# Patient Record
Sex: Female | Born: 1985 | Race: Black or African American | Hispanic: No | Marital: Single | Smoking: Never smoker
Health system: Southern US, Community
[De-identification: ages and names within clinical notes are randomized; demographics above are authoritative.]

## PROBLEM LIST (undated history)

## (undated) ENCOUNTER — Inpatient Hospital Stay (HOSPITAL_COMMUNITY): Payer: Self-pay

## (undated) DIAGNOSIS — R45851 Suicidal ideations: Secondary | ICD-10-CM

## (undated) DIAGNOSIS — T4145XA Adverse effect of unspecified anesthetic, initial encounter: Secondary | ICD-10-CM

## (undated) DIAGNOSIS — Z86718 Personal history of other venous thrombosis and embolism: Secondary | ICD-10-CM

## (undated) DIAGNOSIS — Z9289 Personal history of other medical treatment: Secondary | ICD-10-CM

## (undated) DIAGNOSIS — O139 Gestational [pregnancy-induced] hypertension without significant proteinuria, unspecified trimester: Secondary | ICD-10-CM

## (undated) DIAGNOSIS — N921 Excessive and frequent menstruation with irregular cycle: Secondary | ICD-10-CM

## (undated) DIAGNOSIS — R569 Unspecified convulsions: Secondary | ICD-10-CM

## (undated) DIAGNOSIS — G43909 Migraine, unspecified, not intractable, without status migrainosus: Secondary | ICD-10-CM

## (undated) DIAGNOSIS — N390 Urinary tract infection, site not specified: Secondary | ICD-10-CM

## (undated) DIAGNOSIS — R51 Headache: Secondary | ICD-10-CM

## (undated) DIAGNOSIS — F419 Anxiety disorder, unspecified: Secondary | ICD-10-CM

## (undated) DIAGNOSIS — O1490 Unspecified pre-eclampsia, unspecified trimester: Secondary | ICD-10-CM

## (undated) DIAGNOSIS — D649 Anemia, unspecified: Secondary | ICD-10-CM

## (undated) DIAGNOSIS — B009 Herpesviral infection, unspecified: Secondary | ICD-10-CM

## (undated) DIAGNOSIS — F329 Major depressive disorder, single episode, unspecified: Secondary | ICD-10-CM

## (undated) DIAGNOSIS — N12 Tubulo-interstitial nephritis, not specified as acute or chronic: Secondary | ICD-10-CM

## (undated) DIAGNOSIS — O159 Eclampsia, unspecified as to time period: Secondary | ICD-10-CM

## (undated) DIAGNOSIS — A6000 Herpesviral infection of urogenital system, unspecified: Secondary | ICD-10-CM

## (undated) DIAGNOSIS — F3181 Bipolar II disorder: Secondary | ICD-10-CM

## (undated) DIAGNOSIS — F32A Depression, unspecified: Secondary | ICD-10-CM

## (undated) DIAGNOSIS — D219 Benign neoplasm of connective and other soft tissue, unspecified: Secondary | ICD-10-CM

## (undated) DIAGNOSIS — Z86711 Personal history of pulmonary embolism: Secondary | ICD-10-CM

## (undated) HISTORY — DX: Personal history of pulmonary embolism: Z86.711

## (undated) HISTORY — DX: Unspecified pre-eclampsia, unspecified trimester: O14.90

## (undated) HISTORY — DX: Excessive and frequent menstruation with irregular cycle: N92.1

## (undated) HISTORY — DX: Bipolar II disorder: F31.81

## (undated) HISTORY — DX: Suicidal ideations: R45.851

---
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
## 2010-01-05 HISTORY — PX: INDUCED ABORTION: SHX677

## 2010-02-27 ENCOUNTER — Inpatient Hospital Stay (HOSPITAL_COMMUNITY): Admission: EM | Admit: 2010-02-27 | Discharge: 2010-03-02 | Payer: Self-pay | Admitting: Emergency Medicine

## 2010-02-27 ENCOUNTER — Ambulatory Visit: Payer: Self-pay | Admitting: Family Medicine

## 2010-03-03 ENCOUNTER — Ambulatory Visit: Payer: Self-pay | Admitting: Family Medicine

## 2010-03-03 DIAGNOSIS — I2699 Other pulmonary embolism without acute cor pulmonale: Secondary | ICD-10-CM | POA: Insufficient documentation

## 2010-03-04 ENCOUNTER — Ambulatory Visit: Payer: Self-pay | Admitting: Family Medicine

## 2010-03-04 DIAGNOSIS — O021 Missed abortion: Secondary | ICD-10-CM

## 2010-03-04 DIAGNOSIS — J45909 Unspecified asthma, uncomplicated: Secondary | ICD-10-CM | POA: Insufficient documentation

## 2010-03-04 LAB — CONVERTED CEMR LAB: INR: 1.4

## 2010-03-05 ENCOUNTER — Emergency Department (HOSPITAL_COMMUNITY): Admission: EM | Admit: 2010-03-05 | Discharge: 2010-03-05 | Payer: Self-pay | Admitting: Emergency Medicine

## 2010-03-05 ENCOUNTER — Telehealth: Payer: Self-pay | Admitting: *Deleted

## 2010-03-08 ENCOUNTER — Ambulatory Visit: Payer: Self-pay | Admitting: Family Medicine

## 2010-03-08 ENCOUNTER — Encounter: Payer: Self-pay | Admitting: Family Medicine

## 2010-03-08 LAB — CONVERTED CEMR LAB
INR: 2.1
hCG, Beta Chain, Quant, S: 139.1 milliintl units/mL

## 2010-03-12 ENCOUNTER — Ambulatory Visit: Payer: Self-pay | Admitting: Family Medicine

## 2010-03-15 ENCOUNTER — Ambulatory Visit: Payer: Self-pay | Admitting: Family Medicine

## 2010-03-15 ENCOUNTER — Encounter: Payer: Self-pay | Admitting: Family Medicine

## 2010-03-15 LAB — CONVERTED CEMR LAB: INR: 2.3

## 2010-03-18 ENCOUNTER — Telehealth: Payer: Self-pay | Admitting: Family Medicine

## 2010-04-01 ENCOUNTER — Telehealth: Payer: Self-pay | Admitting: *Deleted

## 2010-04-05 ENCOUNTER — Ambulatory Visit: Payer: Self-pay | Admitting: Family Medicine

## 2010-04-13 ENCOUNTER — Encounter: Payer: Self-pay | Admitting: *Deleted

## 2010-04-14 ENCOUNTER — Telehealth: Payer: Self-pay | Admitting: Family Medicine

## 2010-05-04 ENCOUNTER — Encounter: Payer: Self-pay | Admitting: Family Medicine

## 2010-05-04 ENCOUNTER — Ambulatory Visit: Payer: Self-pay | Admitting: Family Medicine

## 2010-05-04 LAB — CONVERTED CEMR LAB
INR: 1.2
hCG, Beta Chain, Quant, S: <2 milliintl units/mL

## 2010-05-17 DIAGNOSIS — W57XXXA Bitten or stung by nonvenomous insect and other nonvenomous arthropods, initial encounter: Secondary | ICD-10-CM | POA: Insufficient documentation

## 2010-05-17 DIAGNOSIS — S60469A Insect bite (nonvenomous) of unspecified finger, initial encounter: Secondary | ICD-10-CM

## 2010-08-01 ENCOUNTER — Emergency Department (HOSPITAL_COMMUNITY): Admission: EM | Admit: 2010-08-01 | Discharge: 2010-08-01 | Payer: Self-pay | Admitting: Family Medicine

## 2010-08-04 ENCOUNTER — Telehealth: Payer: Self-pay | Admitting: Family Medicine

## 2010-08-04 ENCOUNTER — Ambulatory Visit: Payer: Self-pay | Admitting: Family Medicine

## 2010-08-04 DIAGNOSIS — L03818 Cellulitis of other sites: Secondary | ICD-10-CM

## 2010-08-04 DIAGNOSIS — L02818 Cutaneous abscess of other sites: Secondary | ICD-10-CM

## 2010-08-04 LAB — CONVERTED CEMR LAB: INR: 1.1

## 2010-08-18 ENCOUNTER — Telehealth: Payer: Self-pay | Admitting: *Deleted

## 2010-09-02 ENCOUNTER — Encounter: Payer: Self-pay | Admitting: Family Medicine

## 2010-12-05 DIAGNOSIS — N12 Tubulo-interstitial nephritis, not specified as acute or chronic: Secondary | ICD-10-CM

## 2010-12-05 HISTORY — DX: Tubulo-interstitial nephritis, not specified as acute or chronic: N12

## 2011-01-04 NOTE — Letter (Signed)
Summary: Probation Letter  [facility]  [address]   [place], [place] [address]   Phone: [phone]  Fax: [phone]    04/13/2010  Paula Benson [address] [place], [place]  [address]  Dear Ms. Hanback,  With the goal of better serving all our patients the [facility] is following each patient's missed appointments.  You have missed at least 3 appointments with our practice.If you cannot keep your appointment, we expect you to call at least 24 hours before your appointment time.  Missing appointments prevents other patients from seeing [place] and makes it difficult to provide you with the best possible medical care.      1.   If you miss one more appointment, we will only give you limited medical services. This means we will not call in medication refills, complete a form, or make a referral for you except when you are here for a scheduled office visit.    2.   If you miss 2 or more appointments in the next year, we will dismiss you from our practice.    Our office staff can be reached at [phone] Monday through Friday from 8:30 a.m.-5:00 p.m. and will be glad to schedule your appointment as necessary.    Thank you.   [facility]  Appended Document: Probation Letter Letter returned unclaimed.

## 2011-01-04 NOTE — Assessment & Plan Note (Signed)
Summary: f/i coumadin,df   Vital Signs:  Patient profile:   25 year old female Weight:      140.5 pounds Pulse rate:   80 / minute BP sitting:   104 / 64  (left arm)  Vitals Entered By: Arlyss Repress CMA, (May 04, 2010 2:37 PM) CC: f/up spider bite. refill meds. Is Patient Diabetic? No Pain Assessment Patient in pain? no        Primary Care Provider:  Clementeen Graham MD   CC:  f/up spider bite. refill meds..  History of Present Illness: 25 year old AAF:  1. Spider Bite: > 1 weeks ago, right index finger, went to [facility] - they I&D'd abscess, Rx Doxy, now much better. Denies fever/chills, HA, dizziness, N/V/D, LE edema, swelling/pain in finger, numbness/tingling fingers/hand.  2. Hx PE/Coumadin: Hx of 3 PEs associated with pregnancy, not compliant with coumadin or INR checks, she states that she has been taking her coumadin daily (12.5 mg). She denies CP, SOB, LE edema/pain. She has not been on Lovenox for several weeks.  3. Missed Abortion: s/p elective abortion in February 2011. B-hCG on 03/12/10 = 41.6. Plan - to monitor until < 5. If plateaus, will need D&C. Patient denies vaginal bleeding. Not sexually active now.  Habits & Providers  Alcohol-Tobacco-Diet     Tobacco Status: never  Current Medications (verified): 1)  Lovenox 100 Mg/ml Soln (Enoxaparin Sodium) .Marland Kitchen.. 100 Mg Subcutaneously Qday 2)  Coumadin 5 Mg Tabs (Warfarin Sodium) .Marland Kitchen.. 10 Mg By Mouth Qday 3)  Ventolin Hfa 108 (90 Base) Mcg/act Aers (Albuterol Sulfate) .... 2 Puffs Q 4 Hrs As Needed Dyspnea 4)  Ibuprofen 800 Mg Tabs (Ibuprofen) .... One Tab By Mouth Three Times A Day As Needed Pain  Allergies (verified): No Known Drug Allergies  Past History:  Past medical, surgical, family and social histories (including risk factors) reviewed, and no changes noted (except as noted below).  Past Medical History: Reviewed history from 03/12/2010 and no changes required. - G3P2-0-1-2.  - Asthma.  - Preeclampsia with her  first pregnancy.  - Pulmonary emboli x 3, latest 02/2010.   Past Surgical History: Reviewed history from 03/04/2010 and no changes required. - Cesarean section in January 2003 and February 2009.  - Elective abortion February 2011  Family History: Reviewed history from 03/04/2010 and no changes required. M = DM2 F = in good health Sister = Anemia  No family history of clotting d/o per patient   Social History: Reviewed history from 03/04/2010 and no changes required. Student. Plans on staying her in [place] (originally living in [place]). 2 daughters.   Review of Systems      See HPI  Physical Exam  General:  Well-developed, well-nourished, in no acute distress; alert, appropriate and cooperative throughout examination. Vitals reviewed. Lungs:  Normal respiratory effort, chest expands symmetrically. Lungs are clear to auscultation, no crackles or wheezes. Heart:  Normal rate and regular rhythm. S1 and S2 normal without gallop, murmur, click, rub or other extra sounds. Pulses:  R and L dorsalis pedis and posterior tibial pulses are full and equal bilaterally. Extremities:  No edema. Skin:  Nodule (< 1 cm) palpated on right index finger. No ttp. No edema or erythema. No fluctuance.   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 Improving.  Orders: [facility]- Est  Level 4 ([address])  Problem # 2:  COUMADIN THERAPY (ICD-V58.61) Assessment: Unchanged Reviewed INR and adjusted regimen (see  below). Discussed importance of management. Recommended discussing contraception with PCP. She is not sexually active now. Orders: INR/PT-[facility] (52841) [facility]- Est  Level 4 ([address]40)  Problem # 3:  ABORTION, MISSED (ICD-632) Assessment: Unchanged  Check BHCG.  Orders: [facility]- Est  Level 4 ([address])  Complete Medication List: 1)  Lovenox 100 Mg/ml Soln (Enoxaparin sodium) .Marland Kitchen.. 100 mg subcutaneously qday 2)  Coumadin 5 Mg  Tabs (Warfarin sodium) .Marland Kitchen.. 10 mg by mouth qday 3)  Ventolin Hfa 108 (90 Base) Mcg/act Aers (Albuterol sulfate) .... 2 puffs q 4 hrs as needed dyspnea 4)  Ibuprofen 800 Mg Tabs (Ibuprofen) .... One tab by mouth three times a day as needed pain  Patient Instructions: 1)  It was nice to meet you today! Prescriptions: VENTOLIN HFA 108 (90 BASE) MCG/ACT AERS (ALBUTEROL SULFATE) 2 puffs q 4 hrs as needed dyspnea  #1 x 3   Entered and Authorized by:   Helane Rima DO   Signed by:   Helane Rima DO on 05/04/2010   Method used:   [facility]  [address] [phone]* (retail)       [address]       [place], [place]  [address]       Ph: [phone] or [phone]       Fax: [phone]   RxID:   [id number] COUMADIN 5 MG TABS (WARFARIN SODIUM) 10 mg by mouth qday  #90 x 0   Entered and Authorized by:   Helane Rima DO   Signed by:   Helane Rima DO on 05/04/2010   Method used:   [facility]        [facility]  [address] [phone]* (retail)       [address]       [place], [place]  [address]       Ph: [phone] or [phone]       Fax: [phone]   RxID:   [id number]    ANTICOAGULATION RECORD PREVIOUS REGIMEN & LAB RESULTS Anticoagulation Diagnosis:  Pulmonary Embolism on  03/03/2010 Previous INR Goal Range:  2-3 on  03/03/2010 Previous INR:  1.1 on  04/05/2010 Previous Coumadin Dose(mg):  5mg  tablet on  03/03/2010 Previous Regimen:  12.5mg  M,W,F; 10mg  OTHER DAYS on  04/05/2010 Previous Coagulation Comments:  Pt states she was only taking 10mg  M,W,F; 5mg  other days on  04/05/2010  NEW REGIMEN & LAB RESULTS Current INR: 1.2 Regimen: 12.5 mg daily  Provider: Earlene Plater Repeat testing in: 1 week  05-11-10 Other Comments: ...............test performed by......Marland KitchenBonnie A. [place], MLS (ASCP)cm   Dose has been reviewed with patient or caretaker during this visit. Reviewed by: Dr.  Earlene Plater  Anticoagulation Visit Questionnaire Coumadin dose missed/changed:  Yes Coumadin Dose Comments:  missed 1 dose 2 weeks ago Abnormal Bleeding Symptoms:  No  Any diet changes including alcohol intake, vegetables or greens since the last visit:  No Any illnesses or hospitalizations since the last visit:  Yes      Recent Illness/Hospitalizations:  had spider bite, went to [facility] & was given Doxycycline starting 04-24-10 Any signs of clotting since the last visit (including chest discomfort, dizziness, shortness of breath, arm tingling, slurred speech, swelling or redness in leg):  No  MEDICATIONS LOVENOX 100 MG/ML SOLN (ENOXAPARIN SODIUM) 100 mg Subcutaneously qday COUMADIN 5 MG TABS (WARFARIN SODIUM) 10 mg by mouth qday VENTOLIN  HFA 108 (90 BASE) MCG/ACT AERS (ALBUTEROL SULFATE) 2 puffs q 4 hrs as needed dyspnea IBUPROFEN 800 MG TABS (IBUPROFEN) one tab by mouth three times a day as needed pain    Prevention & Chronic Care Immunizations   Influenza vaccine: Not documented   Influenza vaccine deferral: Not indicated  (05/04/2010)    Tetanus booster: Not documented    Pneumococcal vaccine: Not documented  Other Screening   Pap smear: Not documented   Smoking status: never  (05/04/2010)  Appended Document: f/i coumadin,df     Allergies: No Known Drug Allergies   Impression & Recommendations:  Problem # 1:  FINGER INSECT BITE NONVENOMOUS W/O MENTION INF (ICD-915.4)  Complete Medication List: 1)  Lovenox 100 Mg/ml Soln (Enoxaparin sodium) .Marland Kitchen.. 100 mg subcutaneously qday 2)  Coumadin 5 Mg Tabs (Warfarin sodium) .Marland Kitchen.. 10 mg by mouth qday 3)  Ventolin Hfa 108 (90 Base) Mcg/act Aers (Albuterol sulfate) .... 2 puffs q 4 hrs as needed dyspnea 4)  Ibuprofen 800 Mg Tabs (Ibuprofen) .... One tab by mouth three times a day as needed pain

## 2011-01-04 NOTE — Assessment & Plan Note (Signed)
Summary: f/u boil on ear,df   Vital Signs:  Patient profile:   25 year old female Height:      63 inches Weight:      142.2 pounds BMI:     25.28 Temp:     98.7 degrees F Pulse rate:   83 / minute BP sitting:   117 / 83  (right arm)  Vitals Entered By: Tessie Fass CMA (August 04, 2010 11:34 AM) CC: f/u boil Is Patient Diabetic? No Pain Assessment Patient in pain? no        Primary Care Provider:  Clementeen Graham MD   CC:  f/u boil.  History of Present Illness: 25 year old AAF:  1. Abscess on [place]: a few days ago, left, went to [facility] - they I&D'd abscess, Rx Bactrim, now much better. Denies fever/chills, HA, dizziness, N/V/D, neck pain, lymphadenopathy.  2. Hx PE/Coumadin: Hx of 3 PEs associated with pregnancy, not compliant with coumadin or INR checks, she states that she has been taking her coumadin daily (12.5 mg). She denies CP, SOB, LE edema/pain.    Habits & Providers  Alcohol-Tobacco-Diet     Tobacco Status: never  Current Medications (verified): 1)  Coumadin 5 Mg Tabs (Warfarin Sodium) .Marland Kitchen.. 10 Mg By Mouth Qday 2)  Ventolin Hfa 108 (90 Base) Mcg/act Aers (Albuterol Sulfate) .... 2 Puffs Q 4 Hrs As Needed Dyspnea 3)  Ibuprofen 800 Mg Tabs (Ibuprofen) .... One Tab By Mouth Three Times A Day As Needed Pain 4)  Diflucan 100 Mg Tabs (Fluconazole) .... One By Mouth X 1  Allergies (verified): No Known Drug Allergies  Past History:  Past Medical History: - G3P2-0-1-2.  - Asthma.  - Preeclampsia with her first pregnancy.  - Pulmonary emboli x 3, latest 02/2010.     - NONCOMPLIANT WITH INR CHECKS, MUST LIMIT REFILLS PMH-FH-SH reviewed-no changes except otherwise noted  Review of Systems      See HPI  Physical Exam  General:  Well-developed, well-nourished, in no acute distress; alert, appropriate and cooperative throughout examination. Vitals reviewed. Skin:  Left Ear: healing abscess on left ear lobule - no nodule or fluctuation palpated, nontender, no  drainage, no streaks, no lymphadenopathy.  Psych:  Oriented X3, memory intact for recent and remote, normally interactive, good eye contact, not anxious appearing, and not depressed appearing.     Impression & Recommendations:  Problem # 1:  CELLULITIS AND ABSCESS OF OTHER SPECIFIED SITE [phone]) Assessment Improved  Left ear lobule. Healing well. Finish Abx.  Orders: [facility]- Est  Level 4 (04540)  Problem # 2:  COUMADIN THERAPY (ICD-V58.61) Assessment: Deteriorated Discussed vital importance of regular INR checks at length. INR = 1.1. Patient admitted that she has not been taking coumadin x 1 week, but showed me her bottle (with about 20 more pills inside). Samples given for Lovenox (100 mg) daily x 6 days, instructions to start coumadin 10 mg daily until she comes back on Tuesday (6 days, patient previously on 10 mg po daily and still not at goal but now on Abx). I did NOT give a refill of coumadin and explained that she must prove that she can f/u. Will recheck INR next Tuesday (clinic closed Monday). She will follow up with PCP in 3 weeks.  Orders: [facility]R/PT-[facility] (98119) [facility]- Est  Level 4 ([address]782)  Complete Medication List: 1)  Coumadin 5 Mg Tabs (Warfarin sodium) .Marland Kitchen.. 10 mg by mouth qday 2)  Ventolin Hfa 108 (90 Base) Mcg/act Aers (Albuterol sulfate) .... 2  puffs q 4 hrs as needed dyspnea 3)  Ibuprofen 800 Mg Tabs (Ibuprofen) .... One tab by mouth three times a day as needed pain 4)  Diflucan 100 Mg Tabs (Fluconazole) .... One by mouth x 1 5)  Lovenox 40 Mg/0.70ml Soln (Enoxaparin sodium) .... 1.5 mg/kg x 5 days  Patient Instructions: 1)  It was nice to see you today! 2)  You MUST follow up for your COUMADIN CHECKS!  3)  Your ear is healing well. Continue the antibiotic until it is gone. You may take the diflucan afterwards if you have vaginal itching. 4)  Follow up in 2 WEEKS with your PCP and even sooner if we need to recheck your coumadin level. Prescriptions: DIFLUCAN 100 MG TABS  (FLUCONAZOLE) one by mouth x 1  #1 x 0   Entered and Authorized by:   Helane Rima DO   Signed by:   Helane Rima DO on 08/04/2010   Method used:   [facility]        [facility]  [address] [phone]* (retail)       [address]       [place], [place]  [address]       Ph: [phone] or [phone]       Fax: [phone]   RxID:   [id number]    ANTICOAGULATION RECORD PREVIOUS REGIMEN & LAB RESULTS Anticoagulation Diagnosis:  Pulmonary Embolism on  03/03/2010 Previous INR Goal Range:  2-3 on  03/03/2010 Previous INR:  1.2 on  05/04/2010 Previous Coumadin Dose(mg):  5mg  tablet on  03/03/2010 Previous Regimen:  12.5 mg daily on  05/04/2010 Previous Coagulation Comments:  Pt states she was only taking 10mg  M,W,F; 5mg  other days on  04/05/2010  NEW REGIMEN & LAB RESULTS Current INR: 1.1 Regimen: 10 mg daily + Lovenox   Provider: Earlene Plater Repeat testing in: Tues  08-10-10 Other Comments: ...............test performed by......Marland KitchenBonnie A. [place], MLS (ASCP)cm   Dose has been reviewed with patient or caretaker during this visit. Reviewed by: Dr. Earlene Plater  Anticoagulation Visit Questionnaire Coumadin dose missed/changed:  Yes Coumadin Dose Comments:  pt admits to not taking warfarin for the past week Abnormal Bleeding Symptoms:  No  Any diet changes including alcohol intake, vegetables or greens since the last visit:  No Any illnesses or hospitalizations since the last visit:  No Any signs of clotting since the last visit (including chest discomfort, dizziness, shortness of breath, arm tingling, slurred speech, swelling or redness in leg):  No  MEDICATIONS COUMADIN 5 MG TABS (WARFARIN SODIUM) 10 mg by mouth qday VENTOLIN HFA 108 (90 BASE) MCG/ACT AERS (ALBUTEROL SULFATE) 2 puffs q 4 hrs as needed dyspnea IBUPROFEN 800 MG TABS (IBUPROFEN) one tab by mouth three times a day as needed pain DIFLUCAN 100 MG TABS (FLUCONAZOLE) one by mouth x  1 LOVENOX 40 MG/0.4ML SOLN (ENOXAPARIN SODIUM) 1.5 mg/kg x 5 days

## 2011-01-04 NOTE — Progress Notes (Signed)
Summary: phn msg  Phone Note Call from Patient Call back at Home Phone [phone]   Caller: Patient Summary of Call: needs note faxed to [facility] - [phone] stating the length of break Initial call taken by: De Nurse,  August 04, 2010 2:06 PM

## 2011-01-04 NOTE — Progress Notes (Signed)
Summary: SEE MESSAGE/TS  Phone Note Call from Patient Call back at Home Phone [phone]   Caller: Patient Summary of Call: pt lost the pill for her yeast inf - can another one be called in for DIFLUCAN 100 MG TABS  [facility] [address] [facility] Initial call taken by: De Nurse,  August 18, 2010 8:51 AM  Follow-up for Phone Call        Please call patient to lether know that I resent the Diflucan. HOWEVER, SHE MUST FOLLOW UP WITH HER PCP AND SHOW UP FOR COUMADIN CHECKS. IF SHE CONTINUES TO DNKA, SHE WILL BE DISCHARGED FROM THE PRACTICE. Follow-up by: Helane Rima DO,  August 18, 2010 9:00 AM  Additional Follow-up for Phone Call Additional follow up Details #1::        CALLED PT AND LMVM TO CALL BACK. Additional Follow-up by: Arlyss Repress CMA,,  August 18, 2010 3:45 PM    Additional Follow-up for Phone Call Additional follow up Details #2::    pt has appt 9-26 with dr.corey and also lab appt today. Follow-up by: Arlyss Repress CMA,,  August 19, 2010 11:40 AM  Prescriptions: DIFLUCAN 100 MG TABS (FLUCONAZOLE) one by mouth x 1  #1 x 0   Entered and Authorized by:   Helane Rima DO   Signed by:   Helane Rima DO on 08/18/2010   Method used:   [facility]        [facility]  [address] [phone]* (retail)       [address]       [place], [place]  [address]       Ph: [phone] or [phone]       Fax: [phone]   RxID:   [id number]

## 2011-01-04 NOTE — Progress Notes (Signed)
Summary: refill  Phone Note Refill Request Call back at Home Phone [phone] Message from:  Patient  Refills Requested: Medication #1:  COUMADIN 5 MG TABS 10 mg by mouth qday [facility] [address]   Next Appointment Scheduled: 05/04/10 Initial call taken by: De Nurse,  Apr 14, 2010 9:07 AM  Follow-up for Phone Call        Paula Benson needs to come to clinic for INR checks.  No medications until she calls and keeps a lab check. Follow-up by: Clementeen Graham MD,  Apr 15, 2010 3:23 PM

## 2011-01-04 NOTE — Progress Notes (Signed)
Summary: refill  Phone Note Refill Request Call back at Home Phone [phone] Message from:  Patient  Refills Requested: Medication #1:  COUMADIN 5 MG TABS 10 mg by mouth qday [facility] [address]  Initial call taken by: De Nurse,  April 01, 2010 3:41 PM  Follow-up for Phone Call       Follow-up by: Golden Circle RN,  April 01, 2010 3:44 PM    Prescriptions: COUMADIN 5 MG TABS (WARFARIN SODIUM) 10 mg by mouth qday  #30 x 0   Entered by:   Golden Circle RN   Authorized by:   Clementeen Graham MD   Signed by:   Golden Circle RN on 04/01/2010   Method used:   [facility]ronically to        [facility] [phone]* (retail)       [address]       [place], [place]  [address]       Ph: [phone] or [phone]       Fax: [phone]   RxID:   [id number]

## 2011-01-04 NOTE — Assessment & Plan Note (Signed)
Summary: F/U PE/ CHECK PT/DSL   Vital Signs:  Patient profile:   25 year old female Weight:      150 pounds Temp:     98.4 degrees F Pulse rate:   75 / minute BP sitting:   123 / 77  Vitals Entered By: Jone Baseman CMA (March 04, 2010 2:03 PM) CC: HFU/PTINR Is Patient Diabetic? No Pain Assessment Patient in pain? yes     Location: chest Intensity: 7   Primary Care Provider:  Clementeen Graham MD   CC:  HFU/PTINR.  History of Present Illness:  56 F recent admission to [facility] for PE here for follow up  1) Pulmonary Embolus: Presented to [facility] w/ chest pain, dyspnea, found to have high probability of PE on V/Q scan; prior history of PE x 2. Started on Lovenox 1.5 mg/kg daily, discharged on Coumadin 5 mg. Seen in clinic for INR check on 03/03/10, found to be 1.2, increased to 10 mg daily. INR is 1.4 today (03/04/10). Reports some chest pain which started yesterday, and is slightly better today; reports some dyspnea as well which has improved a lot since yesterday. Patient reports that she has had a work up for hypercoaguability disorders with her last PE but was not told the results.   2) Elevated beta hCG: Patient with history of elective abortion in February 2011, found to have a positive urine pregnancy test and quant hCG of 443 on admission.  (trended down to 330 on day of discharge).  U/S showed no evidence for intrauterine pregnancy with thickened, heterogeneous-appearing endometrium with a residual blood clot or retained products of conception. Patient reports that she has had some bleeding ("like a period") since discharge from the hospital. Denies presyncope, reports dyspnea as above.        Habits & Providers  Alcohol-Tobacco-Diet     Tobacco Status: never  Current Medications (verified): 1)  Lovenox 100 Mg/ml Soln (Enoxaparin Sodium) .Marland Kitchen.. 100 Mg Subcutaneously Qday 2)  Coumadin 5 Mg Tabs (Warfarin Sodium) .Marland Kitchen.. 10 Mg By Mouth Qday 3)  Ventolin Hfa 108 (90 Base) Mcg/act Aers  (Albuterol Sulfate) .... 2 Puffs Q 4 Hrs As Needed Dyspnea 4)  Ibuprofen 800 Mg Tabs (Ibuprofen) .... One Tab By Mouth Three Times A Day As Needed Pain  Allergies (verified): No Known Drug Allergies  Past History:  Past Medical History: - G3P2-0-1-2.  - Asthma.  - Preeclampsia with her first pregnancy.  - Pulmonary emboli x 3.     .   Past Surgical History: - Cesarean section in January 2003 and February 2009.  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 2 daughters. Smoking Status:  never  Physical Exam  General:  overweight, NAD but anxious appearing  Chest Wall:  non tender to palpation, though pain increased directly after exam but patient does not appear to be in pain when distracted  Lungs:  CTAB w/ normal WOB, able to complete sentences easily  Heart:  RRR, no murmurs  Abdomen:  S/NT/ND +BS  Neurologic:  alert & oriented X3.   Skin:  no bruising  Psych:  somewhat anxious appearing    Impression & Recommendations:  Problem # 1:  PULMONARY EMBOLISM (ICD-415.19) Assessment Unchanged Will continue Coumadin at 10 mg. Recheck INR on Monday. Follow w/ PCP on  Tuesday. Percocet, ibuprofen for pain, though I suspect patient's pain may be more related to anxiety (patient reports history of OCD and anxiety). Red flags that would prompt return to care reviewed with patient.  Her updated medication list for this problem includes:    Lovenox 100 Mg/ml Soln (Enoxaparin sodium) .Marland KitchenMarland KitchenMarland KitchenMarland Kitchen 100 mg subcutaneously qday    Coumadin 5 Mg Tabs (Warfarin sodium) .Marland KitchenMarland KitchenMarland KitchenMarland Kitchen 10 mg by mouth qday  Problem # 2:  ABORTION, MISSED (ICD-632) Assessment: Unchanged  Will follow beta hCG (recheck on Monday). D+C if not continued downward trend.   Future Orders: B-HCG Quant-[facility] ([id number]) ...  03/05/2011  Complete Medication List: 1)  Lovenox 100 Mg/ml Soln (Enoxaparin sodium) .Marland Kitchen.. 100 mg subcutaneously qday 2)  Coumadin 5 Mg Tabs (Warfarin sodium) .Marland Kitchen.. 10 mg by mouth qday 3)  Ventolin Hfa 108 (90 Base) Mcg/act Aers (Albuterol sulfate) .... 2 puffs q 4 hrs as needed dyspnea 4)  Ibuprofen 800 Mg Tabs (Ibuprofen) .... One tab by mouth three times a day as needed pain  Patient Instructions: 1)  Continue your Coumadin at 10 mg daily  2)  Continue your Lovenox shot as before.  3)  Take Percocet for chest pain as directed.  4)  Come back on Monday 4th for recheck of your INR. We will also check your hCG then.  5)  Follow up with Dr. Denyse Amass on Tuesday    ANTICOAGULATION RECORD PREVIOUS REGIMEN & LAB RESULTS Anticoagulation Diagnosis:  Pulmonary Embolism on  03/03/2010 Previous INR Goal Range:  2-3 on  03/03/2010 Previous INR:  1.2 on  03/03/2010 Previous Coumadin Dose(mg):  5mg  tablet on  03/03/2010 Previous Regimen:  10mg  Coumadin; 100mg  Lovenox on  03/03/2010  NEW REGIMEN & LAB RESULTS Current INR: 1.4 Regimen: continue 10mg  coumadin daily; Lovenox 100mg  daily  Provider: Dr. Denyse Amass Repeat testing in: 03/08/10 Other Comments: ...........test performed by...........Marland KitchenTerese Door, CMA   Dose has been reviewed with patient or caretaker during this visit. Reviewed by: Dr. Wallene Huh

## 2011-01-04 NOTE — Assessment & Plan Note (Signed)
Summary: cp-hx PE/[place]/Paula Benson   Vital Signs:  Patient profile:   25 year old female Weight:      146 pounds Temp:     99.4 degrees F Pulse rate:   83 / minute BP sitting:   108 / 68  Vitals Entered By: Tessie Fass CMA (March 12, 2010 2:33 PM) CC: chest pain since this am Is Patient Diabetic? No Pain Assessment Patient in pain? yes     Location: chest Intensity: 7   Primary Care Provider:  Clementeen Graham MD   CC:  chest pain since this am.  History of Present Illness: CC: chest pain  please see previous records for interim history.  In short, 25 yo with recent admission with PE (third one in life) and s/p elective abortion in February 2011 who had elevated B hCG on admission to hospital.  D/C B hCG 330, 4 days ago dropped to 193.  Per D/C summary, plan was to follow until <5.  If plateaus, will need D&C.  Chest pain restarted this morning, continues.  hurts midsternal/right chest, shoots up to right neck.  Feels like pressure/tightness "like someone sitting on chest".  When INR was 2.1, chest pain stopped.  Lovenox finished Monday (5days ago).  Chest discomfort started up again today.  INR today was 1.7.  Has been taking Coumadin 10mg  daily.  Pleuritic in nature.  No fevers, chills, coughing, abd pain, nausea.  Has been bleeding since abortion 01/21/2010.  Using a few pads a day, still bleeds with using bathroom.  No skin bruising.  No blood in stool or urine.  Normally has some bleeding from gums.  US showed no IUP, thick/heterogeneous endometrium, possibility of retained POC or blood clot.    No change in leafy greens.  Appetite has decreased with coumadin.  No h/o DVT in legs.  ? Left swelling behind ear.  No viral URI sxs.  Habits & Providers  Alcohol-Tobacco-Diet     Tobacco Status: never  Current Medications (verified): 1)  Lovenox 100 Mg/ml Soln (Enoxaparin Sodium) .Marland Kitchen.. 100 Mg Subcutaneously Qday 2)  Coumadin 5 Mg Tabs (Warfarin Sodium) .Marland Kitchen.. 10 Mg By Mouth Qday 3)   Ventolin Hfa 108 (90 Base) Mcg/act Aers (Albuterol Sulfate) .... 2 Puffs Q 4 Hrs As Needed Dyspnea 4)  Ibuprofen 800 Mg Tabs (Ibuprofen) .... One Tab By Mouth Three Times A Day As Needed Pain  Allergies (verified): No Known Drug Allergies  Past History:  Past Medical History: - G3P2-0-1-2.  - Asthma.  - Preeclampsia with her first pregnancy.  - Pulmonary emboli x 3, latest 02/2010.   Physical Exam  General:  overweight, NAD but anxious appearing  Ears:  small LN behind left ear, tender, mobile. Neck:  No deformities, masses, or tenderness noted. Chest Wall:  No deformities, masses, or tenderness noted. Lungs:  Normal respiratory effort, chest expands symmetrically. Lungs are clear to auscultation, no crackles or wheezes. Heart:  Normal rate and regular rhythm. S1 and S2 normal without gallop, murmur, click, rub or other extra sounds. Extremities:  No clubbing, cyanosis, edema, or deformity noted with normal full range of motion of all joints.  no palp cords or calf pain   Impression & Recommendations:  Problem # 1:  PULMONARY EMBOLISM (ICD-415.19)  concern for developing/evolving PE given INR 1.7 today.  Have recommended restarting lovenox and increase coumadin to 12.5mg  daily until Monday recheck in lab.  Red flags to seek urgent medical care reviewed.  No imaging today as likely would not  change therapy.  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  O2 sat 99% RA, no SOB, cough.  Her updated medication list for this problem includes:    Lovenox 100 Mg/ml Soln (Enoxaparin sodium) .Marland KitchenMarland KitchenMarland KitchenMarland Kitchen 100 mg subcutaneously qday    Coumadin 5 Mg Tabs (Warfarin sodium) .Marland KitchenMarland KitchenMarland KitchenMarland Kitchen 10 mg by mouth qday  Reviewed the following: INR: 2.1 (03/08/2010)    Next Protime: 03/12/10 (dated on 03/08/2010)  Orders: [facility]- Est  Level 4 (99214)Future Orders: INR/PT-[facility] ([id number]) ... 03/16/2011  Problem # 2:  ABORTION, MISSED  (ICD-632) recheck B hCG monday until <5 per GYN.  Pt states will make appt with GYN as well to f/u.  Orders: [facility]- Est  Level 4 (99214)Future Orders: B-HCG Quant-[facility] ([id number]) ... 03/22/2011  Problem # 3:  COUMADIN THERAPY (ICD-V58.61) increase coumadin to 12.5mg  daily, restart lovenox.  pt states has enough to last weekend at home.  RTC Monday for labdraw again. Orders: [facility]- Est  Level 4 (99214)Future Orders: INR/PT-[facility] ([id number]) ... 03/16/2011  Complete Medication List: 1)  Lovenox 100 Mg/ml Soln (Enoxaparin sodium) .Marland Kitchen.. 100 mg subcutaneously qday 2)  Coumadin 5 Mg Tabs (Warfarin sodium) .Marland Kitchen.. 10 mg by mouth qday 3)  Ventolin Hfa 108 (90 Base) Mcg/act Aers (Albuterol sulfate) .... 2 puffs q 4 hrs as needed dyspnea 4)  Ibuprofen 800 Mg Tabs (Ibuprofen) .... One tab by mouth three times a day as needed pain  Patient Instructions: 1)  Please return next week for close follow up. 2)  restart lovenox 100mg  daily 3)  Increase coumadin to 12.5mg  daily 4)  return Monday for INR and B hCG check and to see how you're doing. 5)  reasons to seek urgent medical care - fevers, worsening shortness of breath, worsening chest pain or changing character, palpitations, fingers turning blue.   ANTICOAGULATION RECORD PREVIOUS REGIMEN & LAB RESULTS Anticoagulation Diagnosis:  Pulmonary Embolism on  03/03/2010 Previous INR Goal Range:  2-3 on  03/03/2010 Previous INR:  2.1 on  03/08/2010 Previous Coumadin Dose(mg):  5mg  tablet on  03/03/2010 Previous Regimen:  Lovenox 100mg  03/08/10 then stop; Coumadin 10mg  daily on  03/08/2010  NEW REGIMEN & LAB RESULTS Regimen: Lovenox 100mg  03/08/10 then stop; Coumadin 10mg  daily  (no change)    Appended Document: cp-hx PE/[place]/Paula Benson Forgot to include O2Sat checked in office: 99% on RA

## 2011-01-04 NOTE — Miscellaneous (Signed)
  Clinical Lists Changes  Problems: Changed problem from ASTHMA (ICD-493.90) to ASTHMA, INTERMITTENT (ICD-493.90) 

## 2011-01-04 NOTE — Progress Notes (Signed)
Summary: Triage call  Phone Note Call from Patient   Caller: Patient Summary of Call: Patient called stating that she was having pains on the left side of her chest above her heart.  Rated it as a 9 out of 10.  She sounded slightly SOB.  Given her history of multiple PEs told her hang up and call 911.  Patient did have someone there with her and she said she would comply.   Initial call taken by: Dennison Nancy RN,  March 05, 2010 3:42 PM

## 2011-01-04 NOTE — Progress Notes (Signed)
Summary: triage  Phone Note Call from Patient Call back at Home Phone [phone]   Caller: Patient Summary of Call: Pt experiencing diahrrea and checking on what she can take for it.  Initial call taken by: Clydell Hakim,  March 18, 2010 4:19 PM  Follow-up for Phone Call        wanted to know if she can take immodium. she has had 2 loose stools today. told her ok & to drink plenty of water. denies pain or fever. call back if it continues or she develps pain or fever, she agreed with plan Follow-up by: Golden Circle RN,  March 18, 2010 4:21 PM

## 2011-02-28 LAB — URINALYSIS, ROUTINE W REFLEX MICROSCOPIC
Bilirubin Urine: NEGATIVE
Glucose, UA: NEGATIVE mg/dL
Nitrite: NEGATIVE
Specific Gravity, Urine: 1.022 (ref 1.005–1.030)
Specific Gravity, Urine: 1.024 (ref 1.005–1.030)
Urobilinogen, UA: 0.2 mg/dL (ref 0.0–1.0)
Urobilinogen, UA: 0.2 mg/dL (ref 0.0–1.0)

## 2011-02-28 LAB — BASIC METABOLIC PANEL
BUN: 9 mg/dL (ref 6–23)
CO2: 25 mEq/L (ref 19–32)
CO2: 26 mEq/L (ref 19–32)
Chloride: 105 mEq/L (ref 96–112)
Chloride: 107 mEq/L (ref 96–112)
Creatinine, Ser: 0.8 mg/dL (ref 0.4–1.2)
GFR calc Af Amer: >60 mL/min (ref >60)
Glucose, Bld: 77 mg/dL (ref 70–99)
Potassium: 3.6 mEq/L (ref 3.5–5.1)
Potassium: 3.8 mEq/L (ref 3.5–5.1)
Sodium: 138 mEq/L (ref 135–145)

## 2011-02-28 LAB — CBC
HCT: 30.9 % — ABNORMAL LOW (ref 36.0–46.0)
HCT: 31.3 % — ABNORMAL LOW (ref 36.0–46.0)
Hemoglobin: 10.2 g/dL — ABNORMAL LOW (ref 12.0–15.0)
MCHC: 33 g/dL (ref 30.0–36.0)
MCHC: 33.2 g/dL (ref 30.0–36.0)
MCV: 83.1 fL (ref 78.0–100.0)
MCV: 83.6 fL (ref 78.0–100.0)
RBC: 3.74 MIL/uL — ABNORMAL LOW (ref 3.87–5.11)
RBC: 3.79 MIL/uL — ABNORMAL LOW (ref 3.87–5.11)
RDW: 18.7 % — ABNORMAL HIGH (ref 11.5–15.5)
RDW: 19 % — ABNORMAL HIGH (ref 11.5–15.5)
WBC: 4.9 10*3/uL (ref 4.0–10.5)
WBC: 5.3 10*3/uL (ref 4.0–10.5)
WBC: 5.7 10*3/uL (ref 4.0–10.5)

## 2011-02-28 LAB — DIFFERENTIAL
Eosinophils Relative: 5 % (ref 0–5)
Lymphocytes Relative: 40 % (ref 12–46)
Lymphs Abs: 2.1 10*3/uL (ref 0.7–4.0)
Monocytes Absolute: 0.5 10*3/uL (ref 0.1–1.0)
Monocytes Relative: 10 % (ref 3–12)
Neutro Abs: 2.4 10*3/uL (ref 1.7–7.7)

## 2011-02-28 LAB — URINE MICROSCOPIC-ADD ON

## 2011-02-28 LAB — PROTIME-INR
INR: 1.05 (ref 0.00–1.49)
Prothrombin Time: 13.4 seconds (ref 11.6–15.2)
Prothrombin Time: 14.6 seconds (ref 11.6–15.2)

## 2011-02-28 LAB — D-DIMER, QUANTITATIVE: D-Dimer, Quant: <0.22 ug/mL-FEU (ref 0.00–0.48)

## 2011-02-28 LAB — WET PREP, GENITAL: Trich, Wet Prep: NONE SEEN

## 2011-02-28 LAB — GC/CHLAMYDIA PROBE AMP, GENITAL: Chlamydia, DNA Probe: NEGATIVE

## 2011-02-28 LAB — URINE CULTURE
Colony Count: 75000
Special Requests: NEGATIVE

## 2011-02-28 LAB — APTT: aPTT: 27 seconds (ref 24–37)

## 2011-02-28 LAB — HCG, QUANTITATIVE, PREGNANCY
hCG, Beta Chain, Quant, S: 348 m[IU]/mL — ABNORMAL HIGH (ref <5)
hCG, Beta Chain, Quant, S: 396 m[IU]/mL — ABNORMAL HIGH (ref <5)

## 2011-03-15 ENCOUNTER — Emergency Department (INDEPENDENT_AMBULATORY_CARE_PROVIDER_SITE_OTHER): Payer: 59

## 2011-03-15 ENCOUNTER — Emergency Department (HOSPITAL_BASED_OUTPATIENT_CLINIC_OR_DEPARTMENT_OTHER)
Admission: EM | Admit: 2011-03-15 | Discharge: 2011-03-15 | Disposition: A | Discharge status: Home / Self Care | Payer: 59 | Attending: Emergency Medicine | Admitting: Emergency Medicine

## 2011-03-15 DIAGNOSIS — R0602 Shortness of breath: Secondary | ICD-10-CM

## 2011-03-15 DIAGNOSIS — D649 Anemia, unspecified: Secondary | ICD-10-CM | POA: Insufficient documentation

## 2011-03-15 DIAGNOSIS — R071 Chest pain on breathing: Secondary | ICD-10-CM | POA: Insufficient documentation

## 2011-03-15 DIAGNOSIS — F411 Generalized anxiety disorder: Secondary | ICD-10-CM

## 2011-03-15 DIAGNOSIS — J45909 Unspecified asthma, uncomplicated: Secondary | ICD-10-CM | POA: Insufficient documentation

## 2011-03-15 LAB — DIFFERENTIAL
Basophils Absolute: 0.1 10*3/uL (ref 0.0–0.1)
Eosinophils Relative: 3 % (ref 0–5)
Lymphocytes Relative: 29 % (ref 12–46)
Neutro Abs: 3.8 10*3/uL (ref 1.7–7.7)
Neutrophils Relative %: 58 % (ref 43–77)

## 2011-03-15 LAB — BASIC METABOLIC PANEL
CO2: 21 mEq/L (ref 19–32)
Calcium: 9.7 mg/dL (ref 8.4–10.5)
Chloride: 105 mEq/L (ref 96–112)
GFR calc Af Amer: >60 mL/min (ref >60)
Potassium: 4.3 mEq/L (ref 3.5–5.1)
Sodium: 139 mEq/L (ref 135–145)

## 2011-03-15 LAB — CBC
HCT: 26.6 % — ABNORMAL LOW (ref 36.0–46.0)
RDW: 18.9 % — ABNORMAL HIGH (ref 11.5–15.5)
WBC: 6.6 10*3/uL (ref 4.0–10.5)

## 2011-03-15 LAB — URINALYSIS, ROUTINE W REFLEX MICROSCOPIC
Bilirubin Urine: NEGATIVE
Hgb urine dipstick: NEGATIVE
Specific Gravity, Urine: 1.01 (ref 1.005–1.030)
pH: 7.5 (ref 5.0–8.0)

## 2011-03-15 LAB — PROTIME-INR
INR: 0.99 (ref 0.00–1.49)
Prothrombin Time: 13.3 seconds (ref 11.6–15.2)

## 2011-03-15 MED ORDER — IOHEXOL 350 MG/ML SOLN
80.0000 mL | Freq: Once | INTRAVENOUS | Status: AC | PRN
  Administered 2011-03-15: 80 mL via INTRAVENOUS

## 2011-03-17 ENCOUNTER — Encounter: Payer: Self-pay | Admitting: Family Medicine

## 2011-03-17 ENCOUNTER — Ambulatory Visit (INDEPENDENT_AMBULATORY_CARE_PROVIDER_SITE_OTHER): Payer: 59 | Admitting: Family Medicine

## 2011-03-17 DIAGNOSIS — D509 Iron deficiency anemia, unspecified: Secondary | ICD-10-CM | POA: Insufficient documentation

## 2011-03-17 DIAGNOSIS — Z7901 Long term (current) use of anticoagulants: Secondary | ICD-10-CM

## 2011-03-17 DIAGNOSIS — D649 Anemia, unspecified: Secondary | ICD-10-CM

## 2011-03-17 DIAGNOSIS — Z86711 Personal history of pulmonary embolism: Secondary | ICD-10-CM | POA: Insufficient documentation

## 2011-03-17 MED ORDER — FERROUS SULFATE 325 (65 FE) MG PO TABS: 325.0000 mg | ORAL_TABLET | Freq: Every day | ORAL | Status: DC

## 2011-03-17 MED ORDER — RIVAROXABAN 20 MG PO TABS: 1.0000 | ORAL_TABLET | Freq: Every day | ORAL | Status: DC

## 2011-03-17 NOTE — Patient Instructions (Signed)
Thank you for coming in today. 1) We started a new medicine xaralto take it 1 pill every day at the same time.  Look out for signs of bleeding such as a huge headache that gets worse and is the worst you have ever had. Also look for blood in your stool or black tarry stools.  You have to take the medicine every day.  2) We will work on your iron studies today. Take 1 iron pill every day with food.  3) I will call you with the results.  4) Let me know if the xaralto is too expensive.  5) Come back in 1 month or less. We will spend a lot of time on your anxiety.  6) Bring that FMLA form by.

## 2011-03-17 NOTE — Progress Notes (Signed)
Ms Paula Benson presents to the clinic today for f/u of her visit to the ED.  She was seen for chest pain and worked up for a PE which was negative. Her INR was 1.   1) PE history: Not taking warfarin as it has been some time. Does not like getting the INR checks. Does take the warfarin when she has it. Is agreeable to [name] if she can afford it.   2) Anemia: She was diagnosed with microcytic anemia at the ED. She denies any bloody stools and a hemoccult was negative. She feels OK most of the time. No weakness or syncope. Does not headaches occasionally. Normal regular cycles lasting 5 days. Not heavy bleeding.  Eats ice.   PMH reviewed.   Ros: See HPI. Intentional weight loss. No fevers or chills. Occasional frontal headaches, Notes panic attacks. Some nausea. Otherwise normal.   Exam:  Vs noted.  Gen: Well NAD HEENT: EOMI,  MMM Lungs: CTABL Nl WOB Heart: RRR no MRG Abd: NABS, NT, ND Exts: Non edematous BL  LE. Neg hoffmans sign.

## 2011-03-17 NOTE — Assessment & Plan Note (Signed)
Microcytic type. Hemoccult is negative and vaginal bleeding per history is not excessive.  Plan: Iron studies and retic count. Could be iron deficient vs thalassemia.  Will f/u results. Additionally will rx iron pill. Initially 1x daily for some nausea. Will titrate up as needed.

## 2011-03-17 NOTE — Assessment & Plan Note (Signed)
Paula Benson has a history of 3 PEs. In the past she has been poorly compliant with warfarin therapy as she does not like getting her INR checked. This has been an ongoing issue.  She needs lifelong anticoagulation.  I discussed the risks vs benefits of Rivaroxaban therapy with Paula Benson. She may do better with this medication. As a medicine you take works better than a medicine you do not take.  Currently this medicine is not FDA approved for this use. A recent NEJM article of the EINSTEIN trial showed non inferiority of Rivaroxabin with warfarin in PE (10.1056/[id number]).  I discussed this with Dr. Raymondo Band (PharmD)  I feel this drug may be useful in this patient.   PLAN: After discussion of the risk vs benefits of this medicine (including off FDA use) Paula Benson agrees that it is OK to start.  We will start at 20mg  daily and follow up in 1 month. Red flags for bleeding were reviewed.  If she cannot afford it we will go back to warfarin dosing.

## 2011-03-18 LAB — IRON AND TIBC
Iron: 23 ug/dL — ABNORMAL LOW (ref 42–145)
UIBC: >450 ug/dL

## 2011-03-18 LAB — FERRITIN: Ferritin: 6 ng/mL — ABNORMAL LOW (ref 10–291)

## 2011-03-21 ENCOUNTER — Encounter: Payer: Self-pay | Admitting: Family Medicine

## 2011-03-21 DIAGNOSIS — F410 Panic disorder [episodic paroxysmal anxiety] without agoraphobia: Secondary | ICD-10-CM | POA: Insufficient documentation

## 2011-03-21 DIAGNOSIS — D509 Iron deficiency anemia, unspecified: Secondary | ICD-10-CM

## 2011-03-21 NOTE — Progress Notes (Signed)
Called patient:  1) Iron Deficiency: Noted with total iron low and ferritin at 6. Not able to calculate TIBC or % sat.   2) Panic Attacks: Continues to have anxiety issues. Has appt with Clementeen Graham in early May. Not sure if she can make it to then. No SI/HI. Interested in earlier visit.

## 2011-03-21 NOTE — Assessment & Plan Note (Signed)
Hemoccult neg.  Think due to menstrual blood loss and inadequate dietary repletion. Plan: FeSo4 325mg  1-3 times a day as tolerated.

## 2011-03-21 NOTE — Assessment & Plan Note (Signed)
Not fully characterized yet. This issue was tabled at the last visit by mutual agreement.  Plan: If not able to wait to see Dr. Denyse Amass in May will see work in provider. Would recommend at that time completing workup and if consistent with anxiety/panic disorder prescribing SSRI (Zoloft) + hydroxezine PRN if physician feels is appropriate.

## 2011-04-11 ENCOUNTER — Ambulatory Visit: Payer: 59 | Admitting: Family Medicine

## 2011-05-08 ENCOUNTER — Emergency Department (HOSPITAL_BASED_OUTPATIENT_CLINIC_OR_DEPARTMENT_OTHER)
Admission: EM | Admit: 2011-05-08 | Discharge: 2011-05-08 | Disposition: A | Discharge status: Home / Self Care | Payer: 59 | Attending: Emergency Medicine | Admitting: Emergency Medicine

## 2011-05-08 DIAGNOSIS — J45909 Unspecified asthma, uncomplicated: Secondary | ICD-10-CM | POA: Insufficient documentation

## 2011-05-08 DIAGNOSIS — B3731 Acute candidiasis of vulva and vagina: Secondary | ICD-10-CM | POA: Insufficient documentation

## 2011-05-08 DIAGNOSIS — N390 Urinary tract infection, site not specified: Secondary | ICD-10-CM | POA: Insufficient documentation

## 2011-05-08 DIAGNOSIS — B373 Candidiasis of vulva and vagina: Secondary | ICD-10-CM | POA: Insufficient documentation

## 2011-05-08 LAB — URINALYSIS, ROUTINE W REFLEX MICROSCOPIC
Bilirubin Urine: NEGATIVE
Glucose, UA: NEGATIVE mg/dL
Hgb urine dipstick: NEGATIVE
Protein, ur: NEGATIVE mg/dL
Specific Gravity, Urine: 1.025 (ref 1.005–1.030)
Urobilinogen, UA: 0.2 mg/dL (ref 0.0–1.0)

## 2011-05-08 LAB — URINE MICROSCOPIC-ADD ON

## 2011-05-08 LAB — WET PREP, GENITAL: Trich, Wet Prep: NONE SEEN

## 2011-05-08 LAB — PREGNANCY, URINE: Preg Test, Ur: NEGATIVE

## 2011-05-09 LAB — GC/CHLAMYDIA PROBE AMP, GENITAL
Chlamydia, DNA Probe: NEGATIVE
GC Probe Amp, Genital: NEGATIVE

## 2011-05-24 ENCOUNTER — Encounter: Payer: Self-pay | Admitting: Family Medicine

## 2011-05-24 ENCOUNTER — Ambulatory Visit (INDEPENDENT_AMBULATORY_CARE_PROVIDER_SITE_OTHER): Payer: 59 | Admitting: Family Medicine

## 2011-05-24 VITALS — BP 120/70 | Temp 98.3°F | Ht 64.0 in | Wt 138.0 lb

## 2011-05-24 DIAGNOSIS — F410 Panic disorder [episodic paroxysmal anxiety] without agoraphobia: Secondary | ICD-10-CM

## 2011-05-24 DIAGNOSIS — Z7901 Long term (current) use of anticoagulants: Secondary | ICD-10-CM

## 2011-05-24 DIAGNOSIS — B0229 Other postherpetic nervous system involvement: Secondary | ICD-10-CM

## 2011-05-24 MED ORDER — WARFARIN SODIUM 5 MG PO TABS: ORAL_TABLET | ORAL | Status: DC

## 2011-05-24 MED ORDER — LIDOCAINE 5 % EX OINT: TOPICAL_OINTMENT | CUTANEOUS | Status: DC | PRN

## 2011-05-24 NOTE — Patient Instructions (Addendum)
Thank you for coming in today. Please apply the lidocaine ointment as needed.  Come back in 1 month.  See your psychiatrist about your panic disorder soon.  If you feel like hurting yourself or others please let me know or call 911 or go to the emergency room.  Take warfarin 10 daily and follow up with Kendal Hymen in 1 week.

## 2011-05-26 ENCOUNTER — Encounter: Payer: Self-pay | Admitting: Family Medicine

## 2011-05-26 DIAGNOSIS — Z86711 Personal history of pulmonary embolism: Secondary | ICD-10-CM

## 2011-05-26 DIAGNOSIS — Z7901 Long term (current) use of anticoagulants: Secondary | ICD-10-CM

## 2011-05-26 NOTE — Assessment & Plan Note (Signed)
Feel this issue is more complicated than inially suspected. She likely has multiple psychiatric issues including panic, perhaps GAD, and Bipolar II or depression. I also suspect that she may have a personality disorder based on her inability to adhere to an anticoagulation regimen despite a life threatening medical condition.  She has a Therapist, sports already in [place].  I suggested that she go back to her psychiatrist for further management as I suspect that she is much more complicated simple anxiety disorder.  Red flags ans suicide precautions reviewed. Will follow.

## 2011-05-26 NOTE — Assessment & Plan Note (Addendum)
I am very concerned about this problem with Paula Benson. We have had difficulty adhering to a good anticoagulation regimen. Paula Benson never filled the rivaroxaban nor did she call and let me know this.  Our plan is to resume warfarin as previously. Will start at 10mg  warfarin as that was a dose that had her close to goal in the past. Will check INR in 1 week.  If Paula Benson continues to not show up for INR checks I cannot continue to prescribe this medication. Will follow.

## 2011-05-26 NOTE — Assessment & Plan Note (Signed)
Think this is the result of her primary herpes outbreak. Will try lidocaine ointment PRN. If this continues to be an issue will consider gabapentin and eventually pelvic PT.  Will follow.

## 2011-05-26 NOTE — Progress Notes (Signed)
Ms Seres presents to clinic today for several issues: 1) Herpes: Was diagnosed with a primary herpes outbreak 3 weeks ago. She no longer has any lesions, however she continues to have vaginal pain and tingling. This is exacerbated by wearing thigh underwear, sitting, walking. She feels that she is unable to work at work, and would like disability paperwork to allow her to work from home.  2) Anticoagulation: Never did fill the rivaroxaban as it was too expensive. States she is taking warfarin 10mg . She understands the risk of not following anticoagulation. She has trouble affording docotr's visits, and thus does not go to INR checks often. She has been therapeutic in the past on 10 and 12.5 mg warfarin on alternating days. Feels well. No chest pain or palpitations or SOB.   3) Panic Disorder: Having occasional panic attacks with fear of doing normal activities due to fear of panic attacks. In history Ms Walczyk notes that she has spent 1 week in an inpatient psychiatric hospital for suicidal ideation. While there she was diagnosed with Bipolar II. However she does not believe this diagnosis. Currently she denies any SI/HI, hallucinations and does not express any delusions.   PMH reviewed.  ROS as above otherwise neg  Exam:  Vs noted.  Gen: Well NAD HEENT: EOMI, PERRL, MMM Lungs: CTABL Nl WOB Heart: RRR no MRG Abd: NABS, NT, ND Exts: Non edematous BL  LE Psych: Normal affect. No SI/ HI no hallucinations or delusions expressed.

## 2011-05-27 ENCOUNTER — Telehealth: Payer: Self-pay | Admitting: Family Medicine

## 2011-05-27 NOTE — Telephone Encounter (Signed)
Needs to talk to nurse about extending her FMLA - needs to talk asap

## 2011-05-27 NOTE — Telephone Encounter (Signed)
Called pt back. Unable to leave message 'mailbox is full'. Waiting for call back. Lorenda Hatchet, [name]

## 2011-05-27 NOTE — Telephone Encounter (Signed)
Called pt to find out details. Pt states 'my phone is dying. Call me back in 5 minutes'. Will try. Lorenda Hatchet, [name]

## 2011-05-31 ENCOUNTER — Ambulatory Visit (INDEPENDENT_AMBULATORY_CARE_PROVIDER_SITE_OTHER): Payer: 59 | Admitting: *Deleted

## 2011-05-31 DIAGNOSIS — Z86711 Personal history of pulmonary embolism: Secondary | ICD-10-CM

## 2011-05-31 DIAGNOSIS — Z7901 Long term (current) use of anticoagulants: Secondary | ICD-10-CM

## 2011-06-01 ENCOUNTER — Telehealth: Payer: Self-pay | Admitting: Family Medicine

## 2011-06-01 NOTE — Telephone Encounter (Signed)
Called patient back. She requests that her FMLA paperwork be changed to better reflect what is going on medically.  Will do so at lunch, and fax back.

## 2011-06-01 NOTE — Telephone Encounter (Signed)
Message copied by Rodolph Bong on Wed Jun 01, 2011  9:35 AM ------      Message from: [place], BONNIE      Created: Tue May 31, 2011 11:45 AM       Tayna was very concerned about her FMLA papers and missing work.  She would very much like for you to call her ASAP @ [phone] before filling out the papers. Wants to know if you can put outbreaks are intermittant--- as she cannot afford to be out of work without the [employer] documentation - otherwise will lose job.  Also wondering if it is possible for short term disability -boss asked about this also.      Did not have warfarin Thurs - Fri- Sat due to no money to get med.      States taking now.  Will have her continue 10 mg and recheck 1 week.      Kendal Hymen [phone])

## 2011-06-03 ENCOUNTER — Telehealth: Payer: Self-pay | Admitting: Family Medicine

## 2011-06-03 NOTE — Telephone Encounter (Signed)
Paula Benson has called back about her FMLA papers.  She says they are due ASAP and she is concerned they will be late.  She also though the MD was going to call her back to discuss the paperwork but has not heard back.

## 2011-06-03 NOTE — Telephone Encounter (Signed)
Forward to PCP for review

## 2011-06-04 NOTE — Telephone Encounter (Signed)
Have looked for the paperwork on Thursday and Friday. They are not present. Perhaps her work got the fax number wrong?

## 2011-06-06 ENCOUNTER — Ambulatory Visit: Payer: 59

## 2011-06-06 ENCOUNTER — Telehealth: Payer: Self-pay | Admitting: *Deleted

## 2011-06-06 NOTE — Telephone Encounter (Signed)
Called pt. Pt explained that Dr.Corey said, that he would call her on his lunch break in re: FMLA papers. See message from 06-01-11. He never called her back. I looked through faxes and did not find any FMLA papers that were faxed. Pt said, that she is afraid of loosing her job and does not know what to do. I told the pt, that Dr.Corey is out until 06-13-11. Advised pt to schedule OV asap with one of our doctors to go over her needed FMLA paper and also advised to ALWAYS keep a copy for herself. Pt agreed and will do that. Lorenda Hatchet, [name]

## 2011-06-06 NOTE — Telephone Encounter (Signed)
Called patient and asked her to have the paperwork faxed over.Busick, Rodena Medin

## 2011-06-06 NOTE — Telephone Encounter (Signed)
Forward to Corey for review.  

## 2011-06-06 NOTE — Telephone Encounter (Signed)
Pt returning call, says they are refaxing the paperwork but the deadline was supposed to be 6/28, pt says they told her they have to receive the paperwork today.

## 2011-06-07 NOTE — Telephone Encounter (Signed)
Pt called back, is very concerned about losing her job, scheduled her Thursday with Dr. 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

## 2011-06-09 ENCOUNTER — Ambulatory Visit (INDEPENDENT_AMBULATORY_CARE_PROVIDER_SITE_OTHER): Payer: 59 | Admitting: Family Medicine

## 2011-06-09 ENCOUNTER — Encounter: Payer: Self-pay | Admitting: Family Medicine

## 2011-06-09 DIAGNOSIS — N760 Acute vaginitis: Secondary | ICD-10-CM

## 2011-06-09 DIAGNOSIS — A6000 Herpesviral infection of urogenital system, unspecified: Secondary | ICD-10-CM

## 2011-06-09 DIAGNOSIS — N912 Amenorrhea, unspecified: Secondary | ICD-10-CM

## 2011-06-09 DIAGNOSIS — Z7901 Long term (current) use of anticoagulants: Secondary | ICD-10-CM

## 2011-06-09 DIAGNOSIS — B0229 Other postherpetic nervous system involvement: Secondary | ICD-10-CM

## 2011-06-09 LAB — POCT WET PREP (WET MOUNT): Trichomonas Wet Prep HPF POC: NEGATIVE

## 2011-06-09 NOTE — Progress Notes (Signed)
  Subjective:    Patient ID: Paula Benson, female    DOB: [birth year], 25 y.o.   MRN: [id number]  HPI 1. Herpes. Dx in May this year. Outbreaks x 3 since diagnosis. Causing such severe pain that spurred visits to ED and had to miss work several days. Pain so severe that only comfortable position is lying down with legs spread to prevent contact with clothing and skin. Thinks outbreaks have been associated with beginning of menses. Valtrex prescribed by MD at [facility] center. Completed 10 days initial treatment and now on daily suppressive therapy. No current open lesions or fevers. Does have residual tingling and moderate pain.   Desires pregnancy testing due to unprotected sexual intercourse.  Patient requesting FMLA paperwork to be completed due to missed days at work (unable to sit at her desk due to severity of pain).    Review of Systems Denies fever, oral lesions, odynophagia, dysphagia, visual changes, irregular vaginal bleeding.     Objective:   Physical Exam  Vitals reviewed. Constitutional: She appears well-developed and well-nourished. No distress.  HENT:  Head: Normocephalic and atraumatic.  Nose: Nose normal.  Mouth/Throat: Oropharynx is clear and moist. No oropharyngeal exudate.  Eyes: EOM are normal. Pupils are equal, round, and reactive to light.  Cardiovascular: Normal rate, regular rhythm and normal heart sounds.   No murmur heard. Genitourinary: Uterus normal. No vaginal discharge found.       No ulcers. Small areas induration on bilateral outer labia majora. Moderately TTP. No bleeding or oozing.   Lymphadenopathy:    She has no cervical adenopathy.  Psychiatric: She has a normal mood and affect.          Assessment & Plan:

## 2011-06-09 NOTE — Assessment & Plan Note (Signed)
Pain controlled currently °

## 2011-06-09 NOTE — Assessment & Plan Note (Signed)
To follow up next week for INR.

## 2011-06-09 NOTE — Patient Instructions (Signed)
I will call if your lab tests are not normal. Faxed your FMLA form. Call for appointment as needed.

## 2011-06-09 NOTE — Assessment & Plan Note (Signed)
No active outbreaks. Pain currently controlled. On suppressive therapy daily after 10 day treatment. Warned about unprotected sex and possibility of transmission to partner even with suppressive therapy. Pt was recently tested for other STDs at [facility]. Pregnancy test negative today.   Completed intermittent FMLA paperwork regarding work absence given severity of pain and multiple documented medical visits.

## 2011-06-17 ENCOUNTER — Telehealth: Payer: Self-pay | Admitting: Family Medicine

## 2011-06-17 NOTE — Telephone Encounter (Signed)
Would like to speak with Dr Denyse Amass - has questions about results

## 2011-06-17 NOTE — Telephone Encounter (Signed)
Called back and left a message to call back for questions.

## 2011-06-21 ENCOUNTER — Encounter: Payer: Self-pay | Admitting: Family Medicine

## 2011-06-21 ENCOUNTER — Ambulatory Visit: Payer: 59 | Admitting: *Deleted

## 2011-06-21 ENCOUNTER — Ambulatory Visit (INDEPENDENT_AMBULATORY_CARE_PROVIDER_SITE_OTHER): Payer: 59 | Admitting: Family Medicine

## 2011-06-21 DIAGNOSIS — Z7901 Long term (current) use of anticoagulants: Secondary | ICD-10-CM

## 2011-06-21 DIAGNOSIS — A6000 Herpesviral infection of urogenital system, unspecified: Secondary | ICD-10-CM

## 2011-06-21 DIAGNOSIS — B9689 Other specified bacterial agents as the cause of diseases classified elsewhere: Secondary | ICD-10-CM

## 2011-06-21 DIAGNOSIS — N760 Acute vaginitis: Secondary | ICD-10-CM

## 2011-06-21 DIAGNOSIS — A499 Bacterial infection, unspecified: Secondary | ICD-10-CM

## 2011-06-21 MED ORDER — CLINDAMYCIN PHOSPHATE 2 % VA CREA: 1.0000 | TOPICAL_CREAM | Freq: Every day | VAGINAL | Status: AC

## 2011-06-21 MED ORDER — LIDOCAINE HCL 2 % EX GEL: Freq: Three times a day (TID) | CUTANEOUS | Status: DC | PRN

## 2011-06-21 MED ORDER — VALACYCLOVIR HCL 500 MG PO TABS: 500.0000 mg | ORAL_TABLET | Freq: Every day | ORAL | Status: DC

## 2011-06-21 NOTE — Progress Notes (Signed)
Ms Hirth presents to clinic today to follow up her herpes and her personal history of PEs.   1) Herpes: genital herpes. Having recurrent outbreaks ever since primary outbreak around 2 months ago. Was on suppressive therapy for 2 weeks however as soon as she stopped she had another outbreak. Currently she is in the painful and hypersensitive part of the herpes outbreak. She is having trouble working as it hurts to sit and wear underwear. She tried the lidocaine ointment but it made it feel worse she thinks due to the Vaseline aspect of it.   2) Anticoagulation: Feeling OK. No chest pain, dyspnea or palpitations. Was taking the warfarin on schedule up until 3 days ago when she missed the last three doses. She thinks that the warfarin may give her a headache.   3) Vaginal Discharge: Had a wet prep last week. Her phone number has changed so she never knew the follow up to the visit. She would like to be treated for her BV. Notes thin discharge and odor.  PMH reviewed.  ROS as above otherwise neg  Exam:  Vs noted.  Gen: Well NAD HEENT:  MMM Lungs: CTABL Nl WOB Heart: RRR no MRG Abd: NABS, NT, ND Exts: Non edematous BL  LE GYN: 3 small painful unroofed vesicles on external labia. No crusting or surrounding erythremia.

## 2011-06-21 NOTE — Patient Instructions (Signed)
Thank you for coming in today. Take the valtrex daily to prevent outbreaks.  Use the lidocaine jell to for pain.  Clinadmycin cream nightly x 7 days for BV.  Will check INR today and again in 1 week.  See me again in 1 month.

## 2011-06-21 NOTE — Progress Notes (Unsigned)
  Subjective:    Patient ID: Paula Benson, female    DOB: Jul 01, 1986, 25 y.o.   MRN: [id number]  HPI    Review of Systems     Objective:   Physical Exam        Assessment & Plan:

## 2011-06-22 ENCOUNTER — Encounter: Payer: Self-pay | Admitting: Family Medicine

## 2011-06-22 NOTE — Assessment & Plan Note (Signed)
Was not taking warfarin last 3 days. Therefore will not check INR today. Will encourage continued compliance with medication regimen and will recheck INR in 1 week.

## 2011-06-22 NOTE — Assessment & Plan Note (Signed)
Continues to have frequent recurrent outbreaks. Seems that her herpes worsens when off valtrex. Additionally does not like the ointment part of the lidocaine ointment.  Plan: As intermintent valtrex therapy is not very effective will use daily supressive therapy. Will also use lidocaine jell and follow along in 2 months or so.

## 2011-06-27 ENCOUNTER — Telehealth: Payer: Self-pay | Admitting: Family Medicine

## 2011-06-27 NOTE — Telephone Encounter (Signed)
Wants to be sure that Dr. Denyse Amass received faxed information - request for addidtional information and missing Rx for Bacterial Vaginosis was supposed to be sent to [facility] [address]

## 2011-06-27 NOTE — Telephone Encounter (Signed)
Forward to Dr Corey for review 

## 2011-07-08 ENCOUNTER — Telehealth: Payer: Self-pay | Admitting: Family Medicine

## 2011-07-08 NOTE — Telephone Encounter (Signed)
Paula Benson is very concerned that if the forms are not filled out correctly because she could lose her job.  She is scheduled to come in on Monday morning to talk about this.  She is concerned that with Dr. Denyse Amass not being back in the office until next Thursday, who ever is filling in for him may not know what needs to be done.  She would appreciate a call back today.

## 2011-07-08 NOTE — Telephone Encounter (Signed)
Do you know anything about this paperwork? Paula Benson, Paula Benson

## 2011-07-08 NOTE — Telephone Encounter (Signed)
[employer] is faxing FMLA paperwork and they need Dr. Denyse Amass to be specific with the days she needs to be out of work.

## 2011-07-10 NOTE — Telephone Encounter (Signed)
I have not seen it either. I am not sure her insurance agency has the right fax number. Can we call the patient and confirm this?

## 2011-07-11 ENCOUNTER — Ambulatory Visit: Payer: 59

## 2011-07-12 NOTE — Telephone Encounter (Signed)
Paperwork is in Dr [employer] - fax was dated 8/3 She needs this today or she will lose her job.  She is very upset.

## 2011-07-12 NOTE — Telephone Encounter (Signed)
Form faxed

## 2011-07-12 NOTE — Telephone Encounter (Signed)
Left message for patient to return call. Dr Denyse Amass has not received any paperwork. Please ask her to make sure that her insurance company has the correct fax number.Busick, Rodena Medin

## 2011-07-27 ENCOUNTER — Encounter: Payer: Self-pay | Admitting: Family Medicine

## 2011-07-27 ENCOUNTER — Ambulatory Visit (INDEPENDENT_AMBULATORY_CARE_PROVIDER_SITE_OTHER): Payer: 59 | Admitting: Family Medicine

## 2011-07-27 DIAGNOSIS — A6000 Herpesviral infection of urogenital system, unspecified: Secondary | ICD-10-CM

## 2011-07-28 NOTE — Assessment & Plan Note (Signed)
I called pt pcp who states that she was out for these days but that he has not had formed faxed to him, form not in his box at time of office visit.  Gave pt rx stating that she can return to work since her herpes outbreak is resolved.  Pt to obtain form and send it to Dr. Denyse Amass or myself for completion.

## 2011-07-28 NOTE — Progress Notes (Signed)
  Subjective:    Patient ID: Paula Benson, female    DOB: [birth year], 25 y.o.   MRN: [id number]  HPI Release to go back to work: Pt states that she needs a particular sign that was faxed over by her office that shows she was given md permission to be out July 17th Until august 22nd.  She states that this form was faxed to [place].  Also requests a form that states she can go back to work with no restrictions.   She states that she was out of work during this period due to severe herpes outbreak and that Dr. Denyse Amass was the one that cared for her during this time and gave her permission to miss work.    Review of Systems     Objective:   Physical Exam  Constitutional: She appears well-developed and well-nourished.  Cardiovascular: Normal rate.   Pulmonary/Chest: Effort normal. No respiratory distress.          Assessment & Plan:

## 2011-07-29 ENCOUNTER — Telehealth: Payer: Self-pay | Admitting: *Deleted

## 2011-07-29 NOTE — Telephone Encounter (Signed)
Form completed.  Reviewed the form with pt. Over the phone.  CMA will fax to requested company and will put form up from in envelope for pt pickup.

## 2011-07-29 NOTE — Telephone Encounter (Signed)
Done. Faxed. Up front. Lorenda Hatchet, [name]

## 2011-07-29 NOTE — Telephone Encounter (Signed)
Called pt. Pt needs her form filled out. ASAP. Pt reports, that she has had so many problems about this and with this. She does not want to loose her job. Dr.Corey is on vacation next week, will ask Dr.Caviness to take care of it. See previous OV with Dr.Caviness. Dr.C. PLEASE FILL OUT FORM. THANKS.   Form is in your box. Paula Benson, [name]

## 2011-08-10 ENCOUNTER — Ambulatory Visit (INDEPENDENT_AMBULATORY_CARE_PROVIDER_SITE_OTHER): Payer: 59 | Admitting: Family Medicine

## 2011-08-10 ENCOUNTER — Encounter: Payer: Self-pay | Admitting: Family Medicine

## 2011-08-10 VITALS — BP 102/63 | HR 91 | Temp 99.1°F | Ht 63.0 in | Wt 142.0 lb

## 2011-08-10 DIAGNOSIS — N12 Tubulo-interstitial nephritis, not specified as acute or chronic: Secondary | ICD-10-CM

## 2011-08-10 DIAGNOSIS — R300 Dysuria: Secondary | ICD-10-CM

## 2011-08-10 DIAGNOSIS — N3000 Acute cystitis without hematuria: Secondary | ICD-10-CM

## 2011-08-10 LAB — POCT URINALYSIS DIPSTICK
Nitrite, UA: POSITIVE
Protein, UA: 100
Urobilinogen, UA: 0.2
pH, UA: 5.5

## 2011-08-10 MED ORDER — CEFTRIAXONE SODIUM 500 MG IJ SOLR
1000.0000 mg | Freq: Once | INTRAMUSCULAR | Status: AC
  Administered 2011-08-10: 1000 mg via INTRAMUSCULAR

## 2011-08-10 MED ORDER — CEPHALEXIN 500 MG PO CAPS: 500.0000 mg | ORAL_CAPSULE | Freq: Three times a day (TID) | ORAL | Status: DC

## 2011-08-10 NOTE — Patient Instructions (Signed)
Place urinary tract infection patient instructions here.

## 2011-08-10 NOTE — Progress Notes (Signed)
  Subjective:    Paula Benson is a 25 y.o. female who complains of dysuria, hesitancy, inability to void, urgency and flank pain bilaterally, and fever. She has had symptoms for 3 days. Patient also complains of back pain and fever. Patient denies vaginal discharge. Patient does have a history of recurrent UTI. Patient does not have a history of pyelonephritis.   The following portions of the patient's history were reviewed and updated as appropriate: allergies, current medications, past family history, past medical history, past social history, past surgical history and problem list.  Review of Systems Constitutional: positive for chills, fatigue and fevers, negative for nothing Genitourinary:positive for dysuria, frequency and hesitancy, negative for sexual problems and vaginal discharge, hematuria and urinary incontinence    Objective:    General appearance: alert, cooperative and mild distress Back: CVAT Abdomen: soft, non-tender; bowel sounds normal; no masses,  no organomegaly  Laboratory:  Urine dipstick: 3+ for hemoglobin, 2+ for leukocyte esterase and 3+ for nitrites.   Micro exam: not done.    Assessment:    Acute cystitis and Pyelonephritis     Plan:    Medications: Keflex, 2 grams of rocephin IM. Maintain adequate hydration. Follow up if symptoms not improving, and as needed.

## 2011-08-13 LAB — URINE CULTURE

## 2011-08-15 ENCOUNTER — Telehealth: Payer: Self-pay | Admitting: Family Medicine

## 2011-08-15 NOTE — Telephone Encounter (Signed)
Discussed Urine Culture Sensitivities with patient. No yeast infection. Doing well.

## 2011-08-17 ENCOUNTER — Encounter: Payer: Self-pay | Admitting: Family Medicine

## 2011-08-17 ENCOUNTER — Telehealth: Payer: Self-pay | Admitting: Family Medicine

## 2011-08-17 NOTE — Telephone Encounter (Signed)
Pt called to say that the note she brought to work, needs to say no restrictions or that she needs to go to bathroom frequently due to uti.   pls fax to - [phone] attn: Irene Pap Needs to have this by 1 pm today -

## 2011-08-23 ENCOUNTER — Ambulatory Visit: Payer: 59 | Admitting: Family Medicine

## 2011-08-25 ENCOUNTER — Encounter: Payer: Self-pay | Admitting: Family Medicine

## 2011-08-25 ENCOUNTER — Ambulatory Visit (INDEPENDENT_AMBULATORY_CARE_PROVIDER_SITE_OTHER): Payer: 59 | Admitting: Family Medicine

## 2011-08-25 VITALS — BP 102/86 | HR 76 | Temp 97.7°F | Ht 64.0 in | Wt 146.0 lb

## 2011-08-25 DIAGNOSIS — R300 Dysuria: Secondary | ICD-10-CM

## 2011-08-25 DIAGNOSIS — R10A0 Flank pain, unspecified side: Secondary | ICD-10-CM

## 2011-08-25 DIAGNOSIS — N390 Urinary tract infection, site not specified: Secondary | ICD-10-CM

## 2011-08-25 DIAGNOSIS — R109 Unspecified abdominal pain: Secondary | ICD-10-CM

## 2011-08-25 DIAGNOSIS — Z309 Encounter for contraceptive management, unspecified: Secondary | ICD-10-CM

## 2011-08-25 DIAGNOSIS — Z23 Encounter for immunization: Secondary | ICD-10-CM

## 2011-08-25 LAB — POCT URINALYSIS DIPSTICK
Bilirubin, UA: NEGATIVE
Ketones, UA: NEGATIVE
Spec Grav, UA: 1.025
pH, UA: 5.5

## 2011-08-25 LAB — POCT UA - MICROSCOPIC ONLY

## 2011-08-25 MED ORDER — NITROFURANTOIN MONOHYD MACRO 100 MG PO CAPS
100.0000 mg | ORAL_CAPSULE | Freq: Two times a day (BID) | ORAL | Status: AC
Start: 2011-08-25 — End: 2011-08-31

## 2011-08-25 MED ORDER — PHENAZOPYRIDINE HCL 200 MG PO TABS: 200.0000 mg | ORAL_TABLET | Freq: Three times a day (TID) | ORAL | Status: DC | PRN

## 2011-08-25 NOTE — Progress Notes (Signed)
Addended by: [place], Osiel Stick on: 08/25/2011 02:36 PM   Modules accepted: Orders

## 2011-08-25 NOTE — Progress Notes (Signed)
  Subjective:    Paula Benson is a 25 y.o. female who complains of burning with urination. She has had symptoms for 1 week. Patient also complains of back pain and headache. Patient denies fever and vaginal discharge. Patient does have a history of recurrent UTI. Patient does have a history of pyelonephritis.   The following portions of the patient's history were reviewed and updated as appropriate: allergies, current medications, past family history, past medical history, past social history, past surgical history and problem list.  Review of Systems Pertinent items are noted in HPI.    Objective:    General appearance: alert and cooperative Back: symmetric, no curvature. ROM normal. No CVA tenderness. Abdomen: soft, non-tender; bowel sounds normal; no masses,  no organomegaly  Laboratory:  Urine dipstick: 2+ for hemoglobin and 2+ for leukocyte esterase.   Micro exam: 3+ WBCs per HPF and 3++ bacteria.  Cocci   Assessment:    UTI     Plan:    Medications: nitrofurantoin and Phenazopyrdium. Maintain adequate hydration. Follow up if symptoms not improving, and as needed.  Urine culture -last two cultures reviewed - sensitive to nitrofurantoin

## 2011-08-25 NOTE — Patient Instructions (Signed)
Please follow up with your PCP to discuss your other health issues.  I will call you when I get the results of your Urine Culture back.

## 2011-08-29 LAB — URINE CULTURE: Colony Count: >=100000

## 2011-09-06 ENCOUNTER — Ambulatory Visit: Payer: 59 | Admitting: Family Medicine

## 2011-11-04 ENCOUNTER — Inpatient Hospital Stay (HOSPITAL_COMMUNITY)
Admission: AD | Admit: 2011-11-04 | Discharge: 2011-11-04 | Disposition: A | Discharge status: Home / Self Care | Payer: Medicaid Other | Source: Ambulatory Visit | Attending: Obstetrics & Gynecology | Admitting: Obstetrics & Gynecology

## 2011-11-04 ENCOUNTER — Inpatient Hospital Stay (HOSPITAL_COMMUNITY): Payer: Medicaid Other

## 2011-11-04 ENCOUNTER — Encounter (HOSPITAL_COMMUNITY): Payer: Self-pay | Admitting: *Deleted

## 2011-11-04 DIAGNOSIS — D649 Anemia, unspecified: Secondary | ICD-10-CM | POA: Insufficient documentation

## 2011-11-04 DIAGNOSIS — N390 Urinary tract infection, site not specified: Secondary | ICD-10-CM

## 2011-11-04 DIAGNOSIS — Z86711 Personal history of pulmonary embolism: Secondary | ICD-10-CM

## 2011-11-04 DIAGNOSIS — O4690 Antepartum hemorrhage, unspecified, unspecified trimester: Secondary | ICD-10-CM

## 2011-11-04 DIAGNOSIS — O209 Hemorrhage in early pregnancy, unspecified: Secondary | ICD-10-CM | POA: Insufficient documentation

## 2011-11-04 DIAGNOSIS — O99019 Anemia complicating pregnancy, unspecified trimester: Secondary | ICD-10-CM | POA: Insufficient documentation

## 2011-11-04 HISTORY — DX: Personal history of other venous thrombosis and embolism: Z86.718

## 2011-11-04 HISTORY — DX: Herpesviral infection, unspecified: B00.9

## 2011-11-04 HISTORY — DX: Urinary tract infection, site not specified: N39.0

## 2011-11-04 HISTORY — DX: Tubulo-interstitial nephritis, not specified as acute or chronic: N12

## 2011-11-04 HISTORY — DX: Anxiety disorder, unspecified: F41.9

## 2011-11-04 HISTORY — DX: Headache: R51

## 2011-11-04 HISTORY — DX: Adverse effect of unspecified anesthetic, initial encounter: T41.45XA

## 2011-11-04 HISTORY — DX: Anemia, unspecified: D64.9

## 2011-11-04 HISTORY — DX: Gestational (pregnancy-induced) hypertension without significant proteinuria, unspecified trimester: O13.9

## 2011-11-04 LAB — WET PREP, GENITAL: Trich, Wet Prep: NONE SEEN

## 2011-11-04 LAB — CBC
HCT: 26.7 % — ABNORMAL LOW (ref 36.0–46.0)
Hemoglobin: 8 g/dL — ABNORMAL LOW (ref 12.0–15.0)
MCH: 20.7 pg — ABNORMAL LOW (ref 26.0–34.0)
MCHC: 30 g/dL (ref 30.0–36.0)
MCV: 69.2 fL — ABNORMAL LOW (ref 78.0–100.0)
RBC: 3.86 MIL/uL — ABNORMAL LOW (ref 3.87–5.11)

## 2011-11-04 LAB — URINALYSIS, ROUTINE W REFLEX MICROSCOPIC
Bilirubin Urine: NEGATIVE
Ketones, ur: NEGATIVE mg/dL
Specific Gravity, Urine: >1.03 — ABNORMAL HIGH (ref 1.005–1.030)
Urobilinogen, UA: 1 mg/dL (ref 0.0–1.0)

## 2011-11-04 LAB — PROTIME-INR: INR: 1.2 (ref 0.00–1.49)

## 2011-11-04 LAB — URINE MICROSCOPIC-ADD ON

## 2011-11-04 LAB — ABO/RH: ABO/RH(D): O POS

## 2011-11-04 LAB — GLUCOSE, CAPILLARY: Glucose-Capillary: 139 mg/dL — ABNORMAL HIGH (ref 70–99)

## 2011-11-04 MED ORDER — PHENAZOPYRIDINE HCL 200 MG PO TABS: 200.0000 mg | ORAL_TABLET | Freq: Three times a day (TID) | ORAL | Status: DC | PRN

## 2011-11-04 MED ORDER — ONDANSETRON 4 MG PO TBDP
4.0000 mg | ORAL_TABLET | Freq: Once | ORAL | Status: AC
  Administered 2011-11-04: 4 mg via ORAL
  Filled 2011-11-04: qty 1

## 2011-11-04 MED ORDER — PRENATAL RX 60-1 MG PO TABS: 1.0000 | ORAL_TABLET | Freq: Every day | ORAL | Status: DC

## 2011-11-04 MED ORDER — PROMETHAZINE HCL 25 MG PO TABS: 12.5000 mg | ORAL_TABLET | Freq: Four times a day (QID) | ORAL | Status: DC | PRN

## 2011-11-04 MED ORDER — ACETAMINOPHEN 500 MG PO TABS
1000.0000 mg | ORAL_TABLET | Freq: Once | ORAL | Status: AC
  Administered 2011-11-04: 1000 mg via ORAL
  Filled 2011-11-04: qty 2

## 2011-11-04 MED ORDER — ENOXAPARIN SODIUM 150 MG/ML ~~LOC~~ SOLN: 1.0000 mg/kg | Freq: Two times a day (BID) | SUBCUTANEOUS | Status: DC

## 2011-11-04 NOTE — ED Notes (Signed)
Has not filled rx for  Coumadin, received new rx last wk when at [facility].  Has NOT been taking for approx 10day. Still has 2-3 days of macrodantin.

## 2011-11-04 NOTE — ED Notes (Signed)
Pt diagnosised last Thurs (22) with UTI, not taking meds as ordered.  Took pyridium and macrobid yesterday

## 2011-11-04 NOTE — Progress Notes (Signed)
Had been dx last wk at [facility] with UTI, +UPT x2 last Fri.  Has been running fever off and on past week. Nausea started last night.  When got up this morning and went to the bathroom, passed a small clot,no further bleeding noted.

## 2011-11-04 NOTE — ED Provider Notes (Signed)
Paula Benson [id number] Chief Complaint  Patient presents with  . Vaginal Bleeding    SUBJECTIVE  HPI: Presents with first episode VB since pos HPT. Passed small clot after intercourse early this am and no bleeding since. Mild suprapubic cramping and low back pain. On abx for UTI dx at [facility] 2 wks ago. Presented there with SOB, CP and had chest CT (neg for PE) and UPT (neg). Taking coumadin 5 mg bid but had INR 2 wks ago and was subtherapeutic after missing some doses. Has been having a malodorous vaginal discharge but denies itching or irritation.  Past Medical History  Diagnosis Date  . Asthma   . 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 Preeclampsia     with first pregnancy  . Bipolar II disorder   . Suicidal ideations   . Complication of anesthesia     aspirated during first c/s  . Headache   . Pregnancy induced hypertension     seized 2wks after  . History of blood clots     x3  . Anemia   . Urinary tract infection   . Pyelonephritis   . Herpes   . Anxiety    Past Surgical History  Procedure Date  . Cesarean section 12/2001 and 01/2008  . Induced abortion 01/2010   History   Social History  . Marital Status: Single    Spouse Name: N/A    Number of Children: N/A  . Years of Education: N/A   Occupational History  . Not on file.   Social History Main Topics  . Smoking status: Never Smoker   . Smokeless tobacco: Never Used  . Alcohol Use: Yes  . Drug Use: No  . Sexually Active: Yes    Birth Control/ Protection: Condom   Other Topics Concern  . Not on file   Social History Narrative   Student. Plans on staying here in [place] (originally living in [place]). 2 daughters.   No current facility-administered medications on file prior to encounter.   Current Outpatient Prescriptions on File Prior to Encounter  Medication Sig Dispense Refill  . albuterol (VENTOLIN HFA) 108  (90 BASE) MCG/ACT inhaler Inhale 2 puffs into the lungs every 4 (four) hours as needed. For dyspnea.      . phenazopyridine (PYRIDIUM) 200 MG tablet Take 1 tablet (200 mg total) by mouth 3 (three) times daily as needed for pain.  21 tablet  0  . valACYclovir (VALTREX) 500 MG tablet Take 1 tablet (500 mg total) by mouth daily.  30 tablet  1  . warfarin (COUMADIN) 5 MG tablet 2 pills daily by mouth  60 tablet  0   No Known Allergies  ROS: Pertinent items in HPI  OBJECTIVE  BP 135/77  Pulse 115  Temp(Src) 99.7 F (37.6 C) (Oral)  Resp 20  LMP 09/26/2011   Physical Exam  Constitutional: She is well-developed, well-nourished, and in no distress. No distress.  HENT:  Head: Normocephalic.  Cardiovascular: Normal rate.   Pulmonary/Chest: Effort normal.  Abdominal: Soft. There is no tenderness.  Genitourinary: Vagina normal and cervix normal.       Scant brown discharge in vault. Cx clean, no blood from os. VE: LC/H/ ULNS     MAU course: Nauseated -> Zofran given.  [place]: [redacted]w[redacted]d IUGS with YS, no fetal pole, adnexae nl  Results for orders placed during the hospital encounter of 11/04/11 (from the  past 24 hour(s))  URINALYSIS, ROUTINE W REFLEX MICROSCOPIC     Status: Abnormal   Collection Time   11/04/11  9:05 AM      Component Value Range   Color, Urine AMBER (*) YELLOW    APPearance CLEAR  CLEAR    Specific Gravity, Urine >1.030 (*) 1.005 - 1.030    pH 5.5  5.0 - 8.0    Glucose, UA 100 (*) NEGATIVE (mg/dL)   Hgb urine dipstick TRACE (*) NEGATIVE    Bilirubin Urine NEGATIVE  NEGATIVE    Ketones, ur NEGATIVE  NEGATIVE (mg/dL)   Protein, ur 30 (*) NEGATIVE (mg/dL)   Urobilinogen, UA 1.0  0.0 - 1.0 (mg/dL)   Nitrite POSITIVE (*) NEGATIVE    Leukocytes, UA TRACE (*) NEGATIVE   URINE MICROSCOPIC-ADD ON     Status: Abnormal   Collection Time   11/04/11  9:05 AM      Component Value Range   Squamous Epithelial / LPF FEW (*) RARE    WBC, UA 7-10  <3 (WBC/hpf)   RBC / HPF 7-10  <3  (RBC/hpf)   Bacteria, UA RARE  RARE    Urine-Other MUCOUS PRESENT    POCT PREGNANCY, URINE     Status: Normal   Collection Time   11/04/11  9:16 AM      Component Value Range   Preg Test, Ur POSITIVE    CBC     Status: Abnormal   Collection Time   11/04/11  9:30 AM      Component Value Range   WBC 12.2 (*) 4.0 - 10.5 (K/uL)   RBC 3.86 (*) 3.87 - 5.11 (MIL/uL)   Hemoglobin 8.0 (*) 12.0 - 15.0 (g/dL)   HCT 16.1 (*) 09.6 - 46.0 (%)   MCV 69.2 (*) 78.0 - 100.0 (fL)   MCH 20.7 (*) 26.0 - 34.0 (pg)   MCHC 30.0  30.0 - 36.0 (g/dL)   RDW 04.5 (*) 40.9 - 15.5 (%)   Platelets 533 (*) 150 - 400 (K/uL)  ABO/RH     Status: Normal   Collection Time   11/04/11  9:30 AM      Component Value Range   ABO/RH(D) O POS    HCG, QUANTITATIVE, PREGNANCY     Status: Abnormal   Collection Time   11/04/11  9:30 AM      Component Value Range   hCG, Beta Chain, Quant, S 9718 (*) <5 (mIU/mL)  PROTIME-INR     Status: Abnormal   Collection Time   11/04/11  9:30 AM      Component Value Range   Prothrombin Time 15.5 (*) 11.6 - 15.2 (seconds)   INR 1.20  0.00 - 1.49   WET PREP, GENITAL     Status: Abnormal   Collection Time   11/04/11  9:31 AM      Component Value Range   Yeast, Wet Prep NONE SEEN  NONE SEEN    Trich, Wet Prep NONE SEEN  NONE SEEN    Clue Cells, Wet Prep FEW (*) NONE SEEN    WBC, Wet Prep HPF POC NONE SEEN  NONE SEEN    CBG = 139   ASSESSMENT  [id number] with IUP, undetermined viability [redacted]W[redacted]d Bleeding in pregnancy Hx PE x3, ? Etiology, subtherapeutic on coumadin Severe Anemia Nausea/vomiting of pregnancy Hyperglycemia Hx. eclampsia   PLAN D/W Dr. Shawnie Pons. Since subtherapeutic on coumadin, start Lovenox today at 1mg /kg bid  Rx Lovenox 60 mg IM bid PNV, iron, phenergan Return  in 1 wk for viability scan, then F/U at [facility]. Will try to get records Rec no added sweets. Further eval in [facility] if viability established

## 2011-11-04 NOTE — Progress Notes (Signed)
Had not been taking macrobid because was nauseous.  After intercourse started bleeding.

## 2011-11-04 NOTE — ED Notes (Signed)
Back from [place], waiting on report.  Pt states feels like fever is up... Temp checked, pt asking for something for nausea

## 2011-11-04 NOTE — ED Notes (Signed)
Crackers and fluids given. Waiting on CNM review

## 2011-11-05 LAB — GC/CHLAMYDIA PROBE AMP, GENITAL
Chlamydia, DNA Probe: NEGATIVE
GC Probe Amp, Genital: NEGATIVE

## 2011-11-07 ENCOUNTER — Telehealth: Payer: Self-pay | Admitting: Family Medicine

## 2011-11-07 NOTE — Telephone Encounter (Signed)
Called and advised to be seen in clinic ASAP for starting on Lovenox and referral to [facility].  She will do so.

## 2011-12-12 ENCOUNTER — Encounter: Payer: Self-pay | Admitting: Family Medicine

## 2011-12-12 ENCOUNTER — Other Ambulatory Visit: Payer: Self-pay

## 2011-12-12 ENCOUNTER — Ambulatory Visit (INDEPENDENT_AMBULATORY_CARE_PROVIDER_SITE_OTHER): Payer: Self-pay | Admitting: Family Medicine

## 2011-12-12 VITALS — BP 123/78 | HR 92 | Temp 98.5°F | Ht 64.0 in | Wt 153.0 lb

## 2011-12-12 DIAGNOSIS — A6000 Herpesviral infection of urogenital system, unspecified: Secondary | ICD-10-CM

## 2011-12-12 DIAGNOSIS — O0990 Supervision of high risk pregnancy, unspecified, unspecified trimester: Secondary | ICD-10-CM | POA: Insufficient documentation

## 2011-12-12 DIAGNOSIS — A499 Bacterial infection, unspecified: Secondary | ICD-10-CM

## 2011-12-12 DIAGNOSIS — N760 Acute vaginitis: Secondary | ICD-10-CM

## 2011-12-12 DIAGNOSIS — Z86711 Personal history of pulmonary embolism: Secondary | ICD-10-CM

## 2011-12-12 DIAGNOSIS — A609 Anogenital herpesviral infection, unspecified: Secondary | ICD-10-CM

## 2011-12-12 DIAGNOSIS — B9689 Other specified bacterial agents as the cause of diseases classified elsewhere: Secondary | ICD-10-CM

## 2011-12-12 DIAGNOSIS — N730 Acute parametritis and pelvic cellulitis: Secondary | ICD-10-CM | POA: Insufficient documentation

## 2011-12-12 DIAGNOSIS — I2699 Other pulmonary embolism without acute cor pulmonale: Secondary | ICD-10-CM

## 2011-12-12 DIAGNOSIS — Z331 Pregnant state, incidental: Secondary | ICD-10-CM

## 2011-12-12 DIAGNOSIS — B009 Herpesviral infection, unspecified: Secondary | ICD-10-CM

## 2011-12-12 LAB — POCT WET PREP (WET MOUNT)

## 2011-12-12 MED ORDER — METRONIDAZOLE 250 MG PO TABS: 250.0000 mg | ORAL_TABLET | Freq: Three times a day (TID) | ORAL | Status: AC

## 2011-12-12 MED ORDER — AZITHROMYCIN 1 G PO PACK
1.0000 g | PACK | Freq: Once | ORAL | Status: AC
  Administered 2011-12-12: 1 g via ORAL

## 2011-12-12 MED ORDER — ENOXAPARIN SODIUM 30 MG/0.3ML ~~LOC~~ SOLN: 1.5000 mg/kg | SUBCUTANEOUS | Status: DC

## 2011-12-12 MED ORDER — CEFTRIAXONE SODIUM 250 MG IJ SOLR
250.0000 mg | Freq: Once | INTRAMUSCULAR | Status: AC
  Administered 2011-12-12: 250 mg via INTRAMUSCULAR

## 2011-12-12 MED ORDER — VALACYCLOVIR HCL 500 MG PO TABS: 500.0000 mg | ORAL_TABLET | Freq: Every day | ORAL | Status: DC

## 2011-12-12 NOTE — Assessment & Plan Note (Signed)
Placing pt on lovenox for anticoagulation while pregnant. Stressed importance of compliance with this given increased VTE risk with pregnancy.

## 2011-12-12 NOTE — Assessment & Plan Note (Signed)
Will treat with rocephin and azithro today. Doxy contraindicated given pregnancy. Overall case reviewed with Dr. Macon Large. No clinical indications for admission. Red flags for emergent evaluation were discussed including fever, intractable nausea, abd pain, and vaginal bleeding. Wet prep, GC/Chl today.

## 2011-12-12 NOTE — Patient Instructions (Addendum)
It was good to meet you today.  Be sure to set up an appointment with Dr. Denyse Amass next week If you develop any fever, abdominal  pain, vaginal bleeding, uncontrollable nausea or vomiting, please [facility], Call with any questions,  God Bless,  Paula Albee MD  Pelvic Inflammatory Disease Pelvic Inflammatory Disease (PID) is an infection in some or all of your female organs. This includes the womb (uterus), ovaries, fallopian tubes and tissues in the pelvis. PID is a common cause of sudden onset (acute) lower abdominal (pelvic) pain. PID can be treated, but it is a serious infection. It may take weeks before you are completely well. In some cases, hospitalization is needed for surgery or to administer medications to kill germs (antibiotics) through your veins (intravenously). CAUSES   It may be caused by germs that are spread during sexual contact.   PID can also occur following:   The birth of a baby.   A miscarriage.   An abortion.   Major surgery of the pelvis.   Use of an IUD.   Sexual assault.  SYMPTOMS   Abdominal or pelvic pain.   Fever.   Chills.   Abnormal vaginal discharge.  DIAGNOSIS  Your caregiver will choose some of these methods to make a diagnosis:  A physical exam and history.   Blood tests.   Cultures of the vagina and cervix.   X-rays or ultrasound.   A procedure to look inside the pelvis (laparoscopy).  TREATMENT   Use of antibiotics by mouth or intravenously.   Treatment of sexual partners when the infection is an sexually transmitted disease (STD).   Hospitalization and surgery may be needed.  RISKS AND COMPLICATIONS   PID can cause women to become unable to have children (sterile) if left untreated or if partially treated. That is why it is important to finish all medications given to you.   Sterility or future tubal (ectopic) pregnancies can occur in fully treated individuals. This is why it is so important to follow your  prescribed treatment.   It can cause longstanding (chronic) pelvic pain after frequent infections.   Painful intercourse.   Pelvic abscesses.   In rare cases, surgery or a hysterectomy may be needed.   If this is a sexually transmitted infection (STI), you are also at risk for any other STD including AIDSor human papillomavirus (HPV).  HOME CARE INSTRUCTIONS   Finish all medication as prescribed. Incomplete treatment will put you at risk for sterility and tubal pregnancy.   Only take over-the-counter or prescription medicines for pain, discomfort, or fever as directed by your caregiver.   Do not have sex until treatment is completed or as directed by your caregiver. If PID is confirmed, your recent sexual contacts will need treatment.   Keep your follow-up appointments.  SEEK MEDICAL CARE IF:   You have increased or abnormal vaginal discharge.   You need prescription medication for your pain.   Your partner has an STD.   You are vomiting.   You cannot take your medications.  SEEK IMMEDIATE MEDICAL CARE IF:   You have a fever.   You develop increased abdominal or pelvic pain.   You develop chills.   You have pain when you urinate.   You are not better after 72 hours following treatment.  Document Released: 11/21/2005 Document Revised: 08/03/2011 Document Reviewed: 08/04/2007 [facility] Patient Information 2012 [place], [place].

## 2011-12-12 NOTE — Assessment & Plan Note (Signed)
Acute flare of this today. 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 Macon Large). Will refill valtrex. Stressed compliance with this medication.

## 2011-12-12 NOTE — Progress Notes (Signed)
MD ordered INR as part of ov.  Pt off warfarin due to pregnancy.

## 2011-12-12 NOTE — Assessment & Plan Note (Addendum)
OB labs drawn today. Plan for high risk referral. Stressed importance of appropriate follow up. Case reviewed with Dr. Macon Large.

## 2011-12-12 NOTE — Progress Notes (Signed)
  Subjective:    Patient ID: Paula Benson, female   DOB: July 15, 1986, 26 y.o.   MRN: [id number]  HPI 74 YOF w/ PMHx/o recurrent PEs, medical non compliance, as well as poor medical follow up  who was found to be upreg positive 1 month ago here for a work in visit to be started on lovenox as well as multiple medical problems.   Pt was found to be upreg positive on 11/30 in the MAU. Pt presented at that time with vaginal bleeding.  Pt was also noted to have been treated for a UTI at this visit. An OB U/S was also done at the time showing a viable IUP. Pt's PCP was made aware of this MAU visit and pt was instructed via phone to be seen for prenatal labs, lovenox, and high risk OB set up ASAP. This was approx 1 month ago.  Pt has not been seen since this time.  Vaginal bleeding has since d/c'd.  Pt states that the last time that she took coumadin was approx 1 month ago. From a medical history standpoint, pt states that she has been noncompliant with anticoagulation in the past because she does not like as well as forgets to take pills. Pt denies any CP, SOB, or increased WOB. No sxs consistent with prior PEs.   Vaginal Discharge: Pt reports vaginal discharge since completion of abx course for UTI. Discharge has been intermittently foul smelling as well as copious in nature. Pt also has a history of genital HSV. Pt states that she has had a genital outbreak around this same time frame. No dysuria, nausea, vomiting, diarrhea. Pt is on chronic acyclovir. Pt states that she has not been on this medication for the last month. No recent sexual activity per pt. No hx/o STDs apart from HSV per pt.   Review of Systems See HPI , otherwise ROS negative.     Objective:   Physical Exam Gen: up in chair, NAD HEENT: NCAT, EOMI, TMs clear bilaterally CV: RRR, no murmurs auscultated PULM: CTAB, no wheezes, rales, rhoncii ABD: S/NT/+ bowel sounds  GU: active herpetic outbreak on labia majora, copious amounts of  vaginal discharge, + TTP on external genitalia, + CMT, no adnexal tenderness EXT: 2+ peripheral pulses   Assessment & Plan:

## 2011-12-13 LAB — GC/CHLAMYDIA PROBE AMP, GENITAL
Chlamydia, DNA Probe: NEGATIVE
GC Probe Amp, Genital: NEGATIVE

## 2011-12-13 LAB — OBSTETRIC PANEL
Basophils Absolute: 0 10*3/uL (ref 0.0–0.1)
Basophils Relative: 0 % (ref 0–1)
Hemoglobin: 9 g/dL — ABNORMAL LOW (ref 12.0–15.0)
Hepatitis B Surface Ag: NEGATIVE
Lymphocytes Relative: 15 % (ref 12–46)
MCHC: 28.8 g/dL — ABNORMAL LOW (ref 30.0–36.0)
Neutro Abs: 7.3 10*3/uL (ref 1.7–7.7)
Neutrophils Relative %: 79 % — ABNORMAL HIGH (ref 43–77)
RDW: 20.4 % — ABNORMAL HIGH (ref 11.5–15.5)
Rubella: 38.3 IU/mL — ABNORMAL HIGH
WBC: 9.2 10*3/uL (ref 4.0–10.5)

## 2011-12-13 LAB — SICKLE CELL SCREEN: Sickle Cell Screen: NEGATIVE

## 2011-12-15 LAB — CULTURE, OB URINE: Colony Count: >=100000

## 2011-12-19 ENCOUNTER — Telehealth: Payer: Self-pay | Admitting: Family Medicine

## 2011-12-19 NOTE — Telephone Encounter (Signed)
Pt will need an EDD to take with her.  And she also wants to know results of her labs from last week.

## 2011-12-19 NOTE — Telephone Encounter (Signed)
LMOM for pt to rt call. Pt being followed at [facility],needs to get info faxed from them, we do have EDD, etc.

## 2011-12-19 NOTE — Telephone Encounter (Signed)
Needs proof of pregnancy - is trying to get medicaid and needs this asap.

## 2011-12-19 NOTE — Telephone Encounter (Signed)
Will forward to Dr Alvester Morin for test results, pt to pick up preg test result tomr. Wants test results ASAP as she was tx'd for PID.

## 2012-01-12 ENCOUNTER — Ambulatory Visit (INDEPENDENT_AMBULATORY_CARE_PROVIDER_SITE_OTHER): Payer: Self-pay | Admitting: Family Medicine

## 2012-01-12 DIAGNOSIS — Z331 Pregnant state, incidental: Secondary | ICD-10-CM

## 2012-01-12 DIAGNOSIS — Z86711 Personal history of pulmonary embolism: Secondary | ICD-10-CM

## 2012-01-12 DIAGNOSIS — A6000 Herpesviral infection of urogenital system, unspecified: Secondary | ICD-10-CM

## 2012-01-12 DIAGNOSIS — O210 Mild hyperemesis gravidarum: Secondary | ICD-10-CM | POA: Insufficient documentation

## 2012-01-12 MED ORDER — ONDANSETRON HCL 4 MG PO TABS: 4.0000 mg | ORAL_TABLET | Freq: Three times a day (TID) | ORAL | Status: AC | PRN

## 2012-01-12 MED ORDER — ACYCLOVIR 400 MG PO TABS: ORAL_TABLET | ORAL | Status: DC

## 2012-01-12 NOTE — Patient Instructions (Signed)
Thank you for coming in today. Take the acyclovir three times a day for 5 days then 2x a day after that.  Use tylenol and zofran for migraines.  PLEASE get your medicaid.  We will work to get you the Lovenox ASAP.  Expect a call in a few days.  See me in 1-2 weeks.  Call [facility] and try to get an appointment for the high risk clinic. You already been referred.  Start taking prenatal vitamins.

## 2012-01-13 ENCOUNTER — Telehealth: Payer: Self-pay | Admitting: Clinical

## 2012-01-13 NOTE — Telephone Encounter (Signed)
Clinical Child psychotherapist (CSW) received referral to assist with Medicaid. CSW spoke with pt and informed her that she needs to go to DSS to apply for Medicaid. Pt stated she is aware of this and applied for Medicaid yesterday. CSW encouraged pt to follow up with DSS weekly to get a better idea as to how soon she will be able to receive benefits. CSW also informed pt to contact the clinic once she receives Medicaid. Pt appreciative of assistance.  Theresia Bough, MSW, Theresia Majors [phone]

## 2012-01-13 NOTE — Assessment & Plan Note (Signed)
F/u with [facility] high risk clinic ASAP. I asked her to contact the clinic for her appointment.

## 2012-01-13 NOTE — Assessment & Plan Note (Signed)
Will switch from valtrex to acyclovir bid dosing after 5 days of tid dosing.  Will follow up in 1-2 weeks.

## 2012-01-13 NOTE — Assessment & Plan Note (Signed)
It is absolutely imperative that Paula Benson start Lovenox ASAP.  I have encouraged her to get her medicaid ASAP and fill her lovenox. We will contact out social worker for further assistance.  Will f/u with me in 1-2 weeks. I warned that she is at risk for PE and Death. She expresses understanding.

## 2012-01-13 NOTE — Progress Notes (Signed)
Paula Benson is a 26 y.o. female who presents to [facility] today for follow up of pregnancy.  Was seen by Dr. Alvester Morin one month ago. At that time she was prescribed lovenox and scheduled with [facility] high risk clinic for her pregnancy. However in the interim she has not attempted to contact [facility] or fill her prescription for lovenox. Additionally she is not sure how to apply for pregnancy medicaid. She knows that she needs to take a pregnancy verification letter to Social services office.  She denies any leg swelling, chest pains, shortness of breath. She feels well. Additionally she denies any vaginal bleeding, contractions or pain. She is feeling the baby move.    PMH reviewed. Significant for multiple PEs and a history of non-adherence of anticoagulation.  ROS as above otherwise neg. Has had recurrent herpes outbreaks for several months.  Medications reviewed. Current Outpatient Prescriptions  Medication Sig Dispense Refill  . ibuprofen (ADVIL,MOTRIN) 200 MG tablet Take 400 mg by mouth every 6 (six) hours as needed. Pain        . ondansetron (ZOFRAN) 4 MG tablet Take 1 tablet (4 mg total) by mouth every 8 (eight) hours as needed for nausea.  20 tablet  0  . acyclovir (ZOVIRAX) 400 MG tablet 1 pill 3x a day for 5 days then 1 pill twice daily. Disp QS for 1 month. Then 60 pills daily with refills.  1 tablet  4  . albuterol (VENTOLIN HFA) 108 (90 BASE) MCG/ACT inhaler Inhale 2 puffs into the lungs every 4 (four) hours as needed. For dyspnea.      . enoxaparin (LOVENOX) 30 MG/0.3ML SOLN Inject 1 mL (100 mg total) into the skin daily.  50 mL  6  . Prenatal Vit-Fe Fumarate-FA (PRENATAL MULTIVITAMIN) 60-1 MG tablet Take 1 tablet by mouth daily.  30 tablet  4    Exam:  BP 134/80  Pulse 96  Ht 5\' 4"  (1.626 m)  Wt 156 lb (70.761 kg)  BMI 26.78 kg/m2  LMP 09/26/2011 Gen: Well NAD HEENT: EOMI,  MMM Lungs: CTABL Nl WOB Heart: RRR no MRG Abd: NABS, NT, ND Exts: Non edematous BL  LE, warm and well  perfused.

## 2012-01-20 ENCOUNTER — Ambulatory Visit (INDEPENDENT_AMBULATORY_CARE_PROVIDER_SITE_OTHER): Payer: Self-pay | Admitting: Family Medicine

## 2012-01-20 VITALS — BP 122/80 | HR 96 | Wt 154.0 lb

## 2012-01-20 DIAGNOSIS — N760 Acute vaginitis: Secondary | ICD-10-CM

## 2012-01-20 DIAGNOSIS — Z86711 Personal history of pulmonary embolism: Secondary | ICD-10-CM

## 2012-01-20 DIAGNOSIS — A499 Bacterial infection, unspecified: Secondary | ICD-10-CM

## 2012-01-20 DIAGNOSIS — Z331 Pregnant state, incidental: Secondary | ICD-10-CM

## 2012-01-20 MED ORDER — METRONIDAZOLE 500 MG PO TABS: 500.0000 mg | ORAL_TABLET | Freq: Two times a day (BID) | ORAL | Status: AC

## 2012-01-20 NOTE — Patient Instructions (Signed)
Thank you for coming in today. Take the Flagyl twice a day for 1 week.  Call [facility]. I have them call me for coordination of anticoagulation.  See me in 2 weeks for a prenatal visit or other.  I will try to contact the medicaid office for you to get it sooner.

## 2012-01-23 NOTE — Assessment & Plan Note (Signed)
Discharge very likely to be BP as this was diagnosed yet not fully treated.  Will try course of Flagyl and followup in one to 2 weeks

## 2012-01-23 NOTE — Assessment & Plan Note (Signed)
Am waiting for her to be able to afford Lovenox.  Will contact manufacturer to see if we can get a supply before she gets on Medicaid. (Patient will call manufacturer I provided her the information to do this) Followup in one to 2 weeks, followup center is becoming symptomatic with PE symptoms. Paula Benson is well versed in what those are

## 2012-01-23 NOTE — Progress Notes (Signed)
Paula Benson is a 26 y.o. female who presents to [facility] today for   1) history of PE: Has been to Medicaid to get Medicaid card but has not yet received it. No signs or symptoms of PE such as chest pain shortness of breath palpitations or leg swelling. Currently feels well but cannot afford Lovenox at this time.   2) pregnancy: At around 15-16 weeks. Has not yet been to [facility].  Is considering termination of pregnancy and wishes [facility] on [facility]. No bleeding, is feeling the baby move   3) vaginal discharge: Was seen by Dr. Alvester Morin in approximately one month ago for vaginal discharge and was empirically diagnosed with pelvic inflammatory disease and treated with doxycycline and ceftriaxone. Wet prep at that time additionally showed bacteria vaginosis. She has not yet had a course of Flagyl yet still has occasional discharge.    PMH reviewed. Significant for multiple pulmonary embolisms around pregnancy  ROS as above otherwise neg Medications reviewed. Current Outpatient Prescriptions  Medication Sig Dispense Refill  . acyclovir (ZOVIRAX) 400 MG tablet 1 pill 3x a day for 5 days then 1 pill twice daily. Disp QS for 1 month. Then 60 pills daily with refills.  1 tablet  4  . albuterol (VENTOLIN HFA) 108 (90 BASE) MCG/ACT inhaler Inhale 2 puffs into the lungs every 4 (four) hours as needed. For dyspnea.      Marland Kitchen ibuprofen (ADVIL,MOTRIN) 200 MG tablet Take 400 mg by mouth every 6 (six) hours as needed. Pain        . Prenatal Vit-Fe Fumarate-FA (PRENATAL MULTIVITAMIN) 60-1 MG tablet Take 1 tablet by mouth daily.  30 tablet  4  . enoxaparin (LOVENOX) 30 MG/0.3ML SOLN Inject 1 mL (100 mg total) into the skin daily.  50 mL  6  . metroNIDAZOLE (FLAGYL) 500 MG tablet Take 1 tablet (500 mg total) by mouth 2 (two) times daily.  14 tablet  0    Exam:  BP 122/80  Pulse 96  Wt 154 lb (69.854 kg)  LMP 09/26/2011 Gen: Well NAD HEENT: EOMI,  MMM Lungs: CTABL Nl WOB Heart: RRR  no MRG Abd: NABS, NT, ND Exts: Non edematous BL  LE, warm and well perfused.

## 2012-01-23 NOTE — Assessment & Plan Note (Signed)
Patient is not sure if she wishes to continue with this pregnancy. I provided her information on [facility] in this area and will be happy to coordinate care with OB/GYN there.  If she wishes to continue with this pregnancy will followup soon for initial dedicated prenatal visit

## 2012-01-24 ENCOUNTER — Telehealth: Payer: Self-pay | Admitting: Family Medicine

## 2012-01-24 NOTE — Telephone Encounter (Signed)
Patient is calling about a form for eligibility for Lovenox.  There are some parts that Dr. Denyse Amass needs to complete and she wants to talk to him about that.  They need the Dx and that the Lovenox is what is need to treat her Dx.  Please give her a call.

## 2012-01-24 NOTE — Telephone Encounter (Signed)
Patient returned call and is supposed to fax form for Dr Denyse Amass to fill out.Paula Benson, Rodena Medin

## 2012-01-24 NOTE — Telephone Encounter (Signed)
Left message for patient to return call. Where is the form she needs to be completed.Kamri Gotsch, Rodena Medin

## 2012-02-02 ENCOUNTER — Ambulatory Visit (INDEPENDENT_AMBULATORY_CARE_PROVIDER_SITE_OTHER): Payer: Self-pay | Admitting: Family Medicine

## 2012-02-02 ENCOUNTER — Encounter: Payer: Self-pay | Admitting: Family Medicine

## 2012-02-02 VITALS — BP 121/75 | HR 88 | Ht 64.0 in | Wt 159.0 lb

## 2012-02-02 DIAGNOSIS — R42 Dizziness and giddiness: Secondary | ICD-10-CM | POA: Insufficient documentation

## 2012-02-02 DIAGNOSIS — Z331 Pregnant state, incidental: Secondary | ICD-10-CM

## 2012-02-02 DIAGNOSIS — Z86711 Personal history of pulmonary embolism: Secondary | ICD-10-CM

## 2012-02-02 NOTE — Assessment & Plan Note (Signed)
Has not had full intake history for prenatal care as every visit has been an acute visit related to either her anticoagulation needs, herpes or now dizziness.  She has had intake labs, and had been referred high risk obstetrics which she did not go to. Additionally she was late in establishing pregnancy Medicaid which he still does not have. She is currently far enough along for ultrasound. Plan to refer for ultrasound and repeat refer to high risk obstetrics.  If she is unable to get in in a timely manner we'll see her back as soon as possible for dedicated intake history for prenatal care. However I do not want to delay her prenatal care and a longer.

## 2012-02-02 NOTE — Assessment & Plan Note (Signed)
History of 3 PE is usually revolving around pregnancy.  We have been trying to get her Lovenox which he has been unable to afford. She is nearing getting her OB Medicaid. However as this is taking too long we are contacting the manufacturer and have applied for free Lovenox.  She does not have any pulmonary embolism symptoms currently.  Will be diligently watchful

## 2012-02-02 NOTE — Assessment & Plan Note (Signed)
Unsure of the etiology. Exam is essentially normal today. We'll continue to follow a self-limiting process no workup needed however if worsening or continuing will proceed with further evaluation.  Discuss this with the patient who expresses understand.

## 2012-02-02 NOTE — Patient Instructions (Addendum)
Thank you for coming in today. Please get lovenox.  We are trying to get it from the manufacturer.  Call as soon as you get medicaid.  We will schedule the ultrasound and [facility].  If you feel bad come back.  See me ASAP for an initial dedicated OB visit. You can also look up PE info on [email or web address]   Pulmonary Embolus A pulmonary (lung) embolus (PE) is a blood clot that has traveled from another place in the body to the lung. Most clots come from deep veins in the legs or pelvis. PE is a dangerous and potentially life-threatening condition that can be treated if identified. CAUSES Blood clots form in a vein for different reasons. Usually several things cause blood clots. They include:  The flow of blood slows down.     The inside of the vein is damaged in some way.     The person has a condition that makes the blood clot more easily. These conditions may include:     Older age (especially over 35 years old).     Having a history of blood clots.     Having major or lengthy surgery. Hip surgery is particularly high-risk.     Breaking a hip or leg.     Sitting or lying still for a long time.     Cancer or cancer treatment.     Having a long, thin tube (catheter) placed inside a vein during a medical procedure.     Being overweight (obese).     Pregnancy and childbirth.     Medicines with estrogen.     Smoking.    Other circulation or heart problems.  SYMPTOMS   The symptoms of a PE usually start suddenly and include:  Shortness of breath.     Coughing.    Coughing up blood or blood-tinged mucus (phlegm).     Chest pain. Pain is often worse with deep breaths.     Rapid heartbeat.  DIAGNOSIS   If a PE is suspected, your caregiver will take a medical history and carry out a physical exam. Your caregiver will check for the risk factors listed above. Tests that also may be required include:  Blood tests, including  studies of the clotting properties of your blood.     Imaging tests. Ultrasound, CT, MRI, and other tests can all be used to see if you have clots in your legs or lungs. If you have a clot in your legs and have breathing or chest problems, your caregiver may conclude that you have a clot in your lungs. Further lung tests may not be needed.     An EKG can look for heart strain from blood clots in the lungs.  PREVENTION    Exercise the legs regularly. Take a brisk 30 minute walk every day.     Maintain a weight that is appropriate for your height.     Avoid sitting or lying in bed for long periods of time without moving your legs.     Women, particularly those over the age of 79, should consider the risks and benefits of taking estrogen medicines, including birth control pills.     Do not smoke, especially if you take estrogen medicines.     Long-distance travel can increase your risk. You should exercise your legs by walking or pumping the muscles every hour.     In hospital prevention:  Your caregiver will assess your need for preventive PE care (prophylaxis) when you are admitted to the hospital. If you are having surgery, your surgeon will assess you the day of or day after surgery.     Prevention may include medical and nonmedical measures.  TREATMENT    The most common treatment for a PE is blood thinning (anticoagulant) medicine, which reduces the blood's tendency to clot. Anticoagulants can stop new blood clots from forming and old ones from growing. They cannot dissolve existing clots. Your body does this by itself over time. Anticoagulants can be given by mouth, by intravenous (IV) access, or by injection. Your caregiver will determine the best program for you.     Less commonly, clot-dissolving drugs (thrombolytics) are used to dissolve a PE. They carry a high risk of bleeding, so they are used mainly in severe cases.     Very rarely, a blood clot in the leg needs to be  removed surgically.     If you are unable to take anticoagulants, your caregiver may arrange for you to have a filter placed in a main vein in your belly (abdomen). This filter prevents clots from traveling to your lungs.  HOME CARE INSTRUCTIONS    Take all medicines prescribed by your caregiver. Follow the directions carefully.     You will most likely continue taking anticoagulants after you leave the hospital. Your caregiver will advise you on the length of treatment (usually 3 to 6 months, sometimes for life).     Taking too much or too little of an anticoagulant is dangerous. While taking this type of medicine, you will need to have regular blood tests to be sure the dose is correct. The dose can change for many reasons. It is critically important that you take this medicine exactly as prescribed and that you have blood tests exactly as directed.     Many foods can interfere with anticoagulants. These include foods high in vitamin K, such as spinach, kale, broccoli, cabbage, collard and turnip greens, Brussels sprouts, peas, cauliflower, seaweed, parsley, beef and pork liver, green tea, and soybean oil. Your caregiver should discuss limits on these foods with you or you should arrange a visit with a dietician to answer your questions.     Many medicines can interfere with anticoagulants. You must tell your caregiver about any and all medicines you take. This includesall vitamins and supplements. Be especially cautious with aspirin and anti-inflammatory medicines. Ask your caregiver before taking these.     Anticoagulants can have side effects, mostly excessive bruising or bleeding. You will need to hold pressure over cuts for longer than usual. Avoid alcoholic drinks or consume only very small amounts while taking this medicine.     If you are taking an anticoagulant:     Wear a medical alert bracelet.     Notify your dentist or other caregivers before procedures.     Avoid contact  sports.     Ask your caregiver how soon you can go back to normal activities. Not being active can lead to new clots. Ask for a list of what you should and should not do.     Exercise your lower leg muscles. This is important while traveling.     You may need to wear compression stockings. These are tight elastic stockings that apply pressure to the lower legs. This can help keep the blood in the legs from clotting.     If you are a smoker, you should  quit.     Learn as much as you can about pulmonary embolisms.  SEEK MEDICAL CARE IF:    You notice a rapid heartbeat.     You feel weaker or more tired than usual.     You feel faint.     You notice increased bruising.     Your symptoms are not getting better in the time expected.     You are having side effects of medicine.     You have an oral temperature above 102 F (38.9 C).     You discover other family members with blood clots. This may require further testing for inherited diseases or conditions.  SEEK IMMEDIATE MEDICAL CARE IF:    You have chest pain.     You have trouble breathing.     You have new or increased swelling or pain in one leg.     You cough up blood.     You notice blood in vomit, in a bowel movement, or in urine.     You have an oral temperature above 102 F (38.9 C), not controlled by medicine.  You may have another PE. A blood clot in the lungs is a medical emergency. Call your local emergency services (911 in [place] to get to the nearest hospital or clinic. Do not drive yourself. MAKE SURE YOU:    Understand these instructions.     Will watch your condition.     Will get help right away if you are not doing well or get worse.  Document Released: 11/18/2000 Document Revised: 08/03/2011 Document Reviewed: 05/25/2009 [facility] Patient Information 2012 [place], [place].

## 2012-02-02 NOTE — Progress Notes (Signed)
Patient ID: Paula Benson, female   DOB: [birth year], 26 y.o.   MRN: [id number] Paula Benson is a 26 y.o. female who presents to [facility] today for   Dizziness and prenatal care.  Dizziness for the last 4 days. [name] describes it as lightheadedness that is brief and intermittent. She denies vertigo palpitations chest pain trouble breathing or syncope. This is not consistent with prior pulmonary embolisms. Additionally she notes she's had some mild diarrhea since yesterday. She feels well otherwise.  From a OB standpoint: She was unable to find a abortion practitioner who would do a abortion at 16 weeks in the area, and is going to keep this child. She would like to initiate prenatal care.    She feels baby movement but denies any bleeding contractions or discharge.   PMH reviewed. Significant for pulmonary embolisms and preeclampsia with eclampsia ROS as above otherwise neg Medications reviewed. Current Outpatient Prescriptions  Medication Sig Dispense Refill  . acyclovir (ZOVIRAX) 400 MG tablet 1 pill 3x a day for 5 days then 1 pill twice daily. Disp QS for 1 month. Then 60 pills daily with refills.  1 tablet  4  . albuterol (VENTOLIN HFA) 108 (90 BASE) MCG/ACT inhaler Inhale 2 puffs into the lungs every 4 (four) hours as needed. For dyspnea.      . Prenatal Vit-Fe Fumarate-FA (PRENATAL MULTIVITAMIN) 60-1 MG tablet Take 1 tablet by mouth daily.  30 tablet  4  . enoxaparin (LOVENOX) 30 MG/0.3ML SOLN Inject 1 mL (100 mg total) into the skin daily.  50 mL  6  . ibuprofen (ADVIL,MOTRIN) 200 MG tablet Take 400 mg by mouth every 6 (six) hours as needed. Pain          Exam:  BP 121/75  Pulse 88  Ht 5\' 4"  (1.626 m)  Wt 159 lb (72.122 kg)  BMI 27.29 kg/m2  LMP 09/26/2011 Gen: Well NAD HEENT: EOMI,  MMM Lungs: CTABL Nl WOB Heart: RRR no MRG Abd: NABS, NT, ND gravid at 22 cm.  Fetal heart rate 140 Exts: Non edematous BL  LE, warm and well perfused.  Neuro: Alert and oriented  coordination balance and gait are normal.

## 2012-02-03 ENCOUNTER — Ambulatory Visit (HOSPITAL_COMMUNITY)
Admission: RE | Admit: 2012-02-03 | Discharge: 2012-02-03 | Disposition: A | Discharge status: Home / Self Care | Payer: Self-pay | Source: Ambulatory Visit | Attending: Family Medicine | Admitting: Family Medicine

## 2012-02-03 DIAGNOSIS — Z9289 Personal history of other medical treatment: Secondary | ICD-10-CM

## 2012-02-03 DIAGNOSIS — Z1389 Encounter for screening for other disorder: Secondary | ICD-10-CM | POA: Insufficient documentation

## 2012-02-03 DIAGNOSIS — O0930 Supervision of pregnancy with insufficient antenatal care, unspecified trimester: Secondary | ICD-10-CM | POA: Insufficient documentation

## 2012-02-03 DIAGNOSIS — O09299 Supervision of pregnancy with other poor reproductive or obstetric history, unspecified trimester: Secondary | ICD-10-CM | POA: Insufficient documentation

## 2012-02-03 DIAGNOSIS — Z363 Encounter for antenatal screening for malformations: Secondary | ICD-10-CM | POA: Insufficient documentation

## 2012-02-03 DIAGNOSIS — O358XX0 Maternal care for other (suspected) fetal abnormality and damage, not applicable or unspecified: Secondary | ICD-10-CM | POA: Insufficient documentation

## 2012-02-03 DIAGNOSIS — Z331 Pregnant state, incidental: Secondary | ICD-10-CM

## 2012-02-03 DIAGNOSIS — O34219 Maternal care for unspecified type scar from previous cesarean delivery: Secondary | ICD-10-CM | POA: Insufficient documentation

## 2012-02-03 HISTORY — DX: Personal history of other medical treatment: Z92.89

## 2012-02-09 ENCOUNTER — Encounter: Payer: Self-pay | Admitting: Obstetrics & Gynecology

## 2012-02-15 ENCOUNTER — Telehealth: Payer: Self-pay | Admitting: Family Medicine

## 2012-02-15 NOTE — Telephone Encounter (Signed)
Patient was prescribed Flagyl that has caused a yeast infection and doesn't know if she should try something over the counter or if something can be called in for her.

## 2012-02-15 NOTE — Telephone Encounter (Signed)
Patient was prescibed Flaygl on 02/15 but she has not had the money to pay for it until just recently . Now feels she has a yeast infection.   Has vaginal itching and white discharge. States she has had Flagyl many times before and always gets yeast. Advised she can try OTC or I can send message to MD .  States she may go ahead and try Monistat , but would like for message to be sent to MD for RX .

## 2012-02-16 ENCOUNTER — Inpatient Hospital Stay (HOSPITAL_COMMUNITY)
Admission: AD | Admit: 2012-02-16 | Discharge: 2012-02-18 | DRG: 781 | Disposition: A | Discharge status: Home / Self Care | Payer: Medicaid Other | Source: Ambulatory Visit | Attending: Obstetrics & Gynecology | Admitting: Obstetrics & Gynecology

## 2012-02-16 ENCOUNTER — Encounter (HOSPITAL_COMMUNITY): Payer: Self-pay | Admitting: *Deleted

## 2012-02-16 ENCOUNTER — Inpatient Hospital Stay (HOSPITAL_COMMUNITY): Payer: Medicaid Other

## 2012-02-16 DIAGNOSIS — O99019 Anemia complicating pregnancy, unspecified trimester: Principal | ICD-10-CM | POA: Diagnosis present

## 2012-02-16 DIAGNOSIS — D649 Anemia, unspecified: Secondary | ICD-10-CM | POA: Diagnosis present

## 2012-02-16 DIAGNOSIS — Z86711 Personal history of pulmonary embolism: Secondary | ICD-10-CM

## 2012-02-16 DIAGNOSIS — O0990 Supervision of high risk pregnancy, unspecified, unspecified trimester: Secondary | ICD-10-CM

## 2012-02-16 DIAGNOSIS — D509 Iron deficiency anemia, unspecified: Secondary | ICD-10-CM

## 2012-02-16 DIAGNOSIS — I2699 Other pulmonary embolism without acute cor pulmonale: Secondary | ICD-10-CM

## 2012-02-16 HISTORY — DX: Migraine, unspecified, not intractable, without status migrainosus: G43.909

## 2012-02-16 HISTORY — DX: Eclampsia, unspecified as to time period: O15.9

## 2012-02-16 HISTORY — DX: Unspecified convulsions: R56.9

## 2012-02-16 LAB — CBC
HCT: 26.5 % — ABNORMAL LOW (ref 36.0–46.0)
Hemoglobin: 8.1 g/dL — ABNORMAL LOW (ref 12.0–15.0)
RBC: 3.78 MIL/uL — ABNORMAL LOW (ref 3.87–5.11)
RDW: 19 % — ABNORMAL HIGH (ref 11.5–15.5)
WBC: 8.4 10*3/uL (ref 4.0–10.5)

## 2012-02-16 LAB — ANTITHROMBIN III: AntiThromb III Func: 96 % (ref 75–120)

## 2012-02-16 LAB — PREPARE RBC (CROSSMATCH)

## 2012-02-16 MED ORDER — SODIUM CHLORIDE 0.9 % IJ SOLN
INTRAMUSCULAR | Status: AC
  Filled 2012-02-16: qty 6

## 2012-02-16 MED ORDER — ENOXAPARIN SODIUM 80 MG/0.8ML ~~LOC~~ SOLN
1.0000 mg/kg | Freq: Two times a day (BID) | SUBCUTANEOUS | Status: DC
  Administered 2012-02-16 – 2012-02-18 (×4): 70 mg via SUBCUTANEOUS
  Filled 2012-02-16 (×6): qty 0.8

## 2012-02-16 MED ORDER — ZOLPIDEM TARTRATE 10 MG PO TABS
10.0000 mg | ORAL_TABLET | Freq: Every evening | ORAL | Status: DC | PRN
  Administered 2012-02-16 – 2012-02-17 (×2): 10 mg via ORAL
  Filled 2012-02-16 (×2): qty 1

## 2012-02-16 MED ORDER — ENOXAPARIN SODIUM 80 MG/0.8ML ~~LOC~~ SOLN: 1.5000 mg/kg | SUBCUTANEOUS | Status: DC

## 2012-02-16 MED ORDER — DIPHENHYDRAMINE HCL 50 MG/ML IJ SOLN
25.0000 mg | Freq: Once | INTRAMUSCULAR | Status: AC
  Administered 2012-02-16: 25 mg via INTRAVENOUS
  Filled 2012-02-16: qty 1

## 2012-02-16 MED ORDER — MICONAZOLE NITRATE 2 % VA CREA: 1.0000 | TOPICAL_CREAM | Freq: Every day | VAGINAL | Status: AC

## 2012-02-16 MED ORDER — ENOXAPARIN SODIUM 80 MG/0.8ML ~~LOC~~ SOLN: 1.0000 mg/kg | Freq: Two times a day (BID) | SUBCUTANEOUS | Status: DC

## 2012-02-16 MED ORDER — ALBUTEROL SULFATE HFA 108 (90 BASE) MCG/ACT IN AERS
2.0000 | INHALATION_SPRAY | RESPIRATORY_TRACT | Status: DC | PRN
  Administered 2012-02-18: 2 via RESPIRATORY_TRACT
  Filled 2012-02-16: qty 6.7

## 2012-02-16 MED ORDER — ACETAMINOPHEN 325 MG PO TABS
650.0000 mg | ORAL_TABLET | ORAL | Status: DC | PRN
  Filled 2012-02-16: qty 2

## 2012-02-16 MED ORDER — CLOTRIMAZOLE 1 % VA CREA
1.0000 | TOPICAL_CREAM | Freq: Every day | VAGINAL | Status: DC
  Administered 2012-02-16 – 2012-02-17 (×2): 1 via VAGINAL
  Filled 2012-02-16: qty 45

## 2012-02-16 MED ORDER — CALCIUM CARBONATE ANTACID 500 MG PO CHEW
2.0000 | CHEWABLE_TABLET | ORAL | Status: DC | PRN
  Filled 2012-02-16: qty 2

## 2012-02-16 MED ORDER — PRENATAL MULTIVITAMIN CH
1.0000 | ORAL_TABLET | Freq: Every day | ORAL | Status: DC
  Administered 2012-02-16 – 2012-02-18 (×3): 1 via ORAL
  Filled 2012-02-16 (×5): qty 1

## 2012-02-16 MED ORDER — IOHEXOL 300 MG/ML  SOLN
100.0000 mL | Freq: Once | INTRAMUSCULAR | Status: AC | PRN
  Administered 2012-02-16: 100 mL via INTRAVENOUS

## 2012-02-16 MED ORDER — ACETAMINOPHEN 325 MG PO TABS
650.0000 mg | ORAL_TABLET | Freq: Once | ORAL | Status: AC
  Administered 2012-02-16: 650 mg via ORAL

## 2012-02-16 MED ORDER — LACTATED RINGERS IV SOLN
INTRAVENOUS | Status: DC
  Administered 2012-02-16 – 2012-02-17 (×3): via INTRAVENOUS

## 2012-02-16 MED ORDER — ACYCLOVIR 400 MG PO TABS
400.0000 mg | ORAL_TABLET | Freq: Two times a day (BID) | ORAL | Status: DC
  Filled 2012-02-16 (×3): qty 1

## 2012-02-16 MED ORDER — ACYCLOVIR 400 MG PO TABS
400.0000 mg | ORAL_TABLET | Freq: Three times a day (TID) | ORAL | Status: DC
  Administered 2012-02-16 – 2012-02-18 (×4): 400 mg via ORAL
  Filled 2012-02-16 (×9): qty 1

## 2012-02-16 MED ORDER — DOCUSATE SODIUM 100 MG PO CAPS
100.0000 mg | ORAL_CAPSULE | Freq: Every day | ORAL | Status: DC
  Administered 2012-02-16 – 2012-02-18 (×3): 100 mg via ORAL
  Filled 2012-02-16 (×5): qty 1

## 2012-02-16 NOTE — MAU Provider Note (Signed)
History     CSN: [id number]  Arrival date and time: 02/16/12 1100   None     Chief Complaint  Patient presents with  . Dizziness  . Headache   HPI This is a 26 year old G4 P2 012 at 20 weeks and 3 days who is seen in the MAU for complaints of intermittent chest discomfort, shortness of breath, palpitations that started 2-3 weeks ago it has worsened over the past several days. She does have a history of pulmonary embolism, which has had 3 times. She was on chronic Coumadin therapy for this until she became pregnant. Due to lack of insurance she has not been able to afford the Lovenox was prescribed to her. She's been without treatment for the past 4-5 months. She denies dizziness, lightheadedness, lower extremity edema and erythema, but does admit to having a presyncopal feelings occasionally. She was also recently seen at the [facility] office and had her hemoglobin tested, which showed her hemoglobin as 7.8.  OB History    Grav Para Term Preterm Abortions TAB SAB Ect Mult Living   4 2 2  0 1 1 0 0 0 2      Past Medical History  Diagnosis Date  . Asthma   . 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 Preeclampsia     with first pregnancy  . Bipolar II disorder   . Suicidal ideations   . Headache   . Pregnancy induced hypertension     seized 2wks after  . History of blood clots     x3  . Anemia   . Urinary tract infection   . Pyelonephritis   . Herpes   . Anxiety   . Seizures   . Eclamptic seizure 2003    Past Surgical History  Procedure Date  . Cesarean section 12/2001 and 01/2008  . Induced abortion 01/2010    Family History  Problem Relation Age of Onset  . Diabetes Mother     Type 2  . Anemia Sister   . Anesthesia problems Neg Hx     History  Substance Use Topics  . Smoking status: Never Smoker   . Smokeless tobacco: Never Used  . Alcohol Use: No    Allergies: No Known  Allergies  Prescriptions prior to admission  Medication Sig Dispense Refill  . acyclovir (ZOVIRAX) 400 MG tablet 1 pill 3x a day for 5 days then 1 pill twice daily. Disp QS for 1 month. Then 60 pills daily with refills.  1 tablet  4  . metroNIDAZOLE (FLAGYL) 500 MG tablet Take 500 mg by mouth 2 (two) times daily.      . Prenatal Vit-Fe Fumarate-FA (PRENATAL MULTIVITAMIN) 60-1 MG tablet Take 1 tablet by mouth daily.  30 tablet  4  . albuterol (VENTOLIN HFA) 108 (90 BASE) MCG/ACT inhaler Inhale 2 puffs into the lungs every 4 (four) hours as needed. For dyspnea.      . enoxaparin (LOVENOX) 30 MG/0.3ML SOLN Inject 1 mL (100 mg total) into the skin daily.  50 mL  6  . miconazole ([employer] MICONAZOLE 7) 2 % vaginal cream Place 7 Applicatorfuls vaginally at bedtime.  45 g  0    Review of Systems  Constitutional: Positive for malaise/fatigue. Negative for fever and chills.  Eyes: Negative for blurred vision, double vision and photophobia.  Cardiovascular: Positive for chest pain and palpitations. Negative for leg swelling.  Gastrointestinal: Negative for  nausea and vomiting.  Neurological: Negative for weakness and headaches.  Endo/Heme/Allergies: Does not bruise/bleed easily.   Physical Exam   Blood pressure 122/80, pulse 99, temperature 99.2 F (37.3 C), temperature source Oral, resp. rate 18, height 5\' 3"  (1.6 m), weight 71.668 kg (158 lb), last menstrual period 09/26/2011, SpO2 100.00%.  Physical Exam  Constitutional: She is oriented to person, place, and time. She appears well-developed and well-nourished.  HENT:  Head: Normocephalic and atraumatic.  Eyes: Conjunctivae are normal. Pupils are equal, round, and reactive to light.  Neck: Normal range of motion.  Cardiovascular: Normal rate, regular rhythm and normal heart sounds.   Respiratory: Effort normal and breath sounds normal. No respiratory distress. She has no wheezes. She has no rales. She exhibits no tenderness.  GI: Soft. Bowel  sounds are normal. She exhibits no distension and no mass. There is no tenderness. There is no rebound and no guarding.  Musculoskeletal: Normal range of motion. She exhibits no edema and no tenderness.  Neurological: She is alert and oriented to person, place, and time.  Skin: Skin is warm and dry. No rash noted. No erythema. No pallor.  Psychiatric: She has a normal mood and affect. Her behavior is normal. Judgment and thought content normal.   Lab Results  Component Value Date   WBC 8.4 02/16/2012   HGB 8.1* 02/16/2012   HCT 26.5* 02/16/2012   MCV 70.1* 02/16/2012   PLT 383 02/16/2012   Findings: No filling defects in the pulmonary arteries to suggest  pulmonary emboli. Heart is normal size. Aorta is normal caliber.  Small pericardial effusion present. No pleural effusions. No  mediastinal, hilar, or axillary adenopathy. Small bilateral  axillary lymph nodes, none pathologically enlarged. Visualized  thyroid and chest wall soft tissues unremarkable.  Lungs are clear. No focal airspace opacities or suspicious  nodules. No effusions. Imaging into the upper abdomen shows no  acute findings. No acute bony abnormality.  IMPRESSION:  No evidence of pulmonary embolus.  Small pericardial effusion.   MAU Course  Procedures  MDM No evidence of PE.  Symptomatic anemia.  Assessment and Plan  1.  IUP at 20 weeks 3 days 2.  History of PE 3.  Symptomatic anemia  Will admit to third floor.  Discussed risks vs benefits of blood transfusion vs IV and oral iron.  Will give blood transfusion as patient is symptomatic.  Will type and crossmatch and transfuse 2 units - consent signed.  Will also give lovenox and consult pharmacy and Social Work as patient unable to afford treatment.    Kishawn Pickar JEHIEL 02/16/2012, 12:40 PM

## 2012-02-16 NOTE — Telephone Encounter (Signed)
Prescribed miaconazole cream (7 day type). Insurance may not pay for it. If not getting better please return to our clinic or [facility] OB clinic.  Route note to [employer]

## 2012-02-16 NOTE — H&P (Signed)
  See MAU provider note 

## 2012-02-16 NOTE — Telephone Encounter (Signed)
Patient called back and gave her message , she never picked up monistat OTC.  She will get this RX that was sent in electronically. .  Also patient states she did not go to appointment that was scheduled at [facility] clinic last week .  She has not called to reschedule. States last Thursday , a week ago, she went to sign up for [facility] and her hemoglobin  was checked with a reading of 7.5. She was not given any instruction at that time regarding follow up. States she feels like she may pass out, continues to have  headaches and now having some problem with breathing. Consulted with Dr. Denyse Amass and advised patient to go to [facility] MAU now immediately.  I ask if she has someone to take her and she states" yes". I called [facility] and rescheduled her High Risk appointment for 03/18 at 8:00 AM. Patient notified but again stressed importance of going to ED now. She voices understanding.

## 2012-02-16 NOTE — Plan of Care (Signed)
Problem: Consults Goal: Birthing Suites Patient Information Press F2 to bring up selections list  Pt < 37 weeks EGA7/29/2013

## 2012-02-16 NOTE — MAU Note (Signed)
Daily headache for past month and a half, meds not working.  2 wks ago, heart has been racing, feels at times like going to pass out. Applied for [facility] last wk, HBG was 7.5 on fingerstick.

## 2012-02-16 NOTE — Telephone Encounter (Signed)
Called again and left message on voicemail that an RX has been sent to pharmacy.  Call back if any questions.

## 2012-02-16 NOTE — Telephone Encounter (Signed)
Message left to return call.

## 2012-02-16 NOTE — H&P (Signed)
Admission History and Physical    HPI This is a 26 year old G4 P2 012 at 20 weeks and 3 days who is seen in the MAU for complaints of intermittent chest discomfort, shortness of breath, palpitations that started 2-3 weeks ago it has worsened over the past several days. She does have a history of pulmonary embolism, which has had 3 times. She was on chronic Coumadin therapy for this until she became pregnant. Due to lack of insurance she has not been able to afford the Lovenox was prescribed to her. She's been without treatment for the past 4-5 months. She denies dizziness, lightheadedness, lower extremity edema and erythema, but does admit to having a presyncopal feelings occasionally. She was also recently seen at the [facility] office and had her hemoglobin tested, which showed her hemoglobin as 7.8.    OB History    Grav  Para  Term  Preterm  Abortions  TAB  SAB  Ect  Mult  Living     4   2   2    0   1         1      0        0      0       2   Cesarean section x 2, TAB (D&E) x 1    Past Medical History   .  Asthma     .  Personal history of PE (pulmonary embolism)  x 3, latest 02/2010       noncompliant with INR checks, must limit refills   .  Preeclampsia         with first pregnancy   .  Bipolar II disorder     .  Suicidal ideations     .  Headache     .  Pregnancy induced hypertension         seized 2wks after   .  History of blood clots         x3   .  Anemia     .  Urinary tract infection     .  Pyelonephritis     .  Herpes     .  Anxiety     .  Seizures     .  Eclamptic seizure  2003      Past Surgical History   .  Cesarean section  12/2001 and 01/2008   .  Induced abortion  01/2010    Family History   .  Diabetes  Mother         Type 2   .  Anemia  Sister     .  Anesthesia problems  Neg Hx        Social History   Substance Use Topics   .  Smoking status:  Never Smoker    .  Smokeless tobacco:  Never Used   .  Alcohol Use:  No    Allergies: No Known Allergies      Prescriptions prior to admission   .  acyclovir (ZOVIRAX) 400 MG tablet  1 pill 3x a day for 5 days then 1 pill twice daily. Disp QS for 1 month. Then 60 pills daily with refills.   1 tablet   4   .  metroNIDAZOLE (FLAGYL) 500 MG tablet  Take 500 mg by mouth 2 (two) times daily.         .  Prenatal Vit-Fe Fumarate-FA (PRENATAL MULTIVITAMIN) 60-1 MG tablet  Take 1 tablet by mouth daily.   30 tablet   4   .  albuterol (VENTOLIN HFA) 108 (90 BASE) MCG/ACT inhaler  Inhale 2 puffs into the lungs every 4 (four) hours as needed. For dyspnea.         .  enoxaparin (LOVENOX) 30 MG/0.3ML SOLN  Inject 1 mL (100 mg total) into the skin daily.   50 mL   6   .  miconazole ([employer] MICONAZOLE 7) 2 % vaginal cream  Place 7 Applicatorfuls vaginally at bedtime.   45 g   0    Review of Systems  Constitutional: Positive for malaise/fatigue. Negative for fever and chills.  Eyes: Negative for blurred vision, double vision and photophobia.  Cardiovascular: Positive for chest pain and palpitations. Negative for leg swelling.  Gastrointestinal: Negative for nausea and vomiting.  Neurological: Negative for weakness and headaches.  Endo/Heme/Allergies: Does not bruise/bleed easily.     Physical Exam   Blood pressure 122/80, pulse 99, temperature 99.2 F (37.3 C), temperature source Oral, resp. rate 18, height 5\' 3"  (1.6 m), weight 71.668 kg (158 lb), last menstrual period 09/26/2011, SpO2 100.00%. Constitutional: She is oriented to person, place, and time. She appears well-developed and well-nourished.  Head: Normocephalic and atraumatic.  Eyes: Conjunctivae are normal. Pupils are equal, round, and reactive to light.  Neck: Normal range of motion.  Cardiovascular: Normal rate, regular rhythm and normal heart sounds.   Respiratory: Effort normal and breath sounds normal. No respiratory distress. She has no wheezes. She has no rales. She exhibits no tenderness.  GI: Soft. Bowel sounds are normal. She exhibits no  distension and no mass. There is no tenderness. There is no rebound and no guarding.  Musculoskeletal: Normal range of motion. She exhibits no edema and no tenderness.  Neurological: She is alert and oriented to person, place, and time.  Skin: Skin is warm and dry. No rash noted. No erythema. No pallor.  Psychiatric: She has a normal mood and affect. Her behavior is normal. Judgment and thought content normal.     Lab Results     WBC  8.4  02/16/2012     HGB  8.1*  02/16/2012     HCT  26.5*  02/16/2012     MCV  70.1*  02/16/2012     PLT  383  02/16/2012    CT CHEST 02/16/2012 Findings: No filling defects in the pulmonary arteries to suggest pulmonary emboli. Heart is normal size. Aorta is normal caliber.  Small pericardial effusion present. No pleural effusions. No mediastinal, hilar, or axillary adenopathy. Small bilateral axillary lymph nodes, none pathologically enlarged. Visualized   thyroid and chest wall soft tissues unremarkable.  Lungs are clear. No focal airspace opacities or suspicious nodules. No effusions. Imaging into the upper abdomen shows no acute findings. No acute bony abnormality.  IMPRESSION:  No evidence of pulmonary embolus.  Small pericardial effusion.  Assessment and Plan   1.  IUP at 20 weeks 3 days 2.  History of PE 3.  Symptomatic anemia 4.  No evidence of PE  Will admit to third floor.  Discussed risks vs benefits of blood transfusion vs IV and oral iron.  Will give blood transfusion as patient is symptomatic.  Will type and crossmatch and transfuse 2 units - consent signed.  Will also give lovenox and consult pharmacy and Social Work as patient unable to afford treatment.    STINSON, JACOB JEHIEL D.O 02/16/2012, 12:40 PM     Attestation  of Attending Supervision of Advanced Practitioner: Evaluation and management procedures were performed by the OB Fellow under my supervision/collaboration. Chart reviewed, and agree with management and plan.  Jaynie Collins,  M.D. 02/16/2012 3:10 PM

## 2012-02-17 ENCOUNTER — Other Ambulatory Visit: Payer: Self-pay

## 2012-02-17 LAB — LUPUS ANTICOAGULANT PANEL: PTT Lupus Anticoagulant: 35.5 secs (ref 28.0–43.0)

## 2012-02-17 LAB — HEMOGLOBIN AND HEMATOCRIT, BLOOD
HCT: 32.1 % — ABNORMAL LOW (ref 36.0–46.0)
Hemoglobin: 10.4 g/dL — ABNORMAL LOW (ref 12.0–15.0)

## 2012-02-17 LAB — PROTHROMBIN GENE MUTATION

## 2012-02-17 MED ORDER — DIPHENHYDRAMINE HCL 50 MG/ML IJ SOLN
25.0000 mg | Freq: Once | INTRAMUSCULAR | Status: AC
  Administered 2012-02-17: 25 mg via INTRAVENOUS
  Filled 2012-02-17: qty 1

## 2012-02-17 MED ORDER — ENOXAPARIN SODIUM 80 MG/0.8ML ~~LOC~~ SOLN: 1.0000 mg/kg | Freq: Two times a day (BID) | SUBCUTANEOUS | Status: DC

## 2012-02-17 MED ORDER — ACETAMINOPHEN 325 MG PO TABS
650.0000 mg | ORAL_TABLET | Freq: Once | ORAL | Status: AC
  Administered 2012-02-17: 650 mg via ORAL
  Filled 2012-02-17: qty 2

## 2012-02-17 MED ORDER — ENOXAPARIN SODIUM 150 MG/ML ~~LOC~~ SOLN: 1.0000 mg/kg | Freq: Two times a day (BID) | SUBCUTANEOUS | Status: DC

## 2012-02-17 MED ORDER — BUTALBITAL-APAP-CAFFEINE 50-325-40 MG PO TABS
2.0000 | ORAL_TABLET | ORAL | Status: DC | PRN
  Administered 2012-02-17: 2 via ORAL
  Filled 2012-02-17 (×2): qty 1

## 2012-02-17 NOTE — Progress Notes (Signed)
   CARE MANAGEMENT NOTE 02/17/2012  Patient:  Paula Benson, Paula Benson   Account Number:  [id number]  Date Initiated:  02/17/2012  Documentation initiated by:  Paula Benson  Subjective/Objective Assessment:   [redacted]wks pregnant  hx of multiple pulmonary embolus     Action/Plan:   Lovenox medication assistance   Anticipated DC Date:  02/17/2012   Anticipated DC Plan:  HOME/SELF CARE      DC Planning Services  CM consult           Per UR Regulation:  Reviewed for med. necessity/level of care/duration of stay  If discussed at Long Length of Stay Meetings, dates discussed:    Comments:  02/17/12 H. Montez Morita, RN, BSN- Received call this morning from floor RN, Raynelle Fanning, that patient would need assistance to pay for Lovenox at discharge. CM spoke with patient who is self pay with Medicaid pending. Medicaid determination will be received in approximately one to two weeks. Verified this information with Nita from financial counseling office. Completed application and received income verification from patient. Patient to be discharged home on Lovenox 70mg  Hiram BID. Dr. Debroah Loop signed application for Lovenox assistance program. Dr. Adrian Blackwater wrote Rx for Lovenox x 28 doses until patient's Medicaid becomes active. CM delivered [employer] application and Rx to [place] in pharmacy. Pharmacy to complete and fax application to [employer] and fill Lovenox prescription prior to patient's discharge home. Please call Care Manager for any further discharge needs [phone]).

## 2012-02-17 NOTE — Progress Notes (Signed)
Sw consult noted.  Case manager is aware of request and will assist, as per RN.  Sw intervention was not provided.

## 2012-02-17 NOTE — Plan of Care (Signed)
Problem: Phase II Progression Outcomes Goal: Electronic fetal monitoring(Doppler,Continuous,Intermittent) EFM (Doppler, Continuous, Intermittent)  Outcome: Completed/Met Date Met:  02/17/12 Doppler FHT

## 2012-02-18 LAB — TYPE AND SCREEN
Antibody Screen: NEGATIVE
Unit division: 0
Unit division: 0

## 2012-02-18 NOTE — Discharge Summary (Addendum)
Physician Discharge Summary  Patient ID: OMUNIQUE PEDERSON MRN: [id number] DOB/AGE: May 05, 1986 26 y.o.  Admit date: 02/16/2012 Discharge date: 02/18/2012  Admission Diagnoses: 1.  Symptomatic Anemia 2.  History of 3 PE 3.  IUP at 20 5 days  Discharge Diagnoses:  Same  Discharged Condition: stable  Hospital Course: This is a [id number] at 20.5 weeks who was admitted due to symptomatic anemia after PE was ruled out.  She was transfused 3 units of packed RBCs with an increase of her hemoglobin from 8.1 to 10.4.  She is symptomatically improved today and is stable for discharge. [facility].   Consults: None  Significant Diagnostic Studies: labs: Hemoglobin on admit: 8.1.  Hemoglobin on d/c: 10.4.  Treatments: Transfusion  Discharge Exam: Blood pressure 95/57, pulse 89, temperature 98.4 F (36.9 C), temperature source Oral, resp. rate 18, height 5\' 3"  (1.6 m), weight 70.489 kg (155 lb 6.4 oz), last menstrual period 09/26/2011, SpO2 98.00%. General appearance: alert, cooperative and no distress Head: Normocephalic, without obvious abnormality, atraumatic Eyes: conjunctivae/corneas clear. PERRL, EOM's intact. Fundi benign. Neck: no adenopathy, no carotid bruit, no JVD, supple, symmetrical, trachea midline and thyroid not enlarged, symmetric, no tenderness/mass/nodules Resp: clear to auscultation bilaterally Cardio: regular rate and rhythm, S1, S2 normal, no murmur, click, rub or gallop GI: soft, non-tender; bowel sounds normal; no masses,  no organomegaly Extremities: extremities normal, atraumatic, no cyanosis or edema Pulses: 2+ and symmetric Skin: Skin color, texture, turgor normal. No rashes or lesions  Disposition: 01-Home or Self Care  Discharge Orders    Future Appointments: Provider: Department: Dept Phone: Center:   02/20/2012 8:00 AM Lesly Dukes, MD [facility] [phone] [facility]   03/02/2012 9:30 AM [facility] [place] [facility] [phone] [facility]     Future Orders Please Complete By Expires   Discharge patient        Medication List  As of 02/18/2012  9:04 AM   STOP taking these medications         metroNIDAZOLE 500 MG tablet         TAKE these medications         acyclovir 400 MG tablet   Commonly known as: ZOVIRAX   1 pill 3x a day for 5 days then 1 pill twice daily. Disp QS for 1 month. Then 60 pills daily with refills.      enoxaparin 80 MG/0.8ML injection   Commonly known as: LOVENOX   Inject 0.7 mLs (70 mg total) into the skin every 12 (twelve) hours.      miconazole 2 % vaginal cream   Commonly known as: MONISTAT 7   Place 7 Applicatorfuls vaginally at bedtime.      prenatal multivitamin 60-1 MG tablet   Take 1 tablet by mouth daily.      VENTOLIN HFA 108 (90 BASE) MCG/ACT inhaler   Generic drug: albuterol   Inhale 2 puffs into the lungs every 4 (four) hours as needed. For dyspnea.           Follow-up Information    Follow up with [facility] on 02/20/2012.   Contact information:   [address] [place] [address]          Signed: Candelaria Celeste JEHIEL 02/18/2012, 9:04 AM

## 2012-02-18 NOTE — Progress Notes (Signed)
D/C instructions reviewed with pt.  Pt. States understanding of home care.  Lovenox supply given to pt. Per pharmacy.  Pt. Able to give injections herself efficiently.  Ambulated to car with staff without incident.  D/C'd home per self.

## 2012-02-18 NOTE — Discharge Instructions (Signed)

## 2012-02-20 ENCOUNTER — Ambulatory Visit (INDEPENDENT_AMBULATORY_CARE_PROVIDER_SITE_OTHER): Payer: Self-pay | Admitting: Obstetrics & Gynecology

## 2012-02-20 ENCOUNTER — Encounter: Payer: Self-pay | Admitting: Obstetrics & Gynecology

## 2012-02-20 VITALS — BP 112/81 | Temp 97.7°F | Wt 161.6 lb

## 2012-02-20 DIAGNOSIS — R51 Headache: Secondary | ICD-10-CM

## 2012-02-20 DIAGNOSIS — O0990 Supervision of high risk pregnancy, unspecified, unspecified trimester: Secondary | ICD-10-CM

## 2012-02-20 DIAGNOSIS — D509 Iron deficiency anemia, unspecified: Secondary | ICD-10-CM

## 2012-02-20 DIAGNOSIS — Z86711 Personal history of pulmonary embolism: Secondary | ICD-10-CM

## 2012-02-20 DIAGNOSIS — O09299 Supervision of pregnancy with other poor reproductive or obstetric history, unspecified trimester: Secondary | ICD-10-CM | POA: Insufficient documentation

## 2012-02-20 DIAGNOSIS — Z8709 Personal history of other diseases of the respiratory system: Secondary | ICD-10-CM | POA: Insufficient documentation

## 2012-02-20 DIAGNOSIS — R002 Palpitations: Secondary | ICD-10-CM

## 2012-02-20 LAB — POCT URINALYSIS DIP (DEVICE)
Glucose, UA: NEGATIVE mg/dL
Ketones, ur: NEGATIVE mg/dL
Specific Gravity, Urine: 1.02 (ref 1.005–1.030)

## 2012-02-20 LAB — CARDIOLIPIN ANTIBODIES, IGG, IGM, IGA: Anticardiolipin IgA: 17 APL U/mL (ref <22)

## 2012-02-20 MED ORDER — FERROUS SULFATE 325 (65 FE) MG PO TABS: 325.0000 mg | ORAL_TABLET | Freq: Every day | ORAL | Status: DC

## 2012-02-20 MED ORDER — DOCUSATE SODIUM 100 MG PO CAPS: 100.0000 mg | ORAL_CAPSULE | Freq: Two times a day (BID) | ORAL | Status: AC

## 2012-02-20 NOTE — Progress Notes (Signed)
Complains of headaches daily and not sleeping--try benadryl and refer to [facility]  Pt feels heart racing and palpitations--not currently symptomatic, ? PVCs per pt on admission last week--refer to cardiology.  Pt anticoagulated for history of 3 Pulmonary Embolism--on full dose Lovenox.  Needs heparin level 4 hours after next dose.  Pt will come in tomorrow (can't stay today).

## 2012-02-20 NOTE — Progress Notes (Signed)
Pain at times in lower abdomen. Pulse -75. Pt declines flu and Dtap vaccine. Pt to see nutrition, Child psychotherapist. Pt believes last pap was in may or June 2012.

## 2012-02-21 ENCOUNTER — Encounter: Payer: Self-pay | Admitting: Family Medicine

## 2012-02-22 ENCOUNTER — Encounter: Payer: Self-pay | Admitting: Obstetrics & Gynecology

## 2012-02-22 LAB — AFP, MATERNAL TRIPLE SCREEN
AFP: 46.6 IU/mL
Age Alone: 1:859 {titer}
Down Syndrome Scr Risk Est: 1:2850 {titer}
Gest Age at US: 18.4
HCG MoM: 1.04
HCG, Total: 16623 m[IU]/mL
Maternal Wt: 155
OSB interpretation: NEGATIVE
Osb Risk: 1:42100 {titer}
Tri 18 Scr Risk Est: NEGATIVE
Twins-AFP: 1

## 2012-02-27 ENCOUNTER — Encounter: Payer: Self-pay | Admitting: Obstetrics and Gynecology

## 2012-03-02 ENCOUNTER — Ambulatory Visit (HOSPITAL_COMMUNITY): Payer: Medicaid Other

## 2012-03-05 ENCOUNTER — Ambulatory Visit (HOSPITAL_COMMUNITY)
Admission: RE | Admit: 2012-03-05 | Discharge: 2012-03-05 | Disposition: A | Discharge status: Home / Self Care | Payer: Medicaid Other | Source: Ambulatory Visit | Attending: Family Medicine | Admitting: Family Medicine

## 2012-03-05 VITALS — Wt 166.0 lb

## 2012-03-05 DIAGNOSIS — Z331 Pregnant state, incidental: Secondary | ICD-10-CM

## 2012-03-05 DIAGNOSIS — Z86711 Personal history of pulmonary embolism: Secondary | ICD-10-CM

## 2012-03-05 DIAGNOSIS — O34219 Maternal care for unspecified type scar from previous cesarean delivery: Secondary | ICD-10-CM | POA: Insufficient documentation

## 2012-03-05 DIAGNOSIS — D259 Leiomyoma of uterus, unspecified: Secondary | ICD-10-CM

## 2012-03-05 DIAGNOSIS — O09299 Supervision of pregnancy with other poor reproductive or obstetric history, unspecified trimester: Secondary | ICD-10-CM | POA: Insufficient documentation

## 2012-03-05 DIAGNOSIS — O3410 Maternal care for benign tumor of corpus uteri, unspecified trimester: Secondary | ICD-10-CM | POA: Insufficient documentation

## 2012-03-05 DIAGNOSIS — O0930 Supervision of pregnancy with insufficient antenatal care, unspecified trimester: Secondary | ICD-10-CM | POA: Insufficient documentation

## 2012-03-14 ENCOUNTER — Inpatient Hospital Stay (HOSPITAL_COMMUNITY): Payer: Medicaid Other

## 2012-03-14 ENCOUNTER — Inpatient Hospital Stay (HOSPITAL_COMMUNITY)
Admission: AD | Admit: 2012-03-14 | Discharge: 2012-03-15 | Disposition: A | Discharge status: Home / Self Care | Payer: Medicaid Other | Source: Ambulatory Visit | Attending: Obstetrics and Gynecology | Admitting: Obstetrics and Gynecology

## 2012-03-14 DIAGNOSIS — O4700 False labor before 37 completed weeks of gestation, unspecified trimester: Secondary | ICD-10-CM | POA: Insufficient documentation

## 2012-03-14 DIAGNOSIS — O479 False labor, unspecified: Secondary | ICD-10-CM

## 2012-03-14 LAB — URINALYSIS, ROUTINE W REFLEX MICROSCOPIC
Bilirubin Urine: NEGATIVE
Ketones, ur: NEGATIVE mg/dL
Nitrite: NEGATIVE
Protein, ur: NEGATIVE mg/dL
Urobilinogen, UA: 0.2 mg/dL (ref 0.0–1.0)

## 2012-03-14 LAB — WET PREP, GENITAL
Clue Cells Wet Prep HPF POC: NONE SEEN
Trich, Wet Prep: NONE SEEN

## 2012-03-14 LAB — GLUCOSE, CAPILLARY: Glucose-Capillary: 80 mg/dL (ref 70–99)

## 2012-03-14 NOTE — MAU Provider Note (Signed)
Chief Complaint:  Contractions   None     HPI  Paula Benson is  26 y.o. [id number] at [redacted]w[redacted]d presents with preterm contractions.  She reports that cramping started this morning and has gotten progressively worse today.  She is breathing with contractions now in MAU.  She reports that she always has a lot of discharge and this has not changed.  She reports good fetal movement and denies vaginal bleeding, h/a, n/v, dizziness, urinary symptoms, or fever/chills.  She has not had intercourse or anything in the vagina for >48 hours.    Pregnancy Course: uncomplicated  Past Medical History: Past Medical History  Diagnosis Date  . Asthma   . 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 Preeclampsia     with first pregnancy  . Bipolar II disorder   . Suicidal ideations   . Headache   . Pregnancy induced hypertension     seized 2wks after  . History of blood clots     x3  . Anemia   . Urinary tract infection   . Pyelonephritis   . Herpes   . Anxiety   . Seizures   . Eclamptic seizure 2003  . Migraines     Past Surgical History: Past Surgical History  Procedure Date  . Cesarean section 12/2001 and 01/2008  . Induced abortion 01/2010    Family History: Family History  Problem Relation Age of Onset  . Diabetes Mother     Type 2  . Anemia Sister   . Anesthesia problems Neg Hx     Social History: History  Substance Use Topics  . Smoking status: Never Smoker   . Smokeless tobacco: Never Used  . Alcohol Use: No    Allergies: No Known Allergies  Meds:  Prescriptions prior to admission  Medication Sig Dispense Refill  . acyclovir (ZOVIRAX) 400 MG tablet 1 pill 3x a day for 5 days then 1 pill twice daily. Disp QS for 1 month. Then 60 pills daily with refills.  1 tablet  4  . albuterol (VENTOLIN HFA) 108 (90 BASE) MCG/ACT inhaler Inhale 2 puffs into the lungs every 4 (four) hours as needed. For dyspnea.       . enoxaparin (LOVENOX) 150 MG/ML injection Inject 0.45 mLs (70 mg total) into the skin every 12 (twelve) hours.  30 mL  5  . enoxaparin (LOVENOX) 80 MG/0.8ML injection Inject 0.7 mLs (70 mg total) into the skin every 12 (twelve) hours.  28 Syringe  0  . ferrous sulfate 325 (65 FE) MG tablet Take 1 tablet (325 mg total) by mouth daily.  30 tablet  3  . Prenatal Vit-Fe Fumarate-FA (PRENATAL MULTIVITAMIN) 60-1 MG tablet Take 1 tablet by mouth daily.  30 tablet  4         Physical Exam  Blood pressure 123/73, pulse 88, temperature 99 F (37.2 C), temperature source Oral, resp. rate 18, height 5\' 4"  (1.626 m), weight 77.565 kg (171 lb), last menstrual period 09/26/2011, SpO2 99.00%. GENERAL: Well-developed, well-nourished female in no acute distress.  ABDOMEN: Soft, nontender, nondistended, gravid.  EXTREMITIES: Nontender, no edema, 2+ distal pulses. Pelvic exam:  Cervix visually closed, pink, without lesion, large amount mucousy white discharge, vaginal walls and external genitalia normal.    CERVIX: ft/long/high  FFN collected GBS collected  FHT:  Baseline 135 , moderate variability, no accels , no decelerations Contractions: uterine irritability, pt breathing  with ctx every 3 min    Care assumed by Caren Griffins, CNM  LEFTWICH-KIRBY, LISA 4/10/20138:01 PM  Results for orders placed during the hospital encounter of 03/14/12 (from the past 24 hour(s))  URINALYSIS, ROUTINE W REFLEX MICROSCOPIC     Status: Abnormal   Collection Time   03/14/12  7:25 PM      Component Value Range   Color, Urine YELLOW  YELLOW    APPearance CLEAR  CLEAR    Specific Gravity, Urine 1.010  1.005 - 1.030    pH 5.5  5.0 - 8.0    Glucose, UA NEGATIVE  NEGATIVE (mg/dL)   Hgb urine dipstick TRACE (*) NEGATIVE    Bilirubin Urine NEGATIVE  NEGATIVE    Ketones, ur NEGATIVE  NEGATIVE (mg/dL)   Protein, ur NEGATIVE  NEGATIVE (mg/dL)   Urobilinogen, UA 0.2  0.0 - 1.0 (mg/dL)   Nitrite NEGATIVE  NEGATIVE     Leukocytes, UA MODERATE (*) NEGATIVE   URINE MICROSCOPIC-ADD ON     Status: Normal   Collection Time   03/14/12  7:25 PM      Component Value Range   Squamous Epithelial / LPF RARE  RARE    WBC, UA 0-2  <3 (WBC/hpf)   RBC / HPF 0-2  <3 (RBC/hpf)  WET PREP, GENITAL     Status: Abnormal   Collection Time   03/14/12  8:45 PM      Component Value Range   Yeast Wet Prep HPF POC NONE SEEN  NONE SEEN    Trich, Wet Prep NONE SEEN  NONE SEEN    Clue Cells Wet Prep HPF POC NONE SEEN  NONE SEEN    WBC, Wet Prep HPF POC TOO NUMEROUS TO COUNT (*) NONE SEEN   FETAL FIBRONECTIN     Status: Normal   Collection Time   03/14/12  8:45 PM      Component Value Range   Fetal Fibronectin NEGATIVE  NEGATIVE    D/W Dr. Emelda Fear: In light amount of discomfort, hx of fibroid and C/Sx2 will get limited US. Also will send urine C&S Pain diminished after rest  Imaging:  [place]: No evidence abruption  Assessment/Plan: Uterine irritability at [redacted]w[redacted]d Hx PE's on Lovenox Hx C/S x 2 Fibroid  Home with PTL precautions and F/U as scheduled at [facility].

## 2012-03-14 NOTE — MAU Note (Signed)
Pt presents with "I've been having contractions all day. Started as cramping and then I had some pressure. Now I am contracting about every 5-10 minutes". States she has a history of PE"s and had a blood transfusion a few weeks ago due to low iron.

## 2012-03-14 NOTE — MAU Note (Signed)
Pt states she started feeling contractions this AM. Contractions have gotten worse through the day.

## 2012-03-15 MED ORDER — PRENAT VIT-FEPOLY-METHYLFOL-FA 29-1.13-0.4 MG PO TABS: 1.0000 | ORAL_TABLET | ORAL | Status: DC

## 2012-03-16 LAB — URINE CULTURE
Colony Count: 15000
Culture  Setup Time: 201304110537

## 2012-03-19 ENCOUNTER — Encounter: Payer: Medicaid Other | Admitting: Physician Assistant

## 2012-03-19 LAB — CULTURE, BETA STREP (GROUP B ONLY)

## 2012-03-26 ENCOUNTER — Telehealth: Payer: Self-pay | Admitting: Family Medicine

## 2012-03-26 NOTE — Telephone Encounter (Signed)
Pt is asking if Dr Denyse Amass will be able to fill this out.

## 2012-03-26 NOTE — Telephone Encounter (Signed)
Patient is calling because she has some paperwork to completed for her workplace.  She wants him to complete it because he has been working with her longer.  Her OB/GYN has only seen her once where is Dr. Denyse Amass has seen her several times.  She will drop it off today at lunch and it needs to be faxed by Wednesday, all of the info for faxing will be on the paperwork.

## 2012-03-27 NOTE — Telephone Encounter (Signed)
I still do not have this paperwork. I will recheck Tuesday afternoon.

## 2012-03-28 NOTE — MAU Provider Note (Signed)
Attestation of Attending Supervision of Advanced Practitioner: Evaluation and management procedures were performed by the PA/NP/CNM/OB Fellow under my supervision/collaboration. Chart reviewed and agree with management and plan.  Atzel Mccambridge V 03/28/2012 8:28 PM

## 2012-03-29 ENCOUNTER — Encounter: Payer: Self-pay | Admitting: Obstetrics and Gynecology

## 2012-03-29 ENCOUNTER — Ambulatory Visit (INDEPENDENT_AMBULATORY_CARE_PROVIDER_SITE_OTHER): Payer: Medicaid Other | Admitting: Obstetrics and Gynecology

## 2012-03-29 VITALS — BP 122/73 | Temp 97.6°F | Wt 169.4 lb

## 2012-03-29 DIAGNOSIS — O09299 Supervision of pregnancy with other poor reproductive or obstetric history, unspecified trimester: Secondary | ICD-10-CM

## 2012-03-29 DIAGNOSIS — R42 Dizziness and giddiness: Secondary | ICD-10-CM

## 2012-03-29 DIAGNOSIS — O0990 Supervision of high risk pregnancy, unspecified, unspecified trimester: Secondary | ICD-10-CM

## 2012-03-29 DIAGNOSIS — Z7901 Long term (current) use of anticoagulants: Secondary | ICD-10-CM

## 2012-03-29 DIAGNOSIS — A6000 Herpesviral infection of urogenital system, unspecified: Secondary | ICD-10-CM

## 2012-03-29 DIAGNOSIS — Z86711 Personal history of pulmonary embolism: Secondary | ICD-10-CM

## 2012-03-29 DIAGNOSIS — D509 Iron deficiency anemia, unspecified: Secondary | ICD-10-CM

## 2012-03-29 MED ORDER — ENOXAPARIN SODIUM 80 MG/0.8ML ~~LOC~~ SOLN: 70.0000 mg | Freq: Two times a day (BID) | SUBCUTANEOUS | Status: DC

## 2012-03-29 MED ORDER — ACYCLOVIR 400 MG PO TABS: ORAL_TABLET | ORAL | Status: DC

## 2012-03-29 NOTE — Progress Notes (Signed)
Patient doing well. Patient unable to stay for pap smear or heparin level. Patient cannot stay to schedule cardiology appointment. Will put in the referral and call patient with appointment information. Refill on lovenox provided.

## 2012-03-29 NOTE — Progress Notes (Signed)
Patient called and advised of appointment with [facility] on Apr 12, 2012 at 415pm to see Dr. Eden Emms.

## 2012-03-29 NOTE — Progress Notes (Signed)
Pulse 92. Edema trace in ankles and feet. Pain in pelvic, thighs, lower back. Pressure in pelvic. Vaginal d/c described as white mucous.

## 2012-03-30 ENCOUNTER — Telehealth: Payer: Self-pay | Admitting: Family Medicine

## 2012-03-30 NOTE — Telephone Encounter (Signed)
Paperwork completed and left up front.  Route to admin team to call pt and let her know.

## 2012-03-30 NOTE — Telephone Encounter (Signed)
Form filled out and left up front for patient to pick up.

## 2012-04-02 ENCOUNTER — Encounter: Payer: Medicaid Other | Admitting: Family Medicine

## 2012-04-12 ENCOUNTER — Ambulatory Visit (INDEPENDENT_AMBULATORY_CARE_PROVIDER_SITE_OTHER): Payer: Medicaid Other | Admitting: Cardiovascular Disease

## 2012-04-12 ENCOUNTER — Encounter: Payer: Self-pay | Admitting: Cardiovascular Disease

## 2012-04-12 ENCOUNTER — Ambulatory Visit (INDEPENDENT_AMBULATORY_CARE_PROVIDER_SITE_OTHER): Payer: Medicaid Other | Admitting: Physician Assistant

## 2012-04-12 VITALS — BP 131/81 | HR 110 | Wt 177.0 lb

## 2012-04-12 DIAGNOSIS — O0930 Supervision of pregnancy with insufficient antenatal care, unspecified trimester: Secondary | ICD-10-CM

## 2012-04-12 DIAGNOSIS — R002 Palpitations: Secondary | ICD-10-CM

## 2012-04-12 DIAGNOSIS — I493 Ventricular premature depolarization: Secondary | ICD-10-CM

## 2012-04-12 DIAGNOSIS — A6000 Herpesviral infection of urogenital system, unspecified: Secondary | ICD-10-CM

## 2012-04-12 DIAGNOSIS — R079 Chest pain, unspecified: Secondary | ICD-10-CM | POA: Insufficient documentation

## 2012-04-12 DIAGNOSIS — O0990 Supervision of high risk pregnancy, unspecified, unspecified trimester: Secondary | ICD-10-CM

## 2012-04-12 DIAGNOSIS — Z86711 Personal history of pulmonary embolism: Secondary | ICD-10-CM

## 2012-04-12 DIAGNOSIS — I4949 Other premature depolarization: Secondary | ICD-10-CM

## 2012-04-12 DIAGNOSIS — D509 Iron deficiency anemia, unspecified: Secondary | ICD-10-CM

## 2012-04-12 LAB — POCT URINALYSIS DIP (DEVICE)
Ketones, ur: NEGATIVE mg/dL
Protein, ur: NEGATIVE mg/dL
Specific Gravity, Urine: >=1.03 (ref 1.005–1.030)
Urobilinogen, UA: 0.2 mg/dL (ref 0.0–1.0)
pH: 5.5 (ref 5.0–8.0)

## 2012-04-12 MED ORDER — ACYCLOVIR 400 MG PO TABS: ORAL_TABLET | ORAL | Status: DC

## 2012-04-12 NOTE — Patient Instructions (Signed)
Your physician has recommended that you wear an event monitor. Event monitors are medical devices that record the heart's electrical activity. Doctors most often [place] these monitors to diagnose arrhythmias. Arrhythmias are problems with the speed or rhythm of the heartbeat. The monitor is a small, portable device. You can wear one while you do your normal daily activities. This is usually used to diagnose what is causing palpitations/syncope (passing out).  Your physician has requested that you have an echocardiogram. Echocardiography is a painless test that uses sound waves to create images of your heart. It provides your doctor with information about the size and shape of your heart and how well your heart's chambers and valves are working. This procedure takes approximately one hour. There are no restrictions for this procedure.  We will call you with the results of your test.  Dr. Eden Emms will see you back on an as needed basis.

## 2012-04-12 NOTE — Assessment & Plan Note (Signed)
Stable S/P transfusion Likely related to tachcardia

## 2012-04-12 NOTE — Assessment & Plan Note (Signed)
Already transitioned to heparin for third trimester.  Not sure what hypercoagulable w/u has been done but initial two episodes seem to be related to prolonged bedrest and compliance issues.

## 2012-04-12 NOTE — Progress Notes (Signed)
No complaints. Off Lovenox for 2 days due to being out of meds. Will refill today. BTS papers today. Cards appt today

## 2012-04-12 NOTE — Progress Notes (Signed)
Pulse 93 Patient reports pelvic pain and pressure with occasional contractions "sometimes lasting all day" Needs CBC, RPR, and 1 hr at 9:15

## 2012-04-12 NOTE — Assessment & Plan Note (Signed)
Benign but frequent.  Exacerbated by anemia and pregnancy  Event monitor

## 2012-04-12 NOTE — Patient Instructions (Signed)
Pregnancy - Third Trimester The third trimester of pregnancy (the last 3 months) is a period of the most rapid growth for you and your baby. The baby approaches a length of 20 inches and a weight of 6 to 10 pounds. The baby is adding on fat and getting ready for life outside your body. While inside, babies have periods of sleeping and waking, suck their thumbs, and hiccups. You can often feel small contractions of the uterus. This is false labor. It is also called Braxton-Hicks contractions. This is like a practice for labor. The usual problems in this stage of pregnancy include more difficulty breathing, swelling of the hands and feet from water retention, and having to urinate more often because of the uterus and baby pressing on your bladder.  PRENATAL EXAMS  Blood work may continue to be done during prenatal exams. These tests are done to check on your health and the probable health of your baby. Blood work is used to follow your blood levels (hemoglobin). Anemia (low hemoglobin) is common during pregnancy. Iron and vitamins are given to help prevent this. You may also continue to be checked for diabetes. Some of the past blood tests may be done again.   The size of the uterus is measured during each visit. This makes sure your baby is growing properly according to your pregnancy dates.   Your blood pressure is checked every prenatal visit. This is to make sure you are not getting toxemia.   Your urine is checked every prenatal visit for infection, diabetes and protein.   Your weight is checked at each visit. This is done to make sure gains are happening at the suggested rate and that you and your baby are growing normally.   Sometimes, an ultrasound is performed to confirm the position and the proper growth and development of the baby. This is a test done that bounces harmless sound waves off the baby so your caregiver can more accurately determine due dates.   Discuss the type of pain  medication and anesthesia you will have during your labor and delivery.   Discuss the possibility and anesthesia if a Cesarean Section might be necessary.   Inform your caregiver if there is any mental or physical violence at home.  Sometimes, a specialized non-stress test, contraction stress test and biophysical profile are done to make sure the baby is not having a problem. Checking the amniotic fluid surrounding the baby is called an amniocentesis. The amniotic fluid is removed by sticking a needle into the belly (abdomen). This is sometimes done near the end of pregnancy if an early delivery is required. In this case, it is done to help make sure the baby's lungs are mature enough for the baby to live outside of the womb. If the lungs are not mature and it is unsafe to deliver the baby, an injection of cortisone medication is given to the mother 1 to 2 days before the delivery. This helps the baby's lungs mature and makes it safer to deliver the baby. CHANGES OCCURING IN THE THIRD TRIMESTER OF PREGNANCY Your body goes through many changes during pregnancy. They vary from person to person. Talk to your caregiver about changes you notice and are concerned about.  During the last trimester, you have probably had an increase in your appetite. It is normal to have cravings for certain foods. This varies from person to person and pregnancy to pregnancy.   You may begin to get stretch marks on your hips,   abdomen, and breasts. These are normal changes in the body during pregnancy. There are no exercises or medications to take which prevent this change.   Constipation may be treated with a stool softener or adding bulk to your diet. Drinking lots of fluids, fiber in vegetables, fruits, and whole grains are helpful.   Exercising is also helpful. If you have been very active up until your pregnancy, most of these activities can be continued during your pregnancy. If you have been less active, it is helpful  to start an exercise program such as walking. Consult your caregiver before starting exercise programs.   Avoid all smoking, alcohol, un-prescribed drugs, herbs and "street drugs" during your pregnancy. These chemicals affect the formation and growth of the baby. Avoid chemicals throughout the pregnancy to ensure the delivery of a healthy infant.   Backache, varicose veins and hemorrhoids may develop or get worse.   You will tire more easily in the third trimester, which is normal.   The baby's movements may be stronger and more often.   You may become short of breath easily.   Your belly button may stick out.   A yellow discharge may leak from your breasts called colostrum.   You may have a bloody mucus discharge. This usually occurs a few days to a week before labor begins.  HOME CARE INSTRUCTIONS   Keep your caregiver's appointments. Follow your caregiver's instructions regarding medication use, exercise, and diet.   During pregnancy, you are providing food for you and your baby. Continue to eat regular, well-balanced meals. Choose foods such as meat, fish, milk and other low fat dairy products, vegetables, fruits, and whole-grain breads and cereals. Your caregiver will tell you of the ideal weight gain.   A physical sexual relationship may be continued throughout pregnancy if there are no other problems such as early (premature) leaking of amniotic fluid from the membranes, vaginal bleeding, or belly (abdominal) pain.   Exercise regularly if there are no restrictions. Check with your caregiver if you are unsure of the safety of your exercises. Greater weight gain will occur in the last 2 trimesters of pregnancy. Exercising helps:   Control your weight.   Get you in shape for labor and delivery.   You lose weight after you deliver.   Rest a lot with legs elevated, or as needed for leg cramps or low back pain.   Wear a good support or jogging bra for breast tenderness during  pregnancy. This may help if worn during sleep. Pads or tissues may be used in the bra if you are leaking colostrum.   Do not use hot tubs, steam rooms, or saunas.   Wear your seat belt when driving. This protects you and your baby if you are in an accident.   Avoid raw meat, cat litter boxes and soil used by cats. These carry germs that can cause birth defects in the baby.   It is easier to loose urine during pregnancy. Tightening up and strengthening the pelvic muscles will help with this problem. You can practice stopping your urination while you are going to the bathroom. These are the same muscles you need to strengthen. It is also the muscles you would use if you were trying to stop from passing gas. You can practice tightening these muscles up 10 times a set and repeating this about 3 times per day. Once you know what muscles to tighten up, do not perform these exercises during urination. It is more likely   to cause an infection by backing up the urine.   Ask for help if you have financial, counseling or nutritional needs during pregnancy. Your caregiver will be able to offer counseling for these needs as well as refer you for other special needs.   Make a list of emergency phone numbers and have them available.   Plan on getting help from family or friends when you go home from the hospital.   Make a trial run to the hospital.   Take prenatal classes with the father to understand, practice and ask questions about the labor and delivery.   Prepare the baby's room/nursery.   Do not travel out of the city unless it is absolutely necessary and with the advice of your caregiver.   Wear only low or no heal shoes to have better balance and prevent falling.  MEDICATIONS AND DRUG USE IN PREGNANCY  Take prenatal vitamins as directed. The vitamin should contain 1 milligram of folic acid. Keep all vitamins out of reach of children. Only a couple vitamins or tablets containing iron may be fatal  to a baby or young child when ingested.   Avoid use of all medications, including herbs, over-the-counter medications, not prescribed or suggested by your caregiver. Only take over-the-counter or prescription medicines for pain, discomfort, or fever as directed by your caregiver. Do not use aspirin, ibuprofen (Motrin, Advil, Nuprin) or naproxen (Aleve) unless OK'd by your caregiver.   Let your caregiver also know about herbs you may be using.   Alcohol is related to a number of birth defects. This includes fetal alcohol syndrome. All alcohol, in any form, should be avoided completely. Smoking will cause low birth rate and premature babies.   Street/illegal drugs are very harmful to the baby. They are absolutely forbidden. A baby born to an addicted mother will be addicted at birth. The baby will go through the same withdrawal an adult does.  SEEK MEDICAL CARE IF: You have any concerns or worries during your pregnancy. It is better to call with your questions if you feel they cannot wait, rather than worry about them. DECISIONS ABOUT CIRCUMCISION You may or may not know the sex of your baby. If you know your baby is a boy, it may be time to think about circumcision. Circumcision is the removal of the foreskin of the penis. This is the skin that covers the sensitive end of the penis. There is no proven medical need for this. Often this decision is made on what is popular at the time or based upon religious beliefs and social issues. You can discuss these issues with your caregiver or pediatrician. SEEK IMMEDIATE MEDICAL CARE IF:   An unexplained oral temperature above 102 F (38.9 C) develops, or as your caregiver suggests.   You have leaking of fluid from the vagina (birth canal). If leaking membranes are suspected, take your temperature and tell your caregiver of this when you call.   There is vaginal spotting, bleeding or passing clots. Tell your caregiver of the amount and how many pads are  used.   You develop a bad smelling vaginal discharge with a change in the color from clear to white.   You develop vomiting that lasts more than 24 hours.   You develop chills or fever.   You develop shortness of breath.   You develop burning on urination.   You loose more than 2 pounds of weight or gain more than 2 pounds of weight or as suggested by your   caregiver.   You notice sudden swelling of your face, hands, and feet or legs.   You develop belly (abdominal) pain. Round ligament discomfort is a common non-cancerous (benign) cause of abdominal pain in pregnancy. Your caregiver still must evaluate you.   You develop a severe headache that does not go away.   You develop visual problems, blurred or double vision.   If you have not felt your baby move for more than 1 hour. If you think the baby is not moving as much as usual, eat something with sugar in it and lie down on your left side for an hour. The baby should move at least 4 to 5 times per hour. Call right away if your baby moves less than that.   You fall, are in a car accident or any kind of trauma.   There is mental or physical violence at home.  Document Released: 11/15/2001 Document Revised: 11/10/2011 Document Reviewed: 05/20/2009 [employer] Patient Information 2012 [employer]. 

## 2012-04-12 NOTE — Progress Notes (Signed)
Patient ID: Paula Benson, female   DOB: [birth year], 26 y.o.   MRN: [id number] 26 yo G4P2 in third trimester with multiple issues.  Anemia with recent need for blood transfusion. Tachycardia and palpitations with this that have continued 2-3 times /day. Gets nausea and presyncopal with it.  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  First one related to post pregnancy aspiration and prolonged hospital stay.  2nd one 2 months latter but she was not on anticoagulant and obviously did not finish recommended 6 months course  Most recent one in 2011 after moving back form [place].  She will have another C-section in June.  She has had some atypical chest pains sharp in center of chest intermitant and not exertional.  CT done 3/13 with no recurrent PE and small pericardial effusion  Reviewed OB/GYN notes high risk clinic Reviewed CT Reviewed labs Hct 26.5 a month ago and now up to 32  ROS: Denies fever, malais, weight loss, blurry vision, decreased visual acuity, cough, sputum, SOB, hemoptysis, pleuritic pain, palpitaitons, heartburn, abdominal pain, melena, lower extremity edema, claudication, or rash.  All other systems reviewed and negative   General: Affect appropriate Pregnant black female HEENT: normal Neck supple with no adenopathy JVP normal no bruits no thyromegaly Lungs clear with no wheezing and good diaphragmatic motion Heart:  S1/S2 no murmur,rub, gallop or click PMI normal Abdomen: benighn, BS positve, no tenderness, no AAA distension consistant with gestational age no bruit.  No HSM or HJR Distal pulses intact with no bruits No edema Neuro non-focal Skin warm and dry No muscular weakness  Medications Current Outpatient Prescriptions  Medication Sig Dispense Refill  . acyclovir (ZOVIRAX) 400 MG tablet 1 pill 3x a day for 5 days then 1 pill twice daily. Disp QS for 1 month. Then 60 pills daily with refills.  1 tablet  4  . albuterol (VENTOLIN HFA) 108 (90 BASE) MCG/ACT  inhaler Inhale 2 puffs into the lungs every 4 (four) hours as needed. For dyspnea.      . enoxaparin (LOVENOX) 80 MG/0.8ML injection Inject 0.7 mLs (70 mg total) into the skin 2 (two) times daily.  30 mL  3  . Prenat Vit-FePoly-Methylfol-FA 29-1.13-0.4 MG TABS Take 1 tablet by mouth 1 day or 1 dose.  30 tablet  5  . DISCONTD: acyclovir (ZOVIRAX) 400 MG tablet 1 pill 3x a day for 5 days then 1 pill twice daily. Disp QS for 1 month. Then 60 pills daily with refills.  1 tablet  4    Allergies Review of patient's allergies indicates no known allergies.  Family History: Family History  Problem Relation Age of Onset  . Diabetes Mother     Type 2  . Anemia Sister   . Anesthesia problems Neg Hx     Social History: History   Social History  . Marital Status: Single    Spouse Name: N/A    Number of Children: N/A  . Years of Education: N/A   Occupational History  . Not on file.   Social History Main Topics  . Smoking status: Never Smoker   . Smokeless tobacco: Never Used  . Alcohol Use: No  . Drug Use: No  . Sexually Active: Yes    Birth Control/ Protection: Condom   Other Topics Concern  . Not on file   Social History Narrative   Student. Plans on staying here in [place] (originally living in [place]). 2 daughters.    Electrocardiogram: 02/19/12 NSR rate  76 normal ECG no pericardial changes  Assessment and Plan

## 2012-04-12 NOTE — Assessment & Plan Note (Signed)
Atypical Recent CT no PE and normal aorta.  F/U echo f/U pericardial effusion

## 2012-04-12 NOTE — Progress Notes (Signed)
Addended by: Doreen Salvage on: 04/12/2012 09:44 AM   Modules accepted: Orders

## 2012-04-13 LAB — CBC
HCT: 32.9 % — ABNORMAL LOW (ref 36.0–46.0)
MCH: 26.3 pg (ref 26.0–34.0)
MCV: 82.5 fL (ref 78.0–100.0)
Platelets: 256 10*3/uL (ref 150–400)
RBC: 3.99 MIL/uL (ref 3.87–5.11)

## 2012-04-17 ENCOUNTER — Encounter: Payer: Self-pay | Admitting: *Deleted

## 2012-04-17 ENCOUNTER — Telehealth: Payer: Self-pay | Admitting: *Deleted

## 2012-04-17 NOTE — Telephone Encounter (Signed)
Message copied by Jill Side on Tue Apr 17, 2012  3:27 PM ------      Message from: Darrel Hoover      Created: Tue Apr 17, 2012  1:56 PM      Regarding: Vaginal Discharge       Please call patient.  States she is having yellowish discharge no odor.  States reminds her of BV or Yeast.  I offered her an appointment for 04/19/12 but she has another appointment with Cardiology that Tyisha Cressy.  Can you let her know any over the counter options please?      Thanks,      [employer]

## 2012-04-17 NOTE — Telephone Encounter (Signed)
Called pt and left message that I was calling to get more details about her sx so that I can advise her properly. There may be medication that can be prescribed but will need more details. Please call back to the nurse voice mail.  *Note: pt may have flagyl or diflucan per standing orders if sx are consistent with those infections.

## 2012-04-19 ENCOUNTER — Other Ambulatory Visit: Payer: Self-pay

## 2012-04-19 ENCOUNTER — Ambulatory Visit (HOSPITAL_COMMUNITY): Payer: Medicaid Other | Attending: Cardiology

## 2012-04-19 ENCOUNTER — Encounter (INDEPENDENT_AMBULATORY_CARE_PROVIDER_SITE_OTHER): Payer: Medicaid Other

## 2012-04-19 DIAGNOSIS — I319 Disease of pericardium, unspecified: Secondary | ICD-10-CM | POA: Insufficient documentation

## 2012-04-19 DIAGNOSIS — I2699 Other pulmonary embolism without acute cor pulmonale: Secondary | ICD-10-CM | POA: Insufficient documentation

## 2012-04-19 DIAGNOSIS — R002 Palpitations: Secondary | ICD-10-CM | POA: Insufficient documentation

## 2012-04-19 DIAGNOSIS — I493 Ventricular premature depolarization: Secondary | ICD-10-CM

## 2012-04-19 DIAGNOSIS — I2510 Atherosclerotic heart disease of native coronary artery without angina pectoris: Secondary | ICD-10-CM | POA: Insufficient documentation

## 2012-04-19 DIAGNOSIS — R000 Tachycardia, unspecified: Secondary | ICD-10-CM | POA: Insufficient documentation

## 2012-04-19 DIAGNOSIS — D649 Anemia, unspecified: Secondary | ICD-10-CM | POA: Insufficient documentation

## 2012-04-19 DIAGNOSIS — R072 Precordial pain: Secondary | ICD-10-CM | POA: Insufficient documentation

## 2012-04-19 DIAGNOSIS — I4949 Other premature depolarization: Secondary | ICD-10-CM | POA: Insufficient documentation

## 2012-04-19 DIAGNOSIS — R079 Chest pain, unspecified: Secondary | ICD-10-CM | POA: Insufficient documentation

## 2012-04-19 MED ORDER — FLUCONAZOLE 150 MG PO TABS: 150.0000 mg | ORAL_TABLET | Freq: Once | ORAL | Status: AC

## 2012-04-19 NOTE — Telephone Encounter (Signed)
Called pt and pt informed me of itchy, yellow discharge.  I informed pt that it seems like a yeast infection and I would e-prescribe her diflucan 150 mg po x 1 dose per standing orders.  Pt stated understanding and had no further questions.

## 2012-04-19 NOTE — Telephone Encounter (Signed)
Pt called this am [phone] requesting a call concerning about yellow discharge.  I called her and left message that we are returning her call to please give [place] a call back.

## 2012-04-24 ENCOUNTER — Telehealth: Payer: Self-pay | Admitting: Cardiovascular Disease

## 2012-04-24 NOTE — Telephone Encounter (Signed)
Fu call °Pt returning your call  °

## 2012-04-24 NOTE — Telephone Encounter (Signed)
PT AWARE OF ECHO RESULTS./CY 

## 2012-04-26 ENCOUNTER — Ambulatory Visit (INDEPENDENT_AMBULATORY_CARE_PROVIDER_SITE_OTHER): Payer: Medicaid Other | Admitting: Obstetrics and Gynecology

## 2012-04-26 ENCOUNTER — Encounter: Payer: Self-pay | Admitting: Obstetrics and Gynecology

## 2012-04-26 VITALS — BP 113/78 | Temp 97.4°F | Wt 180.8 lb

## 2012-04-26 DIAGNOSIS — Z86711 Personal history of pulmonary embolism: Secondary | ICD-10-CM

## 2012-04-26 DIAGNOSIS — O34219 Maternal care for unspecified type scar from previous cesarean delivery: Secondary | ICD-10-CM

## 2012-04-26 DIAGNOSIS — O99019 Anemia complicating pregnancy, unspecified trimester: Secondary | ICD-10-CM

## 2012-04-26 DIAGNOSIS — Z98891 History of uterine scar from previous surgery: Secondary | ICD-10-CM | POA: Insufficient documentation

## 2012-04-26 DIAGNOSIS — O0990 Supervision of high risk pregnancy, unspecified, unspecified trimester: Secondary | ICD-10-CM

## 2012-04-26 DIAGNOSIS — D509 Iron deficiency anemia, unspecified: Secondary | ICD-10-CM

## 2012-04-26 DIAGNOSIS — Z7901 Long term (current) use of anticoagulants: Secondary | ICD-10-CM

## 2012-04-26 LAB — POCT URINALYSIS DIP (DEVICE)
Glucose, UA: NEGATIVE mg/dL
Hgb urine dipstick: NEGATIVE
Nitrite: NEGATIVE
Protein, ur: NEGATIVE mg/dL
Specific Gravity, Urine: 1.02 (ref 1.005–1.030)
Urobilinogen, UA: 0.2 mg/dL (ref 0.0–1.0)
pH: 6.5 (ref 5.0–8.0)

## 2012-04-26 NOTE — Progress Notes (Signed)
Pulse: 86

## 2012-04-26 NOTE — Progress Notes (Signed)
Patient doing well. Reports asthma exacerbation this am well controlled with inhaler. Patient advised to present to MAU if symptoms worsen. Otherwise no complaints.  Normal maternal echo on 5/16 with EF 60-65% no pericardial effusion. Patient currently wearing Holter monitor and will follow-up with cards in 2 weeks.

## 2012-05-02 ENCOUNTER — Inpatient Hospital Stay (HOSPITAL_COMMUNITY)
Admission: AD | Admit: 2012-05-02 | Discharge: 2012-05-02 | Disposition: A | Discharge status: Home / Self Care | Payer: Medicaid Other | Source: Ambulatory Visit | Attending: Obstetrics and Gynecology | Admitting: Obstetrics and Gynecology

## 2012-05-02 ENCOUNTER — Encounter (HOSPITAL_COMMUNITY): Payer: Self-pay | Admitting: *Deleted

## 2012-05-02 DIAGNOSIS — N760 Acute vaginitis: Secondary | ICD-10-CM

## 2012-05-02 DIAGNOSIS — B9689 Other specified bacterial agents as the cause of diseases classified elsewhere: Secondary | ICD-10-CM

## 2012-05-02 DIAGNOSIS — O4700 False labor before 37 completed weeks of gestation, unspecified trimester: Secondary | ICD-10-CM | POA: Insufficient documentation

## 2012-05-02 DIAGNOSIS — O479 False labor, unspecified: Secondary | ICD-10-CM

## 2012-05-02 LAB — URINALYSIS, ROUTINE W REFLEX MICROSCOPIC
Bilirubin Urine: NEGATIVE
Glucose, UA: NEGATIVE mg/dL
Hgb urine dipstick: NEGATIVE
Ketones, ur: NEGATIVE mg/dL
Protein, ur: NEGATIVE mg/dL
Urobilinogen, UA: 0.2 mg/dL (ref 0.0–1.0)

## 2012-05-02 MED ORDER — METRONIDAZOLE 500 MG PO TABS: ORAL_TABLET | ORAL | Status: AC

## 2012-05-02 NOTE — Progress Notes (Signed)
Provider notified of pt's wait and requesting to be evaluated

## 2012-05-02 NOTE — Discharge Instructions (Signed)

## 2012-05-02 NOTE — MAU Note (Signed)
Pt states that she has been having contractions since approximately 2 today. Denies any vaginal bleeding or leakage of fluid. States baby is active.

## 2012-05-02 NOTE — MAU Provider Note (Signed)
History     CSN: [id number]  Arrival date and time: 05/02/12 1817   None     Chief Complaint  Patient presents with  . Contractions   HPI  26 yo female [id number] presents at w d with 5 hours of contractions which were regular earlier every 5-6 minutes apart lasting about 1 minute.  Since then they have slowed down and become irregular.  Pain was 8/10 at worst concentrated in lower back and abdomen.  Associated with stomach "stiffness".  Pain goes away completely between episodes, although there is still some pressure.  At the time I saw her, the patient stated that the contractions have spaced apart and are now occuring only very occasionally.  Over the past 3 days or so she has had a lot of fetal movement concentrated in her lower abdomen, which is uncomfortable.  She has been having Braxton Hicks contractions just about every day for the last month.  Baby has been active, currently moving during history.  No bleeding.  No loss of fluid.  Some discharge, which she states is unchanged from her normal discharge during pregnancy.  No itching or odd odor from vagina.  OB History    Grav Para Term Preterm Abortions TAB SAB Ect Mult Living   4 2 2  0 1 1 0 0 0 2      Past Medical History  Diagnosis Date  . Asthma   . 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 Preeclampsia     with first pregnancy  . Bipolar II disorder   . Suicidal ideations   . Headache   . Pregnancy induced hypertension     seized 2wks after  . History of blood clots     x3  . Anemia   . Urinary tract infection   . Pyelonephritis   . Herpes   . Anxiety   . Seizures   . Eclamptic seizure 2003  . Migraines     Past Surgical History  Procedure Date  . Cesarean section 12/2001 and 01/2008  . Induced abortion 01/2010    Family History  Problem Relation Age of Onset  . Diabetes Mother     Type 2  . Anemia Sister   . Anesthesia  problems Neg Hx     History  Substance Use Topics  . Smoking status: Never Smoker   . Smokeless tobacco: Never Used  . Alcohol Use: No    Allergies: No Known Allergies  Prescriptions prior to admission  Medication Sig Dispense Refill  . acyclovir (ZOVIRAX) 400 MG tablet Take 400 mg by mouth 2 (two) times daily. 1 pill 3x a day for 5 days then 1 pill twice daily. Disp QS for 1 month. Then 60 pills daily with refills.      Marland Kitchen albuterol (VENTOLIN HFA) 108 (90 BASE) MCG/ACT inhaler Inhale 2 puffs into the lungs every 4 (four) hours as needed. For dyspnea.      . enoxaparin (LOVENOX) 80 MG/0.8ML injection Inject 70 mg into the skin 2 (two) times daily.      Burnis Medin Vit-FePoly-Methylfol-FA 29-1.13-0.4 MG TABS Take 1 tablet by mouth 1 day or 1 dose.      Marland Kitchen DISCONTD: enoxaparin (LOVENOX) 80 MG/0.8ML injection Inject 0.7 mLs (70 mg total) into the skin 2 (two) times daily.  30 mL  3  . DISCONTD: Prenat Vit-FePoly-Methylfol-FA 29-1.13-0.4 MG TABS Take 1 tablet by mouth  1 day or 1 dose.  30 tablet  5  . DISCONTD: acyclovir (ZOVIRAX) 400 MG tablet 1 pill 3x a day for 5 days then 1 pill twice daily. Disp QS for 1 month. Then 60 pills daily with refills.  1 tablet  4    Review of Systems  Constitutional: Negative for fever, chills and diaphoresis.  HENT: Positive for congestion (she has a cold.  Her daughter has a cold.). Negative for sore throat.   Respiratory: Negative for cough, shortness of breath and wheezing.   Cardiovascular: Positive for palpitations (On holter monitor.  Has had echo which was normal by report.). Negative for chest pain.  Gastrointestinal: Negative for nausea and vomiting. Abdominal pain: see HPI.  Skin: Negative for itching and rash.  Neurological: Negative for dizziness, tingling, speech change, focal weakness and headaches.  Psychiatric/Behavioral: The patient is nervous/anxious.    Physical Exam   Blood pressure 103/59, pulse 99, temperature 98.2 F (36.8 C),  temperature source Oral, resp. rate 16, height 5\' 3"  (1.6 m), weight 81.647 kg (180 lb), last menstrual period 09/26/2011.  Physical Exam  Constitutional: She is oriented to person, place, and time. She appears well-developed and well-nourished. No distress.  HENT:  Head: Normocephalic and atraumatic.  Eyes: Conjunctivae are normal. Right eye exhibits no discharge. Left eye exhibits no discharge. No scleral icterus.  Neck: Neck supple. No tracheal deviation present.  Cardiovascular: Normal rate, regular rhythm and normal heart sounds.   Respiratory: Effort normal and breath sounds normal. No stridor.  GI: Soft. She exhibits no distension. There is no tenderness. There is no rebound and no guarding.       Gravid uterus  Genitourinary: No labial fusion. There is no rash, tenderness, lesion or injury on the right labia. There is no rash, tenderness, lesion or injury on the left labia. Cervix exhibits no motion tenderness and no friability. No erythema, tenderness or bleeding around the vagina. No foreign body around the vagina. No signs of injury around the vagina. Vaginal discharge (copious white discharge) found.       Cervix is soft, posterior, long, fingertip externally, closed internally.  Neurological: She is alert and oriented to person, place, and time.  Skin: Skin is warm and dry. She is not diaphoretic.  Psychiatric: She has a normal mood and affect. Her behavior is normal. Judgment and thought content normal.    MAU Course  Procedures  Assessment and Plan  26 yo female [phone] presents at w d with 5 hours of contractions which were regular earlier every 5-6 minutes apart lasting about 1 minute, now stopped.  She feels good fetal activity, has not had any loss of fluid or bleeding.    #Contractions - No cervical change - Fetal fibronectin considered but not sent as her cervix is showing no change and her contractions have stopped. - While this is not preterm labor, patient was  instructed to return should she have more than five contractions for an hour.  She was asked to abstain from intercourse for 1 week.  #Discharge - GC/chlamydia sent and pending at time of discharge - Wet prep: many clue cells, consistent with bacterial vaginosis.  Will rx 7 day course of flagyl.  Clancy Gourd 05/02/2012, 8:59 PM

## 2012-05-02 NOTE — Progress Notes (Signed)
Pt is wearing a heart monitor for heart palpitations, states physician ordered her to wear it for a month

## 2012-05-03 ENCOUNTER — Telehealth: Payer: Self-pay | Admitting: *Deleted

## 2012-05-03 LAB — GC/CHLAMYDIA PROBE AMP, GENITAL: Chlamydia, DNA Probe: NEGATIVE

## 2012-05-03 NOTE — MAU Provider Note (Signed)
I was present for the exam and agree with above.  Dorathy Kinsman, CNM 05/03/2012 12:00 AM

## 2012-05-03 NOTE — Telephone Encounter (Signed)
Patient states that she was seen in MAU last night for contractions and false labor. She was feeling better when she was discharged last night and had planned to return to work this morning. She is not feeling well and still has contractions, that are not regular. She wanted to know if one of the doctors would write her a note to be excused from work today. I told her I would discuss this with a provider and let her know.

## 2012-05-03 NOTE — Telephone Encounter (Signed)
I spoke with Dr. Adrian Blackwater and he agreed to give her a note for today. Pt notified. She will pick up her letter later today.

## 2012-05-04 ENCOUNTER — Telehealth: Payer: Self-pay | Admitting: *Deleted

## 2012-05-04 NOTE — Telephone Encounter (Signed)
Spoke with patient via phone.  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  States there are days when she doesn't feel well and is not able to go to work.  States the company will not accept a doctor's note she must have her papers revised.  I explained to patient we do not write patients out of work because they don't feel well - this would be like having a bad cold or the flu and missing work.  The only time we write patients out of work is if there is a medical necessity requiring the absence.  I gave an example of being on bedrest for preterm labor.  Patient states understanding.  States she was seen in the Emergency Room here at [facility] on Wednesday 05/02/12 and was told not to return to work until today.  Requests we revise paperwork to reflect this absence.  I agreed to do that and fax to the company today.  In my investigation I see a doctor's note in the chart that was given to the patient stating she could return to work on 05/03/12.  Paperwork revised accordingly and faxed to company.  A copy was also mailed to the patient's home address.

## 2012-05-07 ENCOUNTER — Telehealth: Payer: Self-pay | Admitting: *Deleted

## 2012-05-07 ENCOUNTER — Encounter (HOSPITAL_COMMUNITY): Payer: Self-pay | Admitting: *Deleted

## 2012-05-07 ENCOUNTER — Inpatient Hospital Stay (HOSPITAL_COMMUNITY)
Admission: AD | Admit: 2012-05-07 | Discharge: 2012-05-07 | Disposition: A | Discharge status: Home / Self Care | Payer: Medicaid Other | Source: Ambulatory Visit | Attending: Obstetrics & Gynecology | Admitting: Obstetrics & Gynecology

## 2012-05-07 ENCOUNTER — Inpatient Hospital Stay (HOSPITAL_COMMUNITY): Payer: Medicaid Other

## 2012-05-07 DIAGNOSIS — R1031 Right lower quadrant pain: Secondary | ICD-10-CM

## 2012-05-07 DIAGNOSIS — O3410 Maternal care for benign tumor of corpus uteri, unspecified trimester: Secondary | ICD-10-CM | POA: Insufficient documentation

## 2012-05-07 DIAGNOSIS — O99891 Other specified diseases and conditions complicating pregnancy: Secondary | ICD-10-CM | POA: Insufficient documentation

## 2012-05-07 DIAGNOSIS — D259 Leiomyoma of uterus, unspecified: Secondary | ICD-10-CM | POA: Insufficient documentation

## 2012-05-07 DIAGNOSIS — Z331 Pregnant state, incidental: Secondary | ICD-10-CM

## 2012-05-07 HISTORY — DX: Benign neoplasm of connective and other soft tissue, unspecified: D21.9

## 2012-05-07 HISTORY — DX: Major depressive disorder, single episode, unspecified: F32.9

## 2012-05-07 HISTORY — DX: Herpesviral infection of urogenital system, unspecified: A60.00

## 2012-05-07 HISTORY — DX: Depression, unspecified: F32.A

## 2012-05-07 LAB — URINALYSIS, ROUTINE W REFLEX MICROSCOPIC
Glucose, UA: NEGATIVE mg/dL
Leukocytes, UA: NEGATIVE
Nitrite: NEGATIVE
Specific Gravity, Urine: 1.025 (ref 1.005–1.030)
pH: 6 (ref 5.0–8.0)

## 2012-05-07 LAB — CBC
HCT: 29.2 % — ABNORMAL LOW (ref 36.0–46.0)
Hemoglobin: 9.7 g/dL — ABNORMAL LOW (ref 12.0–15.0)
MCHC: 33.2 g/dL (ref 30.0–36.0)
WBC: 9.7 10*3/uL (ref 4.0–10.5)

## 2012-05-07 LAB — FETAL FIBRONECTIN: Fetal Fibronectin: NEGATIVE

## 2012-05-07 MED ORDER — HYDROMORPHONE HCL PF 1 MG/ML IJ SOLN
1.0000 mg | INTRAMUSCULAR | Status: AC
  Administered 2012-05-07: 1 mg via INTRAMUSCULAR
  Filled 2012-05-07: qty 1

## 2012-05-07 MED ORDER — HYDROMORPHONE HCL PF 1 MG/ML IJ SOLN: 1.0000 mg | INTRAMUSCULAR | Status: DC

## 2012-05-07 MED ORDER — HYDROCODONE-ACETAMINOPHEN 5-325 MG PO TABS: 2.0000 | ORAL_TABLET | ORAL | Status: DC

## 2012-05-07 MED ORDER — HYDROCODONE-ACETAMINOPHEN 5-325 MG PO TABS
2.0000 | ORAL_TABLET | ORAL | Status: AC
  Administered 2012-05-07: 2 via ORAL
  Filled 2012-05-07: qty 2

## 2012-05-07 MED ORDER — PROMETHAZINE HCL 25 MG PO TABS
25.0000 mg | ORAL_TABLET | Freq: Four times a day (QID) | ORAL | Status: DC | PRN
  Administered 2012-05-07: 25 mg via ORAL
  Filled 2012-05-07: qty 1

## 2012-05-07 MED ORDER — HYDROCODONE-ACETAMINOPHEN 5-325 MG PO TABS: 1.0000 | ORAL_TABLET | ORAL | Status: AC | PRN

## 2012-05-07 NOTE — Telephone Encounter (Signed)
Pt left message that she was seen @ [facility]U on 5/29 and was given Rx for BV. Since then she has been having upset stomach, abdominal pain and diarrhea. Since last night she has also had a new pain in her back and abdomen which comes every 5 min. This pain causes her to hold her breath. She would like advice as to whether she should return to [facility]U or be seen @ the clinic. I returned her call and instructed her to go to MAU because she is going to require a more complete evaluation than can be done in the clinic. Her sx may be caused by the medication she was given but I am also concerned that she may be having UC's/pre-term labor.  Pt states that the pain she has is sharp- almost like with a bladder infection. I explained that they will evaluate all of her sx @ MAU. Pt agreed and voiced understanding.

## 2012-05-07 NOTE — MAU Provider Note (Signed)
Chief Complaint:  No chief complaint on file.   First Provider Initiated Contact with Patient 05/07/12 1504      HPI  Paula Benson is a 26 y.o. [id number] pt of [facility] at [redacted]w[redacted]d presenting with RLQ pain and abdominal cramping since last night.  She reports she can feel the cramping every 5 minutes but the intermittent RLQ pain is the most severe pain and is why she came in.  She is grimacing and breathing with pain every 4-5 minutes and holding her right side.  She reports good fetal movement, denies LOF, vaginal bleeding, vaginal itching/burning, urinary symptoms, h/a, dizziness, n/v, or fever/chills.  She has not had intercourse or anything in the vagina for >48 hours.   Pregnancy Course: uncomplicated  Past Medical History: Past Medical History  Diagnosis Date  . Asthma   . 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 Preeclampsia     with first pregnancy  . Bipolar II disorder   . Suicidal ideations   . Headache   . Pregnancy induced hypertension     seized 2wks after  . History of blood clots     x3  . Anemia   . Urinary tract infection   . Pyelonephritis   . Herpes   . Anxiety   . Eclamptic seizure 2003  . Migraines   . Seizures     after first delivery  . Depression     doing ok  . Fibroid   . Genital herpes     Past Surgical History: Past Surgical History  Procedure Date  . Cesarean section 12/2001 and 01/2008  . Induced abortion 01/2010    Family History: Family History  Problem Relation Age of Onset  . Diabetes Mother     Type 2  . Anemia Sister   . Hypertension Sister   . Anesthesia problems Neg Hx     Social History: History  Substance Use Topics  . Smoking status: Never Smoker   . Smokeless tobacco: Never Used  . Alcohol Use: No    Allergies: No Known Allergies  Meds:  Prescriptions prior to admission  Medication Sig Dispense Refill  . acyclovir (ZOVIRAX) 400 MG  tablet Take 400 mg by mouth 2 (two) times daily. 1 pill 3x a day for 5 days then 1 pill twice daily. Disp QS for 1 month. Then 60 pills daily with refills.      Marland Kitchen albuterol (VENTOLIN HFA) 108 (90 BASE) MCG/ACT inhaler Inhale 2 puffs into the lungs every 4 (four) hours as needed. For dyspnea.      . enoxaparin (LOVENOX) 80 MG/0.8ML injection Inject 70 mg into the skin 2 (two) times daily.      . metroNIDAZOLE (FLAGYL) 500 MG tablet Take one tablet by mouth twice daily for seven days  14 tablet  0  . Prenat Vit-FePoly-Methylfol-FA 29-1.13-0.4 MG TABS Take 1 tablet by mouth 1 day or 1 dose.          Physical Exam  Blood pressure 112/78, pulse 115, temperature 98.6 F (37 C), temperature source Oral, resp. rate 20, last menstrual period 09/26/2011, SpO2 96.00%. GENERAL: Well-developed, well-nourished female in no acute distress.  HEENT: normocephalic, good dentition HEART: normal rate RESP: normal effort ABDOMEN: Soft, nontender, nondistended, gravid.  EXTREMITIES: Nontender, no edema NEURO: alert and oriented  Cervix FT/long/high FFN collected prior to exam    FHT:  Baseline 135 , moderate  variability, accelerations present, no decelerations Contractions: irritability every 3 minutes   Labs: Results for orders placed during the hospital encounter of 05/07/12 (from the past 24 hour(s))  URINALYSIS, ROUTINE W REFLEX MICROSCOPIC     Status: Abnormal   Collection Time   05/07/12  2:30 PM      Component Value Range   Color, Urine YELLOW  YELLOW    APPearance CLEAR  CLEAR    Specific Gravity, Urine 1.025  1.005 - 1.030    pH 6.0  5.0 - 8.0    Glucose, UA NEGATIVE  NEGATIVE (mg/dL)   Hgb urine dipstick NEGATIVE  NEGATIVE    Bilirubin Urine NEGATIVE  NEGATIVE    Ketones, ur 15 (*) NEGATIVE (mg/dL)   Protein, ur NEGATIVE  NEGATIVE (mg/dL)   Urobilinogen, UA 0.2  0.0 - 1.0 (mg/dL)   Nitrite NEGATIVE  NEGATIVE    Leukocytes, UA NEGATIVE  NEGATIVE   FETAL FIBRONECTIN     Status: Normal    Collection Time   05/07/12  3:25 PM      Component Value Range   Fetal Fibronectin NEGATIVE  NEGATIVE   CBC     Status: Abnormal   Collection Time   05/07/12  3:37 PM      Component Value Range   WBC 9.7  4.0 - 10.5 (K/uL)   RBC 3.54 (*) 3.87 - 5.11 (MIL/uL)   Hemoglobin 9.7 (*) 12.0 - 15.0 (g/dL)   HCT 40.9 (*) 81.1 - 46.0 (%)   MCV 82.5  78.0 - 100.0 (fL)   MCH 27.4  26.0 - 34.0 (pg)   MCHC 33.2  30.0 - 36.0 (g/dL)   RDW 91.4 (*) 78.2 - 15.5 (%)   Platelets 294  150 - 400 (K/uL)     Imaging:    Assessment: Uterine fibroid on right side of uterus Reactive NST   Plan: Called Dr Macon Large to report assessment and findings D/C home with preterm labor precautions Lortab 5/325, 1-2 tabs Q 4 hours PRN F/U in [facility] Return to MAU as needed for increase in pain, n/v, fever/chills, decreased fetal movement, regular contractions   Paula Benson, Paula Benson 6/3/20133:28 PM

## 2012-05-07 NOTE — Discharge Instructions (Signed)
Fibroids You have been diagnosed as having a fibroid. Fibroids are smooth muscle lumps (tumors) which can occur any place in a woman's body. They are usually in the womb (uterus). The most common problem (symptom) of fibroids is bleeding. Over time this may cause low red blood cells (anemia). Other symptoms include feelings of pressure and pain in the pelvis. The diagnosis (learning what is wrong) of fibroids is made by physical exam. Sometimes tests such as an ultrasound are used. This is helpful when fibroids are felt around the ovaries and to look for tumors. TREATMENT   Most fibroids do not need surgical or medical treatment. Sometimes a tissue sample (biopsy) of the lining of the uterus is done to rule out cancer. If there is no cancer and only a small amount of bleeding, the problem can be watched.   Hormonal treatment can improve the problem.   When surgery is needed, it can consist of removing the fibroid. Vaginal birth may not be possible after the removal of fibroids. This depends on where they are and the extent of surgery. When pregnancy occurs with fibroids it is usually normal.   Your caregiver can help decide which treatments are best for you.  HOME CARE INSTRUCTIONS   Do not use aspirin as this may increase bleeding problems.   If your periods (menses) are heavy, record the number of pads or tampons used per month. Bring this information to your caregiver. This can help them determine the best treatment for you.  SEEK IMMEDIATE MEDICAL CARE IF:  You have pelvic pain or cramps not controlled with medications, or experience a sudden increase in pain.   You have an increase of pelvic bleeding between and during menses.   You feel lightheaded or have fainting spells.   You develop worsening belly (abdominal) pain.  Document Released: 11/18/2000 Document Revised: 11/10/2011 Document Reviewed: 07/10/2008 ExitCare Patient Information 2012 [employer].    

## 2012-05-07 NOTE — MAU Note (Addendum)
Has felt more like cramps in the past.  Pains started last night, more uincomfortable, closer and stronger.  Feels different, more intense. Sharp pains, like the pain of a urinary tract infection.

## 2012-05-09 NOTE — MAU Provider Note (Signed)
Attestation of Attending Supervision of Advanced Practitioner: Evaluation and management procedures were performed by the OB Fellow/PA/CNM/NP under my supervision and collaboration. Chart reviewed, and agree with management and plan.  Frankie Scipio, M.D. 05/09/2012 8:02 AM   

## 2012-05-10 ENCOUNTER — Telehealth: Payer: Self-pay | Admitting: *Deleted

## 2012-05-10 ENCOUNTER — Ambulatory Visit (INDEPENDENT_AMBULATORY_CARE_PROVIDER_SITE_OTHER): Payer: Medicaid Other | Admitting: Obstetrics & Gynecology

## 2012-05-10 VITALS — BP 115/81 | Temp 97.8°F | Wt 182.0 lb

## 2012-05-10 DIAGNOSIS — N949 Unspecified condition associated with female genital organs and menstrual cycle: Secondary | ICD-10-CM

## 2012-05-10 DIAGNOSIS — O9989 Other specified diseases and conditions complicating pregnancy, childbirth and the puerperium: Secondary | ICD-10-CM

## 2012-05-10 DIAGNOSIS — Z98891 History of uterine scar from previous surgery: Secondary | ICD-10-CM

## 2012-05-10 DIAGNOSIS — O34219 Maternal care for unspecified type scar from previous cesarean delivery: Secondary | ICD-10-CM

## 2012-05-10 DIAGNOSIS — O26899 Other specified pregnancy related conditions, unspecified trimester: Secondary | ICD-10-CM | POA: Insufficient documentation

## 2012-05-10 DIAGNOSIS — O0990 Supervision of high risk pregnancy, unspecified, unspecified trimester: Secondary | ICD-10-CM

## 2012-05-10 MED ORDER — CYCLOBENZAPRINE HCL 10 MG PO TABS: 10.0000 mg | ORAL_TABLET | Freq: Three times a day (TID) | ORAL | Status: AC | PRN

## 2012-05-10 NOTE — Telephone Encounter (Signed)
Patient was seen in the office today.  Dr. Macon Large was provider and gave instructions to send out of work paperwork to employer.  Patient was seen in MAU on 05/07/12 and is to remain out of work through 05/14/12.  May return to work on 05/15/12.  Paperwork completed.  Copy faxed to [employer].  Copy of fax confirmation and paperwork mailed to patient's home address.

## 2012-05-10 NOTE — Patient Instructions (Signed)
Return to clinic for any obstetric concerns or go to MAU for evaluation  

## 2012-05-10 NOTE — Progress Notes (Signed)
Patient has a lot of pain and requesting several days off work due to pain.  Pain attributed to increase in size of 3 cm fibroid on her right side, possible RLP, also possible musculoskeletal component.  She says Percocet is not working, suggested Flexeril and patient agrees.  Flexeril e-prescribed.  Wants paperwork excusing her from work for days that she was seen in the MAU and clinic, also for rest of this week because of debilitating pain.  Paula Benson, our clinic manager was made aware of this request. No other complaints or concerns.  Fetal movement and labor precautions reviewed.

## 2012-05-10 NOTE — Progress Notes (Signed)
Pulse:96 Has pain in her right lower quadrant, went to MAU Monday.

## 2012-05-15 ENCOUNTER — Telehealth: Payer: Self-pay

## 2012-05-15 NOTE — Telephone Encounter (Signed)
Pt called and informed me that she was seen in MAU on 05/10/12 in which she was prescribed flexeril and norco for pain due increase in size of fibroid.  Per Dr. Macon Large pt can have out of work note just for today returning back to work tomorrow and to stop taking flexeril and/or norco due to pts c/o tiredness and "feeling loopy".  Pt can take tylenol for the pain per Dr. Macon Large.  I advised pt not to take more than 4,000mg  of Tylenol in a 24 hour period.  Then at her prenatal appt on 05/24/12 she can discuss if the tylenol is working.  Pt stated understanding and had no further questions.

## 2012-05-16 ENCOUNTER — Telehealth: Payer: Self-pay | Admitting: *Deleted

## 2012-05-16 NOTE — Telephone Encounter (Signed)
Pt aware of event monitor results. She is 8 1/2 months pregnant and doing well according to pt. Mylo Red RN

## 2012-05-24 ENCOUNTER — Encounter: Payer: Self-pay | Admitting: Family Medicine

## 2012-05-24 ENCOUNTER — Ambulatory Visit (INDEPENDENT_AMBULATORY_CARE_PROVIDER_SITE_OTHER): Payer: Medicaid Other | Admitting: Family Medicine

## 2012-05-24 VITALS — BP 133/83 | Temp 97.8°F | Wt 186.5 lb

## 2012-05-24 DIAGNOSIS — Z98891 History of uterine scar from previous surgery: Secondary | ICD-10-CM

## 2012-05-24 DIAGNOSIS — O9989 Other specified diseases and conditions complicating pregnancy, childbirth and the puerperium: Secondary | ICD-10-CM

## 2012-05-24 DIAGNOSIS — O26899 Other specified pregnancy related conditions, unspecified trimester: Secondary | ICD-10-CM

## 2012-05-24 DIAGNOSIS — N898 Other specified noninflammatory disorders of vagina: Secondary | ICD-10-CM

## 2012-05-24 DIAGNOSIS — O34219 Maternal care for unspecified type scar from previous cesarean delivery: Secondary | ICD-10-CM

## 2012-05-24 NOTE — Patient Instructions (Signed)
Breastfeeding BENEFITS OF BREASTFEEDING For the baby  The first milk (colostrum) helps the baby's digestive system function better.   There are antibodies from the mother in the milk that help the baby fight off infections.   The baby has a lower incidence of asthma, allergies, and SIDS (sudden infant death syndrome).   The nutrients in breast milk are better than formulas for the baby and helps the baby's brain grow better.   Babies who breastfeed have less gas, colic, and constipation.  For the mother  Breastfeeding helps develop a very special bond between mother and baby.   It is more convenient, always available at the correct temperature and cheaper than formula feeding.   It burns calories in the mother and helps with losing weight that was gained during pregnancy.   It makes the uterus contract back down to normal size faster and slows bleeding following delivery.   Breastfeeding mothers have a lower risk of developing breast cancer.  NURSE FREQUENTLY  A healthy, full-term baby may breastfeed as often as every hour or space his or her feedings to every 3 hours.   How often to nurse will vary from baby to baby. Watch your baby for signs of hunger, not the clock.   Nurse as often as the baby requests, or when you feel the need to reduce the fullness of your breasts.   Awaken the baby if it has been 3 to 4 hours since the last feeding.   Frequent feeding will help the mother make more milk and will prevent problems like sore nipples and engorgement of the breasts.  BABY'S POSITION AT THE BREAST  Whether lying down or sitting, be sure that the baby's tummy is facing your tummy.   Support the breast with 4 fingers underneath the breast and the thumb above. Make sure your fingers are well away from the nipple and baby's mouth.   Stroke the baby's lips and cheek closest to the breast gently with your finger or nipple.   When the baby's mouth is open wide enough, place all  of your nipple and as much of the dark area around the nipple as possible into your baby's mouth.   Pull the baby in close so the tip of the nose and the baby's cheeks touch the breast during the feeding.  FEEDINGS  The length of each feeding varies from baby to baby and from feeding to feeding.   The baby must suck about 2 to 3 minutes for your milk to get to him or her. This is called a "let down." For this reason, allow the baby to feed on each breast as long as he or she wants. Your baby will end the feeding when he or she has received the right balance of nutrients.   To break the suction, put your finger into the corner of the baby's mouth and slide it between his or her gums before removing your breast from his or her mouth. This will help prevent sore nipples.  REDUCING BREAST ENGORGEMENT  In the first week after your baby is born, you may experience signs of breast engorgement. When breasts are engorged, they feel heavy, warm, full, and may be tender to the touch. You can reduce engorgement if you:   Nurse frequently, every 2 to 3 hours. Mothers who breastfeed early and often have fewer problems with engorgement.   Place light ice packs on your breasts between feedings. This reduces swelling. Wrap the ice packs in a   lightweight towel to protect your skin.   Apply moist hot packs to your breast for 5 to 10 minutes before each feeding. This increases circulation and helps the milk flow.   Gently massage your breast before and during the feeding.   Make sure that the baby empties at least one breast at every feeding before switching sides.   Use a breast pump to empty the breasts if your baby is sleepy or not nursing well. You may also want to pump if you are returning to work or or you feel you are getting engorged.   Avoid bottle feeds, pacifiers or supplemental feedings of water or juice in place of breastfeeding.   Be sure the baby is latched on and positioned properly while  breastfeeding.   Prevent fatigue, stress, and anemia.   Wear a supportive bra, avoiding underwire styles.   Eat a balanced diet with enough fluids.  If you follow these suggestions, your engorgement should improve in 24 to 48 hours. If you are still experiencing difficulty, call your lactation consultant or caregiver. IS MY BABY GETTING ENOUGH MILK? Sometimes, mothers worry about whether their babies are getting enough milk. You can be assured that your baby is getting enough milk if:  The baby is actively sucking and you hear swallowing.   The baby nurses at least 8 to 12 times in a 24 hour time period. Nurse your baby until he or she unlatches or falls asleep at the first breast (at least 10 to 20 minutes), then offer the second side.   The baby is wetting 5 to 6 disposable diapers (6 to 8 cloth diapers) in a 24 hour period by 5 to 6 days of age.   The baby is having at least 2 to 3 stools every 24 hours for the first few months. Breast milk is all the food your baby needs. It is not necessary for your baby to have water or formula. In fact, to help your breasts make more milk, it is best not to give your baby supplemental feedings during the early weeks.   The stool should be soft and yellow.   The baby should gain 4 to 7 ounces per week after he is 4 days old.  TAKE CARE OF YOURSELF Take care of your breasts by:  Bathing or showering daily.   Avoiding the use of soaps on your nipples.   Start feedings on your left breast at one feeding and on your right breast at the next feeding.   You will notice an increase in your milk supply 2 to 5 days after delivery. You may feel some discomfort from engorgement, which makes your breasts very firm and often tender. Engorgement "peaks" out within 24 to 48 hours. In the meantime, apply warm moist towels to your breasts for 5 to 10 minutes before feeding. Gentle massage and expression of some milk before feeding will soften your breasts, making  it easier for your baby to latch on. Wear a well fitting nursing bra and air dry your nipples for 10 to 15 minutes after each feeding.   Only use cotton bra pads.   Only use pure lanolin on your nipples after nursing. You do not need to wash it off before nursing.  Take care of yourself by:   Eating well-balanced meals and nutritious snacks.   Drinking milk, fruit juice, and water to satisfy your thirst (about 8 glasses a day).   Getting plenty of rest.   Increasing calcium in   your diet (1200 mg a day).   Avoiding foods that you notice affect the baby in a bad way.  SEEK MEDICAL CARE IF:   You have any questions or difficulty with breastfeeding.   You need help.   You have a hard, red, sore area on your breast, accompanied by a fever of 100.5 F (38.1 C) or more.   Your baby is too sleepy to eat well or is having trouble sleeping.   Your baby is wetting less than 6 diapers per day, by 5 days of age.   Your baby's skin or white part of his or her eyes is more yellow than it was in the hospital.   You feel depressed.  Document Released: 11/21/2005 Document Revised: 11/10/2011 Document Reviewed: 07/06/2009 ExitCare Patient Information 2012 [employer]. 

## 2012-05-24 NOTE — Progress Notes (Signed)
Having vaginal d/c will check wet prep. Taking lovenox. Pain with fibroid.

## 2012-05-24 NOTE — Progress Notes (Signed)
Pulse: 113 Edema trace in feet and sometimes legs. Pain and pressure in pelvic. Vaginal d/c as thin white with itch, no odor.

## 2012-05-25 ENCOUNTER — Other Ambulatory Visit: Payer: Self-pay | Admitting: Family Medicine

## 2012-05-25 DIAGNOSIS — B373 Candidiasis of vulva and vagina: Secondary | ICD-10-CM

## 2012-05-25 LAB — WET PREP, GENITAL: Trich, Wet Prep: NONE SEEN

## 2012-05-25 MED ORDER — FLUCONAZOLE 150 MG PO TABS: 150.0000 mg | ORAL_TABLET | Freq: Once | ORAL | Status: AC

## 2012-05-29 NOTE — Progress Notes (Signed)
Attempted to call patient, no answer, left message to return call.

## 2012-05-30 NOTE — Progress Notes (Signed)
Patient notified of wet prep result and Rx at her pharmacy- note from Dr. Shawnie Pons. Patient satisfied.

## 2012-05-31 ENCOUNTER — Ambulatory Visit (INDEPENDENT_AMBULATORY_CARE_PROVIDER_SITE_OTHER): Payer: Medicaid Other | Admitting: Family

## 2012-05-31 VITALS — BP 116/80 | Temp 96.4°F | Wt 192.4 lb

## 2012-05-31 DIAGNOSIS — O0990 Supervision of high risk pregnancy, unspecified, unspecified trimester: Secondary | ICD-10-CM

## 2012-05-31 DIAGNOSIS — O34219 Maternal care for unspecified type scar from previous cesarean delivery: Secondary | ICD-10-CM

## 2012-05-31 LAB — POCT URINALYSIS DIP (DEVICE)
Ketones, ur: NEGATIVE mg/dL
Protein, ur: NEGATIVE mg/dL
Specific Gravity, Urine: >=1.03 (ref 1.005–1.030)
Urobilinogen, UA: 0.2 mg/dL (ref 0.0–1.0)
pH: 7 (ref 5.0–8.0)

## 2012-05-31 NOTE — Progress Notes (Signed)
Reports left groin pain; intermittent contractions 3-4 day; no bleeding or leaking of fluid;  +fetal movement; heparin level today; schedule growth [place].

## 2012-05-31 NOTE — Progress Notes (Signed)
U/S scheduled June 06, 2012 at 8am.

## 2012-05-31 NOTE — Progress Notes (Signed)
Pulse- 82  Edema- legs, feet;  Pain- left side pain; Pressure- lower abd Pt c/o with left sided pain has contractions.  Also pt stated that she almost feel out of the shower but caught herself "one leg in and one leg out"

## 2012-06-01 LAB — HEPARIN LEVEL (UNFRACTIONATED): Heparin Unfractionated: 0.3 IU/mL (ref 0.3–0.7)

## 2012-06-06 ENCOUNTER — Ambulatory Visit (HOSPITAL_COMMUNITY)
Admission: RE | Admit: 2012-06-06 | Discharge: 2012-06-06 | Disposition: A | Discharge status: Home / Self Care | Payer: Medicaid Other | Source: Ambulatory Visit | Attending: Family | Admitting: Family

## 2012-06-06 DIAGNOSIS — O34219 Maternal care for unspecified type scar from previous cesarean delivery: Secondary | ICD-10-CM | POA: Insufficient documentation

## 2012-06-06 DIAGNOSIS — O09299 Supervision of pregnancy with other poor reproductive or obstetric history, unspecified trimester: Secondary | ICD-10-CM | POA: Insufficient documentation

## 2012-06-06 DIAGNOSIS — O0990 Supervision of high risk pregnancy, unspecified, unspecified trimester: Secondary | ICD-10-CM

## 2012-06-06 DIAGNOSIS — O0930 Supervision of pregnancy with insufficient antenatal care, unspecified trimester: Secondary | ICD-10-CM | POA: Insufficient documentation

## 2012-06-06 DIAGNOSIS — O3410 Maternal care for benign tumor of corpus uteri, unspecified trimester: Secondary | ICD-10-CM | POA: Insufficient documentation

## 2012-06-11 ENCOUNTER — Ambulatory Visit (INDEPENDENT_AMBULATORY_CARE_PROVIDER_SITE_OTHER): Payer: Medicaid Other | Admitting: Obstetrics & Gynecology

## 2012-06-11 VITALS — BP 119/84 | Temp 97.0°F | Wt 190.4 lb

## 2012-06-11 DIAGNOSIS — O34219 Maternal care for unspecified type scar from previous cesarean delivery: Secondary | ICD-10-CM

## 2012-06-11 DIAGNOSIS — Z86711 Personal history of pulmonary embolism: Secondary | ICD-10-CM

## 2012-06-11 NOTE — Patient Instructions (Signed)
Sterilization, Women Sterilization is a surgical procedure. This surgery permanently prevents pregnancy in women. This can be done by tying (with or without cutting) the fallopian tubes or burning the tubes closed (tubal ligation). Tubal ligation blocks the tubes and prevents the egg from being fertilized by the sperm. Sterilization can be done by removing the ovaries that produce the egg (castration) as well. Sterilization is considered safe with very rare complications. It does not affect menstrual periods, sexual desire, or performance.  Since sterilization is considered permanent, you should not do it until you are sure you do not want to have more children. You and your partner should fully agree to have the procedure. Your decision to have the procedure should not be made when you are in a stressful situation. This can include a loss of a pregnancy, illness or death of a spouse, or divorce. There are other means of preventing unwanted pregnancies that can be used until you are completely sure you want to be sterilized. Sterilization does not protect against sexually transmitted disease. Women who had a sterilization procedure and want it reversed must know that it requires an expensive and major operation. The reversal may not be successful and has a high rate of tubal (ectopic) pregnancy that can be dangerous and require surgery. There are several ways to perform a tubal sterlization:  Laparoscopy. The abdomen is filled with a gas to see the pelvic organs. Then, a tube with a light attached is inserted into the abdomen through 2 small incisions. The fallopian tubes are blocked with a ring, clip or electrocautery to burn closed the tubes. Then, the gas is released and the small incisions are closed.   Hysteroscopy. A tube with a light is inserted in the vagina, through the cervix and then into the uterus. A spring-like instrument is inserted into the opening of the fallopian tubes. The spring causes  scaring and blocks the tubes. Other forms of contraception should be used for three months at which time an X-ray is done to be sure the tubes are blocked.   Minilaparotomy. This is done right after giving birth. A small incision is made under the belly button and the tubes are exposed. The tubes can then be burned, tied and/or cut.   Tubal ligation can be done during a Cesarean section.   Castration is a surgical procedure that removes both ovaries.  Tubal sterilization should be discussed with your caregiver to answer any concerns you or your partner might have. This meeting will help to decide for sure if the operation is safe for you and which procedure is the best one for you. You can change your mind and cancel the surgery at any time. HOME CARE INSTRUCTIONS   Follow your caregivers instructions regarding diet, rest, work, social and sexual activities and follow up appointments.   Shoulder pain is common following a laparoscopy. The pain may be relieved by lying down flat.   Only take over-the-counter or prescription medicines for pain, discomfort or fever as directed by your caregiver.   You may use lozenges for throat discomfort.   Keep the incisions covered to prevent infection.  SEEK IMMEDIATE MEDICAL CARE IF:   You develop a temperature of 102 F (38.9 C), or as your caregiver suggests.   You become dizzy or faint.   You start to feel sick to your stomach (nausea) or throw up (vomit).   You develop abdominal pain not relieved with over-the-counter medications.   You have redness and puffiness (  swelling) of the cut (incision).   You see pus draining from the incision.   You miss a menstrual period.  Document Released: 05/09/2008 Document Revised: 11/10/2011 Document Reviewed: 05/09/2008 [facility] Patient Information 2012 [place], [place].Breastfeeding BENEFITS OF BREASTFEEDING For the baby  The first milk (colostrum) helps the baby's digestive system function better.     There are antibodies from the mother in the milk that help the baby fight off infections.   The baby has a lower incidence of asthma, allergies, and SIDS (sudden infant death syndrome).   The nutrients in breast milk are better than formulas for the baby and helps the baby's brain grow better.   Babies who breastfeed have less gas, colic, and constipation.  For the mother  Breastfeeding helps develop a very special bond between mother and baby.   It is more convenient, always available at the correct temperature and cheaper than formula feeding.   It burns calories in the mother and helps with losing weight that was gained during pregnancy.   It makes the uterus contract back down to normal size faster and slows bleeding following delivery.   Breastfeeding mothers have a lower risk of developing breast cancer.  NURSE FREQUENTLY  A healthy, full-term baby may breastfeed as often as every hour or space his or her feedings to every 3 hours.   How often to nurse will vary from baby to baby. Watch your baby for signs of hunger, not the clock.   Nurse as often as the baby requests, or when you feel the need to reduce the fullness of your breasts.   Awaken the baby if it has been 3 to 4 hours since the last feeding.   Frequent feeding will help the mother make more milk and will prevent problems like sore nipples and engorgement of the breasts.  BABY'S POSITION AT THE BREAST  Whether lying down or sitting, be sure that the baby's tummy is facing your tummy.   Support the breast with 4 fingers underneath the breast and the thumb above. Make sure your fingers are well away from the nipple and baby's mouth.   Stroke the baby's lips and cheek closest to the breast gently with your finger or nipple.   When the baby's mouth is open wide enough, place all of your nipple and as much of the dark area around the nipple as possible into your baby's mouth.   Pull the baby in close so the tip  of the nose and the baby's cheeks touch the breast during the feeding.  FEEDINGS  The length of each feeding varies from baby to baby and from feeding to feeding.   The baby must suck about 2 to 3 minutes for your milk to get to him or her. This is called a "let down." For this reason, allow the baby to feed on each breast as long as he or she wants. Your baby will end the feeding when he or she has received the right balance of nutrients.   To break the suction, put your finger into the corner of the baby's mouth and slide it between his or her gums before removing your breast from his or her mouth. This will help prevent sore nipples.  REDUCING BREAST ENGORGEMENT  In the first week after your baby is born, you may experience signs of breast engorgement. When breasts are engorged, they feel heavy, warm, full, and may be tender to the touch. You can reduce engorgement if you:  Nurse frequently, every 2 to 3 hours. Mothers who breastfeed early and often have fewer problems with engorgement.   Place light ice packs on your breasts between feedings. This reduces swelling. Wrap the ice packs in a lightweight towel to protect your skin.   Apply moist hot packs to your breast for 5 to 10 minutes before each feeding. This increases circulation and helps the milk flow.   Gently massage your breast before and during the feeding.   Make sure that the baby empties at least one breast at every feeding before switching sides.   Use a breast pump to empty the breasts if your baby is sleepy or not nursing well. You may also want to pump if you are returning to work or or you feel you are getting engorged.   Avoid bottle feeds, pacifiers or supplemental feedings of water or juice in place of breastfeeding.   Be sure the baby is latched on and positioned properly while breastfeeding.   Prevent fatigue, stress, and anemia.   Wear a supportive bra, avoiding underwire styles.   Eat a balanced diet with  enough fluids.  If you follow these suggestions, your engorgement should improve in 24 to 48 hours. If you are still experiencing difficulty, call your lactation consultant or caregiver. IS MY BABY GETTING ENOUGH MILK? Sometimes, mothers worry about whether their babies are getting enough milk. You can be assured that your baby is getting enough milk if:  The baby is actively sucking and you hear swallowing.   The baby nurses at least 8 to 12 times in a 24 hour time period. Nurse your baby until he or she unlatches or falls asleep at the first breast (at least 10 to 20 minutes), then offer the second side.   The baby is wetting 5 to 6 disposable diapers (6 to 8 cloth diapers) in a 24 hour period by 5 to 6 days of age.   The baby is having at least 2 to 3 stools every 24 hours for the first few months. Breast milk is all the food your baby needs. It is not necessary for your baby to have water or formula. In fact, to help your breasts make more milk, it is best not to give your baby supplemental feedings during the early weeks.   The stool should be soft and yellow.   The baby should gain 4 to 7 ounces per week after he is 4 days old.  TAKE CARE OF YOURSELF Take care of your breasts by:  Bathing or showering daily.   Avoiding the use of soaps on your nipples.   Start feedings on your left breast at one feeding and on your right breast at the next feeding.   You will notice an increase in your milk supply 2 to 5 days after delivery. You may feel some discomfort from engorgement, which makes your breasts very firm and often tender. Engorgement "peaks" out within 24 to 48 hours. In the meantime, apply warm moist towels to your breasts for 5 to 10 minutes before feeding. Gentle massage and expression of some milk before feeding will soften your breasts, making it easier for your baby to latch on. Wear a well fitting nursing bra and air dry your nipples for 10 to 15 minutes after each feeding.    Only use cotton bra pads.   Only use pure lanolin on your nipples after nursing. You do not need to wash it off before nursing.  Take care of   yourself by:   Eating well-balanced meals and nutritious snacks.   Drinking milk, fruit juice, and water to satisfy your thirst (about 8 glasses a day).   Getting plenty of rest.   Increasing calcium in your diet (1200 mg a day).   Avoiding foods that you notice affect the baby in a bad way.  SEEK MEDICAL CARE IF:   You have any questions or difficulty with breastfeeding.   You need help.   You have a hard, red, sore area on your breast, accompanied by a fever of 100.5 F (38.1 C) or more.   Your baby is too sleepy to eat well or is having trouble sleeping.   Your baby is wetting less than 6 diapers per day, by 38 days of age.   Your baby's skin or white part of his or her eyes is more yellow than it was in the hospital.   You feel depressed.  Document Released: 11/21/2005 Document Revised: 11/10/2011 Document Reviewed: 07/06/2009 [facility] Patient Information 2012 [place], [place].

## 2012-06-11 NOTE — Progress Notes (Signed)
Last growth 47%.  For rpt c/s and BTL.  Last heparin level was .3 but was drawn 11+ hours after the last injection.  GBS today.  Cervix closed. Instructions on Lovenox given for last 2 weeks.  Heparin level ordered for this week.

## 2012-06-11 NOTE — Progress Notes (Signed)
Pulse: 101 Has had swelling in her feet, not much now but over the past few days has had a lot of swelling.

## 2012-06-14 LAB — CULTURE, BETA STREP (GROUP B ONLY)

## 2012-06-18 ENCOUNTER — Encounter (HOSPITAL_COMMUNITY): Payer: Self-pay

## 2012-06-18 ENCOUNTER — Encounter (HOSPITAL_COMMUNITY): Payer: Self-pay | Admitting: Pharmacist

## 2012-06-19 ENCOUNTER — Encounter (HOSPITAL_COMMUNITY): Payer: Self-pay

## 2012-06-19 ENCOUNTER — Encounter (HOSPITAL_COMMUNITY)
Admission: RE | Admit: 2012-06-19 | Discharge: 2012-06-19 | Disposition: A | Discharge status: Home / Self Care | Payer: Medicaid Other | Source: Ambulatory Visit | Attending: Obstetrics & Gynecology | Admitting: Obstetrics & Gynecology

## 2012-06-19 VITALS — BP 118/80 | Ht 64.0 in | Wt 200.0 lb

## 2012-06-19 DIAGNOSIS — Z86711 Personal history of pulmonary embolism: Secondary | ICD-10-CM

## 2012-06-19 DIAGNOSIS — Z7901 Long term (current) use of anticoagulants: Secondary | ICD-10-CM

## 2012-06-19 HISTORY — DX: Personal history of other medical treatment: Z92.89

## 2012-06-19 LAB — TYPE AND SCREEN: Antibody Screen: NEGATIVE

## 2012-06-19 LAB — SURGICAL PCR SCREEN
MRSA, PCR: NEGATIVE
Staphylococcus aureus: NEGATIVE

## 2012-06-19 LAB — CBC
HCT: 31.7 % — ABNORMAL LOW (ref 36.0–46.0)
Hemoglobin: 10.2 g/dL — ABNORMAL LOW (ref 12.0–15.0)
MCH: 26.8 pg (ref 26.0–34.0)
MCHC: 32.2 g/dL (ref 30.0–36.0)
MCV: 83.4 fL (ref 78.0–100.0)
Platelets: 322 K/uL (ref 150–400)
RBC: 3.8 MIL/uL — ABNORMAL LOW (ref 3.87–5.11)
RDW: 15.9 % — ABNORMAL HIGH (ref 11.5–15.5)
WBC: 6 K/uL (ref 4.0–10.5)

## 2012-06-19 LAB — SYPHILIS: RPR W/REFLEX TO RPR TITER AND TREPONEMAL ANTIBODIES, TRADITIONAL SCREENING AND DIAGNOSIS ALGORITHM: RPR Ser Ql: NONREACTIVE

## 2012-06-19 NOTE — Pre-Procedure Instructions (Signed)
Email note sent to Drs. Rodman Pickle and Sheral Apley re high risk PPH.

## 2012-06-19 NOTE — Patient Instructions (Addendum)
YOUR PROCEDURE IS SCHEDULED ON:06/25/12  ENTER THROUGH THE MAIN ENTRANCE OF [facility] AT:1200PM  USE DESK PHONE AND DIAL [phone] TO INFORM us OF YOUR ARRIVAL  CALL [phone] IF YOU HAVE ANY QUESTIONS OR PROBLEMS PRIOR TO YOUR ARRIVAL.  REMEMBER: DO NOT EAT AFTER MIDNIGHT :SUNDAY     YOU MAY BRUSH YOUR TEETH THE MORNING OF SURGERY   TAKE THESE MEDICINES THE DAY OF SURGERY WITH SIP OF WATER:am meds    DO NOT WEAR JEWELRY, EYE MAKEUP, LIPSTICK OR DARK FINGERNAIL POLISH DO NOT WEAR LOTIONS  DO NOT SHAVE FOR 48 HOURS PRIOR TO SURGERY  YOU WILL NOT BE ALLOWED TO DRIVE YOURSELF HOME.

## 2012-06-21 ENCOUNTER — Ambulatory Visit (INDEPENDENT_AMBULATORY_CARE_PROVIDER_SITE_OTHER): Payer: Medicaid Other | Admitting: Family Medicine

## 2012-06-21 ENCOUNTER — Encounter: Payer: Self-pay | Admitting: Family Medicine

## 2012-06-21 VITALS — BP 125/85 | Temp 97.5°F | Wt 199.9 lb

## 2012-06-21 DIAGNOSIS — O34219 Maternal care for unspecified type scar from previous cesarean delivery: Secondary | ICD-10-CM

## 2012-06-21 DIAGNOSIS — Z86711 Personal history of pulmonary embolism: Secondary | ICD-10-CM

## 2012-06-21 DIAGNOSIS — O0990 Supervision of high risk pregnancy, unspecified, unspecified trimester: Secondary | ICD-10-CM

## 2012-06-21 DIAGNOSIS — Z98891 History of uterine scar from previous surgery: Secondary | ICD-10-CM

## 2012-06-21 NOTE — Patient Instructions (Signed)
Cesarean Delivery  °Cesarean delivery is the birth of a baby through a cut (incision) in the abdomen and womb (uterus).  °LET YOUR CAREGIVER KNOW ABOUT: °· Complications involving the pregnancy.  °· Allergies.  °· Medicines taken including herbs, eyedrops, over-the-counter medicines, and creams.  °· Use of steroids (by mouth or creams).  °· Previous problems with anesthetics or numbing medicine.  °· Previous surgery.  °· History of blood clots.  °· History of bleeding or blood problems.  °· Other health problems.  °RISKS AND COMPLICATIONS  °· Bleeding.  °· Infection.  °· Blood clots.  °· Injury to surrounding organs.  °· Anesthesia problems.  °· Injury to the baby.  °BEFORE THE PROCEDURE  °· A tube (Foley catheter) will be placed in your bladder. The Foley catheter drains the urine from your bladder into a bag. This keeps your bladder empty during surgery.  °· An intravenous access tube (IV) will be placed in your arm.  °· Hair may be removed from your pubic area and your lower abdomen. This is to prevent infection in the incision site.  °· You may be given an antacid medicine to drink. This will prevent acid contents in your stomach from going into your lungs if you vomit during the surgery.  °· You may be given an antibiotic medicine to prevent infection.  °PROCEDURE  °· You may be given medicine to numb the lower half of your body (regional anesthetic). If you were in labor, you may have already had an epidural in place which can be used in both labor and cesarean delivery. You may possibly be given medicine to make you sleep (general anesthetic) though this is not as common.  °· An incision will be made in your abdomen that extends to your uterus. There are 2 basic kinds of incisions:  °· The horizontal (transverse) incision. Horizontal incisions are used for most routine cesarean deliveries.  °· The vertical (up and down) incision. This is less commonly used. This is most often reserved for women who have a  serious complication (extreme prematurity) or under emergency situations.   °· The horizontal and vertical incisions may both be used at the same time. However, this is very uncommon.  °· Your baby will then be delivered.  °AFTER THE PROCEDURE  °· If you were awake during the surgery, you will see your baby right away. If you were asleep, you will see your baby as soon as you are awake.  °· You may breastfeed your baby after surgery.  °· You may be able to get up and walk the same day as the surgery. If you need to stay in bed for a period of time, you will receive help to turn, cough, and take deep breaths after surgery. This helps prevent lung problems such as pneumonia.  °· Do not get out of bed alone the first time after surgery. You will need help getting out of bed until you are able to do this by yourself.  °· You may be able to shower the day after your cesarean delivery.  After the bandage (dressing) is taken off the incision site, a nurse will assist you to shower, if you like.   °· You will have pneumatic compressing hose placed on your feet or lower legs. These hose are used to prevent blood clots. When you are up and walking regularly, they will no longer be necessary.   °· Do not cross your legs when you sit.  °· Save any blood clots that you   pass. If you pass a clot while on the toilet, do not flush it. Call for the nurse. Tell the nurse if you think you are bleeding too much or passing too many clots.  °· Start drinking liquids and eating food as directed by your caregiver. If your stomach is not ready, drinking and eating too soon can cause an increase in bloating and swelling of your intestine and abdomen. This is very uncomfortable.  °· You will be given medicine as needed. Let your caregivers know if you are hurting. They want you to be comfortable. You may also be given an antibiotic to prevent an infection.  °· Your IV will be taken out when you are drinking a reasonable amount of fluids. The  Foley catheter is taken out when you are up and walking.  °· If your blood type is Rh negative and your baby's blood type is Rh positive, you will be given a shot of anti-D immune globulin. This shot prevents you from having Rh problems with a future pregnancy. You should get the shot even if you had your tubes tied (tubal ligation).  °· If you are allowed to take the baby for a walk, place the baby in the bassinet and push it. Do not carry your baby in your arms.  °Document Released: 11/21/2005 Document Revised: 11/10/2011 Document Reviewed: 03/18/2011 °[employer]® Patient Information ©2012 [employer]. °

## 2012-06-21 NOTE — Progress Notes (Signed)
Some pressure and intermittent contractions.  Still on lovenox.  c-section scheduled for monday 

## 2012-06-21 NOTE — Progress Notes (Signed)
Pulse 96 Patient reports pelvic pressure and pain with occasional contractions.

## 2012-06-25 ENCOUNTER — Ambulatory Visit (HOSPITAL_COMMUNITY)
Admission: RE | Admit: 2012-06-25 | Discharge: 2012-06-25 | Disposition: A | Discharge status: Home / Self Care | Payer: Medicaid Other | Source: Ambulatory Visit | Attending: Obstetrics & Gynecology | Admitting: Obstetrics & Gynecology

## 2012-06-25 ENCOUNTER — Encounter (HOSPITAL_COMMUNITY)
Admission: RE | Disposition: A | Discharge status: Home / Self Care | Payer: Self-pay | Source: Ambulatory Visit | Attending: Obstetrics & Gynecology

## 2012-06-25 DIAGNOSIS — Z86711 Personal history of pulmonary embolism: Secondary | ICD-10-CM

## 2012-06-25 DIAGNOSIS — O34219 Maternal care for unspecified type scar from previous cesarean delivery: Secondary | ICD-10-CM | POA: Insufficient documentation

## 2012-06-25 DIAGNOSIS — Z5309 Procedure and treatment not carried out because of other contraindication: Secondary | ICD-10-CM | POA: Insufficient documentation

## 2012-06-25 DIAGNOSIS — Z7901 Long term (current) use of anticoagulants: Secondary | ICD-10-CM

## 2012-06-25 SURGERY — Surgical Case
Anesthesia: Regional | Site: Abdomen

## 2012-06-25 MED ORDER — MORPHINE SULFATE 0.5 MG/ML IJ SOLN
INTRAMUSCULAR | Status: AC
  Filled 2012-06-25: qty 10

## 2012-06-25 MED ORDER — PHENYLEPHRINE 40 MCG/ML (10ML) SYRINGE FOR IV PUSH (FOR BLOOD PRESSURE SUPPORT)
PREFILLED_SYRINGE | INTRAVENOUS | Status: AC
  Filled 2012-06-25: qty 5

## 2012-06-25 MED ORDER — SCOPOLAMINE 1 MG/3DAYS TD PT72
MEDICATED_PATCH | TRANSDERMAL | Status: AC
  Administered 2012-06-25: 1.5 mg via TRANSDERMAL
  Filled 2012-06-25: qty 1

## 2012-06-25 MED ORDER — OXYTOCIN 10 UNIT/ML IJ SOLN
INTRAMUSCULAR | Status: AC
  Filled 2012-06-25: qty 4

## 2012-06-25 MED ORDER — CEFAZOLIN SODIUM-DEXTROSE 2-3 GM-% IV SOLR
2.0000 g | Freq: Once | INTRAVENOUS | Status: DC
  Filled 2012-06-25: qty 50

## 2012-06-25 MED ORDER — CEFAZOLIN SODIUM-DEXTROSE 2-3 GM-% IV SOLR
INTRAVENOUS | Status: AC
  Filled 2012-06-25: qty 50

## 2012-06-25 MED ORDER — HEPARIN SOD (PORK) LOCK FLUSH 100 UNIT/ML IV SOLN
500.0000 [IU] | Freq: Once | INTRAVENOUS | Status: DC
  Filled 2012-06-25: qty 5

## 2012-06-25 MED ORDER — ONDANSETRON HCL 4 MG/2ML IJ SOLN
INTRAMUSCULAR | Status: AC
  Filled 2012-06-25: qty 2

## 2012-06-25 MED ORDER — SCOPOLAMINE 1 MG/3DAYS TD PT72
1.0000 | MEDICATED_PATCH | Freq: Once | TRANSDERMAL | Status: DC
  Administered 2012-06-25: 1.5 mg via TRANSDERMAL

## 2012-06-25 MED ORDER — FENTANYL CITRATE 0.05 MG/ML IJ SOLN
INTRAMUSCULAR | Status: AC
  Filled 2012-06-25: qty 2

## 2012-06-25 MED ORDER — EPHEDRINE 5 MG/ML INJ
INTRAVENOUS | Status: AC
  Filled 2012-06-25: qty 10

## 2012-06-25 MED ORDER — LACTATED RINGERS IV SOLN
INTRAVENOUS | Status: DC
  Administered 2012-06-25: 13:00:00 via INTRAVENOUS

## 2012-06-25 MED ORDER — HEPARIN (PORCINE) LOCK FLUSH 10 UNIT/ML IV SOLN: 10.0000 [IU] | Freq: Once | INTRAVENOUS | Status: DC

## 2012-06-25 SURGICAL SUPPLY — 28 items
CHLORAPREP W/TINT 26ML (MISCELLANEOUS) ×1 IMPLANT
CLOTH BEACON ORANGE TIMEOUT ST (SAFETY) ×1 IMPLANT
DRESSING TELFA 8X3 (GAUZE/BANDAGES/DRESSINGS) ×1 IMPLANT
ELECT REM PT RETURN 9FT ADLT (ELECTROSURGICAL)
ELECTRODE REM PT RTRN 9FT ADLT (ELECTROSURGICAL) ×1 IMPLANT
EXTRACTOR VACUUM M CUP 4 TUBE (SUCTIONS) IMPLANT
GAUZE SPONGE 4X4 12PLY STRL LF (GAUZE/BANDAGES/DRESSINGS) ×2 IMPLANT
GLOVE BIO SURGEON STRL SZ7 (GLOVE) IMPLANT
GLOVE BIOGEL PI IND STRL 7.0 (GLOVE) ×1 IMPLANT
GLOVE BIOGEL PI INDICATOR 7.0 (GLOVE) ×1
GOWN PREVENTION PLUS LG XLONG (DISPOSABLE) ×2 IMPLANT
GOWN PREVENTION PLUS XLARGE (GOWN DISPOSABLE) IMPLANT
KIT ABG SYR 3ML LUER SLIP (SYRINGE) IMPLANT
NEEDLE HYPO 22GX1.5 SAFETY (NEEDLE) ×1 IMPLANT
NEEDLE HYPO 25X5/8 SAFETYGLIDE (NEEDLE) IMPLANT
NS IRRIG 1000ML POUR BTL (IV SOLUTION) ×1 IMPLANT
PACK C SECTION WH (CUSTOM PROCEDURE TRAY) ×1 IMPLANT
PAD ABD 7.5X8 STRL (GAUZE/BANDAGES/DRESSINGS) IMPLANT
RTRCTR C-SECT PINK 25CM LRG (MISCELLANEOUS) IMPLANT
SLEEVE SCD COMPRESS KNEE MED (MISCELLANEOUS) IMPLANT
STAPLER VISISTAT 35W (STAPLE) IMPLANT
SUT VIC AB 0 CTX 36 (SUTURE)
SUT VIC AB 0 CTX36XBRD ANBCTRL (SUTURE) ×3 IMPLANT
SUT VIC AB 4-0 KS 27 (SUTURE) IMPLANT
SYR 30ML LL (SYRINGE) ×1 IMPLANT
TOWEL OR 17X24 6PK STRL BLUE (TOWEL DISPOSABLE) IMPLANT
TRAY FOLEY CATH 14FR (SET/KITS/TRAYS/PACK) ×1 IMPLANT
WATER STERILE IRR 1000ML POUR (IV SOLUTION) IMPLANT

## 2012-06-25 NOTE — Progress Notes (Signed)
Per Cher Nakai, RN in [place], Dr  Brayton Caves approved for patient to go home with IV Heplock .  IV was flushed and taped well.  Patient reschedule for surgery tomorrow.

## 2012-06-25 NOTE — Progress Notes (Signed)
Pt seen.  Took Lovenox last night @8pm .  Surgery postponed until tomorrow.  Pt notified.  Explained the risks of surgery and spinal in close proximity to Lovenox.  Question answered.  Quillan Whitter L. Harraway-Smith, M.D., [facility]

## 2012-06-25 NOTE — Interval H&P Note (Signed)
History and Physical Interval Note:  06/25/2012 12:51 PM  Paula Benson  has presented today for surgery, with the diagnosis of Repeat section and desires sterilization  The various methods of treatment have been discussed with the patient and family. After consideration of risks, benefits and other options for treatment, the patient has consented to  Procedure(s) (LRB): CESAREAN SECTION WITH BILATERAL TUBAL LIGATION (N/A) as a surgical intervention .  The patient's history has been reviewed, patient examined, no change in status, stable for surgery.  I have reviewed the patient's chart and labs.  Questions were answered to the patient's satisfaction.    Pt seen and examined all questions answered to pts satisfaction.  Will proceed to repeat c/s     Willodean Rosenthal, MD, FACOG

## 2012-06-25 NOTE — Anesthesia Preprocedure Evaluation (Signed)
Anesthesia Evaluation  Patient identified by MRN, date of birth, ID band Patient awake    Reviewed: Allergy & Precautions, H&P , NPO status , Patient's Chart, lab work & pertinent test results, reviewed documented beta blocker date and time   History of Anesthesia Complications Negative for: history of anesthetic complications  Airway Mallampati: II TM Distance: >3 FB Neck ROM: full    Dental  (+) Teeth Intact   Pulmonary neg pulmonary ROS,  breath sounds clear to auscultation        Cardiovascular DVT (PE x3, on lovenox bid, last dose 8 pm last night) Rhythm:regular Rate:Normal     Neuro/Psych negative neurological ROS  negative psych ROS   GI/Hepatic negative GI ROS, Neg liver ROS,   Endo/Other  negative endocrine ROS  Renal/GU negative Renal ROS     Musculoskeletal   Abdominal   Peds  Hematology negative hematology ROS (+)   Anesthesia Other Findings   Reproductive/Obstetrics (+) Pregnancy                           Anesthesia Physical Anesthesia Plan  ASA: II  Anesthesia Plan: Epidural   Post-op Pain Management:    Induction:   Airway Management Planned:   Additional Equipment:   Intra-op Plan:   Post-operative Plan:   Informed Consent: I have reviewed the patients History and Physical, chart, labs and discussed the procedure including the risks, benefits and alternatives for the proposed anesthesia with the patient or authorized representative who has indicated his/her understanding and acceptance.     Plan Discussed with:   Anesthesia Plan Comments:         Anesthesia Quick Evaluation

## 2012-06-25 NOTE — H&P (View-Only) (Signed)
Some pressure and intermittent contractions.  Still on lovenox.  c-section scheduled for monday

## 2012-06-26 ENCOUNTER — Inpatient Hospital Stay (HOSPITAL_COMMUNITY)
Admission: RE | Admit: 2012-06-26 | Discharge: 2012-06-29 | DRG: 766 | Disposition: A | Discharge status: Home / Self Care | Payer: Medicaid Other | Source: Ambulatory Visit | Attending: Obstetrics & Gynecology | Admitting: Obstetrics & Gynecology

## 2012-06-26 ENCOUNTER — Encounter (HOSPITAL_COMMUNITY): Payer: Self-pay | Admitting: General Surgery

## 2012-06-26 ENCOUNTER — Ambulatory Visit (HOSPITAL_COMMUNITY): Payer: Medicaid Other | Admitting: Anesthesiology

## 2012-06-26 ENCOUNTER — Encounter (HOSPITAL_COMMUNITY)
Admission: RE | Disposition: A | Discharge status: Home / Self Care | Payer: Self-pay | Source: Ambulatory Visit | Attending: Obstetrics & Gynecology

## 2012-06-26 ENCOUNTER — Encounter (HOSPITAL_COMMUNITY): Payer: Self-pay | Admitting: Anesthesiology

## 2012-06-26 DIAGNOSIS — D509 Iron deficiency anemia, unspecified: Secondary | ICD-10-CM

## 2012-06-26 DIAGNOSIS — O9902 Anemia complicating childbirth: Secondary | ICD-10-CM | POA: Diagnosis present

## 2012-06-26 DIAGNOSIS — Z98891 History of uterine scar from previous surgery: Secondary | ICD-10-CM

## 2012-06-26 DIAGNOSIS — O34219 Maternal care for unspecified type scar from previous cesarean delivery: Principal | ICD-10-CM | POA: Diagnosis present

## 2012-06-26 DIAGNOSIS — Z7901 Long term (current) use of anticoagulants: Secondary | ICD-10-CM

## 2012-06-26 DIAGNOSIS — Z01812 Encounter for preprocedural laboratory examination: Secondary | ICD-10-CM

## 2012-06-26 DIAGNOSIS — Z86711 Personal history of pulmonary embolism: Secondary | ICD-10-CM

## 2012-06-26 DIAGNOSIS — Z01818 Encounter for other preprocedural examination: Secondary | ICD-10-CM

## 2012-06-26 LAB — CREATININE, SERUM
GFR calc Af Amer: >90 mL/min (ref >90)
GFR calc non Af Amer: >90 mL/min (ref >90)

## 2012-06-26 SURGERY — Surgical Case
Anesthesia: Epidural | Site: Abdomen | Wound class: Clean Contaminated

## 2012-06-26 MED ORDER — OXYTOCIN 10 UNIT/ML IJ SOLN
INTRAMUSCULAR | Status: AC
  Filled 2012-06-26: qty 4

## 2012-06-26 MED ORDER — BUPIVACAINE IN DEXTROSE 0.75-8.25 % IT SOLN
INTRATHECAL | Status: DC | PRN
  Administered 2012-06-26: 1.6 mL via INTRATHECAL

## 2012-06-26 MED ORDER — FENTANYL CITRATE 0.05 MG/ML IJ SOLN
INTRAMUSCULAR | Status: AC
  Filled 2012-06-26: qty 2

## 2012-06-26 MED ORDER — DIPHENHYDRAMINE HCL 25 MG PO CAPS
25.0000 mg | ORAL_CAPSULE | Freq: Four times a day (QID) | ORAL | Status: DC | PRN
  Administered 2012-06-28: 25 mg via ORAL
  Filled 2012-06-26: qty 1

## 2012-06-26 MED ORDER — KETOROLAC TROMETHAMINE 30 MG/ML IJ SOLN: 15.0000 mg | Freq: Once | INTRAMUSCULAR | Status: DC | PRN

## 2012-06-26 MED ORDER — PHENYLEPHRINE 40 MCG/ML (10ML) SYRINGE FOR IV PUSH (FOR BLOOD PRESSURE SUPPORT)
PREFILLED_SYRINGE | INTRAVENOUS | Status: AC
  Filled 2012-06-26: qty 5

## 2012-06-26 MED ORDER — PROMETHAZINE HCL 25 MG/ML IJ SOLN: 6.2500 mg | INTRAMUSCULAR | Status: DC | PRN

## 2012-06-26 MED ORDER — SODIUM CHLORIDE 0.9 % IJ SOLN: 3.0000 mL | INTRAMUSCULAR | Status: DC | PRN

## 2012-06-26 MED ORDER — LANOLIN HYDROUS EX OINT: 1.0000 "application " | TOPICAL_OINTMENT | CUTANEOUS | Status: DC | PRN

## 2012-06-26 MED ORDER — DIPHENHYDRAMINE HCL 50 MG/ML IJ SOLN: 12.5000 mg | INTRAMUSCULAR | Status: DC | PRN

## 2012-06-26 MED ORDER — FENTANYL CITRATE 0.05 MG/ML IJ SOLN
INTRAMUSCULAR | Status: AC
  Administered 2012-06-26: 25 ug via INTRAVENOUS
  Filled 2012-06-26: qty 2

## 2012-06-26 MED ORDER — DIPHENHYDRAMINE HCL 50 MG/ML IJ SOLN
INTRAMUSCULAR | Status: DC | PRN
  Administered 2012-06-26: 25 mg via INTRAVENOUS

## 2012-06-26 MED ORDER — ENOXAPARIN SODIUM 40 MG/0.4ML ~~LOC~~ SOLN
70.0000 mg | Freq: Two times a day (BID) | SUBCUTANEOUS | Status: DC
  Filled 2012-06-26: qty 0.3

## 2012-06-26 MED ORDER — CEFAZOLIN SODIUM-DEXTROSE 2-3 GM-% IV SOLR
2.0000 g | Freq: Once | INTRAVENOUS | Status: DC
  Filled 2012-06-26: qty 50

## 2012-06-26 MED ORDER — MENTHOL 3 MG MT LOZG
1.0000 | LOZENGE | OROMUCOSAL | Status: DC | PRN
  Filled 2012-06-26: qty 9

## 2012-06-26 MED ORDER — MEPERIDINE HCL 25 MG/ML IJ SOLN: 6.2500 mg | INTRAMUSCULAR | Status: DC | PRN

## 2012-06-26 MED ORDER — MORPHINE SULFATE (PF) 0.5 MG/ML IJ SOLN
INTRAMUSCULAR | Status: DC | PRN
  Administered 2012-06-26: .1 mg via INTRATHECAL

## 2012-06-26 MED ORDER — PRENATAL MULTIVITAMIN CH
1.0000 | ORAL_TABLET | Freq: Every day | ORAL | Status: DC
  Administered 2012-06-27 – 2012-06-29 (×3): 1 via ORAL
  Filled 2012-06-26 (×3): qty 1

## 2012-06-26 MED ORDER — ONDANSETRON HCL 4 MG/2ML IJ SOLN
INTRAMUSCULAR | Status: AC
  Filled 2012-06-26: qty 2

## 2012-06-26 MED ORDER — ONDANSETRON HCL 4 MG PO TABS
4.0000 mg | ORAL_TABLET | ORAL | Status: DC | PRN
  Administered 2012-06-28 – 2012-06-29 (×2): 4 mg via ORAL
  Filled 2012-06-26 (×2): qty 1

## 2012-06-26 MED ORDER — NALBUPHINE HCL 10 MG/ML IJ SOLN
5.0000 mg | INTRAMUSCULAR | Status: DC | PRN
  Administered 2012-06-27 (×2): 10 mg via INTRAVENOUS
  Filled 2012-06-26 (×2): qty 1

## 2012-06-26 MED ORDER — DIPHENHYDRAMINE HCL 50 MG/ML IJ SOLN: 25.0000 mg | INTRAMUSCULAR | Status: DC | PRN

## 2012-06-26 MED ORDER — OXYTOCIN 10 UNIT/ML IJ SOLN
40.0000 [IU] | INTRAVENOUS | Status: DC | PRN
  Administered 2012-06-26: 40 [IU] via INTRAVENOUS

## 2012-06-26 MED ORDER — SIMETHICONE 80 MG PO CHEW
80.0000 mg | CHEWABLE_TABLET | Freq: Three times a day (TID) | ORAL | Status: DC
  Administered 2012-06-26 – 2012-06-29 (×9): 80 mg via ORAL

## 2012-06-26 MED ORDER — ACETAMINOPHEN 10 MG/ML IV SOLN
1000.0000 mg | Freq: Four times a day (QID) | INTRAVENOUS | Status: AC | PRN
  Filled 2012-06-26: qty 100

## 2012-06-26 MED ORDER — BUPIVACAINE HCL (PF) 0.25 % IJ SOLN
INTRAMUSCULAR | Status: DC | PRN
  Administered 2012-06-26: 30 mL

## 2012-06-26 MED ORDER — SCOPOLAMINE 1 MG/3DAYS TD PT72
1.0000 | MEDICATED_PATCH | Freq: Once | TRANSDERMAL | Status: DC
  Filled 2012-06-26: qty 1

## 2012-06-26 MED ORDER — OXYTOCIN 40 UNITS IN LACTATED RINGERS INFUSION - SIMPLE MED: 62.5000 mL/h | INTRAVENOUS | Status: AC

## 2012-06-26 MED ORDER — CEFAZOLIN SODIUM-DEXTROSE 2-3 GM-% IV SOLR
INTRAVENOUS | Status: AC
  Administered 2012-06-26: 2 g via INTRAVENOUS
  Filled 2012-06-26: qty 50

## 2012-06-26 MED ORDER — DIPHENHYDRAMINE HCL 25 MG PO CAPS
25.0000 mg | ORAL_CAPSULE | ORAL | Status: DC | PRN
  Administered 2012-06-26 – 2012-06-28 (×3): 25 mg via ORAL
  Filled 2012-06-26 (×3): qty 1

## 2012-06-26 MED ORDER — BUPIVACAINE HCL (PF) 0.25 % IJ SOLN
INTRAMUSCULAR | Status: AC
  Filled 2012-06-26: qty 30

## 2012-06-26 MED ORDER — FENTANYL CITRATE 0.05 MG/ML IJ SOLN
25.0000 ug | INTRAMUSCULAR | Status: DC | PRN
  Administered 2012-06-26: 25 ug via INTRAVENOUS
  Administered 2012-06-26: 50 ug via INTRAVENOUS

## 2012-06-26 MED ORDER — FENTANYL CITRATE 0.05 MG/ML IJ SOLN
INTRAMUSCULAR | Status: DC | PRN
  Administered 2012-06-26: 25 ug via INTRATHECAL

## 2012-06-26 MED ORDER — MORPHINE SULFATE 0.5 MG/ML IJ SOLN
INTRAMUSCULAR | Status: AC
  Filled 2012-06-26: qty 10

## 2012-06-26 MED ORDER — TETANUS-DIPHTH-ACELL PERTUSSIS 5-2.5-18.5 LF-MCG/0.5 IM SUSP
0.5000 mL | Freq: Once | INTRAMUSCULAR | Status: AC
  Administered 2012-06-27: 0.5 mL via INTRAMUSCULAR
  Filled 2012-06-26: qty 0.5

## 2012-06-26 MED ORDER — OXYCODONE-ACETAMINOPHEN 5-325 MG PO TABS
1.0000 | ORAL_TABLET | ORAL | Status: DC | PRN
Start: 2012-06-26 — End: 2012-06-29
  Administered 2012-06-27 (×4): 2 via ORAL
  Administered 2012-06-28: 1 via ORAL
  Administered 2012-06-28 (×2): 2 via ORAL
  Administered 2012-06-28 – 2012-06-29 (×2): 1 via ORAL
  Administered 2012-06-29: 2 via ORAL
  Filled 2012-06-26 (×3): qty 2
  Filled 2012-06-26 (×2): qty 1
  Filled 2012-06-26 (×4): qty 2
  Filled 2012-06-26: qty 1

## 2012-06-26 MED ORDER — IBUPROFEN 600 MG PO TABS
600.0000 mg | ORAL_TABLET | Freq: Four times a day (QID) | ORAL | Status: DC
  Administered 2012-06-26 – 2012-06-29 (×11): 600 mg via ORAL
  Filled 2012-06-26 (×11): qty 1

## 2012-06-26 MED ORDER — DIPHENHYDRAMINE HCL 50 MG/ML IJ SOLN
INTRAMUSCULAR | Status: AC
  Filled 2012-06-26: qty 1

## 2012-06-26 MED ORDER — ONDANSETRON HCL 4 MG/2ML IJ SOLN
INTRAMUSCULAR | Status: DC | PRN
  Administered 2012-06-26: 4 mg via INTRAVENOUS

## 2012-06-26 MED ORDER — ONDANSETRON HCL 4 MG/2ML IJ SOLN: 4.0000 mg | INTRAMUSCULAR | Status: DC | PRN

## 2012-06-26 MED ORDER — ZOLPIDEM TARTRATE 5 MG PO TABS: 5.0000 mg | ORAL_TABLET | Freq: Every evening | ORAL | Status: DC | PRN

## 2012-06-26 MED ORDER — WITCH HAZEL-GLYCERIN EX PADS: 1.0000 "application " | MEDICATED_PAD | CUTANEOUS | Status: DC | PRN

## 2012-06-26 MED ORDER — SENNOSIDES-DOCUSATE SODIUM 8.6-50 MG PO TABS
2.0000 | ORAL_TABLET | Freq: Every day | ORAL | Status: DC
  Administered 2012-06-26 – 2012-06-28 (×3): 2 via ORAL

## 2012-06-26 MED ORDER — DIBUCAINE 1 % RE OINT
1.0000 | TOPICAL_OINTMENT | RECTAL | Status: DC | PRN
Start: 2012-06-26 — End: 2012-06-29

## 2012-06-26 MED ORDER — ERYTHROMYCIN 5 MG/GM OP OINT
TOPICAL_OINTMENT | OPHTHALMIC | Status: AC
  Filled 2012-06-26: qty 1

## 2012-06-26 MED ORDER — LACTATED RINGERS IV SOLN
INTRAVENOUS | Status: DC
  Administered 2012-06-26 – 2012-06-27 (×2): via INTRAVENOUS

## 2012-06-26 MED ORDER — NALOXONE HCL 0.4 MG/ML IJ SOLN: 0.4000 mg | INTRAMUSCULAR | Status: DC | PRN

## 2012-06-26 MED ORDER — LACTATED RINGERS IV SOLN
INTRAVENOUS | Status: DC
  Administered 2012-06-26 (×3): via INTRAVENOUS

## 2012-06-26 MED ORDER — METOCLOPRAMIDE HCL 5 MG/ML IJ SOLN: 10.0000 mg | Freq: Three times a day (TID) | INTRAMUSCULAR | Status: DC | PRN

## 2012-06-26 MED ORDER — SIMETHICONE 80 MG PO CHEW: 80.0000 mg | CHEWABLE_TABLET | ORAL | Status: DC | PRN

## 2012-06-26 MED ORDER — SODIUM CHLORIDE 0.9 % IV SOLN
1.0000 ug/kg/h | INTRAVENOUS | Status: DC | PRN
  Filled 2012-06-26 (×2): qty 2.5

## 2012-06-26 MED ORDER — MIDAZOLAM HCL 2 MG/2ML IJ SOLN: 0.5000 mg | Freq: Once | INTRAMUSCULAR | Status: DC | PRN

## 2012-06-26 MED ORDER — ONDANSETRON HCL 4 MG/2ML IJ SOLN: 4.0000 mg | Freq: Three times a day (TID) | INTRAMUSCULAR | Status: DC | PRN

## 2012-06-26 MED ORDER — NALBUPHINE HCL 10 MG/ML IJ SOLN
5.0000 mg | INTRAMUSCULAR | Status: DC | PRN
  Filled 2012-06-26: qty 1

## 2012-06-26 MED ORDER — HEPARIN SODIUM (PORCINE) 5000 UNIT/ML IJ SOLN
5000.0000 [IU] | Freq: Once | INTRAMUSCULAR | Status: AC
  Administered 2012-06-26: 5000 [IU] via SUBCUTANEOUS
  Filled 2012-06-26: qty 1

## 2012-06-26 SURGICAL SUPPLY — 33 items
APL SKNCLS STERI-STRIP NONHPOA (GAUZE/BANDAGES/DRESSINGS) ×2
BARRIER ADHS 3X4 INTERCEED (GAUZE/BANDAGES/DRESSINGS) ×2 IMPLANT
BENZOIN TINCTURE PRP APPL 2/3 (GAUZE/BANDAGES/DRESSINGS) ×2 IMPLANT
BRR ADH 4X3 ABS CNTRL BYND (GAUZE/BANDAGES/DRESSINGS) ×2
CHLORAPREP W/TINT 26ML (MISCELLANEOUS) ×3 IMPLANT
CLOTH BEACON ORANGE TIMEOUT ST (SAFETY) ×3 IMPLANT
DRESSING TELFA 8X3 (GAUZE/BANDAGES/DRESSINGS) ×3 IMPLANT
DRSG COVADERM 4X10 (GAUZE/BANDAGES/DRESSINGS) ×2 IMPLANT
ELECT REM PT RETURN 9FT ADLT (ELECTROSURGICAL) ×3
ELECTRODE REM PT RTRN 9FT ADLT (ELECTROSURGICAL) ×2 IMPLANT
GAUZE SPONGE 4X4 12PLY STRL LF (GAUZE/BANDAGES/DRESSINGS) ×6 IMPLANT
GLOVE BIO SURGEON STRL SZ7 (GLOVE) ×3 IMPLANT
GLOVE BIOGEL PI IND STRL 7.0 (GLOVE) ×2 IMPLANT
GLOVE BIOGEL PI INDICATOR 7.0 (GLOVE) ×1
GOWN PREVENTION PLUS LG XLONG (DISPOSABLE) ×6 IMPLANT
GOWN PREVENTION PLUS XLARGE (GOWN DISPOSABLE) ×3 IMPLANT
NEEDLE HYPO 22GX1.5 SAFETY (NEEDLE) ×3 IMPLANT
NS IRRIG 1000ML POUR BTL (IV SOLUTION) ×3 IMPLANT
PACK C SECTION WH (CUSTOM PROCEDURE TRAY) ×3 IMPLANT
PAD ABD 7.5X8 STRL (GAUZE/BANDAGES/DRESSINGS) ×3 IMPLANT
RTRCTR C-SECT PINK 25CM LRG (MISCELLANEOUS) IMPLANT
SPONGE LAP 18X18 X RAY DECT (DISPOSABLE) ×2 IMPLANT
STAPLER VISISTAT 35W (STAPLE) IMPLANT
STRIP CLOSURE SKIN 1/2X4 (GAUZE/BANDAGES/DRESSINGS) ×2 IMPLANT
SUT PDS AB 0 CTX 60 (SUTURE) ×2 IMPLANT
SUT PDS AB 1 CT  36 (SUTURE) ×1
SUT PDS AB 1 CT 36 (SUTURE) ×2 IMPLANT
SUT VIC AB 0 CTX 36 (SUTURE) ×9
SUT VIC AB 0 CTX36XBRD ANBCTRL (SUTURE) ×6 IMPLANT
SUT VIC AB 4-0 KS 27 (SUTURE) ×2 IMPLANT
SYR 30ML LL (SYRINGE) ×3 IMPLANT
TOWEL OR 17X24 6PK STRL BLUE (TOWEL DISPOSABLE) ×6 IMPLANT
TRAY FOLEY CATH 14FR (SET/KITS/TRAYS/PACK) ×3 IMPLANT

## 2012-06-26 NOTE — Anesthesia Postprocedure Evaluation (Signed)
Anesthesia Post Note  Patient: Paula Benson  Procedure(s) Performed: Procedure(s) (LRB): CESAREAN SECTION (N/A)  Anesthesia type: Spinal  Patient location: PACU  Post pain: Pain level controlled  Post assessment: Post-op Vital signs reviewed  Last Vitals:  Filed Vitals:   06/26/12 1545  BP: 113/63  Pulse: 62  Temp:   Resp: 19    Post vital signs: Reviewed  Level of consciousness: awake  Complications: No apparent anesthesia complications

## 2012-06-26 NOTE — H&P (Signed)
Paula Benson is a 26 y.o. female [phone] with IUP at [redacted]w[redacted]d presenting for repeat cesarean section.The patient denies regular contractions, bleeding, leaking fluid, vaginal discharge, decreased fetal activity.   Prenatal Course Source of Care: [facility]  with onset of care at 21 weeks  Pregnancy complications or risks: Patient Active Problem List  Diagnosis  . ASTHMA, INTERMITTENT  . History of pulmonary embolus (PE)  . Chronic anticoagulation  . Panic disorder  . Genital herpes  . Recurrent urinary tract infection  . High-risk pregnancy  . Hx of preeclampsia, prior pregnancy, currently pregnant--had eclamptic seizure 2 weeks postpartum  . History of aspiration pneumonitis  . Iron deficiency anemia  . Palpitations  . Previous cesarean section  . Pregnancy related pelvic pain, antepartum   She desires to IUD.  She plans to plans to breastfeed  Prenatal labs and studies: ABO, Rh: --/--/O POS (07/22 1230) Antibody: NEG (07/22 1230) Rubella:  immune RPR: NON REACTIVE (07/16 0914)  HBsAg: NEGATIVE (01/07 1201)  HIV: NON REACTIVE (01/07 1201)  GBS:   negative 1 hr Glucola 118 Genetic screeningnormal Anatomy US normal  Past Medical History:  Past Medical History  Diagnosis Date  . Asthma   . 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 Bipolar II disorder   . Suicidal ideations   . History of blood clots     x3  . Anemia   . Urinary tract infection   . Herpes   . Anxiety   . Eclamptic seizure 2003  . Fibroid   . Genital herpes   . Preeclampsia 2003    with first pregnancy  . Pregnancy induced hypertension 2003    seized 2wks after  . Pyelonephritis 2012  . Seizures 2003    after first delivery  . Headache   . Migraines   . History of blood transfusion MARCH 2013  . Depression     doing ok  . Complication of anesthesia 2003    ASPIRATED DURING EMERGENCY CS    Past Surgical History:    Past Surgical History  Procedure Date  . Cesarean section 12/2001 and 01/2008  . Induced abortion 01/2010    Obstetrical History:  OB History    Grav Para Term Preterm Abortions TAB SAB Ect Mult Living   4 2 2  0 1 1 0 0 0 2      Social History:  History   Social History  . Marital Status: Single    Spouse Name: N/A    Number of Children: N/A  . Years of Education: N/A   Social History Main Topics  . Smoking status: Never Smoker   . Smokeless tobacco: Never Used  . Alcohol Use: No  . Drug Use: No  . Sexually Active: Yes    Birth Control/ Protection: Condom   Other Topics Concern  . Not on file   Social History Narrative   Student. Plans on staying here in [place] (originally living in [place]). 2 daughters.    Family History:  Family History  Problem Relation Age of Onset  . Diabetes Mother     Type 2  . Anemia Sister   . Hypertension Sister   . Anesthesia problems Neg Hx     Medications:  Prenatal vitamins,  Current Facility-Administered Medications  Medication Dose Route Frequency Provider Last Rate Last Dose  . ceFAZolin (ANCEF) 2-3 GM-% IVPB SOLR           .  ceFAZolin (ANCEF) IVPB 2 g/50 mL premix  2 g Intravenous Once Willodean Rosenthal, MD      . lactated ringers infusion   Intravenous Continuous Velna Hatchet, MD 125 mL/hr at 06/26/12 1242     Facility-Administered Medications Ordered in Other Encounters  Medication Dose Route Frequency Provider Last Rate Last Dose  . DISCONTD: ceFAZolin (ANCEF) 2-3 GM-% IVPB SOLR           . DISCONTD: ceFAZolin (ANCEF) IVPB 2 g/50 mL premix  2 g Intravenous Once Willodean Rosenthal, MD      . DISCONTD: heparin flush 10 UNIT/ML injection 10 Units  10 Units Intravenous Once Velna Hatchet, MD      . DISCONTD: heparin lock flush 100 unit/mL  500 Units Intravenous Once Velna Hatchet, MD      . DISCONTD: lactated ringers infusion   Intravenous Continuous Dana Allan, MD 125 mL/hr at 06/25/12 1240     . DISCONTD: scopolamine (TRANSDERM-SCOP) 1.5 MG 1.5 mg  1 patch Transdermal Once Dana Allan, MD   1.5 mg at 06/25/12 1241    Allergies: No Known Allergies  Review of Systems: - Negative  Physical Exam: Blood pressure 129/74, pulse 84, temperature 98.4 F (36.9 C), temperature source Oral, resp. rate 18, last menstrual period 09/26/2011, SpO2 100.00%. GENERAL: Well-developed, well-nourished female in no acute distress.  LUNGS: Clear to auscultation bilaterally.  HEART: Regular rate and rhythm. ABDOMEN: Soft, nontender, nondistended, gravid. EFW 7 lbs EXTREMITIES: Nontender, no edema, 2+ distal pulses.   Pertinent Labs/Studies:  none  Assessment : Paula Benson is a 26 y.o. [phone] at [redacted]w[redacted]d being admitted for elective repeat cesarean section  Plan: The risks of cesarean section discussed with the patient included but were not limited to: bleeding which may require transfusion or reoperation; infection which may require antibiotics; injury to bowel, bladder, ureters or other surrounding organs; injury to the fetus; need for additional procedures including hysterectomy in the event of a life-threatening hemorrhage; placental abnormalities wth subsequent pregnancies, incisional problems, thromboembolic phenomenon and other postoperative/anesthesia complications. The patient concurred with the proposed plan, giving informed written consent for the procedure.   Patient has been NPO since last night, she will remain NPO for procedure. Last dose of Lovenox was 7/21 at 8pm.  Anesthesia and OR aware.  Preoperative prophylactic Ancef ordered on call to the OR.  To OR when ready.    STINSON, JACOB JEHIEL 06/26/2012, 1:40 PM

## 2012-06-26 NOTE — Transfer of Care (Signed)
Immediate Anesthesia Transfer of Care Note  Patient: Paula Benson  Procedure(s) Performed: Procedure(s) (LRB): CESAREAN SECTION (N/A)  Patient Location: PACU  Anesthesia Type: Spinal  Level of Consciousness: awake, alert  and oriented  Airway & Oxygen Therapy: Patient Spontanous Breathing  Post-op Assessment: Report given to PACU RN and Post -op Vital signs reviewed and stable  Post vital signs: stable  Complications: No apparent anesthesia complications

## 2012-06-26 NOTE — Op Note (Signed)
Paula Benson PROCEDURE DATE: 06/26/2012  PREOPERATIVE DIAGNOSIS: Intrauterine pregnancy at  [redacted]w[redacted]d weeks gestation; elective repeat  POSTOPERATIVE DIAGNOSIS: The same  PROCEDURE: Repeat Low Transverse Cesarean Section  SURGEON:  Dr. Willodean Rosenthal  ASSISTANT: Dr Candelaria Celeste  INDICATIONS: Paula Benson is a 26 y.o. [phone] at [redacted]w[redacted]d scheduled for cesarean section secondary to elective repeat.  The risks of cesarean section discussed with the patient included but were not limited to: bleeding which may require transfusion or reoperation; infection which may require antibiotics; injury to bowel, bladder, ureters or other surrounding organs; injury to the fetus; need for additional procedures including hysterectomy in the event of a life-threatening hemorrhage; placental abnormalities wth subsequent pregnancies, incisional problems, thromboembolic phenomenon and other postoperative/anesthesia complications. The patient concurred with the proposed plan, giving informed written consent for the procedure.    FINDINGS:  Viable female infant in cephalic presentation.  Apgars 8 and 9, weight pending.  Clear amniotic fluid.  Intact placenta, three vessel cord.  Normal uterus, fallopian tubes and ovaries bilaterally.  ANESTHESIA:    Spinal INTRAVENOUS FLUIDS:2800 ml ESTIMATED BLOOD LOSS: 600 ml URINE OUTPUT:  300 ml SPECIMENS: Placenta sent to L&D COMPLICATIONS: None immediate  PROCEDURE IN DETAIL:  The patient received intravenous antibiotics and had sequential compression devices applied to her lower extremities while in the preoperative area.  She was then taken to the operating room where spinal anesthesia was administered and was found to be adequate. She was then placed in a dorsal supine position with a leftward tilt, and prepped and draped in a sterile manner.  A foley catheter was placed into her bladder and attached to constant gravity, which drained clear fluid throughout.  After  an adequate timeout was performed, a Pfannenstiel skin incision was made with scalpel and carried through to the underlying layer of fascia. The fascia was incised in the midline and this incision was extended bilaterally using the Mayo scissors. Kocher clamps were applied to the superior aspect of the fascial incision and the underlying rectus muscles were dissected off sharply with Mayos. A similar process was carried out on the inferior aspect of the facial incision. The rectus muscles were separated in the midline bluntly and the peritoneum was entered bluntly to find the omentum adhered to the anterior wall of the uterus and to the peritoneum.  The adhesions were taken down.  Attention was turned to the lower uterine segment where a transverse hysterotomy was made with a scalpel and extended bilaterally bluntly. The bladder blade was then removed. The infant was successfully delivered, and cord was clamped and cut and infant was handed over to awaiting neonatology team. Uterine massage was then administered and the placenta delivered intact with three-vessel cord. The uterus was then cleared of clot and debris.  The hysterotomy was closed with 0 Vicryl in a running locked fashion. Overall, excellent hemostasis was noted. The abdomen and the pelvis were irrigated and cleared of all clot and debris. Hemostasis was confirmed on all surfaces.  The rectus muscles and peritoneum were reapproximated using 0 vicryl. The fascia was then closed using 0 looped PDS in a running fashion.  The skin was closed with 4-0 Vicryl. The patient tolerated the procedure well. Sponge, lap, instrument and needle counts were correct x 2. She was taken to the recovery room in stable condition.   Levie Heritage, DO 06/26/2012 3:37 PM

## 2012-06-26 NOTE — Anesthesia Procedure Notes (Signed)
Spinal  Patient location during procedure: OR Start time: 06/26/2012 2:21 PM Staffing Anesthesiologist: Brayton Caves R Performed by: anesthesiologist  Preanesthetic Checklist Completed: patient identified, site marked, surgical consent, pre-op evaluation, timeout performed, IV checked, risks and benefits discussed and monitors and equipment checked Spinal Block Patient position: sitting Prep: DuraPrep Patient monitoring: heart rate, cardiac monitor, continuous pulse ox and blood pressure Approach: midline Location: L3-4 Injection technique: single-shot Needle Needle type: Sprotte  Needle gauge: 24 G Needle length: 9 cm Assessment Sensory level: T4 Additional Notes Patient identified.  Risk benefits discussed including failed block, incomplete pain control, headache, nerve damage, paralysis, blood pressure changes, nausea, vomiting, reactions to medication both toxic or allergic, and postpartum back pain.  Patient expressed understanding and wished to proceed.  All questions were answered.  Sterile technique used throughout procedure.  CSF was clear.  No parasthesia or other complications.  Please see nursing notes for vital signs.

## 2012-06-27 ENCOUNTER — Encounter (HOSPITAL_COMMUNITY): Payer: Self-pay | Admitting: Obstetrics & Gynecology

## 2012-06-27 LAB — CBC
HCT: 25.2 % — ABNORMAL LOW (ref 36.0–46.0)
Hemoglobin: 8 g/dL — ABNORMAL LOW (ref 12.0–15.0)
MCV: 83.2 fL (ref 78.0–100.0)
RBC: 3.03 MIL/uL — ABNORMAL LOW (ref 3.87–5.11)
WBC: 9 10*3/uL (ref 4.0–10.5)

## 2012-06-27 MED ORDER — WARFARIN SODIUM 5 MG PO TABS
5.0000 mg | ORAL_TABLET | Freq: Once | ORAL | Status: AC
  Administered 2012-06-27: 5 mg via ORAL
  Filled 2012-06-27: qty 1

## 2012-06-27 MED ORDER — ENOXAPARIN SODIUM 80 MG/0.8ML ~~LOC~~ SOLN
70.0000 mg | Freq: Two times a day (BID) | SUBCUTANEOUS | Status: DC
  Administered 2012-06-27 (×2): 70 mg via SUBCUTANEOUS
  Administered 2012-06-28: 10:00:00 via SUBCUTANEOUS
  Administered 2012-06-28 – 2012-06-29 (×2): 70 mg via SUBCUTANEOUS
  Filled 2012-06-27 (×7): qty 0.8

## 2012-06-27 MED ORDER — WARFARIN - PHYSICIAN DOSING INPATIENT: Freq: Every day | Status: DC

## 2012-06-27 MED ORDER — ACYCLOVIR 200 MG PO CAPS
400.0000 mg | ORAL_CAPSULE | Freq: Two times a day (BID) | ORAL | Status: DC
  Administered 2012-06-27 – 2012-06-29 (×5): 400 mg via ORAL
  Filled 2012-06-27 (×7): qty 2

## 2012-06-27 NOTE — Addendum Note (Signed)
Addendum  created 06/27/12 1400 by Renford Dills, CRNA   Modules edited:Notes Section

## 2012-06-27 NOTE — Anesthesia Postprocedure Evaluation (Signed)
  Anesthesia Post-op Note  Patient: Paula Benson  Procedure(s) Performed: Procedure(s) (LRB): CESAREAN SECTION (N/A)  Patient Location: Mother/Baby  Anesthesia Type: Spinal  Level of Consciousness: awake  Airway and Oxygen Therapy: Patient Spontanous Breathing  Post-op Pain: mild  Post-op Assessment: Patient's Cardiovascular Status Stable and Respiratory Function Stable  Post-op Vital Signs: stable  Complications: No apparent anesthesia complications

## 2012-06-27 NOTE — Progress Notes (Signed)
UR chart review completed.  

## 2012-06-27 NOTE — Progress Notes (Signed)
Subjective: Postpartum Day 1: Cesarean Delivery Patient reports incisional pain and tolerating PO.    Objective: Vital signs in last 24 hours: Temp:  [97.6 F (36.4 C)-99.2 F (37.3 C)] 97.6 F (36.4 C) (07/24 0619) Pulse Rate:  [55-99] 80  (07/24 0619) Resp:  [12-23] 18  (07/24 0619) BP: (98-129)/(59-81) 104/68 mmHg (07/24 0619) SpO2:  [93 %-100 %] 96 % (07/24 0619) Weight:  [90.266 kg (199 lb)] 90.266 kg (199 lb) (07/23 1700)  Physical Exam:  General: alert, cooperative and no distress Lochia: appropriate Uterine Fundus: firm Incision: dressing with minimal bleeding DVT Evaluation: Negative Homan's sign, no calf tenderness or edema   Basename 06/27/12 0510  HGB 8.0*  HCT 25.2*    Assessment/Plan: Status post Cesarean section. Doing well postoperatively.  Continue current care.  Pt plans to schedule for IUD insertion when possible. Mother is breast feeding.  [place] 06/27/2012, 7:56 AM

## 2012-06-27 NOTE — Progress Notes (Signed)
I have seen and examined this patient and I agree with the above. Start Lovenox today. Cam Hai 8:03 AM 06/27/2012

## 2012-06-28 MED ORDER — WARFARIN SODIUM 5 MG PO TABS
5.0000 mg | ORAL_TABLET | Freq: Every day | ORAL | Status: DC
  Filled 2012-06-28 (×2): qty 1

## 2012-06-28 NOTE — Progress Notes (Signed)
Subjective: Postpartum Day 2: Cesarean Delivery Patient reports tolerating PO, + flatus and no problems voiding.  No report of shortness of breath or chest pain.  Desires IUD, taking coumadin as scheduled.  No dizziness reported. +nausea.  Objective: Vital signs in last 24 hours: Temp:  [97.6 F (36.4 C)-99.2 F (37.3 C)] 97.6 F (36.4 C) (07/25 0500) Pulse Rate:  [76-94] 79  (07/25 0500) Resp:  [18] 18  (07/25 0500) BP: (116-124)/(76-83) 121/83 mmHg (07/25 0500) SpO2:  [98 %] 98 % (07/24 1435)  Physical Exam:  General: alert, cooperative and appears stated age CVS:  RRR, without murmur, gallops, or rubs Lungs:  CTA bilat ABD:  +BSx4, normal Lochia: appropriate Uterine Fundus: firm Incision: no significant drainage; no signs of infection; steri strips in place. DVT Evaluation: No evidence of DVT seen on physical exam. Negative Homan's sign.  Basename 06/27/12 0510  HGB 8.0*  HCT 25.2*    Assessment/Plan: Anemia - asymptomatic Status post Cesarean section. Doing well postoperatively.  Continue current care. Emphasized to report any chest pain or shortness of breath.  [facility] 06/28/2012, 7:39 AM

## 2012-06-29 LAB — TYPE AND SCREEN: Unit division: 0

## 2012-06-29 LAB — PROTIME-INR: Prothrombin Time: 13.5 seconds (ref 11.6–15.2)

## 2012-06-29 MED ORDER — FLUOXETINE HCL 20 MG PO CAPS: 20.0000 mg | ORAL_CAPSULE | Freq: Every day | ORAL | Status: DC

## 2012-06-29 MED ORDER — ALBUTEROL SULFATE HFA 108 (90 BASE) MCG/ACT IN AERS: 2.0000 | INHALATION_SPRAY | RESPIRATORY_TRACT | Status: DC | PRN

## 2012-06-29 MED ORDER — WARFARIN SODIUM 5 MG PO TABS: 5.0000 mg | ORAL_TABLET | Freq: Every day | ORAL | Status: DC

## 2012-06-29 MED ORDER — ALBUTEROL SULFATE HFA 108 (90 BASE) MCG/ACT IN AERS
2.0000 | INHALATION_SPRAY | RESPIRATORY_TRACT | Status: DC | PRN
  Administered 2012-06-29: 2 via RESPIRATORY_TRACT
  Filled 2012-06-29: qty 6.7

## 2012-06-29 MED ORDER — WARFARIN SODIUM 10 MG PO TABS
10.0000 mg | ORAL_TABLET | Freq: Once | ORAL | Status: AC
  Administered 2012-06-29: 10 mg via ORAL
  Filled 2012-06-29: qty 1

## 2012-06-29 MED ORDER — SENNOSIDES-DOCUSATE SODIUM 8.6-50 MG PO TABS: 2.0000 | ORAL_TABLET | Freq: Every day | ORAL | Status: DC

## 2012-06-29 MED ORDER — OXYCODONE-ACETAMINOPHEN 5-325 MG PO TABS: 1.0000 | ORAL_TABLET | ORAL | Status: AC | PRN

## 2012-06-29 NOTE — Discharge Summary (Signed)
Patient seen and examined.  Agree with above note.  Levie Heritage, DO 06/29/2012 10:49 AM

## 2012-06-29 NOTE — Discharge Summary (Signed)
  Obstetric Discharge Summary Reason for Admission: cesarean section Prenatal Procedures: none Intrapartum Procedures: cesarean: low cervical, transverse Postpartum Procedures: none Complications-Operative and Postpartum: none Hemoglobin  Date Value Range Status  06/27/2012 8.0* 12.0 - 15.0 g/dL Final     HCT  Date Value Range Status  06/27/2012 25.2* 36.0 - 46.0 % Final    Physical Exam:  General: alert, cooperative and no distress Lochia: appropriate Uterine Fundus: firm Incision: healing well, no significant drainage DVT Evaluation: No evidence of DVT seen on physical exam. Negative Homan's sign. Calf/Ankle edema is present. 2+ pitting B/L LE  Discharge Diagnoses: Term Pregnancy-delivered  Discharge Information: Date: 06/29/2012 Activity: pelvic rest Diet: routine Medications: PNV, Ibuprofen and Percocet Condition: stable Instructions: refer to practice specific booklet Discharge to: home Contraception: IUD Follow-up Information    Follow up with [facility]. Schedule an appointment as soon as possible for a visit in 6 weeks.   Contact information:   [address] [place] [address] [phone]         Newborn Data: Live born female  Birth Weight: 7 lb 1.9 oz (3230 g) APGAR: ,  Breast feeding well  Home with mother.  Lewie Chamber 06/29/2012, 7:55 AM

## 2012-06-29 NOTE — Clinical Social Work Psychosocial (Signed)
    Clinical Social Work Department BRIEF PSYCHOSOCIAL ASSESSMENT 06/29/2012  Patient:  Paula Benson, Paula Benson     Account Number:  [id number]     Admit date:  06/26/2012  Clinical Social Worker:  Andy Gauss  Date/Time:  06/29/2012 03:40 PM  Referred by:  Physician  Date Referred:  06/29/2012 Referred for  Behavioral Health Issues   Other Referral:   Hx of panic attacks   Interview type:  Patient Other interview type:    PSYCHOSOCIAL DATA Living Status:  ALONE Admitted from facility:   Level of care:   Primary support name:  Earleen Newport Primary support relationship to patient:  FRIEND Degree of support available:   Involved    CURRENT CONCERNS Current Concerns  Behavioral Health Issues   Other Concerns:    SOCIAL WORK ASSESSMENT / PLAN Pt acknowledges a history of anxiety symptoms that have been an issue for the past 2 years.  Her symptoms were never treated with medication, as she told Sw that she "just dealt with it."  Pt estimates that she has a panic attack, once every couple of months.  She believes her attacks are triggered by stress, but she failed to elaborate.  She admits to [facility], 2 years ago.  She did not have a plan or attempt.  She denies any SI since that time.  She acknowledges some depression symptoms during her pregnancy and reports a history of PP depression.  Sw discussed the increased risk of her experiencing PP depression and talked about some treatment options, (medication or therapy).  Pt expressed interest in having a prescription for an anti-depressant, as a precaution.  Sw informed bedside RN and requested that pt's OB be notified.  Pt support system is limited, since her family lives in [place].  FOB is at the bedside and supportive.  Pt has all the necessary supplies for the infant and was observed bonding well.  Sw available to assist further if needed.   Assessment/plan status:  No Further Intervention Required Other assessment/ plan:     Information/referral to community resources:   Medication and or therapy recommended to pt.    PATIENT'S/FAMILY'S RESPONSE TO PLAN OF CARE: Pt was receptive to [employer] discussed.

## 2012-07-02 ENCOUNTER — Telehealth: Payer: Self-pay | Admitting: *Deleted

## 2012-07-02 NOTE — Telephone Encounter (Signed)
Patient  Called and left a message that she has questions about her incision- states she is having some sharp pains on right side, states her incision was wet with pink discharge and has a funny smell to it. Also states she is having diarrhea when she eats and is using immodium.  Patient had support person look at incision while she was on phone, and he denied any openings in the incisiion, denies redness or drainage at present, also denies fever.  Patient states she is taking pain med as needed Asked if she is lifting anything heavier than her baby and states she is trying not to, but has other children.  Reinforced to patient she should not be lifting anything heavier than her infant.  Due to c/o funny smell gave patient appt for 06/24/12 at 10:45 and instructed her to go to MAU before then if incision opens up, has different color discharge, fever, etc.  Patient voices understanding.

## 2012-07-03 ENCOUNTER — Other Ambulatory Visit: Payer: Medicaid Other

## 2012-07-04 ENCOUNTER — Ambulatory Visit (INDEPENDENT_AMBULATORY_CARE_PROVIDER_SITE_OTHER): Payer: Medicaid Other | Admitting: Family Medicine

## 2012-07-04 ENCOUNTER — Encounter: Payer: Self-pay | Admitting: Family

## 2012-07-04 ENCOUNTER — Encounter (HOSPITAL_COMMUNITY): Payer: Self-pay | Admitting: *Deleted

## 2012-07-04 ENCOUNTER — Inpatient Hospital Stay (HOSPITAL_COMMUNITY)
Admission: AD | Admit: 2012-07-04 | Discharge: 2012-07-06 | DRG: 776 | Disposition: A | Discharge status: Home / Self Care | Payer: Medicaid Other | Source: Ambulatory Visit | Attending: Obstetrics and Gynecology | Admitting: Obstetrics and Gynecology

## 2012-07-04 ENCOUNTER — Encounter (HOSPITAL_COMMUNITY): Payer: Self-pay | Admitting: Anesthesiology

## 2012-07-04 VITALS — BP 180/100 | HR 55 | Temp 97.2°F | Ht 64.0 in | Wt 189.0 lb

## 2012-07-04 DIAGNOSIS — R197 Diarrhea, unspecified: Secondary | ICD-10-CM

## 2012-07-04 DIAGNOSIS — O99893 Other specified diseases and conditions complicating puerperium: Secondary | ICD-10-CM | POA: Diagnosis present

## 2012-07-04 DIAGNOSIS — Z86711 Personal history of pulmonary embolism: Secondary | ICD-10-CM

## 2012-07-04 DIAGNOSIS — O1490 Unspecified pre-eclampsia, unspecified trimester: Secondary | ICD-10-CM

## 2012-07-04 DIAGNOSIS — Z09 Encounter for follow-up examination after completed treatment for conditions other than malignant neoplasm: Secondary | ICD-10-CM

## 2012-07-04 DIAGNOSIS — IMO0002 Reserved for concepts with insufficient information to code with codable children: Principal | ICD-10-CM | POA: Diagnosis present

## 2012-07-04 DIAGNOSIS — O9989 Other specified diseases and conditions complicating pregnancy, childbirth and the puerperium: Secondary | ICD-10-CM

## 2012-07-04 LAB — COMPREHENSIVE METABOLIC PANEL
ALT: 121 U/L — ABNORMAL HIGH (ref 0–35)
AST: 44 U/L — ABNORMAL HIGH (ref 0–37)
Albumin: 2.8 g/dL — ABNORMAL LOW (ref 3.5–5.2)
Alkaline Phosphatase: 108 U/L (ref 39–117)
CO2: 21 mEq/L (ref 19–32)
Chloride: 106 mEq/L (ref 96–112)
Creatinine, Ser: 0.82 mg/dL (ref 0.50–1.10)
GFR calc non Af Amer: >90 mL/min (ref >90)
Potassium: 3.7 mEq/L (ref 3.5–5.1)
Sodium: 139 mEq/L (ref 135–145)
Total Bilirubin: 0.3 mg/dL (ref 0.3–1.2)

## 2012-07-04 LAB — CBC
MCV: 85.3 fL (ref 78.0–100.0)
Platelets: 314 10*3/uL (ref 150–400)
RBC: 3.41 MIL/uL — ABNORMAL LOW (ref 3.87–5.11)
RDW: 17.2 % — ABNORMAL HIGH (ref 11.5–15.5)
WBC: 7.2 10*3/uL (ref 4.0–10.5)

## 2012-07-04 LAB — PROTEIN / CREATININE RATIO, URINE
Creatinine, Urine: 32.73 mg/dL
Protein Creatinine Ratio: 0.16 — ABNORMAL HIGH (ref 0.00–0.15)

## 2012-07-04 MED ORDER — ACETAMINOPHEN 325 MG PO TABS
650.0000 mg | ORAL_TABLET | Freq: Four times a day (QID) | ORAL | Status: DC | PRN
  Administered 2012-07-04 – 2012-07-05 (×3): 650 mg via ORAL
  Filled 2012-07-04: qty 1
  Filled 2012-07-04 (×3): qty 2

## 2012-07-04 MED ORDER — DOCUSATE SODIUM 100 MG PO CAPS: 100.0000 mg | ORAL_CAPSULE | Freq: Every day | ORAL | Status: DC | PRN

## 2012-07-04 MED ORDER — HYDRALAZINE HCL 20 MG/ML IJ SOLN: 10.0000 mg | INTRAMUSCULAR | Status: DC | PRN

## 2012-07-04 MED ORDER — ENOXAPARIN SODIUM 80 MG/0.8ML ~~LOC~~ SOLN
70.0000 mg | Freq: Two times a day (BID) | SUBCUTANEOUS | Status: DC
  Administered 2012-07-04 – 2012-07-05 (×3): 70 mg via SUBCUTANEOUS
  Filled 2012-07-04 (×3): qty 0.8

## 2012-07-04 MED ORDER — WARFARIN SODIUM 7.5 MG PO TABS
7.5000 mg | ORAL_TABLET | Freq: Once | ORAL | Status: AC
  Administered 2012-07-04: 7.5 mg via ORAL
  Filled 2012-07-04: qty 1

## 2012-07-04 MED ORDER — MAGNESIUM SULFATE BOLUS VIA INFUSION
4.0000 g | Freq: Once | INTRAVENOUS | Status: AC
  Administered 2012-07-04: 4 g via INTRAVENOUS
  Filled 2012-07-04: qty 500

## 2012-07-04 MED ORDER — MAGNESIUM SULFATE 40 G IN LACTATED RINGERS - SIMPLE
2.0000 g/h | INTRAVENOUS | Status: DC
  Filled 2012-07-04 (×2): qty 500

## 2012-07-04 MED ORDER — LOPERAMIDE HCL 2 MG PO CAPS
4.0000 mg | ORAL_CAPSULE | Freq: Four times a day (QID) | ORAL | Status: DC | PRN
  Administered 2012-07-04 – 2012-07-06 (×4): 4 mg via ORAL
  Filled 2012-07-04 (×3): qty 2

## 2012-07-04 MED ORDER — ALUM & MAG HYDROXIDE-SIMETH 200-200-20 MG/5ML PO SUSP
30.0000 mL | Freq: Four times a day (QID) | ORAL | Status: DC | PRN
  Filled 2012-07-04: qty 30

## 2012-07-04 MED ORDER — ACETAMINOPHEN 650 MG RE SUPP: 650.0000 mg | Freq: Four times a day (QID) | RECTAL | Status: DC | PRN

## 2012-07-04 MED ORDER — LACTATED RINGERS IV SOLN
INTRAVENOUS | Status: DC
  Administered 2012-07-04 – 2012-07-05 (×2): via INTRAVENOUS

## 2012-07-04 MED ORDER — WARFARIN - PHARMACIST DOSING INPATIENT: Freq: Every day | Status: DC

## 2012-07-04 NOTE — Progress Notes (Signed)
ANTICOAGULATION CONSULT NOTE - Initial Consult  Pharmacy Consult for Coumadin Indication: History of PE  No Known Allergies  Vital Signs: Temp: 97.7 F (36.5 C) (07/31 1206) Temp src: Oral (07/31 1206) BP: 143/90 mmHg (07/31 1400) Pulse Rate: 55  (07/31 1036)  Labs:  Basename 07/04/12 1245  HGB 9.1*  HCT 29.1*  PLT 314  APTT --  LABPROT 19.6*  INR 1.63*  HEPARINUNFRC --  CREATININE 0.82  CKTOTAL --  CKMB --  TROPONINI --   Medical History: Past Medical History  Diagnosis Date  . Asthma   . 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 Bipolar II disorder   . Suicidal ideations   . History of blood clots     x3  . Anemia   . Urinary tract infection   . Herpes   . Anxiety   . Eclamptic seizure 2003  . Fibroid   . Genital herpes   . Preeclampsia 2003    with first pregnancy  . Pregnancy induced hypertension 2003    seized 2wks after  . Pyelonephritis 2012  . Seizures 2003    after first delivery  . Headache   . Migraines   . History of blood transfusion MARCH 2013  . Depression     doing ok  . Complication of anesthesia 2003    ASPIRATED DURING EMERGENCY CS    Medications:  Coumadin 5mg  PO daily Lovenox 70mg  SQ q12h  Assessment: Pt is a 26 yo F who is PPD#7 s/p C-section. Pt was readmitted for pre-eclampsia with a h/o post-partum eclamptic seizures. Was discharged 7/26 on Coumadin/Lovenox bridge therapy. Today is day # 7 of overlap. INR is still sub-therapeutic at 1.63, so will need to continue the Lovenox until her INR is > or = 2. On arrival, she had not taken her Lovenox this am in anticipation of her doctor's appointment today. She received her Lovenox dose shortly after admission.   Per patient, she has taken Coumadin 5mg  daily since discharge. When questioned about anticoagulation follow up, she indicated that she had not have her INR checked since discharge. She did not  seem to know who was supposed to be monitoring her Coumadin therapy outpatient.   Goal of Therapy:  INR 2-3 Monitor platelets by anticoagulation consult: yes  Plan:  1. Continue Lovenox 70mg  SQ q12h until INR therapeutic 2. Coumadin 7.5mg  PO x 1 today 3. Daily PT/INR 4. Will monitor pt clinically   Paula Benson 07/04/2012,2:36 PM

## 2012-07-04 NOTE — Progress Notes (Signed)
  Subjective:    Patient ID: Paula Benson, female    DOB: 04/11/1986, 26 y.o.   MRN: [id number]  HPI Patient seen for 1 week follow up from cesarean delivery.  She complains of a severe headache and peripheral edema.  Has history of eclamptic seizure 14 days postpartum with first pregnancy.  She also developed HTN after second pregnancy and was treated with magnesium.    She has watery diarrhea, but no cramping or vomiting.  Diarrhea not foul smelling and occurs after she eats.  Denies fevers, chills, abdominal pain, melena.     Review of Systems     Objective:   Physical Exam  Constitutional: She is oriented to person, place, and time. She appears well-developed and well-nourished.  Cardiovascular: Normal rate.   Pulmonary/Chest: Effort normal. No respiratory distress. She has no wheezes. She has no rales. She exhibits no tenderness.  Abdominal: Soft. She exhibits no distension and no mass. There is tenderness (incisional). There is no rebound and no guarding.  Neurological: She is alert and oriented to person, place, and time.       2 beat clonus bilaterally.  Psychiatric: She has a normal mood and affect. Her behavior is normal. Judgment and thought content normal.       Assessment & Plan:  1.  Preeclampsia  Admit directly to ICU.  Will get CBC, CMP, protein:creatinine ratio.  Start magnesium.   Will also get INR to adjust coumadin.

## 2012-07-04 NOTE — H&P (Signed)
Paula Benson is a 26 y.o. female presenting for elevated blood pressures to the severe range.  History   Patient is postpartum day 7 from a C section.  She presented to clinic today with elevated BP into severe range and was sent for admission for workup for preeclampsia.  Patient states she has been having headaches for the past couple of days.  Today she began seeing spots in her vision.  She is also dizzy.  She denies chest pain and urinary changes.  States she has been a little short of breath recently.  Has history of eclampsia with first pregnancy, had a seizure at 12 days postpartum and had HTN after second pregnancy and was treated with magnesium.   OB History    Grav Para Term Preterm Abortions TAB SAB Ect Mult Living   4 3 3  0 1 1 0 0 0 3     Past Medical History  Diagnosis Date  . Asthma   . 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 Bipolar II disorder   . Suicidal ideations   . History of blood clots     x3  . Anemia   . Urinary tract infection   . Herpes   . Anxiety   . Eclamptic seizure 2003  . Fibroid   . Genital herpes   . Preeclampsia 2003    with first pregnancy  . Pregnancy induced hypertension 2003    seized 2wks after  . Pyelonephritis 2012  . Seizures 2003    after first delivery  . Headache   . Migraines   . History of blood transfusion MARCH 2013  . Depression     doing ok  . Complication of anesthesia 2003    ASPIRATED DURING EMERGENCY CS   Past Surgical History  Procedure Date  . Cesarean section 12/2001 and 01/2008  . Induced abortion 01/2010  . Cesarean section 06/26/2012    Procedure: CESAREAN SECTION;  Surgeon: Willodean Rosenthal, MD;  Location: [facility];  Service: Obstetrics;  Laterality: N/A;   Family History: family history includes Anemia in her sister; Diabetes in her mother; and Hypertension in her sister.  There is no history of Anesthesia  problems. Social History:  reports that she has never smoked. She has never used smokeless tobacco. She reports that she does not drink alcohol or use illicit drugs.  ROS negative except per HPI    Blood pressure 177/114, temperature 97.7 F (36.5 C), temperature source Oral, resp. rate 18, last menstrual period 09/26/2011, SpO2 100.00%, currently breastfeeding. Exam Physical Exam  Constitutional: She is oriented to person, place, and time. She appears well-developed and well-nourished.  HENT:  Head: Normocephalic and atraumatic.  Cardiovascular: Normal rate, regular rhythm and normal heart sounds.   Respiratory: Effort normal and breath sounds normal.  GI: Soft. Bowel sounds are normal. She exhibits no distension. There is tenderness (incisional). There is no rebound and no guarding.  Musculoskeletal: She exhibits edema (lower extremity).  Neurological: She is alert and oriented to person, place, and time.       Right patellar reflex 2+, left patellar reflex 1+  Psychiatric: She has a normal mood and affect.    Assessment/Plan: Patient is a 26 yo [id number] 7 days postpartum following C-section who is admitted from clinic for severe range pressures and pre-eclampsia work up.  CBC, CMP, protein/Cr ratio, INR ordered. Patient to start magnesium.  Admit to  [facility] 07/04/2012, 12:18 PM

## 2012-07-05 ENCOUNTER — Encounter (HOSPITAL_COMMUNITY): Payer: Self-pay | Admitting: *Deleted

## 2012-07-05 LAB — COMPREHENSIVE METABOLIC PANEL
ALT: 84 U/L — ABNORMAL HIGH (ref 0–35)
Alkaline Phosphatase: 98 U/L (ref 39–117)
BUN: 6 mg/dL (ref 6–23)
CO2: 24 mEq/L (ref 19–32)
Chloride: 108 mEq/L (ref 96–112)
GFR calc Af Amer: >90 mL/min (ref >90)
Glucose, Bld: 84 mg/dL (ref 70–99)
Potassium: 3.7 mEq/L (ref 3.5–5.1)
Sodium: 141 mEq/L (ref 135–145)
Total Bilirubin: 0.2 mg/dL — ABNORMAL LOW (ref 0.3–1.2)
Total Protein: 5.9 g/dL — ABNORMAL LOW (ref 6.0–8.3)

## 2012-07-05 MED ORDER — OXYCODONE-ACETAMINOPHEN 5-325 MG PO TABS: 1.0000 | ORAL_TABLET | Freq: Four times a day (QID) | ORAL | Status: DC | PRN

## 2012-07-05 MED ORDER — OXYCODONE-ACETAMINOPHEN 5-325 MG PO TABS: 1.0000 | ORAL_TABLET | ORAL | Status: DC | PRN

## 2012-07-05 MED ORDER — WARFARIN SODIUM 5 MG PO TABS
5.0000 mg | ORAL_TABLET | Freq: Every day | ORAL | Status: DC
  Administered 2012-07-05 – 2012-07-06 (×2): 5 mg via ORAL
  Filled 2012-07-05 (×2): qty 1

## 2012-07-05 MED ORDER — PRENATAL MULTIVITAMIN CH
1.0000 | ORAL_TABLET | Freq: Every day | ORAL | Status: DC
  Administered 2012-07-05 – 2012-07-06 (×2): 1 via ORAL
  Filled 2012-07-05 (×2): qty 1

## 2012-07-05 MED ORDER — ACYCLOVIR 400 MG PO TABS
400.0000 mg | ORAL_TABLET | Freq: Two times a day (BID) | ORAL | Status: DC
  Administered 2012-07-05 – 2012-07-06 (×2): 400 mg via ORAL
  Filled 2012-07-05 (×4): qty 1

## 2012-07-05 MED ORDER — IBUPROFEN 600 MG PO TABS
600.0000 mg | ORAL_TABLET | Freq: Four times a day (QID) | ORAL | Status: DC | PRN
  Administered 2012-07-05 – 2012-07-06 (×4): 600 mg via ORAL
  Filled 2012-07-05 (×4): qty 1

## 2012-07-05 MED ORDER — ALBUTEROL SULFATE HFA 108 (90 BASE) MCG/ACT IN AERS
2.0000 | INHALATION_SPRAY | RESPIRATORY_TRACT | Status: DC | PRN
  Filled 2012-07-05: qty 6.7

## 2012-07-05 NOTE — Progress Notes (Addendum)
ANTICOAGULATION CONSULT NOTE - Follow Up Consult  Pharmacy Consult for Coumadin Indication: History of PE  No Known Allergies  Patient Measurements: Height: 5\' 4"  (162.6 cm) Weight: 188 lb 6.4 oz (85.458 kg) IBW/kg (Calculated) : 54.7    Vital Signs: Temp: 97.6 F (36.4 C) (08/01 0805) Temp src: Oral (08/01 0805) BP: 140/93 mmHg (08/01 1024) Pulse Rate: 88  (08/01 0805)  Labs:  Basename 07/05/12 0550 07/05/12 0545 07/04/12 1245  HGB -- -- 9.1*  HCT -- -- 29.1*  PLT -- -- 314  APTT -- -- --  LABPROT -- 23.0* 19.6*  INR -- 2.00* 1.63*  HEPARINUNFRC -- -- --  CREATININE 0.82 -- 0.82  CKTOTAL -- -- --  CKMB -- -- --  TROPONINI -- -- --    Estimated Creatinine Clearance: 108 ml/min (by C-G formula based on Cr of 0.82).   Medications:  Scheduled:    . enoxaparin (LOVENOX) injection  70 mg Subcutaneous Q12H  . magnesium  4 g Intravenous Once  . warfarin  7.5 mg Oral ONCE-1800  . Warfarin - Pharmacist Dosing Inpatient   Does not apply q1800    Assessment: Today is PPD #8, INR today is 2, within goal of 2-3.  RN and patient report  No bruising or bleeding.  Goal of Therapy:  INR 2-3 Monitor platelets by anticoagulation protocol: Yes   Plan:  Because pt's INR within goal 2-3, will D/C lovenox today and Give Coumadin 5mg  daily starting today at 1800. Ordered INR for tomorrow AM to ensure therapeutic before discharge.  Pt will need follow-up INR checks to ensure her INR stays within goal.   Spoke with Dr. Macon Large, and pt will be scheduled to come to clinic on Monday to get her INR checked.   Spoke with patient about stopping Lovenox today and continuing Coumadin 5mg  daily.  Informed her that the plan is to come to clinic on Monday to have her INR checked.  Reviewed foods with vitamin K and gave pt a handout and Coumadin booklet as education.  She had no further questions and understood importance of coming Monday to get INR checked.   Andrey Campanile, Cace Osorto  Scarlett 07/05/2012,10:59 AM

## 2012-07-05 NOTE — Progress Notes (Signed)
  Hospital day 2 Post Partum Day 8, readmitted for PP Preeclampsia, with hx of PE, being converted to Coumadin from lovenox due to hx PE x3-4 Subjective: tolerating PO and c/o postprandial diarrhea, also intermittent headaches treated with tylenol  Objective: Blood pressure 136/93, pulse 67, temperature 98 F (36.7 C), temperature source Axillary, resp. rate 18, height 5\' 4"  (1.626 m), weight 85.458 kg (188 lb 6.4 oz), last menstrual period 09/26/2011, SpO2 98.00%, currently breastfeeding.  Physical Exam:  General: alert and cooperative Lochia: appropriate Uterine Fundus: absent Incision: healing well DVT Evaluation: No evidence of DVT seen on physical exam.   Basename 07/04/12 1245  HGB 9.1*  HCT 29.1*  INR improved to 2.0,   Assessment/Plan: Postpartum preeclampsia, on Mg Sulfate, to complete 24 hrs at 1 pm Improving LFT"S Hx PE , being converted to Coumadin Inpatient til am.   LOS: 1 day   Kerstyn Coryell V 07/05/2012, 8:42 AM

## 2012-07-05 NOTE — Progress Notes (Signed)
UR chart review completed.  

## 2012-07-05 NOTE — H&P (Signed)
Attestation of Attending Supervision of Advanced Practitioner: Evaluation and management procedures were performed by the PA/NP/CNM/OB Fellow under my supervision/collaboration. Chart reviewed and agree with management and plan.  Labs include INR of 1.63, so pt being continued on Lovenox as well as Coumadin. Ast/alt of 44/121, no RUQ pain, 1+ reflexes, no clonus. Lungs clear..  Will recheck labs in am.  Serina Nichter V 07/05/2012 12:27 AM

## 2012-07-06 DIAGNOSIS — IMO0002 Reserved for concepts with insufficient information to code with codable children: Secondary | ICD-10-CM

## 2012-07-06 LAB — PROTIME-INR: Prothrombin Time: 24.6 seconds — ABNORMAL HIGH (ref 11.6–15.2)

## 2012-07-06 MED ORDER — ACYCLOVIR 200 MG PO CAPS
400.0000 mg | ORAL_CAPSULE | Freq: Two times a day (BID) | ORAL | Status: DC
  Filled 2012-07-06 (×2): qty 2

## 2012-07-06 MED ORDER — ENALAPRIL MALEATE 10 MG PO TABS: 10.0000 mg | ORAL_TABLET | ORAL | Status: DC

## 2012-07-06 MED ORDER — SODIUM CHLORIDE 0.9 % IV BOLUS (SEPSIS)
500.0000 mL | Freq: Once | INTRAVENOUS | Status: AC
  Administered 2012-07-06: 500 mL via INTRAVENOUS

## 2012-07-06 MED ORDER — ENALAPRIL MALEATE 10 MG PO TABS
10.0000 mg | ORAL_TABLET | ORAL | Status: AC
  Administered 2012-07-06: 10 mg via ORAL
  Filled 2012-07-06: qty 1

## 2012-07-06 MED ORDER — ENALAPRIL MALEATE 10 MG PO TABS: 10.0000 mg | ORAL_TABLET | Freq: Every day | ORAL | Status: DC

## 2012-07-06 MED ORDER — ENALAPRIL MALEATE 5 MG PO TABS
5.0000 mg | ORAL_TABLET | ORAL | Status: DC
  Filled 2012-07-06: qty 1

## 2012-07-06 MED ORDER — HYDROCHLOROTHIAZIDE 50 MG PO TABS: 50.0000 mg | ORAL_TABLET | Freq: Every day | ORAL | Status: DC

## 2012-07-06 MED ORDER — WARFARIN SODIUM 5 MG PO TABS: 5.0000 mg | ORAL_TABLET | Freq: Every day | ORAL | Status: DC

## 2012-07-06 MED ORDER — HYDROCHLOROTHIAZIDE 50 MG PO TABS
50.0000 mg | ORAL_TABLET | Freq: Every day | ORAL | Status: DC
  Administered 2012-07-06: 50 mg via ORAL
  Filled 2012-07-06 (×2): qty 1

## 2012-07-06 MED ORDER — IBUPROFEN 600 MG PO TABS: 600.0000 mg | ORAL_TABLET | Freq: Four times a day (QID) | ORAL | Status: AC | PRN

## 2012-07-06 MED ORDER — LOPERAMIDE HCL 2 MG PO CAPS: 4.0000 mg | ORAL_CAPSULE | Freq: Four times a day (QID) | ORAL | Status: AC | PRN

## 2012-07-06 NOTE — Discharge Summary (Addendum)
Physician Discharge Summary  Patient ID: MAXX CALAWAY MRN: [id number] DOB/AGE: October 22, 1986 26 y.o.  Admit date: 07/04/2012 Discharge date: 07/06/2012  Admission Diagnoses: 1.  Preeclampsia 2.  S/p cesarean section x 9 days  Discharge Diagnoses:  Active Problems:  * No active hospital problems. *    Discharged Condition: stable  Hospital Course: This is a 26yo [id number] who is 9 days postpartum who was admitted for treatment of postpartum preeclampsia.  She was treated with magnesium for 24 hours for seizure prophylaxis.  She was observed overnight and remained stable. On day of discharge, the patient was started on HCTZ for elevated blood pressures.  She is being sent home on HCTZ to follow up in the clinic on Monday for INR check, BP check.  Consults: None  Significant Diagnostic Studies: LFTs normal, CBC normal  Treatments: magnesium  Discharge Exam: Blood pressure 170/101, pulse 55, temperature 98.6 F (37 C), temperature source Oral, resp. rate 20, height 5\' 4"  (1.626 m), weight 84.596 kg (186 lb 8 oz), last menstrual period 09/26/2011, SpO2 98.00%, currently breastfeeding. General appearance: alert, cooperative and no distress Head: Normocephalic, without obvious abnormality, atraumatic Eyes: conjunctivae/corneas clear. PERRL, EOM's intact. Fundi benign. Resp: clear to auscultation bilaterally Cardio: regular rate and rhythm, S1, S2 normal, no murmur, click, rub or gallop Pulses: 2+ and symmetric Neurologic: Alert and oriented X 3, normal strength and tone. Normal symmetric reflexes. Normal coordination and gait  Disposition: 01-Home or Self Care  Discharge Orders    Future Appointments: Provider: Department: Dept Phone: Center:   07/23/2012 1:30 PM Adam Phenix, MD [facility] [phone] [facility]     Medication List  As of 07/06/2012  3:56 PM   ASK your doctor about these medications         acyclovir 400 MG tablet   Commonly known as: ZOVIRAX   Take 400 mg  by mouth 2 (two) times daily. 1 pill 3x a day for 5 days then 1 pill twice daily. Disp QS for 1 month. Then 60 pills daily with refills.      albuterol 108 (90 BASE) MCG/ACT inhaler   Commonly known as: PROVENTIL HFA;VENTOLIN HFA   Inhale 2 puffs into the lungs every 4 (four) hours as needed for wheezing or shortness of breath. For dyspnea.      enoxaparin 80 MG/0.8ML injection   Commonly known as: LOVENOX   Inject 70 mg into the skin 2 (two) times daily.      multivitamin-prenatal 27-0.8 MG Tabs   Take 1 tablet by mouth daily.      oxyCODONE-acetaminophen 5-325 MG per tablet   Commonly known as: PERCOCET/ROXICET   Take 1-2 tablets by mouth every 4 (four) hours as needed (moderate - severe pain).      warfarin 5 MG tablet   Commonly known as: COUMADIN   Take 1 tablet (5 mg total) by mouth daily at 6 PM.           Discharge for this patient greater than 30 minutes.  SignedCandelaria Celeste JEHIEL 07/06/2012, 3:56 PM

## 2012-07-09 ENCOUNTER — Other Ambulatory Visit: Payer: Medicaid Other

## 2012-07-09 ENCOUNTER — Other Ambulatory Visit: Payer: Self-pay | Admitting: Physician Assistant

## 2012-07-09 VITALS — BP 146/98 | HR 111 | Wt 168.3 lb

## 2012-07-09 DIAGNOSIS — Z86711 Personal history of pulmonary embolism: Secondary | ICD-10-CM

## 2012-07-09 LAB — PROTIME-INR: Prothrombin Time: 19.3 seconds — ABNORMAL HIGH (ref 11.6–15.2)

## 2012-07-09 NOTE — Progress Notes (Addendum)
Pt here for BP check and lab draw PT/INR.  Pt informed me that she is not having any problems with urination.  Informed Maylon Cos, CNM of BP- stated that she is fine with that BP 146/98 continue to take Rx as prescribed and that we will call her with the results of PT/INR and notify pt of any changes.  Informed pt of PP visit scheduled for 07/23/12 @ 130pm.  Pt stated understanding and had no further questions.

## 2012-07-09 NOTE — Addendum Note (Signed)
Addended by: Faythe Casa on: 07/09/2012 02:15 PM   Modules accepted: Orders

## 2012-07-11 ENCOUNTER — Telehealth: Payer: Self-pay

## 2012-07-11 MED ORDER — WARFARIN SODIUM 7.5 MG PO TABS: 7.5000 mg | ORAL_TABLET | Freq: Every day | ORAL | Status: AC

## 2012-07-11 MED ORDER — WARFARIN SODIUM 7.5 MG PO TABS: 7.5000 mg | ORAL_TABLET | Freq: Every day | ORAL | Status: DC

## 2012-07-11 NOTE — Telephone Encounter (Signed)
Called pt and left message to return our call to the clinics its concerning results.  Re:  Pharmacy needs to be verified before placing new Rx order.

## 2012-07-11 NOTE — Telephone Encounter (Signed)
Message copied by Faythe Casa on Wed Jul 11, 2012  8:55 AM ------      Message from: August Luz      Created: Tue Jul 10, 2012  7:26 PM       Sub-therapeutic. Increase to 7.5mg  coumadin daily. Recheck in on Monday. Please call pt to instruct

## 2012-07-11 NOTE — Telephone Encounter (Signed)
Pt returned call and I informed pt of results and new medication management.  I verified pharmacy with the pt and she stated understanding.

## 2012-07-18 ENCOUNTER — Inpatient Hospital Stay: Payer: Medicaid Other | Admitting: Family Medicine

## 2012-07-23 ENCOUNTER — Ambulatory Visit: Payer: Medicaid Other | Admitting: Obstetrics & Gynecology

## 2012-07-27 ENCOUNTER — Ambulatory Visit: Payer: Medicaid Other | Admitting: Family Medicine

## 2012-07-30 ENCOUNTER — Ambulatory Visit: Payer: Medicaid Other

## 2012-08-14 ENCOUNTER — Other Ambulatory Visit: Payer: Self-pay | Admitting: *Deleted

## 2012-08-14 MED ORDER — ACYCLOVIR 200 MG/5ML PO SUSP: 200.0000 mg | Freq: Two times a day (BID) | ORAL | Status: AC

## 2012-08-14 NOTE — Telephone Encounter (Signed)
Pt called requesting a refill on her acyclovir.

## 2012-08-15 ENCOUNTER — Encounter: Payer: Self-pay | Admitting: *Deleted

## 2012-08-15 ENCOUNTER — Other Ambulatory Visit: Payer: Self-pay | Admitting: *Deleted

## 2012-08-15 ENCOUNTER — Telehealth: Payer: Self-pay | Admitting: *Deleted

## 2012-08-15 DIAGNOSIS — N760 Acute vaginitis: Secondary | ICD-10-CM

## 2012-08-15 MED ORDER — METRONIDAZOLE 500 MG PO TABS: 500.0000 mg | ORAL_TABLET | Freq: Two times a day (BID) | ORAL | Status: AC

## 2012-08-15 NOTE — Telephone Encounter (Signed)
Left message for patient to call us back.  

## 2012-08-15 NOTE — Telephone Encounter (Signed)
Pt returned call and I was unavailable, called her back and had to leave a voicemail.

## 2012-08-15 NOTE — Telephone Encounter (Signed)
Pt informed that medicine is at pharmacy. Also stated that she is having symptoms of BV. I advised that I would also send it flagyl per gyn protocol.

## 2012-08-15 NOTE — Telephone Encounter (Signed)
Message copied by Mannie Stabile on Wed Aug 15, 2012  3:13 PM ------      Message from: Catalina Antigua      Created: Tue Aug 14, 2012 12:01 PM       Please inform patient that acyclovir has been e-prescribed

## 2012-08-22 ENCOUNTER — Encounter: Payer: Self-pay | Admitting: Family Medicine

## 2012-08-22 ENCOUNTER — Ambulatory Visit (INDEPENDENT_AMBULATORY_CARE_PROVIDER_SITE_OTHER): Payer: Medicaid Other | Admitting: Family Medicine

## 2012-08-22 VITALS — BP 119/73 | HR 66 | Temp 97.9°F | Ht 64.0 in | Wt 164.0 lb

## 2012-08-22 DIAGNOSIS — B354 Tinea corporis: Secondary | ICD-10-CM

## 2012-08-22 DIAGNOSIS — R21 Rash and other nonspecific skin eruption: Secondary | ICD-10-CM

## 2012-08-22 MED ORDER — KETOCONAZOLE 2 % EX CREA: TOPICAL_CREAM | Freq: Every day | CUTANEOUS | Status: DC

## 2012-08-22 NOTE — Progress Notes (Signed)
  Subjective:    Patient ID: Paula Benson, female    DOB: 11-Jan-1986, 26 y.o.   MRN: [id number]  HPI Pt c/o spot/rash on left hip since in hospital for delivery about 6 weeks ago. Oval shaped, itchy. Has not gone away. Now has second spot on right wrist x 1 week. Oval shaped, very itchy. Daughter had skin rash a few months ago, did not look the same but used Clotrimazole cream and it worked. Pt has tried cream (1%) but it has not helped. History of eczema but this is different and has not had problems with eczema in a long time.  Review of Systems No fever/chills. Negative except as stated in HPI  Past Medical History  Diagnosis Date  . Asthma   . 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 Bipolar II disorder   . Suicidal ideations   . History of blood clots     x3  . Anemia   . Urinary tract infection   . Herpes   . Anxiety   . Eclamptic seizure 2003  . Fibroid   . Genital herpes   . Preeclampsia 2003    with first pregnancy  . Pregnancy induced hypertension 2003    seized 2wks after  . Pyelonephritis 2012  . Seizures 2003    after first delivery  . Headache   . Migraines   . History of blood transfusion MARCH 2013  . Depression     doing ok  . Complication of anesthesia 2003    ASPIRATED DURING EMERGENCY CS       Objective:   Physical Exam Filed Vitals:   08/22/12 1441  BP: 119/73  Pulse: 66  Temp: 97.9 F (36.6 C)   GEN:  WNWD no acute distress Skin:  2 cm oval, darkly pigmented patch with surrounding fine scale on L hip (trochanter area). 1 cm oval patch on R inner wrist, raised red border with scale, centrally smooth/somewhat hyperpigmented/red. No lesions on back, neck, legs, abdomen.  KOH done from scraping - positive for Hyphae     Assessment & Plan:  Tinea corporis - not responsive to clotrimazole. Will try ketoconazole 2% cream Pt advised to wash hands, caution with skin  contact with other children. Treat for 1 to 2 weeks or until resolved.

## 2012-08-22 NOTE — Patient Instructions (Addendum)
Use fungal cream daily for 1 to 2 weeks or until spots are resolved and for 2 to 3 days after. Wash hands frequently.    Fungus Infection of the Skin An infection of your skin caused by a fungus is a very common problem. Treatment depends on which part of the body is affected. Types of fungal skin infection include:  Athlete's Foot(Tinea pedis). This infection starts between the toes and may involve the entire sole and sides of foot. It is the most common fungal disease. It is made worse by heat, moisture, and friction. To treat, wash your feet 2 to 3 times daily. Dry thoroughly between the toes. Use medicated foot powder or cream as directed on the package. Plain talc, cornstarch, or rice powder may be dusted into socks and shoes to keep the feet dry. Wearing footwear that allows ventilation is also helpful.   Ringworm (Tinea corporis and tinea capitis). This infection causes scaly red rings to form on the skin or scalp. For skin sores, apply medicated lotion or cream as directed on the package. For the scalp, medicated shampoo may be used with with other therapies. Ringworm of the scalp or fingernails usually requires using oral medicine for 2 to 4 months.   Tinea versicolor. This infection appears as painless, scaly, patchy areas of discolored skin (whitish to light brown). It is more common in the summer and favors oily areas of the skin such as those found at the chest, abdomen, back, pubis, neck, and body folds. It can be treated with medicated shampoo or with medicated topical cream. Oral antifungals may be needed for more active infections. The light and/or dark spots may take time to get better and is not a sign of treatment failure.  Fungal infections may need to be treated for several weeks to be cured. It is important not to treat fungal infections with steroids or combination medicine that contains an antifungal and steroid as these will make the fungal infection worse. SEEK MEDICAL CARE IF:    You have persistent itching or rawness.   You have an oral temperature above 102 F (38.9 C).  Document Released: 12/29/2004 Document Revised: 11/10/2011 Document Reviewed: 03/16/2010 [facility] Patient Information 2012 [place], [place].

## 2012-08-24 ENCOUNTER — Ambulatory Visit: Payer: Medicaid Other | Admitting: Obstetrics and Gynecology

## 2013-07-15 ENCOUNTER — Ambulatory Visit: Payer: Self-pay | Admitting: Family Medicine

## 2013-07-16 ENCOUNTER — Encounter: Payer: Self-pay | Admitting: Family Medicine

## 2013-07-16 ENCOUNTER — Ambulatory Visit (INDEPENDENT_AMBULATORY_CARE_PROVIDER_SITE_OTHER): Payer: BC Managed Care – PPO | Admitting: Family Medicine

## 2013-07-16 ENCOUNTER — Telehealth: Payer: Self-pay | Admitting: Psychology

## 2013-07-16 VITALS — BP 123/82 | HR 84 | Ht 64.0 in | Wt 171.0 lb

## 2013-07-16 DIAGNOSIS — F410 Panic disorder [episodic paroxysmal anxiety] without agoraphobia: Secondary | ICD-10-CM

## 2013-07-16 DIAGNOSIS — Z3009 Encounter for other general counseling and advice on contraception: Secondary | ICD-10-CM

## 2013-07-16 DIAGNOSIS — R197 Diarrhea, unspecified: Secondary | ICD-10-CM

## 2013-07-16 DIAGNOSIS — H6123 Impacted cerumen, bilateral: Secondary | ICD-10-CM

## 2013-07-16 DIAGNOSIS — H6120 Impacted cerumen, unspecified ear: Secondary | ICD-10-CM

## 2013-07-16 DIAGNOSIS — A6000 Herpesviral infection of urogenital system, unspecified: Secondary | ICD-10-CM

## 2013-07-16 DIAGNOSIS — Z86711 Personal history of pulmonary embolism: Secondary | ICD-10-CM

## 2013-07-16 DIAGNOSIS — Z7901 Long term (current) use of anticoagulants: Secondary | ICD-10-CM

## 2013-07-16 MED ORDER — RIVAROXABAN 20 MG PO TABS: 20.0000 mg | ORAL_TABLET | Freq: Every day | ORAL | Status: DC

## 2013-07-16 MED ORDER — CARBAMIDE PEROXIDE 6.5 % OT SOLN: 5.0000 [drp] | Freq: Two times a day (BID) | OTIC | Status: DC

## 2013-07-16 MED ORDER — RIVAROXABAN 15 MG PO TABS: 15.0000 mg | ORAL_TABLET | Freq: Two times a day (BID) | ORAL | Status: DC

## 2013-07-16 MED ORDER — ACYCLOVIR 400 MG PO TABS: 400.0000 mg | ORAL_TABLET | Freq: Two times a day (BID) | ORAL | Status: DC

## 2013-07-16 NOTE — Assessment & Plan Note (Signed)
Patient with uncontrolled anxiety and stress interested in CBT/counseling -Patient was given business card of Dr. Pascal Lux at [facility]

## 2013-07-16 NOTE — Progress Notes (Signed)
Subjective:    Patient ID: Paula Benson, female    DOB: 1986-03-10, 27 y.o.   MRN: [id number]  HPI 27 year old female presents for followup of multiple medical problems:  History of pulmonary embolism x3: Patient has a previous history of pulmonary embolism and has been on chronic anticoagulation in the past, patient states she has been off her Coumadin for the past 6 months, she had been prescribed Coumadin after her last pregnancy while she was hospitalized for preeclampsia, patient states she completed one prescription and did not refill it due to insurance reasons, patient currently denies shortness of breath. Patient has not had INR checked in over 6 months  Birth control counseling: Patient is a G5 P3023, she states that the 2 abortions were elective, denies history of miscarriage, is not interested in additional children at this time, she is interested in the IUD, patient states that she is aware that she is not a candidate for oral birth control pills due to history of blood clots, denies recent intercourse, last menstrual period was 2 weeks ago  Ear pain: She has had left ear pain for the past few weeks, she is concerned that she may have an ear infection, no fevers or chills, mild decreased hearing on the left side, no rhinorrhea, no throat pain, no itchy eyes, no eye discharge, no ear discharge  Genital herpes: Patient has been on chronic suppression therapy in the past, she requires a new prescription, she states that she is currently having an acute flare of genital herpes  Diarrhea: Patient has had chronic diarrhea for the past year, occurs 1-2 times daily and mostly occurs after eating, it is worse after dairy products, she describes as loose to watery, she also has some abdominal pain after eating oatmeal in the morning, denies constipation, is asymptomatic in between meals, no blood in stool, no nausea vomiting no vomiting, she feels that her symptoms are likely related to her  anxiety  Anxiety: Patient states that she has chronic anxiety, has previously been treated with Klonopin which did provide some relief, she is interested in behavioral therapy/counseling. She has previously had suicidal thoughts. Denies current suicidal or homicidal thoughts. States her mood is down. Her appetite is decreased which is related to diarrhea, sleeping okay at night, her mother is currently sick which is contributing to her stress  Patient also has complaints of headache and skin rash there were not evaluated during today's visit.  Reviewed past family and social history. She is currently a nonsmoker.    Review of Systems  Constitutional: Positive for appetite change. Negative for fever, chills and fatigue.  HENT: Positive for hearing loss and ear pain. Negative for congestion, rhinorrhea, neck pain, neck stiffness, postnasal drip, tinnitus and ear discharge.   Eyes: Negative for pain, discharge and itching.  Respiratory: Negative for cough, choking, shortness of breath and wheezing.   Cardiovascular: Negative for chest pain.  Gastrointestinal: Positive for abdominal pain and diarrhea. Negative for nausea, vomiting and abdominal distention.  Psychiatric/Behavioral: Negative for suicidal ideas, behavioral problems and agitation.       Objective:   Physical Exam Vitals: Reviewed HEENT: External auditory canals unremarkable bilaterally, bilateral cerumen impaction present, unable to fully visualize tympanic membrane bilaterally due to cerumen impaction, pupils are equal round and reactive to light, extraocular movements were intact, no rhinorrhea present, nasal septum midline, moist mucous membranes, no pharyngeal erythema, neck was supple, no anterior or posterior lymphadenopathy noted Cardiac: Regular rate and rhythm, no heaves or thrills,  S1 and S2 present, no murmurs Respiratory: Good effort, clear to auscultation bilaterally Abdomen: Soft, nontender, positive bowel sounds, no  hepatosplenomegaly Extremities: No edema, 2+ dorsalis pedis pulses bilaterally      Assessment & Plan:  Please see problem specific assessment and plan

## 2013-07-16 NOTE — Assessment & Plan Note (Addendum)
27 year old female presents for followup of chronic anticoagulation for history of pulmonary embolism x3, she has been noncompliant with anticoagulation regimen and has been off Coumadin for approximately 6 months -No INR checked today due to long duration of therapy -Counseled patient on importance of taking anticoagulation medicines daily as she is high risk for repeat pulmonary embolism -Discussed other anticoagulation options and patient has elected to switch to Xarelto -Samples of Xarelto provided which will last for 5 days, prescription for Xarelto sent to pharmacy; patient to take 15 mg twice daily for 3 weeks then switch to 20 mg daily(prescription provided) -Discussed additional workup for hypercoagulable state (previous negative workup included antithrombin III, prothrombin, anticardiolipin, factor V Leiden deficiency). However given need for long-term anticoagulation further workup would not change management; no additional hypercoagulable workup performed today -We'll get baseline CBC and complete metabolic panel -Patient counseled on importance of taking her medications on a daily basis; patient was agreeable to taking vacations daily and stated understanding that she is at high-risk of DVT/pulmonary embolism if she stopped taking his medications

## 2013-07-16 NOTE — Assessment & Plan Note (Signed)
Chronic diarrhea most likely related to anxiety, other potential etiologies include but are not limited to irritable bowel syndrome, celiac disease, lactose intolerance, inflammatory bowel disease is another potential cause however this is low on the differential diagnosis -Patient agreeable to initial CBT therapy -She is to avoid dairy products and gluten products to see if her symptoms improve -If symptoms persist could consider additional workup for Crohn's disease and lactose intolerance

## 2013-07-16 NOTE — Assessment & Plan Note (Signed)
Patient is outbreak of genital herpes and is currently out of chronic suppression medication -Prescription of acyclovir refilled (400 mg twice daily)

## 2013-07-16 NOTE — Assessment & Plan Note (Signed)
27 year old female with history of pulmonary embolism x3; noncompliance with chronic anticoagulation -Anticoagulation therapy as outlined in plan under "chronic anticoagulation"

## 2013-07-16 NOTE — Assessment & Plan Note (Signed)
Patient is interested in birth control, she's had a candidate for oral birth control pills due to history of blood clots, she's interested in copper IUD, pregnancy test negative today -Risks and benefits of copper IUD described to patient -Patient to schedule appointment for IUD insertion

## 2013-07-16 NOTE — Patient Instructions (Addendum)
History of Blood clots-start Xaralto at 15 mg twice daily for 3 weeks then 20 mg daily;   Birth Control -will set up appointment for Copper IUD (please stop at front desk to make appointment)  Anxity - will refer to Dr. Pascal Lux, please call number on business card  Diarrhea - like related to anxiety, return if symptoms persist  Make separate appointment for follow up of headaches and ring worm.

## 2013-07-16 NOTE — Assessment & Plan Note (Addendum)
Patient has bilateral cerumen impaction - Dbrox otic drops provided

## 2013-07-16 NOTE — Telephone Encounter (Signed)
Paula Benson called to schedule an appointment.  Unfortunately, I am closed to new patients right now.  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  I encouraged her to utilize this resource as they are often very good (and free and confidential).  I also told her about www.psychologtoday.com website.  She is specifically looking for a therapist that treats OCD.  This search engine should help her narrow down the therapists who treat this condition regularly.  I asked her to call me back if she ran into any difficulty getting the help that she needs.

## 2013-07-22 ENCOUNTER — Telehealth: Payer: Self-pay | Admitting: Family Medicine

## 2013-07-22 NOTE — Telephone Encounter (Signed)
Patient is calling about her blood thinner, Lovenox, the expiration date is 10/15 so she needs to know if she should start taking them.

## 2013-07-22 NOTE — Telephone Encounter (Signed)
Left message for patient to return call. Please tell her NOT to take the Lovenox, since she is now taking Xarelto she will no longer need that med. Also ask if she is taking the Xarelto as follows:  15 mg twice daily for 3 weeks then 20 mg daily, Rx was sent to pharmacy.Busick, Rodena Medin

## 2013-07-23 NOTE — Telephone Encounter (Signed)
Called pt and she reports, that someone told her already. She did call back. i went over instructions with her again. Pt verbalized understanding. Lorenda Hatchet, [name]

## 2013-07-29 ENCOUNTER — Ambulatory Visit (INDEPENDENT_AMBULATORY_CARE_PROVIDER_SITE_OTHER): Payer: BC Managed Care – PPO | Admitting: Emergency Medicine

## 2013-07-29 ENCOUNTER — Encounter: Payer: Self-pay | Admitting: Emergency Medicine

## 2013-07-29 VITALS — BP 125/85 | HR 83 | Temp 97.7°F | Wt 173.0 lb

## 2013-07-29 DIAGNOSIS — K625 Hemorrhage of anus and rectum: Secondary | ICD-10-CM | POA: Insufficient documentation

## 2013-07-29 MED ORDER — OMEPRAZOLE 40 MG PO CPDR: 40.0000 mg | DELAYED_RELEASE_CAPSULE | Freq: Every day | ORAL | Status: DC

## 2013-07-29 NOTE — Assessment & Plan Note (Addendum)
Likely hemorrhoidal.  With epigastric pain could also be from gastritis, but less likely. FOBT negative. POC hemoglobin today. Start omeprazole. Restart xarelto. Likely need to start iron supplements. Follow up if bleeding recurs.  Hgb: 8.7.  Start iron pills and recheck in 1 month.

## 2013-07-29 NOTE — Progress Notes (Signed)
  Subjective:    Patient ID: Paula Benson, female    DOB: February 29, 1986, 29 y.o.   MRN: [id number]  HPI Paula Benson is here for a SDA for rectal bleeding.  She reports having a BM with bright red blood on Saturday.  She is on xarelto for chronic PEs.  She states that she had some pizza, felt a little nauseated and used the restroom.  She reports having a soft, non painful, non straining BM.  When she checked the toilet bowl, she thought her period had started again there was so much blood.  Has felt a little weak since then with some dizziness on standing.  No shortness of breath.  Did have one episode of a "weird" chest pain that last a few seconds and resolved on Friday or Saturday.  She states that she takes at least one pepto-bismol capsule daily for upset stomach.  I have reviewed and updated the following as appropriate: allergies and current medications SHx: never smoker   Review of Systems See HPI    Objective:   Physical Exam BP 125/85  Pulse 83  Temp(Src) 97.7 F (36.5 C) (Oral)  Wt 173 lb (78.472 kg)  BMI 29.68 kg/m2  LMP 07/02/2013 Gen: alert, cooperative, NAD HEENT: mild conjunctival pallor Abd: +BS, soft, ND, mild tenderness in epigastric region Rectal: small external hemorrhoid; normal tone, no fissures or internal hemorrhoids appreciated  FOBT: negative     Assessment & Plan:

## 2013-07-29 NOTE — Patient Instructions (Addendum)
It was nice to see you!  Your stool is negative for blood today. This could have come from your stomach or hemorrhoids.  Please restart your xarelto. Start taking omeprazole 1 pill once a day to block stomach acid.  You should start taking an iron supplement.  The bottle should say Ferrous Sulfate 325mg .  Take 1 pill once a day.  Follow up if bleeding recurs.

## 2013-07-30 ENCOUNTER — Telehealth: Payer: Self-pay | Admitting: *Deleted

## 2013-07-30 NOTE — Telephone Encounter (Signed)
Pt reports dizziness and chest pain that radiates to her back that has been going on for the past few days. Seen here yesterday for same complaint - pt believes that she needs blood transfusion and has had one in the past when hemg was around the same point. Recommended that pt seek further eval at the ED if symptoms have become worse and she believed that she needed transfusion as that is not something that we do here in the office  - pt verbalized understanding. Wyatt Haste, RN-BSN

## 2013-07-31 ENCOUNTER — Encounter (HOSPITAL_COMMUNITY): Payer: Self-pay | Admitting: Family Medicine

## 2013-07-31 ENCOUNTER — Emergency Department (HOSPITAL_COMMUNITY): Payer: BC Managed Care – PPO

## 2013-07-31 ENCOUNTER — Emergency Department (HOSPITAL_COMMUNITY)
Admission: EM | Admit: 2013-07-31 | Discharge: 2013-08-01 | Disposition: A | Discharge status: Home / Self Care | Payer: BC Managed Care – PPO | Attending: Emergency Medicine | Admitting: Emergency Medicine

## 2013-07-31 DIAGNOSIS — Z862 Personal history of diseases of the blood and blood-forming organs and certain disorders involving the immune mechanism: Secondary | ICD-10-CM | POA: Insufficient documentation

## 2013-07-31 DIAGNOSIS — Z8679 Personal history of other diseases of the circulatory system: Secondary | ICD-10-CM | POA: Insufficient documentation

## 2013-07-31 DIAGNOSIS — J45909 Unspecified asthma, uncomplicated: Secondary | ICD-10-CM | POA: Insufficient documentation

## 2013-07-31 DIAGNOSIS — Z79899 Other long term (current) drug therapy: Secondary | ICD-10-CM | POA: Insufficient documentation

## 2013-07-31 DIAGNOSIS — R079 Chest pain, unspecified: Secondary | ICD-10-CM

## 2013-07-31 DIAGNOSIS — Z8669 Personal history of other diseases of the nervous system and sense organs: Secondary | ICD-10-CM | POA: Insufficient documentation

## 2013-07-31 DIAGNOSIS — Z86711 Personal history of pulmonary embolism: Secondary | ICD-10-CM | POA: Insufficient documentation

## 2013-07-31 DIAGNOSIS — Z8619 Personal history of other infectious and parasitic diseases: Secondary | ICD-10-CM | POA: Insufficient documentation

## 2013-07-31 DIAGNOSIS — Z8659 Personal history of other mental and behavioral disorders: Secondary | ICD-10-CM | POA: Insufficient documentation

## 2013-07-31 DIAGNOSIS — Z87448 Personal history of other diseases of urinary system: Secondary | ICD-10-CM | POA: Insufficient documentation

## 2013-07-31 DIAGNOSIS — Z87440 Personal history of urinary (tract) infections: Secondary | ICD-10-CM | POA: Insufficient documentation

## 2013-07-31 DIAGNOSIS — Z8742 Personal history of other diseases of the female genital tract: Secondary | ICD-10-CM | POA: Insufficient documentation

## 2013-07-31 DIAGNOSIS — R0789 Other chest pain: Secondary | ICD-10-CM | POA: Insufficient documentation

## 2013-07-31 LAB — POCT I-STAT TROPONIN I

## 2013-07-31 LAB — PROTIME-INR
INR: 1 (ref 0.00–1.49)
Prothrombin Time: 13 seconds (ref 11.6–15.2)

## 2013-07-31 LAB — CBC
HCT: 32.9 % — ABNORMAL LOW (ref 36.0–46.0)
Hemoglobin: 10.5 g/dL — ABNORMAL LOW (ref 12.0–15.0)
MCH: 23.7 pg — ABNORMAL LOW (ref 26.0–34.0)
MCHC: 31.9 g/dL (ref 30.0–36.0)

## 2013-07-31 LAB — BASIC METABOLIC PANEL
CO2: 25 mEq/L (ref 19–32)
Calcium: 9.1 mg/dL (ref 8.4–10.5)
Creatinine, Ser: 0.93 mg/dL (ref 0.50–1.10)
GFR calc Af Amer: >90 mL/min (ref >90)
GFR calc non Af Amer: 82 mL/min — ABNORMAL LOW (ref >90)
Sodium: 137 mEq/L (ref 135–145)

## 2013-07-31 MED ORDER — MORPHINE SULFATE 4 MG/ML IJ SOLN
4.0000 mg | Freq: Once | INTRAMUSCULAR | Status: AC
  Administered 2013-07-31: 4 mg via INTRAVENOUS
  Filled 2013-07-31: qty 1

## 2013-07-31 NOTE — ED Notes (Addendum)
Pt brought from work via EMS for intermittent right-sided CP made worse on inspiration, also c/o weakness. CP began Monday and has progressively worsened. Pt reports hx of 3 PE's and states was told she had an Iron level of 8 at her PCP office this Monday. Pt reports bright red blood in stool x1 on Saturday. Per EMS pt's Lungs CTA, NSR

## 2013-07-31 NOTE — ED Notes (Signed)
EDP at bedside  

## 2013-07-31 NOTE — ED Provider Notes (Signed)
CSN: [id number]     Arrival date & time 07/31/13  1938 History   First MD Initiated Contact with Patient 07/31/13 1958     Chief Complaint  Patient presents with  . Chest Pain   (Consider location/radiation/quality/duration/timing/severity/associated sxs/prior Treatment) Patient is a 27 y.o. female presenting with chest pain. The history is provided by the patient.  Chest Pain Pain location:  R chest Pain quality: sharp   Pain radiates to:  R arm Pain radiates to the back: no   Pain severity:  Moderate Onset quality:  Gradual Duration:  5 days Timing:  Intermittent Progression:  Worsening Chronicity:  New Context: breathing and at rest   Relieved by:  None tried Worsened by:  Nothing tried Ineffective treatments:  None tried Associated symptoms: no abdominal pain, no altered mental status, no back pain, no claudication, no cough, no diaphoresis, no dizziness, no fatigue, no fever, no headache, no lower extremity edema, no nausea, no near-syncope, no numbness, no orthopnea, no palpitations, no shortness of breath, no syncope, not vomiting and no weakness   Risk factors: prior DVT/PE (PE x 3)   Risk factors: no coronary artery disease     Past Medical History  Diagnosis Date  . Asthma   . 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 Bipolar II disorder   . Suicidal ideations   . History of blood clots     x3  . Anemia   . Urinary tract infection   . Herpes   . Anxiety   . Eclamptic seizure 2003  . Fibroid   . Genital herpes   . Preeclampsia 2003    with first pregnancy  . Pregnancy induced hypertension 2003    seized 2wks after  . Pyelonephritis 2012  . Seizures 2003    after first delivery  . Headache(784.0)   . Migraines   . History of blood transfusion MARCH 2013  . Depression     doing ok  . Complication of anesthesia 2003    ASPIRATED DURING EMERGENCY [facility]   Past Surgical History   Procedure Laterality Date  . Cesarean section  12/2001 and 01/2008  . Induced abortion  01/2010  . Cesarean section  06/26/2012    Procedure: CESAREAN SECTION;  Surgeon: Willodean Rosenthal, MD;  Location: [facility];  Service: Obstetrics;  Laterality: N/A;   Family History  Problem Relation Age of Onset  . Diabetes Mother     Type 2  . Anemia Sister   . Hypertension Sister   . Anesthesia problems Neg Hx    History  Substance Use Topics  . Smoking status: Never Smoker   . Smokeless tobacco: Never Used  . Alcohol Use: No   OB History   Grav Para Term Preterm Abortions TAB SAB Ect Mult Living   4 3 3  0 1 1 0 0 0 3     Review of Systems  Constitutional: Negative for fever, chills, diaphoresis, appetite change and fatigue.  HENT: Negative for neck pain and neck stiffness.   Respiratory: Negative for cough, chest tightness, shortness of breath and wheezing.   Cardiovascular: Positive for chest pain. Negative for palpitations, orthopnea, claudication, leg swelling, syncope and near-syncope.  Gastrointestinal: Negative for nausea, vomiting, abdominal pain and abdominal distention.  Genitourinary: Negative for dysuria.  Musculoskeletal: Negative for back pain.  Skin: Negative for pallor and rash.  Neurological: Negative for dizziness, syncope, weakness, light-headedness, numbness and  headaches.  All other systems reviewed and are negative.    Allergies  Review of patient's allergies indicates no known allergies.  Home Medications   Current Outpatient Rx  Name  Route  Sig  Dispense  Refill  . acyclovir (ZOVIRAX) 400 MG tablet   Oral   Take 1 tablet (400 mg total) by mouth 2 (two) times daily.   60 tablet   5   . albuterol (VENTOLIN HFA) 108 (90 BASE) MCG/ACT inhaler   Inhalation   Inhale 2 puffs into the lungs every 4 (four) hours as needed for wheezing or shortness of breath. For dyspnea.   1 Inhaler   3   . Rivaroxaban (XARELTO) 15 MG TABS tablet   Oral   Take 1  tablet (15 mg total) by mouth 2 (two) times daily with a meal.   10 tablet   0    BP 121/78  Pulse 83  Temp(Src) 98.1 F (36.7 C) (Oral)  Resp 16  Ht 5\' 3"  (1.6 m)  Wt 170 lb (77.111 kg)  BMI 30.12 kg/m2  SpO2 100%  LMP 07/17/2013  Breastfeeding? No Physical Exam  Nursing note and vitals reviewed. Constitutional: She is oriented to person, place, and time. She appears well-developed and well-nourished. No distress.  HENT:  Head: Normocephalic and atraumatic.  Eyes: EOM are normal. Pupils are equal, round, and reactive to light.  Neck: Normal range of motion. Neck supple.  Cardiovascular: Normal rate, regular rhythm, normal heart sounds and intact distal pulses.   Pulmonary/Chest: Effort normal and breath sounds normal. No respiratory distress. She has no wheezes. She has no rales.  Abdominal: Soft. Bowel sounds are normal. She exhibits no distension. There is no tenderness. There is no rebound and no guarding.  Musculoskeletal: Normal range of motion. She exhibits no edema and no tenderness.  Neurological: She is alert and oriented to person, place, and time. She has normal strength. She exhibits normal muscle tone. Coordination normal. GCS eye subscore is 4. GCS verbal subscore is 5. GCS motor subscore is 6.  Skin: Skin is warm and dry. She is not diaphoretic.    ED Course  Procedures (including critical care time) Labs Review Labs Reviewed  CBC - Abnormal; Notable for the following:    Hemoglobin 10.5 (*)    HCT 32.9 (*)    MCV 74.3 (*)    MCH 23.7 (*)    RDW 17.8 (*)    All other components within normal limits  BASIC METABOLIC PANEL - Abnormal; Notable for the following:    GFR calc non Af Amer 82 (*)    All other components within normal limits  PROTIME-INR  OCCULT BLOOD X 1 CARD TO LAB, STOOL  OCCULT BLOOD, POC DEVICE  POCT I-STAT TROPONIN I  POCT PREGNANCY, URINE   Imaging Review Dg Chest 2 View  07/31/2013   *RADIOLOGY REPORT*  Clinical Data: Chest pain   CHEST - 2 VIEW  Comparison: 03/14/2013CT  Findings: Cardiomediastinal contours within normal range.  Lungs clear.  No pleural effusion or pneumothorax.  No acute osseous finding.  IMPRESSION: No radiographic evidence of acute cardiopulmonary process.   Original Report Authenticated By: Jearld Lesch, M.D.   Ct Angio Chest Pe W/cm &/or Wo Cm  08/01/2013   *RADIOLOGY REPORT*  Clinical Data: Chest pain, history of pulmonary embolism  CT ANGIOGRAPHY CHEST  Technique:  Multidetector CT imaging of the chest using the standard protocol during bolus administration of intravenous contrast. Multiplanar reconstructed images including MIPs  were obtained and reviewed to evaluate the vascular anatomy.  Contrast: OMNIPAQUE IOHEXOL 350 MG/ML SOLN  Comparison: Chest CT - 02/16/2012; 03/15/2011  Vascular Findings:  There is adequate opacification of the pulmonary arterial system with the main pulmonary artery measuring 321 HU.  There are no discrete filling defects within the pulmonary arterial tree to suggest pulmonary embolism.  Normal caliber of the main pulmonary artery.  Normal heart size.  Interval resolution of previously noted trace pericardial effusion.  Normal caliber of the thoracic aorta.  Conventional configuration of the aortic arch.  No definite thoracic aortic dissection or periaortic stranding.  -------------------------------------------------------  Nonvascular findings:  No focal airspace opacities.  Minimal right basilar subpleural atelectasis.  Minimal subsegmental atelectasis in the right middle lobe.  No discrete pulmonary nodules.  Central pulmonary airways are widely patent.  No mediastinal, hilar or axillary lymphadenopathy.  Limited early arterial phase evaluation of the upper abdomen is normal.  No acute or aggressive osseous abnormalities.  The imaged portions of the thyroid are normal.  IMPRESSION: No acute cardiopulmonary disease.  Specifically, no evidence of pulmonary embolism.   Original  Report Authenticated By: Tacey Ruiz, MD     Date: 07/31/2013  Rate: 92  Rhythm: normal sinus rhythm  QRS Axis: normal  Intervals: normal  ST/T Wave abnormalities: nonspecific T wave changes  Conduction Disutrbances:none  Narrative Interpretation:   Old EKG Reviewed: none available   MDM   1. Chest pain    27 y.o. F with a PMH of anemia- requiring blood transfusion 1 year ago- as well as PE x 3 currently on Xarelto, presenting with chest pain.  Pt reports intermittent chest pain over past 5 days, however pain has become constant since yesterday.  Pain is pleuritic in nature, located in R chest with radiation to RUE. She is unsure if symptoms are similar to prior PEs. Denies SOB.  No N/V. She denies lower extremity swelling, pain, or erythema.  Denies recent surgeries or immobilizations.  Pt also states she had 1 episode of painless hematochezia 6 days ago.  She saw her PCP 3 days ago and was found to have a Hb of 8.7.  She reports no further episodes of bloody stools since that time.  On exam, pt AFVSS, no tachycardia, tachypnea, or hypoxia.  CV exam with RRR.  Lungs CTAB.  Abdomen soft, non-tender, non-distended.  No edema, erythema, or tenderness of lower extremities.  Stool hemoccult negative, rectal exam with no acute abnormalities.  EKG shows NSR, no evidence of acute ischemia.  Will check troponin and CXR. CBC to assess for anemia, basic labs, and INR.  Given pleuritic chest pain with pt's h/o 3 prior PEs, will obtain CTA chest to assess for PE.    CXR with no acute abnormalities.  Hb improved to 10.5 today from 8.7.  INR of 1.00.  Troponin negative.  CTA chest negative for PE.  Doubt ACS given 5 days of chest pain with negative troponin and normal EKG.  No significant anemia.  Pt stable for discharge.  Advised close f/u with PCP which she states she can do.  ED return precautions given.  No further questions or concerns.  Discussed with attending Dr. Criss Alvine.   Jodean Lima,  MD 08/01/13 [phone]

## 2013-07-31 NOTE — ED Notes (Signed)
CT notified pt ready for CT.

## 2013-07-31 NOTE — ED Notes (Signed)
Pt difficult IV start, 2 RN attempts. IV team paged

## 2013-07-31 NOTE — ED Notes (Signed)
CT notified of pt having 22g. IV team paged to obtain larger gauge access for CT Angio

## 2013-08-01 ENCOUNTER — Emergency Department (HOSPITAL_COMMUNITY): Payer: BC Managed Care – PPO

## 2013-08-01 ENCOUNTER — Encounter (HOSPITAL_COMMUNITY): Payer: Self-pay | Admitting: Radiology

## 2013-08-01 MED ORDER — IOHEXOL 350 MG/ML SOLN
100.0000 mL | Freq: Once | INTRAVENOUS | Status: AC | PRN
  Administered 2013-08-01: 100 mL via INTRAVENOUS

## 2013-08-01 NOTE — ED Notes (Signed)
Pt comfortable with d/c and f/u instructions. Pt requested information regarding psychiatric support. Pt given paper with list of local resources.

## 2013-08-01 NOTE — ED Provider Notes (Signed)
I saw and evaluated the patient, reviewed the resident's note and I agree with the findings and plan.   Multiple PEs, neg CT for right sided CP here. Not c/w ACS given her presentation. More likely a MSK etiology. Stable for outpatient f/u.  Audree Camel, MD 08/01/13 [phone]

## 2013-08-02 ENCOUNTER — Ambulatory Visit: Payer: BC Managed Care – PPO | Admitting: Family Medicine

## 2013-08-14 ENCOUNTER — Other Ambulatory Visit (HOSPITAL_COMMUNITY)
Admission: RE | Admit: 2013-08-14 | Discharge: 2013-08-14 | Disposition: A | Discharge status: Home / Self Care | Payer: BC Managed Care – PPO | Source: Ambulatory Visit | Attending: Family Medicine | Admitting: Family Medicine

## 2013-08-14 ENCOUNTER — Ambulatory Visit (INDEPENDENT_AMBULATORY_CARE_PROVIDER_SITE_OTHER): Payer: BC Managed Care – PPO | Admitting: Family Medicine

## 2013-08-14 ENCOUNTER — Encounter: Payer: Self-pay | Admitting: Family Medicine

## 2013-08-14 VITALS — BP 115/82 | HR 81 | Temp 98.1°F | Ht 63.0 in | Wt 170.9 lb

## 2013-08-14 DIAGNOSIS — D509 Iron deficiency anemia, unspecified: Secondary | ICD-10-CM

## 2013-08-14 DIAGNOSIS — G43909 Migraine, unspecified, not intractable, without status migrainosus: Secondary | ICD-10-CM

## 2013-08-14 DIAGNOSIS — Z113 Encounter for screening for infections with a predominantly sexual mode of transmission: Secondary | ICD-10-CM | POA: Insufficient documentation

## 2013-08-14 DIAGNOSIS — N898 Other specified noninflammatory disorders of vagina: Secondary | ICD-10-CM

## 2013-08-14 DIAGNOSIS — A6000 Herpesviral infection of urogenital system, unspecified: Secondary | ICD-10-CM

## 2013-08-14 LAB — POCT WET PREP (WET MOUNT): Clue Cells Wet Prep Whiff POC: POSITIVE

## 2013-08-14 MED ORDER — METRONIDAZOLE 500 MG PO TABS: 500.0000 mg | ORAL_TABLET | Freq: Two times a day (BID) | ORAL | Status: DC

## 2013-08-14 MED ORDER — SUMATRIPTAN SUCCINATE 25 MG PO TABS: ORAL_TABLET | ORAL | Status: DC

## 2013-08-14 NOTE — Assessment & Plan Note (Addendum)
Reported persistent fatigue. Currently taking Iron Supplement OTC 325mg  daily. Reviewed recent lab work from ED (07/31/13) Hgb 10.5 with MCV 74.3. Hgb baseline 8-11 (dating back to 2011 per records). No recent or active episodes of bleeding. Continues with Xarelto daily d/to recurrent PE.  Plan: 1. Increase Iron Supplement OTC 325mg  to twice daily with meals, and if tolerated can increase to TID. 2. Strongly encouraged return to clinic within 2 weeks to 1 month for further evaluation of fatigue, determine if improving or worsening.

## 2013-08-14 NOTE — Assessment & Plan Note (Addendum)
Chronic hx of HA, with reported migraines >55yrs. Mixed headaches with both tension HA and migraine HAs. Tension HAs frequent, but resolve with Tylenol, NSAIDs. Migraine HAs occur multiple times weekly, last hours, R > L sided, pulsing aching, +nausea, +photophobia. Not improved by Tylenol, NSAIDs. Only relieved by sleep. Has not tried any prophylactic or abortive therapy in past. Previously told that she could be referred to [facility].  Plan: 1. Start Imitrex 25mg  (#20, 0 refills) at start of migraine, can take additional tablet in 2 hours if not relieved. (MAX 2 per day) 2. If improves, then considered diagnostic as well. Plan to determine need for migraine prophylaxis with nortriptyline, propanolol, or topamax. 3. If not improving, can consider increasing to 50mg  dosing. Otherwise, may try alternative abortive therapy with high dose Ibuprofen at 600mg . Otherwise, can refer to [facility] if persistent HAs.

## 2013-08-14 NOTE — Progress Notes (Signed)
Subjective:     Patient ID: Paula Benson, female   DOB: 01/30/1986, 27 y.o.   MRN: [id number]  HPI  VAGINAL DISCHARGE Reports 1-2 month history of vaginal discharge. Similar to previous episodes BV (last 1 yr ago when pregnant). Tried douching without improvement, feels like it increased discharge. Currently not on birth control, plans to be scheduled for IUD placement. +odor, thin / creamy grey color, without itching or pain. Denies dysuria, bleeding, pelvic pain. No risky sexual behaviors.  MIGRAINE HEADACHE: Chronic hx of HA, with reported migraines >76yrs. Mixed headaches with both tension HA and migraine HAs. Tension HAs frequent, but resolve with Tylenol, NSAIDs.  Migraine HAs occur multiple times weekly, last hours, R > L sided, pulsing aching, +nausea, +photophobia. Not improved by Tylenol, NSAIDs. Only relieved by sleep.  Has not tried any prophylactic or abortive therapy in past. Previously told that she could be referred to [facility].  FATIGUE: Reported persistent fatigue, with history of Iron Deficiency Anemia. Currently taking Iron Supplement OTC 325mg  daily.  Reviewed recent lab work from ED (07/31/13) Hgb 10.5 with MCV 74.3. Hgb baseline 8-11 (dating back to 2011 per records). Denies any recent or active episodes of bleeding. No chest pain, shortness of breath, palpitations.  Continues with Xarelto daily d/to recurrent PE.  GENITAL HERPES: Currently on Acyclovir 400mg  BID for chronic suppression. Denies current outbreak. Expresses concern about frequency of outbreaks every 1-2 weeks, with severe pain causing inability to work during peak of symptoms.  Social Hx: Never smoker Past Medical history: recurrent PE (x3 episodes) on chronic anticoagulation with Xarelto   Review of Systems  See above HPI.    Objective:   Physical Exam  BP 115/82  Pulse 81  Temp(Src) 98.1 F (36.7 C) (Oral)  Ht 5\' 3"  (1.6 m)  Wt 170 lb 14.4 oz (77.52 kg)  BMI 30.28 kg/m2  LMP  07/17/2013  General: very pleasant, conversational, WDWN, NAD HEENT: PERRLA, EOMI Neck: supple, non-tender Heart: RRR, no murmurs Lungs: CTAB Extremities: Non-tender, No edema, +2 peripheral pulses b/l Neuro: awake, alert, oriented, grossly non-focal, normal strength bilaterally, intact distal sensation Pelvic Exam: +thick cream discharge, cervix normal appearing w/o lesions, no bleeding, no cervical motion tenderness. No active herpes lesions noted.

## 2013-08-14 NOTE — Patient Instructions (Addendum)
Dear Paula Benson, Thank you for coming in to clinic today. It was good to finally meet you! Sorry again about the wait today.  Today we discussed your Headaches and Discharge 1. For your migraine headaches, we will try Imitrex, which is an abortive medicine. It should stop your migraine headache, however if it is a tension headache then the tylenol will help more. If this medicine does not seem to be helping, we may need a higher dose, and can possible pursue preventative therapy if it is working. Also, in the future if we are unable to treat your migraines, we can refer to the headache clinic. 2. For your vaginal discharge, the wet prep exam showed that you have Bacterial Vaginosis. Treatment is with Flagyl and you will have 1 refill, incase it comes back before you can be seen again. 3. For your fatigue, please try taking the Iron supplement twice daily with meals. We will discuss this more at your next appointment.  We started a new medication today to help your Migraine Headaches.  Imitrex (Sumatripan) 25mg , please take 1 tablet once your migraine headache starts, you may take a second tablet 2 hours later if the first one does not resolve the headache. Caution you may feel some chest discomfort or pain when you take this, this is typical, but if it persists or gets worse then you may need to be seen or go to the ED.  Please schedule an appointment or call to schedule as soon as possible for an IUD placement in the Procedure Clinic.  Please schedule a follow-up appointment with me in 2 weeks to 1 month. If you are unable to be seen by me, please schedule with another provider to address your Migraine Headaches, and Fatigue.  I will work on your FMLA forms this week, and try to get them completed by the end of the week. Please call clinic with any questions concerning them.  If you have any other questions or concerns, please feel free to call the clinic to contact me. You may also schedule an  earlier appointment if necessary.  However, if your symptoms get significantly worse, please go to the Emergency Department to seek immediate medical attention.  Saralyn Pilar, DO [facility]   Sumatriptan tablets What is this medicine? SUMATRIPTAN (soo ma TRIP tan) is used to treat migraines with or without aura. An aura is a strange feeling or visual disturbance that warns you of an attack. It is not used to prevent migraines. This medicine may be used for other purposes; ask your health care provider or pharmacist if you have questions. What should I tell my health care provider before I take this medicine? They need to know if you have any of these conditions: -bowel disease or colitis -diabetes -family history of heart disease -fast or irregular heart beat -heart or blood vessel disease, angina (chest pain), or previous heart attack -high blood pressure -high cholesterol -history of stroke, transient ischemic attacks (TIAs or mini-strokes), or intracranial bleeding -kidney or liver disease -overweight -poor circulation -postmenopausal or surgical removal of uterus and ovaries -Raynaud's disease -seizure disorder -an unusual or allergic reaction to sumatriptan, other medicines, foods, dyes, or preservatives -pregnant or trying to get pregnant -breast-feeding How should I use this medicine? Take this medicine by mouth with a glass of water. Follow the directions on the prescription label. This medicine is taken at the first symptoms of a migraine. It is not for everyday use. If your migraine  headache returns after one dose, you can take another dose as directed. You must leave at least 2 hours between doses, and do not take more than 100 mg as a single dose. Do not take more than 200 mg total in any 24 hour period. If there is no improvement at all after the first dose, do not take a second dose without talking to your doctor or health care professional. Do  not take your medicine more often than directed. Talk to your pediatrician regarding the use of this medicine in children. Special care may be needed. Overdosage: If you think you have taken too much of this medicine contact a poison control center or emergency room at once. NOTE: This medicine is only for you. Do not share this medicine with others. What if I miss a dose? This does not apply; this medicine is not for regular use. What may interact with this medicine? Do not take this medicine with any of the following medicines: -amphetamine or cocaine -dihydroergotamine, ergotamine, ergoloid mesylates, methysergide, or ergot-type medication - do not take within 24 hours of taking sumatriptan -feverfew -MAOIs like Carbex, Eldepryl, Marplan, Nardil, and Parnate - do not take sumatriptan within 2 weeks of stopping MAOI therapy -other migraine medicines like almotriptan, eletriptan, naratriptan, rizatriptan, zolmitriptan - do not take within 24 hours of taking sumatriptan -tryptophan This medicine may also interact with the following medications: -lithium -medicines for mental depression, anxiety or mood problems -medicines for weight loss such as dexfenfluramine, dextroamphetamine, fenfluramine, or sibutramine -St. John's wort This list may not describe all possible interactions. Give your health care provider a list of all the medicines, herbs, non-prescription drugs, or dietary supplements you use. Also tell them if you smoke, drink alcohol, or use illegal drugs. Some items may interact with your medicine. What should I watch for while using this medicine? Only take this medicine for a migraine headache. Take it if you get warning symptoms or at the start of a migraine attack. It is not for regular use to prevent migraine attacks. You may get drowsy or dizzy. Do not drive, use machinery, or do anything that needs mental alertness until you know how this medicine affects you. To reduce dizzy or  fainting spells, do not sit or stand up quickly, especially if you are an older patient. Alcohol can increase drowsiness, dizziness and flushing. Avoid alcoholic drinks. Smoking cigarettes may increase the risk of heart-related side effects from using this medicine. What side effects may I notice from receiving this medicine? Side effects that you should report to your doctor or health care professional as soon as possible: -allergic reactions like skin rash, itching or hives, swelling of the face, lips, or tongue -breathing problems -changes in vision -chest or throat pain, tightness -fast, slow, or irregular heart beat -hallucinations -increased or decreased blood pressure -problems with balance, talking, walking -seizures -severe stomach pain and cramping, bloody diarrhea -tingling, pain, or numbness in the face, hands or feet Side effects that usually do not require medical attention (report to your doctor or health care professional if they continue or are bothersome): -drowsiness -feeling warm, flushing, or redness of the face -muscle pain or cramps -nausea, vomiting, diarrhea or stomach upset -weak or tired This list may not describe all possible side effects. Call your doctor for medical advice about side effects. You may report side effects to FDA at 1-800-FDA-1088. Where should I keep my medicine? Keep out of the reach of children. Store at room temperature between 2  and 30 degrees C (36 and 86 degrees F). Throw away any unused medicine after the expiration date. NOTE: This sheet is a summary. It may not cover all possible information. If you have questions about this medicine, talk to your doctor, pharmacist, or health care provider.  2013, Elsevier/Gold Standard. (01/28/2008 6:27:14 PM)    Recurrent Migraine Headache A migraine headache is an intense, throbbing pain on one or both sides of your head. Recurrent migraines keep coming back. A migraine can last for 30 minutes  to several hours. CAUSES  The exact cause of a migraine headache is not always known. However, a migraine may be caused when nerves in the brain become irritated and release chemicals that cause inflammation. This causes pain.  SYMPTOMS   Pain on one or both sides of your head.  Pulsating or throbbing pain.  Severe pain that prevents daily activities.  Pain that is aggravated by any physical activity.  Nausea, vomiting, or both.  Dizziness.  Pain with exposure to bright lights, loud noises, or activity.  General sensitivity to bright lights, loud noises, or smells. Before you get a migraine, you may get warning signs that a migraine is coming (aura). An aura may include:  Seeing flashing lights.  Seeing bright spots, halos, or zig-zag lines.  Having tunnel vision or blurred vision.  Having feelings of numbness or tingling.  Having trouble talking.  Having muscle weakness. MIGRAINE TRIGGERS Examples of triggers of migraine headaches include:   Alcohol.  Smoking.  Stress.  Menstruation.  Aged cheeses.  Foods or drinks that contain nitrates, glutamate, aspartame, or tyramine.  Lack of sleep.  Chocolate.  Caffeine.  Hunger.  Physical exertion.  Fatigue.  Medicines used to treat chest pain (nitroglycerine), birth control pills, estrogen, and some blood pressure medicines. DIAGNOSIS  A recurrent migraine headache is often diagnosed based on:  Symptoms.  Physical examination.  A CT scan or MRI of your head. TREATMENT  Medicines may be given for pain and nausea. Medicines can also be given to help prevent recurrent migraines. HOME CARE INSTRUCTIONS  Only take over-the-counter or prescription medicines for pain or discomfort as directed by your caregiver. The use of long-term narcotics is not recommended.  Lie down in a dark, quiet room when you have a migraine.  Keep a journal to find out what may trigger your migraine headaches. For example, write  down:  What you eat and drink.  How much sleep you get.  Any change to your diet or medicines.  Limit alcohol consumption.  Quit smoking if you smoke.  Get 7 to 9 hours of sleep, or as recommended by your caregiver.  Limit stress.  Keep lights dim if bright lights bother you and make your migraines worse. SEEK MEDICAL CARE IF:   You do not get relief from the medicines given to you.  You have a recurrence of pain. SEEK IMMEDIATE MEDICAL CARE IF:  Your migraine becomes severe.  You have a fever.  You have a stiff neck.  You have loss of vision.  You have muscular weakness or loss of muscle control.  You start losing your balance or have trouble walking.  You feel faint or pass out.  You have severe symptoms that are different from your first symptoms. MAKE SURE YOU:   Understand these instructions.  Will watch your condition.  Will get help right away if you are not doing well or get worse. Document Released: 08/16/2001 Document Revised: 02/13/2012 Document Reviewed: 11/11/2011 ExitCare Patient  Information 2014 [employer].  

## 2013-08-14 NOTE — Assessment & Plan Note (Addendum)
Reports 1-2 month history of vaginal discharge. Similar to previous episodes BV (last 1 yr ago when pregnant). +odor, thin / creamy grey color, without itching or pain. Denies dysuria, bleeding, pelvic pain. No risky sexual behaviors. Tried douching without improvement, feels like it increased discharge. Currently not on birth control, plans to be scheduled for IUD placement. Pelvic Exam: +thick cream discharge, cervix normal appearing w/o lesions, no bleeding, no cervical motion tenderness Wet Prep: +Clue cells, +whiff test, consistent with BV GC/Chlam (pending)  Plan: 1. Treat BV with Flagyl 500mg  BID x 7 days, 1 refill to have in reserve if recent recurrence. Patient aware to not drink alcohol. 2. Will notify patient if +GC/Chlamydia and need for treatment. 3. Advised patient to be scheduled for IUD placement in Procedure Clinic. Previously seen 07/16/13 for counseling on birth control, interested in Copper IUD, aware of risks, Upreg test negative at that visit.

## 2013-08-14 NOTE — Assessment & Plan Note (Addendum)
No outbreak of herpes today. Continues chronic suppression with Acyclovir 400mg  BID. Expresses concern due to frequency of outbreaks with recent stress. Episodes every 1-2 weeks. Pain is significantly impairing her ability to work on day of peak symptoms of outbreak. Especially with location near anus, causing difficulty to sit down for prolonged periods at work. Discussed FMLA forms to include intermittent coverage for severe pain from herpes outbreaks.  Plan: 1. Continue Acyclovir chronic suppression

## 2013-08-15 ENCOUNTER — Ambulatory Visit: Payer: BC Managed Care – PPO

## 2013-08-20 ENCOUNTER — Ambulatory Visit: Payer: BC Managed Care – PPO | Admitting: Family Medicine

## 2013-08-21 ENCOUNTER — Telehealth: Payer: Self-pay | Admitting: Family Medicine

## 2013-08-21 NOTE — Telephone Encounter (Signed)
Patient inquires about FMLA paperwork. Advised patient that FMLA paperwork was faxed yesterday. Patients needs paperwork to include date she was in in the ED. (07/31/13).

## 2013-08-21 NOTE — Telephone Encounter (Signed)
Called pt.

## 2013-08-21 NOTE — Telephone Encounter (Signed)
Called pt and she request to put ED visit 07/31/13 on her FMLA forms, they were faxed yesterday. Re-faxed the FMLA form with the added date. Lorenda Hatchet, [name]

## 2013-08-23 ENCOUNTER — Ambulatory Visit: Payer: BC Managed Care – PPO | Admitting: Family Medicine

## 2013-08-23 ENCOUNTER — Ambulatory Visit (INDEPENDENT_AMBULATORY_CARE_PROVIDER_SITE_OTHER): Payer: Self-pay | Admitting: Family Medicine

## 2013-08-23 VITALS — BP 126/85 | HR 74 | Temp 97.1°F | Ht 63.0 in | Wt 167.0 lb

## 2013-08-23 DIAGNOSIS — H6590 Unspecified nonsuppurative otitis media, unspecified ear: Secondary | ICD-10-CM

## 2013-08-23 DIAGNOSIS — H6123 Impacted cerumen, bilateral: Secondary | ICD-10-CM

## 2013-08-23 DIAGNOSIS — H6120 Impacted cerumen, unspecified ear: Secondary | ICD-10-CM

## 2013-08-23 DIAGNOSIS — H6592 Unspecified nonsuppurative otitis media, left ear: Secondary | ICD-10-CM | POA: Insufficient documentation

## 2013-08-23 MED ORDER — FLUCONAZOLE 150 MG PO TABS: 150.0000 mg | ORAL_TABLET | Freq: Once | ORAL | Status: DC

## 2013-08-23 MED ORDER — AMOXICILLIN 875 MG PO TABS: 875.0000 mg | ORAL_TABLET | Freq: Two times a day (BID) | ORAL | Status: DC

## 2013-08-23 NOTE — Patient Instructions (Addendum)
Thank you for coming in, today!  I think part of your hearing difficulty is due to excess wax in your ears. It looks like your left ear may also be infected. It might be a virus, but it is probably worth giving you an antibiotic. I want you to take Amoxicillin, one pill twice a day, for 10 days. To clean your ears, put a drop or two of white vinegar mixed equally with water in them, then take a shower and let the water rinse them out. Never put ANYTHING into your ear smaller than a washcloth covering your fingertip. Using Q-tips down into your ear canals can pack wax down into your ears and affect your hearing or cause infections.  Make an appointment to see Dr. Althea Charon in the next 3-4 weeks. You can talk to him about your headaches and he can follow up your hearing. Please feel free to call with any questions or concerns at any time, at [phone]. --Dr. Casper Harrison

## 2013-08-23 NOTE — Progress Notes (Signed)
  Subjective:    Patient ID: Paula Benson, female    DOB: Oct 30, 1986, 27 y.o.   MRN: [id number]  HPI: Pt presents to clinic with complaint of being unable to hear in her left ear for 3 days. -pt was seen a few weeks ago for cerumen impaction but was unable to afford ear drops  -pt had episode of being unable to hear from both ears for 1.5 hours after showering  -resolved on its own, but now has similar but persistent sensation in the left ear -now has similar symptoms of left ear, but persistent, with some vaguely-described "sharp pain" in her ear -yesterday used peroxide drops in ears with no help, ?made worse -admits to using Q-tips to clean out her ears -denies injuries, falls, fever, vomiting; describes sensation of feeling "like her equilibrium is off" -does endorse chills (not new/has them often), nausea (?with Flagyl for recently-diagnosed BV), otherwise feels well  Review of Systems: as above. Some complaints of headache 'like her normal migraines,' but 'not bad and not often.'     Objective:   Physical Exam BP 126/85  Pulse 74  Temp(Src) 97.1 F (36.2 C) (Oral)  Ht 5\' 3"  (1.6 m)  Wt 167 lb (75.751 kg)  BMI 29.59 kg/m2  LMP 07/17/2013  Gen: well-appearing adult female in NAD HEENT: bilateral TM's initially not visible due to hard, dark cerumen deep in canals  TM's visible after canal lavage --> left better visualized than right, TM with some effusion and mild redness  Canals after lavage have some skin peeling/excoriation  Mild frontal and maxillary sinus tenderness  Otherwise [place]/AT, PERRLA, EOMI, post oropharynx mildly inflamed; uvula ?adhered to right tonsil, but no exudate Cardio: RRR, no murmur Pulm: CTAB, no wheezes Ext: warm, well-perfused, no LE edema Neuro: balance/gait intact, strength 5/5 in both upper and both lower extremities, sensation grossly intact     Assessment & Plan:

## 2013-08-25 NOTE — Assessment & Plan Note (Addendum)
Bilateral, likely contributing to symptoms of ear pain, changes in hearing, and subjective sensation of disrupted equilibrium. Ear canals lavaged in clinic today, left TM appears mildly red with some effusion, and right canal not completely cleared of cerumen. Rx for amoxicillin for 10 days. Advised to stop using Q-tips to clean ears, to use peroxide, white vinegar mixed 1:1 with water, or OTC ear drops such as Colace. F/u with PCP Dr. Althea Charon as needed.

## 2013-08-25 NOTE — Assessment & Plan Note (Signed)
Rx for amoxicillin for 10 days. F/u as needed.

## 2013-10-10 ENCOUNTER — Other Ambulatory Visit: Payer: Self-pay

## 2013-10-17 ENCOUNTER — Telehealth: Payer: Self-pay | Admitting: Family Medicine

## 2013-10-17 NOTE — Telephone Encounter (Signed)
Pt says the pill (flagyl) is not working any more. She would like to have the cream called in. Pharmacy is [facility]. She is having a "crisis"

## 2013-10-18 MED ORDER — NYSTATIN-TRIAMCINOLONE 100000-0.1 UNIT/GM-% EX OINT: 1.0000 "application " | TOPICAL_OINTMENT | Freq: Two times a day (BID) | CUTANEOUS | Status: DC

## 2013-10-18 NOTE — Telephone Encounter (Signed)
Sent in rx for Mycolog ointment, previously treated with Diflucan.

## 2013-10-18 NOTE — Telephone Encounter (Signed)
Will fwd to Md.  Damarious Holtsclaw L, CMA  

## 2013-10-18 NOTE — Addendum Note (Signed)
Addended by: Smitty Cords on: 10/18/2013 04:34 PM   Modules accepted: Orders, Medications

## 2013-11-06 ENCOUNTER — Ambulatory Visit (INDEPENDENT_AMBULATORY_CARE_PROVIDER_SITE_OTHER): Payer: BC Managed Care – PPO | Admitting: Family Medicine

## 2013-11-06 VITALS — BP 116/78 | HR 84 | Temp 98.3°F | Ht 63.0 in | Wt 172.0 lb

## 2013-11-06 DIAGNOSIS — B9689 Other specified bacterial agents as the cause of diseases classified elsewhere: Secondary | ICD-10-CM | POA: Insufficient documentation

## 2013-11-06 DIAGNOSIS — A6000 Herpesviral infection of urogenital system, unspecified: Secondary | ICD-10-CM

## 2013-11-06 DIAGNOSIS — J029 Acute pharyngitis, unspecified: Secondary | ICD-10-CM

## 2013-11-06 LAB — POCT RAPID STREP A (OFFICE): Rapid Strep A Screen: NEGATIVE

## 2013-11-06 MED ORDER — ACYCLOVIR 400 MG PO TABS: 800.0000 mg | ORAL_TABLET | Freq: Two times a day (BID) | ORAL | Status: DC

## 2013-11-06 MED ORDER — AMOXICILLIN-POT CLAVULANATE 875-125 MG PO TABS: 1.0000 | ORAL_TABLET | Freq: Two times a day (BID) | ORAL | Status: DC

## 2013-11-06 NOTE — Patient Instructions (Signed)
Viral and Bacterial Pharyngitis  Pharyngitis is a sore throat. It is an infection of the back of the throat (pharynx).  HOME CARE    Only take medicine as told by your doctor. You may get sick again if you do not take medicine as told.   Drink enough fluids to keep your pee (urine) clear or pale yellow.   Rest.   Rinse your mouth (gargle) with salt water ( teaspoon of salt in 8 ounces of water) every 1 to 2 hours. This will help the pain.   For children over the age of 7, suck on hard candy or sore throat lozenges.  GET HELP RIGHT AWAY IF:    There are large, tender lumps in your neck.   You have a rash.   You cough up green, yellow-brown, or bloody mucus.   You have a stiff neck.   There is redness, puffiness (swelling), or very bad pain anywhere on the neck.   You drool or are unable to swallow liquids.   You throw up (vomit) or are not able to keep medicine or liquids down.   You have very bad pain that will not stop with medicine.   You have problems breathing (not from a stuffy nose).   You cannot open your mouth completely.   You or your child has a temperature by mouth above 102 F (38.9 C), not controlled by medicine.   Your baby is older than 3 months with a rectal temperature of 102 F (38.9 C) or higher.   Your baby is 3 months old or younger with a rectal temperature of 100.4 F (38 C) or higher.  MAKE SURE YOU:    Understand these instructions.   Will watch this condition.   Will get help right away if you or your child is not doing well or gets worse.  Document Released: 05/09/2008 Document Revised: 02/13/2012 Document Reviewed: 12/21/2009  ExitCare Patient Information 2014 [employer].

## 2013-11-06 NOTE — Assessment & Plan Note (Signed)
Likely bacterial pharyngitis as the cause of her sore throat and an exam. Rapid strep is negative today Treat with Augmentin course for 5 days Red flags given followup with PCP as needed

## 2013-11-06 NOTE — Progress Notes (Signed)
   Subjective:    Patient ID: Elvera Lennox, female    DOB: 1986-04-20, 27 y.o.   MRN: [id number]  HPI  Pharyngitis: Patient states she had a cold (runny nose, congestion, sneezing)  the weekend before last, then progressed to sore throat (this week)  and right ear pain (Saturday). She denies nausea, vomit, diarrhea, fever or stomach upset. She endorsed headaches during this time. No sick contacts.   HSV: Reports she is having an outbreak and does not have medications. She is requesting the generic form. She states she has outbreaks every 2 weeks approximately even on suppression therapy Review of Systems Negative, with the exception of above mentioned in HPI    Objective:   Physical Exam BP 116/78  Pulse 84  Temp(Src) 98.3 F (36.8 C) (Oral)  Ht 5\' 3"  (1.6 m)  Wt 172 lb (78.019 kg)  BMI 30.48 kg/m2 Gen: NAD.  HEENT: AT. [place]. Bilateral TM not visualized d/t cerumen impaction. Bilateral eyes without injections or icterus. MMM. Bilateral nares without erythema. Throat with erythema and exudates.  CV: RRR  Chest: CTAB, no wheeze or crackles Abd: Soft.  NTND. BS positive. No HSM

## 2013-11-06 NOTE — Assessment & Plan Note (Addendum)
Patient is currently having an outbreak of herpes. We will prescribe a cycle of your 800 mg 2 times a day for 2 days. She then we'll continue to go to her 400 mg 2 times a day. Patient may need how of increased dose for suppression considering she's having so many outbreaks. She also states she consistently has a BB infection and that she does not seem to be able to treat. 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

## 2014-02-05 ENCOUNTER — Encounter: Payer: Self-pay | Admitting: Family Medicine

## 2014-02-05 ENCOUNTER — Ambulatory Visit (INDEPENDENT_AMBULATORY_CARE_PROVIDER_SITE_OTHER): Payer: Self-pay | Admitting: Family Medicine

## 2014-02-05 VITALS — BP 124/77 | HR 79 | Temp 98.6°F | Ht 63.0 in | Wt 163.0 lb

## 2014-02-05 DIAGNOSIS — Z86711 Personal history of pulmonary embolism: Secondary | ICD-10-CM

## 2014-02-05 DIAGNOSIS — M542 Cervicalgia: Secondary | ICD-10-CM

## 2014-02-05 NOTE — Assessment & Plan Note (Signed)
Not currently on prophylaxis.  Strongly encouraged to follow up with Dr Raliegh Ip asap and bring in all her medications. Has appointment in less than a week

## 2014-02-05 NOTE — Progress Notes (Signed)
   Subjective:    Patient ID: Paula Benson, female    DOB: 1986-05-05, 28 y.o.   MRN: [id number]  HPI  Neck and Swallowing Pain Started slowly yesterday with feeling of something in throat discomfort when swallowing tender are on external anterior neck.  Was not eating or swallowing at ht time.  No shortness of breath or tongue swelling or food sticking.  No fever or chills or URI symptoms   Pulmonary emboli Multiple in past.  Stopped taking Xarelto several weeks ago - ran out of samples she thinks (not sure may have more at home)  Also thinks may have Warfarin at home.  No shortness of breath or leg sweling currenlty  Review of Symptoms - see HPI  PMH - Smoking status noted.      Review of Systems     Objective:   Physical Exam  Alert no acute distress Neck - tender focally just medial to middle of SCM.  No soft tissue swelling or fluctuance noted.  Pain worse with resisted neck rotation.  Thyroid normal size and not diffusely tender.  Mouth - no lesions, mucous membranes are moist, no decaying teeth  Throat: normal mucosa, no exudate, uvula midline, no redness, no swelling, Swallows normally Lungs:  Normal respiratory effort, chest expands symmetrically. Lungs are clear to auscultation, no crackles or wheezes.       Assessment & Plan:

## 2014-02-05 NOTE — Patient Instructions (Signed)
Good to see you today!  Thanks for coming in.  I think you have a neck muscle strain - Would use heat on the area three times daily for 0-20 minutes and take ibuprofen 3 tablets up to three times daily a day for pain.   Do not eat or drink very cold stuff  If not better in 2-3 weeks or if worsening or trouble breathing then come back  See Dr Raliegh Ip as soon as you can to discuss anticoagulation for the pulmonary emboli  If you have Xarelto start taking it again as it was prescribed.  If not the bring in all your medication bottles to see Dr Raliegh Ip

## 2014-02-05 NOTE — Assessment & Plan Note (Signed)
Seems to be musculoskeletal related.  Normal throat exam and no edema concerns.  No history of foreign body.  Does not seem to be thyroid related.  Treat symptomatically and follow up closely

## 2014-02-10 ENCOUNTER — Ambulatory Visit: Payer: Self-pay | Admitting: Family Medicine

## 2014-07-25 ENCOUNTER — Telehealth: Payer: Self-pay | Admitting: Family Medicine

## 2014-07-25 MED ORDER — ACYCLOVIR 200 MG PO CAPS: 400.0000 mg | ORAL_CAPSULE | Freq: Two times a day (BID) | ORAL | Status: DC

## 2014-07-25 NOTE — Telephone Encounter (Signed)
Send in rx for Acyclovir 200mg  tabs take 400mg  (2 tabs) BID (#120 for 1 month supply, 2 refills).  Could you please return call and ask patient to schedule follow-up at her convenience anytime in the next 1-3 months. Thank you.  Nobie Putnam, [place], PGY-2

## 2014-07-25 NOTE — Telephone Encounter (Signed)
Pt called and would like a refill on her Zovirax 200 mg called in. She said that if she has to have an appointment please call and let her know, but Dr. Parks Ranger doesn't have anything until September so could we send in at least a month worth until see can be seen. Please call patient and let her know when it is called and to let her if she has to have an appointment later. jw

## 2014-10-06 ENCOUNTER — Encounter: Payer: Self-pay | Admitting: Family Medicine

## 2014-10-29 ENCOUNTER — Emergency Department (HOSPITAL_COMMUNITY)
Admission: EM | Admit: 2014-10-29 | Discharge: 2014-10-30 | Disposition: A | Discharge status: Home / Self Care | Payer: Self-pay | Attending: Emergency Medicine | Admitting: Emergency Medicine

## 2014-10-29 ENCOUNTER — Encounter (HOSPITAL_COMMUNITY): Payer: Self-pay | Admitting: Emergency Medicine

## 2014-10-29 ENCOUNTER — Emergency Department (HOSPITAL_COMMUNITY): Payer: Self-pay

## 2014-10-29 DIAGNOSIS — R0789 Other chest pain: Secondary | ICD-10-CM | POA: Insufficient documentation

## 2014-10-29 DIAGNOSIS — Z86711 Personal history of pulmonary embolism: Secondary | ICD-10-CM | POA: Insufficient documentation

## 2014-10-29 DIAGNOSIS — Z8619 Personal history of other infectious and parasitic diseases: Secondary | ICD-10-CM | POA: Insufficient documentation

## 2014-10-29 DIAGNOSIS — Z87440 Personal history of urinary (tract) infections: Secondary | ICD-10-CM | POA: Insufficient documentation

## 2014-10-29 DIAGNOSIS — Z3202 Encounter for pregnancy test, result negative: Secondary | ICD-10-CM | POA: Insufficient documentation

## 2014-10-29 DIAGNOSIS — Z8759 Personal history of other complications of pregnancy, childbirth and the puerperium: Secondary | ICD-10-CM | POA: Insufficient documentation

## 2014-10-29 DIAGNOSIS — Z79899 Other long term (current) drug therapy: Secondary | ICD-10-CM | POA: Insufficient documentation

## 2014-10-29 DIAGNOSIS — Z8659 Personal history of other mental and behavioral disorders: Secondary | ICD-10-CM | POA: Insufficient documentation

## 2014-10-29 DIAGNOSIS — Z862 Personal history of diseases of the blood and blood-forming organs and certain disorders involving the immune mechanism: Secondary | ICD-10-CM | POA: Insufficient documentation

## 2014-10-29 DIAGNOSIS — J45909 Unspecified asthma, uncomplicated: Secondary | ICD-10-CM | POA: Insufficient documentation

## 2014-10-29 DIAGNOSIS — G43909 Migraine, unspecified, not intractable, without status migrainosus: Secondary | ICD-10-CM | POA: Insufficient documentation

## 2014-10-29 DIAGNOSIS — R000 Tachycardia, unspecified: Secondary | ICD-10-CM | POA: Insufficient documentation

## 2014-10-29 DIAGNOSIS — N12 Tubulo-interstitial nephritis, not specified as acute or chronic: Secondary | ICD-10-CM | POA: Insufficient documentation

## 2014-10-29 DIAGNOSIS — Z8742 Personal history of other diseases of the female genital tract: Secondary | ICD-10-CM | POA: Insufficient documentation

## 2014-10-29 LAB — URINALYSIS, ROUTINE W REFLEX MICROSCOPIC
Bilirubin Urine: NEGATIVE
GLUCOSE, UA: NEGATIVE mg/dL
KETONES UR: 15 mg/dL — AB
Nitrite: POSITIVE — AB
PH: 6 (ref 5.0–8.0)
PROTEIN: NEGATIVE mg/dL
Specific Gravity, Urine: 1.012 (ref 1.005–1.030)
Urobilinogen, UA: 0.2 mg/dL (ref 0.0–1.0)

## 2014-10-29 LAB — COMPREHENSIVE METABOLIC PANEL
ALT: 9 U/L (ref 0–35)
AST: 19 U/L (ref 0–37)
Albumin: 3.9 g/dL (ref 3.5–5.2)
Alkaline Phosphatase: 54 U/L (ref 39–117)
Anion gap: 18 — ABNORMAL HIGH (ref 5–15)
BUN: 10 mg/dL (ref 6–23)
CO2: 18 meq/L — AB (ref 19–32)
CREATININE: 0.86 mg/dL (ref 0.50–1.10)
Calcium: 9.5 mg/dL (ref 8.4–10.5)
Chloride: 96 mEq/L (ref 96–112)
GFR, EST NON AFRICAN AMERICAN: 90 mL/min — AB (ref >90)
GLUCOSE: 92 mg/dL (ref 70–99)
Potassium: 3.7 mEq/L (ref 3.7–5.3)
SODIUM: 132 meq/L — AB (ref 137–147)
Total Bilirubin: 0.9 mg/dL (ref 0.3–1.2)
Total Protein: 7.8 g/dL (ref 6.0–8.3)

## 2014-10-29 LAB — CBC WITH DIFFERENTIAL/PLATELET
BASOS ABS: 0 10*3/uL (ref 0.0–0.1)
BASOS PCT: 0 % (ref 0–1)
Eosinophils Absolute: 0 10*3/uL (ref 0.0–0.7)
Eosinophils Relative: 0 % (ref 0–5)
HEMATOCRIT: 27.8 % — AB (ref 36.0–46.0)
HEMOGLOBIN: 8.3 g/dL — AB (ref 12.0–15.0)
LYMPHS ABS: 1 10*3/uL (ref 0.7–4.0)
Lymphocytes Relative: 7 % — ABNORMAL LOW (ref 12–46)
MCH: 20.4 pg — AB (ref 26.0–34.0)
MCHC: 29.9 g/dL — AB (ref 30.0–36.0)
MCV: 68.5 fL — ABNORMAL LOW (ref 78.0–100.0)
Monocytes Absolute: 1.2 10*3/uL — ABNORMAL HIGH (ref 0.1–1.0)
Monocytes Relative: 9 % (ref 3–12)
NEUTROS ABS: 11.5 10*3/uL — AB (ref 1.7–7.7)
Neutrophils Relative %: 84 % — ABNORMAL HIGH (ref 43–77)
Platelets: 334 10*3/uL (ref 150–400)
RBC: 4.06 MIL/uL (ref 3.87–5.11)
RDW: 18.3 % — ABNORMAL HIGH (ref 11.5–15.5)
WBC: 13.7 10*3/uL — ABNORMAL HIGH (ref 4.0–10.5)

## 2014-10-29 LAB — PREGNANCY, URINE: Preg Test, Ur: NEGATIVE

## 2014-10-29 LAB — URINE MICROSCOPIC-ADD ON

## 2014-10-29 MED ORDER — SODIUM CHLORIDE 0.9 % IV BOLUS (SEPSIS)
2000.0000 mL | Freq: Once | INTRAVENOUS | Status: AC
  Administered 2014-10-29: 2000 mL via INTRAVENOUS

## 2014-10-29 MED ORDER — CEPHALEXIN 500 MG PO CAPS: 500.0000 mg | ORAL_CAPSULE | Freq: Three times a day (TID) | ORAL | Status: DC

## 2014-10-29 MED ORDER — MORPHINE SULFATE 4 MG/ML IJ SOLN
4.0000 mg | Freq: Once | INTRAMUSCULAR | Status: AC
  Administered 2014-10-29: 4 mg via INTRAVENOUS
  Filled 2014-10-29: qty 1

## 2014-10-29 MED ORDER — ONDANSETRON HCL 4 MG/2ML IJ SOLN
4.0000 mg | Freq: Once | INTRAMUSCULAR | Status: AC
  Administered 2014-10-29: 4 mg via INTRAVENOUS
  Filled 2014-10-29: qty 2

## 2014-10-29 MED ORDER — ACETAMINOPHEN 325 MG PO TABS: 650.0000 mg | ORAL_TABLET | Freq: Four times a day (QID) | ORAL | Status: DC | PRN

## 2014-10-29 MED ORDER — HYDROCODONE-ACETAMINOPHEN 5-325 MG PO TABS: 1.0000 | ORAL_TABLET | Freq: Four times a day (QID) | ORAL | Status: DC | PRN

## 2014-10-29 MED ORDER — CEFTRIAXONE SODIUM 1 G IJ SOLR
1.0000 g | Freq: Once | INTRAMUSCULAR | Status: AC
  Administered 2014-10-29: 1 g via INTRAVENOUS
  Filled 2014-10-29: qty 10

## 2014-10-29 MED ORDER — HYDROCODONE-ACETAMINOPHEN 5-325 MG PO TABS
1.0000 | ORAL_TABLET | Freq: Once | ORAL | Status: AC
  Administered 2014-10-29: 1 via ORAL
  Filled 2014-10-29: qty 1

## 2014-10-29 MED ORDER — ACETAMINOPHEN 325 MG PO TABS
650.0000 mg | ORAL_TABLET | Freq: Four times a day (QID) | ORAL | Status: DC | PRN
  Administered 2014-10-29: 650 mg via ORAL
  Filled 2014-10-29: qty 2

## 2014-10-29 NOTE — Discharge Instructions (Signed)
Pyelonephritis, Adult °Pyelonephritis is a kidney infection. In general, there are 2 main types of pyelonephritis: °· Infections that come on quickly without any warning (acute pyelonephritis). °· Infections that persist for a long period of time (chronic pyelonephritis). °CAUSES  °Two main causes of pyelonephritis are: °· Bacteria traveling from the bladder to the kidney. This is a problem especially in pregnant women. The urine in the bladder can become filled with bacteria from multiple causes, including: °¨ Inflammation of the prostate gland (prostatitis). °¨ Sexual intercourse in females. °¨ Bladder infection (cystitis). °· Bacteria traveling from the bloodstream to the tissue part of the kidney. °Problems that may increase your risk of getting a kidney infection include: °· Diabetes. °· Kidney stones or bladder stones. °· Cancer. °· Catheters placed in the bladder. °· Other abnormalities of the kidney or ureter. °SYMPTOMS  °· Abdominal pain. °· Pain in the side or flank area. °· Fever. °· Chills. °· Upset stomach. °· Blood in the urine (dark urine). °· Frequent urination. °· Strong or persistent urge to urinate. °· Burning or stinging when urinating. °DIAGNOSIS  °Your caregiver may diagnose your kidney infection based on your symptoms. A urine sample may also be taken. °TREATMENT  °In general, treatment depends on how severe the infection is.  °· If the infection is mild and caught early, your caregiver may treat you with oral antibiotics and send you home. °· If the infection is more severe, the bacteria may have gotten into the bloodstream. This will require intravenous (IV) antibiotics and a hospital stay. Symptoms may include: °¨ High fever. °¨ Severe flank pain. °¨ Shaking chills. °· Even after a hospital stay, your caregiver may require you to be on oral antibiotics for a period of time. °· Other treatments may be required depending upon the cause of the infection. °HOME CARE INSTRUCTIONS  °· Take your  antibiotics as directed. Finish them even if you start to feel better. °· Make an appointment to have your urine checked to make sure the infection is gone. °· Drink enough fluids to keep your urine clear or pale yellow. °· Take medicines for the bladder if you have urgency and frequency of urination as directed by your caregiver. °SEEK IMMEDIATE MEDICAL CARE IF:  °· You have a fever or persistent symptoms for more than 2-3 days. °· You have a fever and your symptoms suddenly get worse. °· You are unable to take your antibiotics or fluids. °· You develop shaking chills. °· You experience extreme weakness or fainting. °· There is no improvement after 2 days of treatment. °MAKE SURE YOU: °· Understand these instructions. °· Will watch your condition. °· Will get help right away if you are not doing well or get worse. °Document Released: 11/21/2005 Document Revised: 05/22/2012 Document Reviewed: 04/27/2011 °ExitCare® Patient Information ©2015 [employer]. This information is not intended to replace advice given to you by your health care provider. Make sure you discuss any questions you have with your health care provider. ° °

## 2014-10-29 NOTE — ED Provider Notes (Signed)
CSN: [id number]     Arrival date & time 10/29/14  1943 History   First MD Initiated Contact with Patient 10/29/14 2003     Chief Complaint  Patient presents with  . Chest Pain  . Shortness of Breath  . Fever     (Consider location/radiation/quality/duration/timing/severity/associated sxs/prior Treatment) HPI  28 year old female presents with fever, back pain, myalgias, and overall not feeling well since this morning. She woke up feeling well and when she went to work couple hours later noticed all of these symptoms. She says the back pain feels like it's on her bilateral flanks and feels like prior kidney infections. She has had intermittent chest tightness but denies shortness of breath or cough. No nasal congestion. This chest tightness does not feel similar at all to her prior PEs. The patient is currently on Xarelto for PEs. She's not feel any weakness or numbness in her legs. No incontinence. She has had intermittent burning when urinating over the last couple weeks but does not happen with every urination.  Past Medical History  Diagnosis Date  . Asthma   . 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 Bipolar II disorder   . Suicidal ideations   . History of blood clots     x3  . Anemia   . Urinary tract infection   . Herpes   . Anxiety   . Eclamptic seizure 2003  . Fibroid   . Genital herpes   . Preeclampsia 2003    with first pregnancy  . Pregnancy induced hypertension 2003    seized 2wks after  . Pyelonephritis 2012  . Seizures 2003    after first delivery  . Headache(784.0)   . Migraines   . History of blood transfusion MARCH 2013  . Depression     doing ok  . Complication of anesthesia 2003    ASPIRATED DURING EMERGENCY CS   Past Surgical History  Procedure Laterality Date  . Cesarean section  12/2001 and 01/2008  . Induced abortion  01/2010  . Cesarean section  06/26/2012    Procedure:  CESAREAN SECTION;  Surgeon: Lavonia Drafts, MD;  Location: [facility];  Service: Obstetrics;  Laterality: N/A;   Family History  Problem Relation Age of Onset  . Diabetes Mother     Type 2  . Anemia Sister   . Hypertension Sister   . Anesthesia problems Neg Hx    History  Substance Use Topics  . Smoking status: Never Smoker   . Smokeless tobacco: Never Used  . Alcohol Use: No   OB History    Gravida Para Term Preterm AB TAB SAB Ectopic Multiple Living   4 3 3  0 1 1 0 0 0 3     Review of Systems  Constitutional: Positive for fever and chills.  Respiratory: Positive for chest tightness. Negative for cough and shortness of breath.   Gastrointestinal: Negative for nausea, vomiting and abdominal pain.  Genitourinary: Positive for dysuria.  Musculoskeletal: Positive for back pain.  All other systems reviewed and are negative.     Allergies  Review of patient's allergies indicates no known allergies.  Home Medications   Prior to Admission medications   Medication Sig Start Date End Date Taking? Authorizing Provider  acyclovir (ZOVIRAX) 200 MG capsule Take 2 capsules (400 mg total) by mouth 2 (two) times daily. 07/25/14   Nobie Putnam, DO  albuterol (VENTOLIN HFA) 108 (90  BASE) MCG/ACT inhaler Inhale 2 puffs into the lungs every 4 (four) hours as needed for wheezing or shortness of breath. For dyspnea. 06/29/12   Truett Mainland, DO  Rivaroxaban (XARELTO) 15 MG TABS tablet Take 1 tablet (15 mg total) by mouth 2 (two) times daily with a meal. 07/16/13   Lupita Dawn, MD  SUMAtriptan (IMITREX) 25 MG tablet Take 1 tablet at start of migraine headache. If it is not resolved, you may take a 2nd tablet 2 hours later. 08/14/13   Alexander Karamalegos, DO   BP 116/61 mmHg  Pulse 111  Temp(Src) 101.2 F (38.4 C) (Oral)  Resp 23  SpO2 97%  LMP 10/15/2014 Physical Exam  Constitutional: She is oriented to person, place, and time. She appears well-developed and  well-nourished.  HENT:  Head: Normocephalic and atraumatic.  Right Ear: External ear normal.  Left Ear: External ear normal.  Nose: Nose normal.  Eyes: Right eye exhibits no discharge. Left eye exhibits no discharge.  Cardiovascular: Regular rhythm and normal heart sounds.  Tachycardia present.   Pulmonary/Chest: Effort normal and breath sounds normal. She has no wheezes. She has no rales.  Abdominal: Soft. She exhibits no distension. There is no tenderness. There is CVA tenderness (bilateral).  Musculoskeletal:  No midline back tenderness  Neurological: She is alert and oriented to person, place, and time.  Reflex Scores:      Patellar reflexes are 2+ on the right side and 2+ on the left side.      Achilles reflexes are 2+ on the right side and 2+ on the left side. Normal strength in bilateral lower extremities  Skin: Skin is warm and dry.  Nursing note and vitals reviewed.   ED Course  Procedures (including critical care time) Labs Review Labs Reviewed  URINALYSIS, ROUTINE W REFLEX MICROSCOPIC - Abnormal; Notable for the following:    APPearance CLOUDY (*)    Hgb urine dipstick TRACE (*)    Ketones, ur 15 (*)    Nitrite POSITIVE (*)    Leukocytes, UA LARGE (*)    All other components within normal limits  COMPREHENSIVE METABOLIC PANEL - Abnormal; Notable for the following:    Sodium 132 (*)    CO2 18 (*)    GFR calc non Af Amer 90 (*)    Anion gap 18 (*)    All other components within normal limits  CBC WITH DIFFERENTIAL - Abnormal; Notable for the following:    WBC 13.7 (*)    Hemoglobin 8.3 (*)    HCT 27.8 (*)    MCV 68.5 (*)    MCH 20.4 (*)    MCHC 29.9 (*)    RDW 18.3 (*)    Neutrophils Relative % 84 (*)    Lymphocytes Relative 7 (*)    Neutro Abs 11.5 (*)    Monocytes Absolute 1.2 (*)    All other components within normal limits  URINE MICROSCOPIC-ADD ON - Abnormal; Notable for the following:    Squamous Epithelial / LPF FEW (*)    Bacteria, UA MANY (*)     All other components within normal limits  URINE CULTURE  PREGNANCY, URINE  CBC  POC URINE PREG, ED  I-STAT CG4 LACTIC ACID, ED  I-STAT TROPOININ, ED    Imaging Review Dg Chest Port 1 View  (if Code Sepsis Called)  10/29/2014   CLINICAL DATA:  Fevers.  Chill and chest pain.  Some SOB.  EXAM: PORTABLE CHEST - 1 VIEW  COMPARISON:  07/31/2013.  FINDINGS: The heart size and mediastinal contours are within normal limits. Both lungs are clear. The visualized skeletal structures are unremarkable.  IMPRESSION: No active disease.   Electronically Signed   By: Kerby Moors M.D.   On: 10/29/2014 20:31     EKG Interpretation   Date/Time:  Wednesday October 29 2014 19:51:18 EST Ventricular Rate:  126 PR Interval:  148 QRS Duration: 76 QT Interval:  402 QTC Calculation: 582 R Axis:   68 Text Interpretation:  Sinus tachycardia Prolonged QT interval nonspecific  T wave abnormalities No significant change since 2014 Confirmed by  Landis  MD, [place] ([id number]) on 10/29/2014 10:00:00 PM      MDM   Final diagnoses:  Pyelonephritis    Patient's workup is consistent with Pyelonephritis. Her tachycardia significantly improved with tylenol and fluids. Given rocephin x 1 in ED. Given the pyelo, I have low suspicion for other acute causes of fever such as PNA, discitis or epidural abscess. No abdominal tenderness. She is well appearing here and has no vomiting or uncontrolled pain. Will thus try outpatient treatment of pyelo. Will give keflex given potential drug interactions with warfarin and cipro or bactrim.    Ephraim Hamburger, MD 10/29/14 [phone]

## 2014-10-29 NOTE — ED Notes (Addendum)
Per EMS pt was picked up from work and has been running a fever since this am 102.3 with chills and chest pain with some SOB. Has generalized body aches. No flu shot this year. Hx of PE and is on Warfarin. EMS NSR, BP 118/64, Pulse 130 and O2 100% room air. Pt also reports lower back pain with some pain on urination.

## 2014-10-29 NOTE — ED Notes (Signed)
Bedside report received from previous RN, [facility].

## 2014-10-30 MED ORDER — FLUCONAZOLE 150 MG PO TABS: 150.0000 mg | ORAL_TABLET | Freq: Every day | ORAL | Status: DC

## 2014-10-31 ENCOUNTER — Encounter (HOSPITAL_COMMUNITY): Payer: Self-pay

## 2014-10-31 ENCOUNTER — Inpatient Hospital Stay (HOSPITAL_COMMUNITY)
Admission: EM | Admit: 2014-10-31 | Discharge: 2014-11-06 | DRG: 689 | Disposition: A | Discharge status: Home / Self Care | Payer: Self-pay | Attending: Internal Medicine | Admitting: Internal Medicine

## 2014-10-31 ENCOUNTER — Inpatient Hospital Stay (HOSPITAL_COMMUNITY): Payer: Self-pay

## 2014-10-31 DIAGNOSIS — N10 Acute tubulo-interstitial nephritis: Principal | ICD-10-CM | POA: Diagnosis present

## 2014-10-31 DIAGNOSIS — Z86711 Personal history of pulmonary embolism: Secondary | ICD-10-CM | POA: Diagnosis present

## 2014-10-31 DIAGNOSIS — F419 Anxiety disorder, unspecified: Secondary | ICD-10-CM | POA: Diagnosis present

## 2014-10-31 DIAGNOSIS — J45909 Unspecified asthma, uncomplicated: Secondary | ICD-10-CM | POA: Diagnosis present

## 2014-10-31 DIAGNOSIS — R1110 Vomiting, unspecified: Secondary | ICD-10-CM

## 2014-10-31 DIAGNOSIS — F3181 Bipolar II disorder: Secondary | ICD-10-CM | POA: Diagnosis present

## 2014-10-31 DIAGNOSIS — Z79899 Other long term (current) drug therapy: Secondary | ICD-10-CM

## 2014-10-31 DIAGNOSIS — R079 Chest pain, unspecified: Secondary | ICD-10-CM

## 2014-10-31 DIAGNOSIS — D509 Iron deficiency anemia, unspecified: Secondary | ICD-10-CM | POA: Diagnosis present

## 2014-10-31 DIAGNOSIS — I472 Ventricular tachycardia: Secondary | ICD-10-CM | POA: Diagnosis present

## 2014-10-31 DIAGNOSIS — N12 Tubulo-interstitial nephritis, not specified as acute or chronic: Secondary | ICD-10-CM | POA: Insufficient documentation

## 2014-10-31 DIAGNOSIS — Z7901 Long term (current) use of anticoagulants: Secondary | ICD-10-CM

## 2014-10-31 DIAGNOSIS — B961 Klebsiella pneumoniae [K. pneumoniae] as the cause of diseases classified elsewhere: Secondary | ICD-10-CM | POA: Diagnosis present

## 2014-10-31 DIAGNOSIS — R109 Unspecified abdominal pain: Secondary | ICD-10-CM

## 2014-10-31 DIAGNOSIS — Z23 Encounter for immunization: Secondary | ICD-10-CM

## 2014-10-31 DIAGNOSIS — Z9119 Patient's noncompliance with other medical treatment and regimen: Secondary | ICD-10-CM | POA: Diagnosis present

## 2014-10-31 DIAGNOSIS — J189 Pneumonia, unspecified organism: Secondary | ICD-10-CM

## 2014-10-31 DIAGNOSIS — E876 Hypokalemia: Secondary | ICD-10-CM

## 2014-10-31 LAB — FERRITIN: Ferritin: 44 ng/mL (ref 10–291)

## 2014-10-31 LAB — CBC WITH DIFFERENTIAL/PLATELET
BASOS ABS: 0 10*3/uL (ref 0.0–0.1)
Basophils Relative: 0 % (ref 0–1)
Eosinophils Absolute: 0 10*3/uL (ref 0.0–0.7)
Eosinophils Relative: 0 % (ref 0–5)
HEMATOCRIT: 24.2 % — AB (ref 36.0–46.0)
Hemoglobin: 7.2 g/dL — ABNORMAL LOW (ref 12.0–15.0)
LYMPHS ABS: 1.2 10*3/uL (ref 0.7–4.0)
Lymphocytes Relative: 14 % (ref 12–46)
MCH: 20.7 pg — AB (ref 26.0–34.0)
MCHC: 29.8 g/dL — AB (ref 30.0–36.0)
MCV: 69.5 fL — AB (ref 78.0–100.0)
MONOS PCT: 9 % (ref 3–12)
Monocytes Absolute: 0.8 10*3/uL (ref 0.1–1.0)
NEUTROS ABS: 6.6 10*3/uL (ref 1.7–7.7)
Neutrophils Relative %: 77 % (ref 43–77)
Platelets: 269 10*3/uL (ref 150–400)
RBC: 3.48 MIL/uL — ABNORMAL LOW (ref 3.87–5.11)
RDW: 18.7 % — AB (ref 11.5–15.5)
WBC: 8.6 10*3/uL (ref 4.0–10.5)

## 2014-10-31 LAB — URINALYSIS, ROUTINE W REFLEX MICROSCOPIC
Bilirubin Urine: NEGATIVE
Glucose, UA: NEGATIVE mg/dL
HGB URINE DIPSTICK: NEGATIVE
KETONES UR: 15 mg/dL — AB
Nitrite: NEGATIVE
PROTEIN: NEGATIVE mg/dL
Specific Gravity, Urine: 1.021 (ref 1.005–1.030)
Urobilinogen, UA: 0.2 mg/dL (ref 0.0–1.0)
pH: 5.5 (ref 5.0–8.0)

## 2014-10-31 LAB — MAGNESIUM: Magnesium: 1.7 mg/dL (ref 1.5–2.5)

## 2014-10-31 LAB — HEPATIC FUNCTION PANEL
ALT: 11 U/L (ref 0–35)
AST: 18 U/L (ref 0–37)
Albumin: 3.1 g/dL — ABNORMAL LOW (ref 3.5–5.2)
Alkaline Phosphatase: 50 U/L (ref 39–117)
Total Bilirubin: 0.4 mg/dL (ref 0.3–1.2)
Total Protein: 7.4 g/dL (ref 6.0–8.3)

## 2014-10-31 LAB — BASIC METABOLIC PANEL
Anion gap: 14 (ref 5–15)
BUN: 9 mg/dL (ref 6–23)
CALCIUM: 8.9 mg/dL (ref 8.4–10.5)
CO2: 21 mEq/L (ref 19–32)
CREATININE: 0.89 mg/dL (ref 0.50–1.10)
Chloride: 100 mEq/L (ref 96–112)
GFR calc Af Amer: >90 mL/min (ref >90)
GFR calc non Af Amer: 86 mL/min — ABNORMAL LOW (ref >90)
GLUCOSE: 91 mg/dL (ref 70–99)
Potassium: 3.6 mEq/L — ABNORMAL LOW (ref 3.7–5.3)
Sodium: 135 mEq/L — ABNORMAL LOW (ref 137–147)

## 2014-10-31 LAB — VITAMIN B12: Vitamin B-12: 271 pg/mL (ref 211–911)

## 2014-10-31 LAB — IRON AND TIBC: UIBC: 330 ug/dL (ref 125–400)

## 2014-10-31 LAB — URINE MICROSCOPIC-ADD ON

## 2014-10-31 LAB — RETICULOCYTES
RBC.: 3.45 MIL/uL — ABNORMAL LOW (ref 3.87–5.11)
RETIC COUNT ABSOLUTE: 24.2 10*3/uL (ref 19.0–186.0)
Retic Ct Pct: 0.7 % (ref 0.4–3.1)

## 2014-10-31 LAB — PROTIME-INR
INR: 1.24 (ref 0.00–1.49)
PROTHROMBIN TIME: 15.7 s — AB (ref 11.6–15.2)

## 2014-10-31 LAB — LIPASE, BLOOD: Lipase: 20 U/L (ref 11–59)

## 2014-10-31 LAB — FOLATE: Folate: 12.6 ng/mL

## 2014-10-31 LAB — POC OCCULT BLOOD, ED: Fecal Occult Bld: NEGATIVE

## 2014-10-31 LAB — ABO/RH: ABO/RH(D): O POS

## 2014-10-31 MED ORDER — WARFARIN - PHARMACIST DOSING INPATIENT: Freq: Every day | Status: DC

## 2014-10-31 MED ORDER — SODIUM CHLORIDE 0.9 % IV SOLN
1000.0000 mL | Freq: Once | INTRAVENOUS | Status: AC
  Administered 2014-10-31: 1000 mL via INTRAVENOUS

## 2014-10-31 MED ORDER — ONDANSETRON HCL 4 MG/2ML IJ SOLN
4.0000 mg | Freq: Once | INTRAMUSCULAR | Status: AC
  Administered 2014-10-31: 4 mg via INTRAVENOUS
  Filled 2014-10-31: qty 2

## 2014-10-31 MED ORDER — MORPHINE SULFATE 4 MG/ML IJ SOLN
4.0000 mg | Freq: Once | INTRAMUSCULAR | Status: AC
  Administered 2014-10-31: 4 mg via INTRAVENOUS
  Filled 2014-10-31: qty 1

## 2014-10-31 MED ORDER — ALBUTEROL SULFATE (2.5 MG/3ML) 0.083% IN NEBU: 3.0000 mL | INHALATION_SOLUTION | RESPIRATORY_TRACT | Status: DC | PRN

## 2014-10-31 MED ORDER — MORPHINE SULFATE 4 MG/ML IJ SOLN
4.0000 mg | INTRAMUSCULAR | Status: DC | PRN
  Administered 2014-10-31 – 2014-11-01 (×3): 4 mg via INTRAVENOUS
  Filled 2014-10-31 (×4): qty 1

## 2014-10-31 MED ORDER — ONDANSETRON HCL 4 MG/2ML IJ SOLN
4.0000 mg | Freq: Four times a day (QID) | INTRAMUSCULAR | Status: DC | PRN
  Administered 2014-10-31 – 2014-11-06 (×17): 4 mg via INTRAVENOUS
  Filled 2014-10-31 (×17): qty 2

## 2014-10-31 MED ORDER — ACYCLOVIR 200 MG PO CAPS
400.0000 mg | ORAL_CAPSULE | Freq: Two times a day (BID) | ORAL | Status: DC
  Administered 2014-10-31 – 2014-11-06 (×12): 400 mg via ORAL
  Filled 2014-10-31 (×16): qty 2

## 2014-10-31 MED ORDER — PNEUMOCOCCAL VAC POLYVALENT 25 MCG/0.5ML IJ INJ
0.5000 mL | INJECTION | INTRAMUSCULAR | Status: AC
  Administered 2014-11-01: 0.5 mL via INTRAMUSCULAR
  Filled 2014-10-31 (×2): qty 0.5

## 2014-10-31 MED ORDER — ACETAMINOPHEN 325 MG PO TABS
650.0000 mg | ORAL_TABLET | ORAL | Status: DC | PRN
  Administered 2014-11-01 – 2014-11-06 (×2): 650 mg via ORAL
  Filled 2014-10-31 (×2): qty 2

## 2014-10-31 MED ORDER — HYDROMORPHONE HCL 1 MG/ML IJ SOLN
1.0000 mg | Freq: Once | INTRAMUSCULAR | Status: DC
  Filled 2014-10-31: qty 1

## 2014-10-31 MED ORDER — SODIUM CHLORIDE 0.9 % IV SOLN
INTRAVENOUS | Status: DC
  Administered 2014-10-31: 100 mL/h via INTRAVENOUS
  Administered 2014-11-01: 02:00:00 via INTRAVENOUS

## 2014-10-31 MED ORDER — SODIUM CHLORIDE 0.9 % IV SOLN
1000.0000 mL | INTRAVENOUS | Status: DC
  Administered 2014-10-31: 1000 mL via INTRAVENOUS

## 2014-10-31 MED ORDER — POTASSIUM CHLORIDE CRYS ER 20 MEQ PO TBCR
40.0000 meq | EXTENDED_RELEASE_TABLET | Freq: Once | ORAL | Status: DC
  Filled 2014-10-31: qty 2

## 2014-10-31 MED ORDER — ONDANSETRON HCL 4 MG/2ML IJ SOLN: 4.0000 mg | Freq: Three times a day (TID) | INTRAMUSCULAR | Status: DC | PRN

## 2014-10-31 MED ORDER — POTASSIUM CHLORIDE 10 MEQ/100ML IV SOLN
10.0000 meq | INTRAVENOUS | Status: AC
  Administered 2014-10-31 – 2014-11-01 (×4): 10 meq via INTRAVENOUS
  Filled 2014-10-31 (×4): qty 100

## 2014-10-31 MED ORDER — MORPHINE SULFATE 4 MG/ML IJ SOLN
4.0000 mg | Freq: Once | INTRAMUSCULAR | Status: AC
Start: 2014-10-31 — End: 2014-10-31
  Administered 2014-10-31: 4 mg via INTRAVENOUS
  Filled 2014-10-31: qty 1

## 2014-10-31 MED ORDER — WARFARIN SODIUM 5 MG PO TABS
5.0000 mg | ORAL_TABLET | Freq: Once | ORAL | Status: AC
  Administered 2014-10-31: 5 mg via ORAL
  Filled 2014-10-31: qty 1

## 2014-10-31 MED ORDER — DEXTROSE 5 % IV SOLN
1.0000 g | Freq: Once | INTRAVENOUS | Status: AC
  Administered 2014-10-31: 1 g via INTRAVENOUS
  Filled 2014-10-31: qty 10

## 2014-10-31 MED ORDER — SODIUM CHLORIDE 0.9 % IV SOLN: INTRAVENOUS | Status: DC

## 2014-10-31 MED ORDER — HYDROCODONE-ACETAMINOPHEN 5-325 MG PO TABS: 1.0000 | ORAL_TABLET | Freq: Four times a day (QID) | ORAL | Status: DC | PRN

## 2014-10-31 MED ORDER — CEFTRIAXONE SODIUM IN DEXTROSE 20 MG/ML IV SOLN
1.0000 g | INTRAVENOUS | Status: DC
  Administered 2014-11-01 – 2014-11-03 (×3): 1 g via INTRAVENOUS
  Filled 2014-10-31 (×3): qty 50

## 2014-10-31 NOTE — Plan of Care (Signed)
Problem: Consults Goal: General Medical Patient Education See Patient Education Module for specific education. Outcome: Completed/Met Date Met:  10/31/14

## 2014-10-31 NOTE — ED Notes (Signed)
Pt claims she is a very difficult stick.  Had u/s IV placed.  Would like labs pulled from there.  RN aware.

## 2014-10-31 NOTE — ED Provider Notes (Signed)
CSN: [id number]     Arrival date & time 10/31/14  4709 History   First MD Initiated Contact with Patient 10/31/14 [phone]     Chief Complaint  Patient presents with  . Nephritis     (Consider location/radiation/quality/duration/timing/severity/associated sxs/prior Treatment) The history is provided by the patient.   28 year old female had been in the emergency department 2 days ago and diagnosed with urinary tract infection and discharged with prescription for cephalexin. She states that she never started feeling any better. She continues to have pain in the left flank which radiates to the left upper quadrant. She rates pain at 9/10. Nothing makes it better nothing makes it worse. She continues to urinary urgency, frequency, tenesmus, dysuria. She had a fever initially but states the fever broke last night. She continues to have chills and sweats. She developed nausea and vomiting following discharge from emergency department and has only been able to hold down 1 dose of her cephalexin. She is also complaining of some pain in her neck with radiation into both arms. That had been present since she got sick.  Past Medical History  Diagnosis Date  . Asthma   . 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 Bipolar II disorder   . Suicidal ideations   . History of blood clots     x3  . Anemia   . Urinary tract infection   . Herpes   . Anxiety   . Eclamptic seizure 2003  . Fibroid   . Genital herpes   . Preeclampsia 2003    with first pregnancy  . Pregnancy induced hypertension 2003    seized 2wks after  . Pyelonephritis 2012  . Seizures 2003    after first delivery  . Headache(784.0)   . Migraines   . History of blood transfusion MARCH 2013  . Depression     doing ok  . Complication of anesthesia 2003    ASPIRATED DURING EMERGENCY CS   Past Surgical History  Procedure Laterality Date  . Cesarean section   12/2001 and 01/2008  . Induced abortion  01/2010  . Cesarean section  06/26/2012    Procedure: CESAREAN SECTION;  Surgeon: Lavonia Drafts, MD;  Location: [facility];  Service: Obstetrics;  Laterality: N/A;   Family History  Problem Relation Age of Onset  . Diabetes Mother     Type 2  . Anemia Sister   . Hypertension Sister   . Anesthesia problems Neg Hx    History  Substance Use Topics  . Smoking status: Never Smoker   . Smokeless tobacco: Never Used  . Alcohol Use: No   OB History    Gravida Para Term Preterm AB TAB SAB Ectopic Multiple Living   4 3 3  0 1 1 0 0 0 3     Review of Systems  All other systems reviewed and are negative.     Allergies  Review of patient's allergies indicates no known allergies.  Home Medications   Prior to Admission medications   Medication Sig Start Date End Date Taking? Authorizing Provider  acyclovir (ZOVIRAX) 200 MG capsule Take 2 capsules (400 mg total) by mouth 2 (two) times daily. 07/25/14   Nobie Putnam, DO  albuterol (VENTOLIN HFA) 108 (90 BASE) MCG/ACT inhaler Inhale 2 puffs into the lungs every 4 (four) hours as needed for wheezing or shortness of breath. For dyspnea. 06/29/12   Truett Mainland, DO  ASPIRIN PO Take 4 tablets by mouth as needed (fever and pain).    Historical Provider, MD  cephALEXin (KEFLEX) 500 MG capsule Take 1 capsule (500 mg total) by mouth 3 (three) times daily. X 10 days 10/29/14   Ephraim Hamburger, MD  fluconazole (DIFLUCAN) 150 MG tablet Take 1 tablet (150 mg total) by mouth daily. 10/30/14   Hyman Bible, PA-C  HYDROcodone-acetaminophen (NORCO/VICODIN) 5-325 MG per tablet Take 1 tablet by mouth every 6 (six) hours as needed for severe pain. 10/29/14   Ephraim Hamburger, MD  Rivaroxaban (XARELTO) 15 MG TABS tablet Take 1 tablet (15 mg total) by mouth 2 (two) times daily with a meal. 07/16/13   Lupita Dawn, MD  SUMAtriptan (IMITREX) 25 MG tablet Take 1 tablet at start of migraine headache. If it is  not resolved, you may take a 2nd tablet 2 hours later. 08/14/13   Nobie Putnam, DO  warfarin (COUMADIN) 5 MG tablet Take 5 mg by mouth daily.    Historical Provider, MD   BP 128/68 mmHg  Pulse 102  Temp(Src) 98.9 F (37.2 C) (Oral)  Resp 18  SpO2 98%  LMP 10/15/2014 Physical Exam  Nursing note and vitals reviewed.  28 year old female, resting comfortably and in no acute distress. Vital signs are significant for borderline tachycardia. Oxygen saturation is 98%, which is normal. Head is normocephalic and atraumatic. PERRLA, EOMI. Oropharynx is clear. Neck is nontender and supple without adenopathy or JVD. Back is nontender in the midline. There is mild to moderate left CVA tenderness. Lungs are clear without rales, wheezes, or rhonchi. Chest is nontender. Heart has regular rate and rhythm without murmur. Abdomen is soft, flat, with moderate tenderness in the left mid and upper abdomen. There are no masses or hepatosplenomegaly and peristalsis is hypoactive. Extremities have no cyanosis or edema, full range of motion is present. Skin is warm and dry without rash. Neurologic: Mental status is normal, cranial nerves are intact, there are no motor or sensory deficits.  ED Course  Procedures (including critical care time) Labs Review Results for orders placed or performed during the hospital encounter of 36/14/43  Basic metabolic panel  Result Value Ref Range   Sodium 135 (L) 137 - 147 mEq/L   Potassium 3.6 (L) 3.7 - 5.3 mEq/L   Chloride 100 96 - 112 mEq/L   CO2 21 19 - 32 mEq/L   Glucose, Bld 91 70 - 99 mg/dL   BUN 9 6 - 23 mg/dL   Creatinine, Ser 0.89 0.50 - 1.10 mg/dL   Calcium 8.9 8.4 - 10.5 mg/dL   GFR calc non Af Amer 86 (L) >90 mL/min   GFR calc Af Amer >90 >90 mL/min   Anion gap 14 5 - 15  Urinalysis, Routine w reflex microscopic  Result Value Ref Range   Color, Urine YELLOW YELLOW   APPearance CLOUDY (A) CLEAR   Specific Gravity, Urine 1.021 1.005 - 1.030   pH  5.5 5.0 - 8.0   Glucose, UA NEGATIVE NEGATIVE mg/dL   Hgb urine dipstick NEGATIVE NEGATIVE   Bilirubin Urine NEGATIVE NEGATIVE   Ketones, ur 15 (A) NEGATIVE mg/dL   Protein, ur NEGATIVE NEGATIVE mg/dL   Urobilinogen, UA 0.2 0.0 - 1.0 mg/dL   Nitrite NEGATIVE NEGATIVE   Leukocytes, UA MODERATE (A) NEGATIVE  CBC with Differential  Result Value Ref Range   WBC 8.6 4.0 - 10.5 K/uL   RBC 3.48 (L) 3.87 - 5.11 MIL/uL   Hemoglobin 7.2 (  L) 12.0 - 15.0 g/dL   HCT 24.2 (L) 36.0 - 46.0 %   MCV 69.5 (L) 78.0 - 100.0 fL   MCH 20.7 (L) 26.0 - 34.0 pg   MCHC 29.8 (L) 30.0 - 36.0 g/dL   RDW 18.7 (H) 11.5 - 15.5 %   Platelets 269 150 - 400 K/uL   Neutrophils Relative % 77 43 - 77 %   Lymphocytes Relative 14 12 - 46 %   Monocytes Relative 9 3 - 12 %   Eosinophils Relative 0 0 - 5 %   Basophils Relative 0 0 - 1 %   Neutro Abs 6.6 1.7 - 7.7 K/uL   Lymphs Abs 1.2 0.7 - 4.0 K/uL   Monocytes Absolute 0.8 0.1 - 1.0 K/uL   Eosinophils Absolute 0.0 0.0 - 0.7 K/uL   Basophils Absolute 0.0 0.0 - 0.1 K/uL   RBC Morphology POLYCHROMASIA PRESENT   Urine microscopic-add on  Result Value Ref Range   Squamous Epithelial / LPF RARE RARE   WBC, UA 3-6 <3 WBC/hpf   Bacteria, UA RARE RARE  POC occult blood, ED Provider will collect  Result Value Ref Range   Fecal Occult Bld NEGATIVE NEGATIVE   Imaging Review Dg Chest Port 1 View  (if Code Sepsis Called)  10/29/2014   CLINICAL DATA:  Fevers.  Chill and chest pain.  Some SOB.  EXAM: PORTABLE CHEST - 1 VIEW  COMPARISON:  07/31/2013.  FINDINGS: The heart size and mediastinal contours are within normal limits. Both lungs are clear. The visualized skeletal structures are unremarkable.  IMPRESSION: No active disease.   Electronically Signed   By: Kerby Moors M.D.   On: 10/29/2014 20:31   MDM   Final diagnoses:  Pyelonephritis, acute  Microcytic anemia  Intractable vomiting with nausea, vomiting of unspecified type    Urinary tract infection with persistent  symptoms and no onset of nausea and vomiting. Old records were reviewed confirming ED visit 2 days ago with prescription for cephalexin. She was febrile at that time. Urine culture has come back positive but without identification of infecting organism or sensitivities. She will be given IV hydration and repeat dose of ceftriaxone. Ondansetron is given for nausea and hydromorphone is given for pain. Mucosa vomiting, may need to consider inpatient management. Of note, she is taking rivaroxaban for history of pulmonary embolism. Also, hemoglobin from 2 days ago was 8.3 which is a drop from her baseline.  Rectal exam shows no hemorrhoids, small amount of light brown stool which is Hemoccult negative. Urinalysis actually shows significant improvement but she continues to be symptomatic and has had a significant drop in hemoglobin. She will need to be admitted. Case is discussed with Dr. Doreene Nest tried hospitalist who agrees to admit the patient. Anemia panel has been sent.  Delora Fuel, MD 36/46/80 [id number]

## 2014-10-31 NOTE — Progress Notes (Signed)
UR completed 

## 2014-10-31 NOTE — Progress Notes (Addendum)
ANTICOAGULATION CONSULT NOTE - Initial Consult  Pharmacy Consult for warfarin Indication: History of multiple PEs  No Known Allergies  Patient Measurements:   Heparin Dosing Weight:   Vital Signs: Temp: 98.8 F (37.1 C) (11/27 1327) Temp Source: Oral (11/27 1133) BP: 109/65 mmHg (11/27 1327) Pulse Rate: 97 (11/27 1327)  Labs:  Recent Labs  10/29/14 2004 10/29/14 2042 10/31/14 0926 10/31/14 0942  HGB  --  8.3*  --  7.2*  HCT  --  27.8*  --  24.2*  PLT  --  334  --  269  CREATININE 0.86  --  0.89  --     CrCl cannot be calculated (Unknown ideal weight.).   Medical History: Past Medical History  Diagnosis Date  . Asthma   . 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 Bipolar II disorder   . Suicidal ideations   . History of blood clots     x3  . Anemia   . Urinary tract infection   . Herpes   . Anxiety   . Eclamptic seizure 2003  . Fibroid   . Genital herpes   . Preeclampsia 2003    with first pregnancy  . Pregnancy induced hypertension 2003    seized 2wks after  . Pyelonephritis 2012  . Seizures 2003    after first delivery  . Headache(784.0)   . Migraines   . History of blood transfusion MARCH 2013  . Depression     doing ok  . Complication of anesthesia 2003    ASPIRATED DURING EMERGENCY CS   Assessment: 38 YOF presents to ED with symptoms consistent with UTI.  She was seen in ED 11/25 and sent home with rx for cephalexin but only able to take one dose d/t N/V.   Appears she was given samples of rivaroxaban but stopped in Feb 2015.  She resumed on warfarin.  It does not appear she is compliant with follow-up (at least no longer with [facility]).  Her Hgb is noted to be 7.3 in ED, FOBT was negative.  Previous value was 8.3 on 11/25.   Home dose reported as 5mg  daily  Today, 11/20:  INR = 1.24 (calls compliance into question)  CBC: Hgb = 7.2, pltc WNL   (anemia panel: total Iron < 10)  Diet: clears   Drug interactions: no major interactions - follow antibiotic choices  Goal of Therapy:  INR 2-3 Monitor platelets by anticoagulation protocol: Yes   Plan:   Warfarin 5mg  PO x 1 (this is reported home dose but suspect noncompliance based on previous notes and INR today).    Daily INR  Follow CBC  Follow-up iron orders  Doreene Eland, PharmD, BCPS.   Pager: [phone]  10/31/2014,6:12 PM

## 2014-10-31 NOTE — H&P (Signed)
[facility] History and Physical  Paula Benson VPX:[id number] DOB: [birth year] DOA: 10/31/2014  Referring physician: EDP PCP: Nobie Putnam, DO   Chief Complaint: nausea, vomiting and flank pain.   HPI: Paula Benson is a 28 y.o. female with h/o anemia, pulmonary embolism, came in on Tuesday for nausea, vomiting and abdominal pain and was found to have mild pyelonephritis and was discharged home on keflex. She got one dose of keflex but reports persistent nausea, vomiting and left flank pain more than the right side. She reports chills, and fevers at home. On arrival to ED, she was found to have mild UTI, and her H&H has dropped significalntly when compared to 2014. She was referred for admission for the evaluation of the above symptoms and the management of pyelonephritis.  She denies diarrhea,. She denies heavy menstrual cycles. She reports she has a h/o of anemia and received blood transfusions  Review of Systems:  See HPI otherwise negative.   Past Medical History  Diagnosis Date  . Asthma   . 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 Bipolar II disorder   . Suicidal ideations   . History of blood clots     x3  . Anemia   . Urinary tract infection   . Herpes   . Anxiety   . Eclamptic seizure 2003  . Fibroid   . Genital herpes   . Preeclampsia 2003    with first pregnancy  . Pregnancy induced hypertension 2003    seized 2wks after  . Pyelonephritis 2012  . Seizures 2003    after first delivery  . Headache(784.0)   . Migraines   . History of blood transfusion MARCH 2013  . Depression     doing ok  . Complication of anesthesia 2003    ASPIRATED DURING EMERGENCY CS   Past Surgical History  Procedure Laterality Date  . Cesarean section  12/2001 and 01/2008  . Induced abortion  01/2010  . Cesarean section  06/26/2012    Procedure: CESAREAN SECTION;  Surgeon: Lavonia Drafts, MD;  Location: [facility];  Service: Obstetrics;  Laterality: N/A;   Social History:  reports that she has never smoked. She has never used smokeless tobacco. She reports that she does not drink alcohol or use illicit drugs.  No Known Allergies  Family History  Problem Relation Age of Onset  . Diabetes Mother     Type 2  . Anemia Sister   . Hypertension Sister   . Anesthesia problems Neg Hx      Prior to Admission medications   Medication Sig Start Date End Date Taking? Authorizing Provider  acyclovir (ZOVIRAX) 200 MG capsule Take 2 capsules (400 mg total) by mouth 2 (two) times daily. 07/25/14  Yes Alexander Parks Ranger, DO  albuterol (VENTOLIN HFA) 108 (90 BASE) MCG/ACT inhaler Inhale 2 puffs into the lungs every 4 (four) hours as needed for wheezing or shortness of breath. For dyspnea. 06/29/12  Yes Tanna Savoy Stinson, DO  ASPIRIN PO Take 4 tablets by mouth as needed (fever and pain).   Yes Historical Provider, MD  cephALEXin (KEFLEX) 500 MG capsule Take 1 capsule (500 mg total) by mouth 3 (three) times daily. X 10 days 10/29/14  Yes Ephraim Hamburger, MD  HYDROcodone-acetaminophen (NORCO/VICODIN) 5-325 MG per tablet Take 1 tablet by mouth every 6 (six) hours as needed for severe pain. 10/29/14  Yes Ephraim Hamburger,  MD  SUMAtriptan (IMITREX) 25 MG tablet Take 1 tablet at start of migraine headache. If it is not resolved, you may take a 2nd tablet 2 hours later. 08/14/13  Yes Nobie Putnam, DO  warfarin (COUMADIN) 5 MG tablet Take 5 mg by mouth daily.   Yes Historical Provider, MD  fluconazole (DIFLUCAN) 150 MG tablet Take 1 tablet (150 mg total) by mouth daily. 10/30/14   Hyman Bible, PA-C   Physical Exam: Filed Vitals:   10/31/14 1247 10/31/14 1300 10/31/14 1306 10/31/14 1327  BP:  105/69  109/65  Pulse:  94  97  Temp:    98.8 F (37.1 C)  TempSrc:      Resp: 16  16 18   SpO2:  99%  100%    Wt Readings from Last 3 Encounters:  02/05/14 73.936 kg (163 lb)    11/06/13 78.019 kg (172 lb)  08/23/13 75.751 kg (167 lb)    General:  Appears calm and comfortable Eyes: PERRL, normal lids, irises & conjunctiva Neck: no LAD, masses or thyromegaly Cardiovascular: RRR, no m/r/g. No LE edema. Telemetry: SR, no arrhythmias  Respiratory: CTA bilaterally, no w/r/r. Normal respiratory effort. Abdomen: soft, ntnd, tenderness in the left flank.  Skin: no rash or induration seen on limited exam Musculoskeletal: grossly normal tone BUE/BLE Psychiatric: grossly normal mood and affect, speech fluent and appropriate Neurologic: grossly non-focal.          Labs on Admission:  Basic Metabolic Panel:  Recent Labs Lab 10/29/14 2004 10/31/14 0926  NA 132* 135*  K 3.7 3.6*  CL 96 100  CO2 18* 21  GLUCOSE 92 91  BUN 10 9  CREATININE 0.86 0.89  CALCIUM 9.5 8.9   Liver Function Tests:  Recent Labs Lab 10/29/14 2004 10/31/14 1547  AST 19 18  ALT 9 11  ALKPHOS 54 50  BILITOT 0.9 0.4  PROT 7.8 7.4  ALBUMIN 3.9 3.1*    Recent Labs Lab 10/31/14 1547  LIPASE 20   No results for input(s): AMMONIA in the last 168 hours. CBC:  Recent Labs Lab 10/29/14 2042 10/31/14 0942  WBC 13.7* 8.6  NEUTROABS 11.5* 6.6  HGB 8.3* 7.2*  HCT 27.8* 24.2*  MCV 68.5* 69.5*  PLT 334 269   Cardiac Enzymes: No results for input(s): CKTOTAL, CKMB, CKMBINDEX, TROPONINI in the last 168 hours.  BNP (last 3 results) No results for input(s): PROBNP in the last 8760 hours. CBG: No results for input(s): GLUCAP in the last 168 hours.  Radiological Exams on Admission: US Renal  10/31/2014   CLINICAL DATA:  Left flank pain.  EXAM: RENAL/URINARY TRACT ULTRASOUND COMPLETE  COMPARISON:  None.  FINDINGS: Right Kidney:  Length: 11.2 cm. Echogenicity within normal limits. No mass or hydronephrosis visualized.  Left Kidney:  Length: 11.0 cm. Echogenicity within normal limits. No mass or hydronephrosis visualized.  Bladder:  Appears normal for degree of bladder distention.   IMPRESSION: Normal renal ultrasound.   Electronically Signed   By: Marin Olp M.D.   On: 10/31/2014 15:37   Dg Chest Port 1 View  (if Code Sepsis Called)  10/29/2014   CLINICAL DATA:  Fevers.  Chill and chest pain.  Some SOB.  EXAM: PORTABLE CHEST - 1 VIEW  COMPARISON:  07/31/2013.  FINDINGS: The heart size and mediastinal contours are within normal limits. Both lungs are clear. The visualized skeletal structures are unremarkable.  IMPRESSION: No active disease.   Electronically Signed   By: Kerby Moors M.D.   On:  10/29/2014 20:31    EKG: repeat EKG ordered.   Assessment/Plan Active Problems:   Acute pyelonephritis   Pyelonephritis   Acute pyelonephritis Started on rocpehin and urine cultures are pending.  US renal ordered.   Anemia stool for occult blood negative.  Anemia panel and smear for review ordered.   Pulmonary embolism Resume coumadin as per pharmacy.   DVT prophylaxis.   Code Status: full code.  DVT Prophylaxis: Family Communication: family at bedside.  Disposition Plan: admit to medsurg  Time spent: 55 min  [facility] Pager [phone]

## 2014-10-31 NOTE — ED Notes (Signed)
Pt diagnosed with kidney infection on Wednesday.  Unable to keep meds down d/t vomiting.  Back pain.  Fever.  Chills.

## 2014-11-01 ENCOUNTER — Inpatient Hospital Stay (HOSPITAL_COMMUNITY): Payer: Self-pay

## 2014-11-01 DIAGNOSIS — D509 Iron deficiency anemia, unspecified: Secondary | ICD-10-CM

## 2014-11-01 LAB — PROTIME-INR
INR: 1.21 (ref 0.00–1.49)
Prothrombin Time: 15.4 seconds — ABNORMAL HIGH (ref 11.6–15.2)

## 2014-11-01 LAB — CBC
HCT: 23.8 % — ABNORMAL LOW (ref 36.0–46.0)
HEMOGLOBIN: 7 g/dL — AB (ref 12.0–15.0)
MCH: 20.6 pg — AB (ref 26.0–34.0)
MCHC: 29.4 g/dL — AB (ref 30.0–36.0)
MCV: 70 fL — ABNORMAL LOW (ref 78.0–100.0)
Platelets: 261 10*3/uL (ref 150–400)
RBC: 3.4 MIL/uL — ABNORMAL LOW (ref 3.87–5.11)
RDW: 18.9 % — AB (ref 11.5–15.5)
WBC: 5.7 10*3/uL (ref 4.0–10.5)

## 2014-11-01 LAB — URINE CULTURE

## 2014-11-01 LAB — TROPONIN I
Troponin I: <0.3 ng/mL (ref <0.30)
Troponin I: <0.3 ng/mL (ref <0.30)
Troponin I: <0.3 ng/mL (ref <0.30)

## 2014-11-01 LAB — PREPARE RBC (CROSSMATCH)

## 2014-11-01 MED ORDER — AZITHROMYCIN 500 MG PO TABS
500.0000 mg | ORAL_TABLET | Freq: Every day | ORAL | Status: AC
  Administered 2014-11-01: 500 mg via ORAL
  Filled 2014-11-01: qty 1

## 2014-11-01 MED ORDER — WARFARIN SODIUM 10 MG PO TABS
10.0000 mg | ORAL_TABLET | Freq: Once | ORAL | Status: AC
  Administered 2014-11-01: 10 mg via ORAL
  Filled 2014-11-01: qty 1

## 2014-11-01 MED ORDER — ENOXAPARIN SODIUM 80 MG/0.8ML ~~LOC~~ SOLN
70.0000 mg | Freq: Two times a day (BID) | SUBCUTANEOUS | Status: DC
  Administered 2014-11-01 – 2014-11-05 (×9): 70 mg via SUBCUTANEOUS
  Filled 2014-11-01 (×11): qty 0.8

## 2014-11-01 MED ORDER — AZITHROMYCIN 250 MG PO TABS
250.0000 mg | ORAL_TABLET | Freq: Every day | ORAL | Status: AC
  Administered 2014-11-02 – 2014-11-05 (×4): 250 mg via ORAL
  Filled 2014-11-01 (×4): qty 1

## 2014-11-01 MED ORDER — DIPHENHYDRAMINE HCL 25 MG PO CAPS
25.0000 mg | ORAL_CAPSULE | Freq: Four times a day (QID) | ORAL | Status: DC | PRN
  Administered 2014-11-01 – 2014-11-05 (×9): 25 mg via ORAL
  Filled 2014-11-01 (×9): qty 1

## 2014-11-01 MED ORDER — SODIUM CHLORIDE 0.9 % IV SOLN
Freq: Once | INTRAVENOUS | Status: AC
  Administered 2014-11-02: 08:00:00 via INTRAVENOUS

## 2014-11-01 MED ORDER — IOHEXOL 350 MG/ML SOLN
100.0000 mL | Freq: Once | INTRAVENOUS | Status: AC | PRN
  Administered 2014-11-01: 100 mL via INTRAVENOUS

## 2014-11-01 MED ORDER — FERROUS SULFATE 325 (65 FE) MG PO TABS
325.0000 mg | ORAL_TABLET | Freq: Two times a day (BID) | ORAL | Status: DC
  Administered 2014-11-01 – 2014-11-06 (×10): 325 mg via ORAL
  Filled 2014-11-01 (×12): qty 1

## 2014-11-01 MED ORDER — HYDROCODONE-ACETAMINOPHEN 5-325 MG PO TABS
1.0000 | ORAL_TABLET | Freq: Four times a day (QID) | ORAL | Status: DC | PRN
  Administered 2014-11-01 – 2014-11-05 (×13): 1 via ORAL
  Filled 2014-11-01 (×13): qty 1

## 2014-11-01 MED ORDER — MORPHINE SULFATE 2 MG/ML IJ SOLN
1.0000 mg | INTRAMUSCULAR | Status: DC | PRN
  Administered 2014-11-01 (×2): 1 mg via INTRAVENOUS
  Filled 2014-11-01 (×2): qty 1

## 2014-11-01 MED ORDER — POLYETHYLENE GLYCOL 3350 17 G PO PACK
17.0000 g | PACK | Freq: Every day | ORAL | Status: DC
  Administered 2014-11-01 – 2014-11-03 (×3): 17 g via ORAL
  Filled 2014-11-01 (×3): qty 1

## 2014-11-01 NOTE — Progress Notes (Signed)
PROGRESS NOTE  Paula Benson [id number]:[id number] DOB: 07-02-1986 DOA: 10/31/2014 PCP: Nobie Putnam, DO  Summary: 28 year old woman with history of pulmonary embolism maintained on warfarin presented to the emergency department 11/25 with fever. Diagnosed with pyelonephritis and discharged on Keflex. INR was low. She presented again to the emergency department 11/27 with ongoing left flank pain, urinary symptoms, fever, nausea and vomiting with inability to take oral medications. She was noted to be anemic, rectal exam no hemorrhoids, hemoccult negative stool. She was admitted for further evaluation of pyelonephritis and anemia.   Assessment/Plan: 1. Pyelonephritis, cultures pending although likely to be unrevealing with the patient having been on antibiotics. 2. Microcytic IDA . Reports personal history of anemia and blood transfusions. Follow-up anemia panel. Review of the record, hemoglobin range 8-10 with numerous data points going back to 2012. Hemoccult was negative and she has no history of bleeding other than menses which she reports is normal. Suspect Metrorragia Anemia panel this admission and previously admission consistent with iron deficiency anemia. 3. Multiple PE, last "about 2013". CTA chest negative 2014, 2013, 2012. Rx for warfarin but noncompliant for several weeks.  4. Bipolar 2 disorder   She appears clinically stable. Plan continue antibiotics for pyelonephritis and follow-up culture data.  Chest pain has resolved at this point, etiology unclear. Troponin and EKG unremarkable. She has neither tachypnic nor tachycardic, however her history of multiple PE, low INR with noncompliance and symptoms warrant further evaluation. Plan CTA chest and bilateral lower extremity Dopplers. Lovenox and warfarin.  Anemia probably iron deficiency. Discussed with Dr. Benay Spice, plan trial of iron with close outpatient follow-up. No further inpatient investigation suggested at this point.  She has no evidence of active bleeding and currently is not on menstrual cycle. Monitor CBC while on Lovenox.  Patient of FP service at [facility]. She refuses transfer to [facility] to see her physicians.  Discussed plan in detail with patient.  Code Status: full code DVT prophylaxis: Lovenox, warfarin Family Communication: none present Disposition Plan: home  Murray Hodgkins, MD  [facility]  Pager [phone] If 7PM-7AM, please contact night-coverage at [email or web address], password [facility] 11/01/2014, 8:17 AM  LOS: 1 day   Consultants:    Procedures:    Antibiotics:  Ceftriaxone 11/27 >>  HPI/Subjective: Complained of chest pain early this morning, EKG obtained and was nonacute. Plans were made for serial troponin.  She has pain with deep inspiration, no real chest pain now (relieved with morphine). No LE edema. H/o multiple PE, not compliant with Coumadin for several weeks. Reports h/o anemia and blood transfusion. Denies heavy menses. Denies bleeding. Last cycle 2 weeks ago, 4 days, normal.  Objective: Filed Vitals:   10/31/14 1306 10/31/14 1327 10/31/14 2026 11/01/14 0557  BP:  109/65 126/78 114/71  Pulse:  97 89 88  Temp:  98.8 F (37.1 C) 99.1 F (37.3 C) 99.1 F (37.3 C)  TempSrc:   Oral Oral  Resp: 16 18 18 17   SpO2:  100% 100% 100%    Intake/Output Summary (Last 24 hours) at 11/01/14 0817 Last data filed at 11/01/14 0370  Gross per 24 hour  Intake 1728.33 ml  Output      1 ml  Net 1727.33 ml     There were no vitals filed for this visit.  Exam:     Afebrile, vital signs stable. No hypoxia.  General: appears calm, comfortable  Psych: speech fluent and clear  CV: RRR no m/r/g. No LE edema. No leg pain.  Respiratory: CTA bilaterally  no w/r/r. Normal resp effort.  Data Reviewed:  Basic metabolic panel unremarkable on admission  Troponin negative  Hemoglobin without significant change, 7.0.  INR subtherapeutic 1.21   Renal ultrasound  unremarkable  Urine pregnancy negative  Scheduled Meds: . acyclovir  400 mg Oral BID  . cefTRIAXone (ROCEPHIN)  IV  1 g Intravenous Q24H  . pneumococcal 23 valent vaccine  0.5 mL Intramuscular Tomorrow-1000  . potassium chloride  40 mEq Oral Once  . Warfarin - Pharmacist Dosing Inpatient   Does not apply q1800   Continuous Infusions: . sodium chloride 100 mL/hr at 11/01/14 0209    Active Problems:   Iron deficiency anemia   Acute pyelonephritis   Pyelonephritis   Hypokalemia   Time spent 35 minutes, >50% coordination of care, counseling.       [place]

## 2014-11-01 NOTE — Plan of Care (Signed)
Problem: Phase I Progression Outcomes Goal: Pain controlled with appropriate interventions Outcome: Completed/Met Date Met:  11/01/14 Goal: OOB as tolerated unless otherwise ordered Outcome: Completed/Met Date Met:  11/01/14 Goal: Voiding-avoid urinary catheter unless indicated Outcome: Completed/Met Date Met:  11/01/14 Goal: Hemodynamically stable Outcome: Progressing

## 2014-11-01 NOTE — Progress Notes (Addendum)
ANTICOAGULATION CONSULT NOTE -Follow up  Pharmacy Consult for Warfarin and Lovenox Indication: History of multiple PEs  No Known Allergies  Patient Measurements: Height: 5\' 3"  (160 cm) Weight: 154 lb 12.8 oz (70.217 kg) IBW/kg (Calculated) : 52.4 Heparin Dosing Weight: total body weight  Vital Signs: Temp: 99.1 F (37.3 C) (11/28 0557) Temp Source: Oral (11/28 0557) BP: 114/71 mmHg (11/28 0557) Pulse Rate: 88 (11/28 0557)  Labs:  Recent Labs  10/29/14 2004  10/29/14 2042 10/31/14 0926 10/31/14 0942 10/31/14 1855 11/01/14 0534  HGB  --   < > 8.3*  --  7.2*  --  7.0*  HCT  --   --  27.8*  --  24.2*  --  23.8*  PLT  --   --  334  --  269  --  261  LABPROT  --   --   --   --   --  15.7* 15.4*  INR  --   --   --   --   --  1.24 1.21  CREATININE 0.86  --   --  0.89  --   --   --   TROPONINI  --   --   --   --   --   --  <0.30  < > = values in this interval not displayed.  Estimated Creatinine Clearance: 86.8 mL/min (by C-G formula based on Cr of 0.89).  Medical History: Past Medical History  Diagnosis Date  . Asthma   . 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 Bipolar II disorder   . Suicidal ideations   . History of blood clots     x3  . Anemia   . Urinary tract infection   . Herpes   . Anxiety   . Eclamptic seizure 2003  . Fibroid   . Genital herpes   . Preeclampsia 2003    with first pregnancy  . Pregnancy induced hypertension 2003    seized 2wks after  . Pyelonephritis 2012  . Seizures 2003    after first delivery  . Headache(784.0)   . Migraines   . History of blood transfusion MARCH 2013  . Depression     doing ok  . Complication of anesthesia 2003    ASPIRATED DURING EMERGENCY CS   Assessment: 88 YOF presents to ED with symptoms consistent with UTI.  She was seen in ED 11/25 and sent home with rx for cephalexin but only able to take one dose d/t N/V.   Appears she  was given samples of rivaroxaban but stopped in Feb 2015.  She resumed on warfarin.  It does not appear she is compliant with follow-up (at least no longer with [facility]  Her Hgb is noted to be 7.3 in ED, FOBT was negative.  Previous value was 8.3 on 11/25.   Home dose reported as 5mg  daily - last dose unknown, admit INR 1.24 Warfarin doses from 11/27 = 5mg   Today, 11/28:  INR = 1.24 (compliance an issue)  CBC: Hgb = 7, pltc WNL  (anemia panel: total Iron < 10)  Diet: advanced to full liquid  Drug interactions: no major interactions - follow antibiotic choices  Add Lovenox bridging to therapeutic INR  Goal of Therapy:  INR 2-3 Monitor platelets by anticoagulation protocol: Yes   Plan:   Warfarin 10mg  today, give dose early  Add Lovenox 70mg  SQ q12, until  therapeutic INR  Daily INR  Follow CBC  Follow-up iron orders  Minda Ditto PharmD Pager [phone] 11/01/2014, 11:06 AM

## 2014-11-01 NOTE — Progress Notes (Signed)
[facility] progress note. Chief complaint. Chest pain. History of present illness. This 28 year old female in hospital with acute pyelonephritis and anemia. She complained to nursing of central chest pain this morning. A 12-lead EKG was obtained and indicates normal sinus rhythm without evidence of acute ischemia. To see the patient at bedside. She describes an aching pain midsternum and under both breasts. She denies associated nausea, diaphoresis, or radiation of the pain. She has no listed cardiac history other than a prior pulmonary emboli. Vital signs. Temperature 99.1, pulse 88, respirations 17, blood pressure 114/71. O2 sats 100%. General appearance. Well-developed female who is alert and in no distress. Cardiac. Rate and rhythm regular. Impression/plan. Problem #1. Chest pain. 12-lead EKG is unremarkable without evidence of acute ischemia. We'll obtain troponin now and then every 6 hours for a total of 3 sets. We'll repeat an EKG in 6 hours to evaluate for any changes.

## 2014-11-02 DIAGNOSIS — Z86711 Personal history of pulmonary embolism: Secondary | ICD-10-CM

## 2014-11-02 DIAGNOSIS — Z7901 Long term (current) use of anticoagulants: Secondary | ICD-10-CM

## 2014-11-02 DIAGNOSIS — J189 Pneumonia, unspecified organism: Secondary | ICD-10-CM

## 2014-11-02 DIAGNOSIS — R079 Chest pain, unspecified: Secondary | ICD-10-CM

## 2014-11-02 LAB — BASIC METABOLIC PANEL
ANION GAP: 12 (ref 5–15)
BUN: 6 mg/dL (ref 6–23)
CALCIUM: 8.6 mg/dL (ref 8.4–10.5)
CHLORIDE: 103 meq/L (ref 96–112)
CO2: 23 mEq/L (ref 19–32)
Creatinine, Ser: 0.9 mg/dL (ref 0.50–1.10)
GFR calc non Af Amer: 85 mL/min — ABNORMAL LOW (ref >90)
Glucose, Bld: 95 mg/dL (ref 70–99)
Potassium: 3.7 mEq/L (ref 3.7–5.3)
Sodium: 138 mEq/L (ref 137–147)

## 2014-11-02 LAB — CBC
HCT: 22.3 % — ABNORMAL LOW (ref 36.0–46.0)
HCT: 29.3 % — ABNORMAL LOW (ref 36.0–46.0)
HEMOGLOBIN: 6.7 g/dL — AB (ref 12.0–15.0)
HEMOGLOBIN: 9.3 g/dL — AB (ref 12.0–15.0)
MCH: 20.4 pg — ABNORMAL LOW (ref 26.0–34.0)
MCH: 22.6 pg — AB (ref 26.0–34.0)
MCHC: 30 g/dL (ref 30.0–36.0)
MCHC: 31.7 g/dL (ref 30.0–36.0)
MCV: 67.8 fL — ABNORMAL LOW (ref 78.0–100.0)
MCV: 71.1 fL — ABNORMAL LOW (ref 78.0–100.0)
Platelets: 273 10*3/uL (ref 150–400)
Platelets: 250 10*3/uL (ref 150–400)
RBC: 3.29 MIL/uL — ABNORMAL LOW (ref 3.87–5.11)
RBC: 4.12 MIL/uL (ref 3.87–5.11)
RDW: 18.7 % — ABNORMAL HIGH (ref 11.5–15.5)
RDW: 19.5 % — AB (ref 11.5–15.5)
WBC: 4.8 10*3/uL (ref 4.0–10.5)
WBC: 5.3 10*3/uL (ref 4.0–10.5)

## 2014-11-02 LAB — PROTIME-INR
INR: 1.36 (ref 0.00–1.49)
PROTHROMBIN TIME: 16.9 s — AB (ref 11.6–15.2)

## 2014-11-02 LAB — MAGNESIUM: MAGNESIUM: 1.8 mg/dL (ref 1.5–2.5)

## 2014-11-02 MED ORDER — POTASSIUM CHLORIDE CRYS ER 20 MEQ PO TBCR
40.0000 meq | EXTENDED_RELEASE_TABLET | Freq: Once | ORAL | Status: DC
Start: 2014-11-02 — End: 2014-11-02
  Filled 2014-11-02: qty 2

## 2014-11-02 MED ORDER — SODIUM CHLORIDE 0.9 % IV SOLN
Freq: Once | INTRAVENOUS | Status: AC
  Administered 2014-11-02: 07:00:00 via INTRAVENOUS

## 2014-11-02 MED ORDER — WARFARIN SODIUM 10 MG PO TABS
10.0000 mg | ORAL_TABLET | Freq: Once | ORAL | Status: AC
  Administered 2014-11-02: 10 mg via ORAL
  Filled 2014-11-02: qty 1

## 2014-11-02 MED ORDER — MORPHINE SULFATE 2 MG/ML IJ SOLN
2.0000 mg | Freq: Once | INTRAMUSCULAR | Status: AC
  Administered 2014-11-02: 2 mg via INTRAVENOUS
  Filled 2014-11-02: qty 1

## 2014-11-02 MED ORDER — POTASSIUM CHLORIDE 20 MEQ PO PACK
40.0000 meq | PACK | Freq: Once | ORAL | Status: AC
  Administered 2014-11-02: 40 meq via ORAL
  Filled 2014-11-02: qty 2

## 2014-11-02 MED ORDER — GI COCKTAIL ~~LOC~~
30.0000 mL | Freq: Once | ORAL | Status: AC
  Administered 2014-11-02: 30 mL via ORAL
  Filled 2014-11-02: qty 30

## 2014-11-02 MED ORDER — FLUCONAZOLE 150 MG PO TABS
150.0000 mg | ORAL_TABLET | Freq: Once | ORAL | Status: AC
  Administered 2014-11-02: 150 mg via ORAL
  Filled 2014-11-02: qty 1

## 2014-11-02 NOTE — Progress Notes (Signed)
PROGRESS NOTE  Paula Benson LFY:[id number] DOB: 11/28/1986 DOA: 10/31/2014 PCP: Nobie Putnam, DO  Summary: 28 year old woman with history of pulmonary embolism maintained on warfarin presented to the emergency department 11/25 with fever. Diagnosed with pyelonephritis and discharged on Keflex. INR was low. She presented again to the emergency department 11/27 with ongoing left flank pain, urinary symptoms, fever, nausea and vomiting with inability to take oral medications. She was noted to be anemic, rectal exam no hemorrhoids, hemoccult negative stool. She was admitted for further evaluation of pyelonephritis and anemia.   Assessment/Plan: 1. Acute pyelonephritis, cultures pending although unlikely to be revealing given patient was on antibiotics. 2. Community acquired pneumonia. Remains afebrile with stable hemodynamics. No hypoxia. 3. Microcytic IDA . Somewhat worse today with hydration. No active bleeding. Reports personal history of anemia and blood transfusions. Review of the record, hemoglobin range 8-10 with numerous data points going back to 2012. Hemoccult was negative and she has no history of bleeding other than menses which she reports is normal. Suspect metrorrhagia. Anemia panel this admission and previously admission consistent with iron deficiency anemia. 4. Multiple PE, last "about 2013". CTA chest negative 2014, 2013, 2012. Rx for warfarin but noncompliant for several weeks.  5. Bipolar 2 disorder   Appears clinically stable. Plan to continue empiric antibiotics for pyelonephritis and community acquired pneumonia  Complete PRBC transfusion.  CBC in AM  Magnesium normal. Supplement potassium.  Close outpatient follow-up for warfarin therapy and further evaluation of microcytic anemia.  Likely home in the next 48 hours.  Code Status: full code DVT prophylaxis: Lovenox, warfarin Family Communication: none present Disposition Plan: home  Murray Hodgkins,  MD  [facility]  Pager [phone] If 7PM-7AM, please contact night-coverage at [email or web address], password [facility] 11/02/2014, 10:24 AM  LOS: 2 days   Consultants:    Procedures:  Transfusion 2 units packed red blood cells 11/29  Antibiotics:  Ceftriaxone 11/27 >>  Zithromax 11/28 >>  HPI/Subjective: Hemoglobin 6.7 this morning, blood transfusion ordered. 3 beat NSVT, asymptomatic. Bilateral lower extremity venous duplex negative.  Overall feels about the same. Nervous. Poor sleep. Still has some right-sided chest pain. Some pain with deep breathing. Still has some pain with urination. Poor appetite.  Objective: Filed Vitals:   11/02/14 0540 11/02/14 0753 11/02/14 0820 11/02/14 0850  BP: 110/65 121/79 116/64 111/78  Pulse: 78 69 74 71  Temp: 99.7 F (37.6 C) 98.4 F (36.9 C) 99.4 F (37.4 C) 98.1 F (36.7 C)  TempSrc: Oral Oral Oral Oral  Resp: 18 16 18 18   Height:      Weight:      SpO2: 96% 100% 100% 100%    Intake/Output Summary (Last 24 hours) at 11/02/14 1024 Last data filed at 11/02/14 0850  Gross per 24 hour  Intake    915 ml  Output      4 ml  Net    911 ml     Filed Weights   11/01/14 0958  Weight: 70.217 kg (154 lb 12.8 oz)    Exam:     Afebrile, vital signs are stable. No hypoxia.  Alert. Speech fluent and clear. Appears mildly anxious. Mildly uncomfortable. Nontoxic.  Cardiovascular. Regular rate and rhythm. No murmur, rub or gallop. Telemetry sinus rhythm. 3 beat NSVT.  Respiratory clear to auscultation bilaterally. No wheezes, rales or rhonchi. Normal respiratory effort.  Data Reviewed:  Oral intake 580. Mulitple voids.  Hemoglobin 6.7.  Basic metabolic panel unremarkable.  Bilateral lower extremity venous Doppler negative.  Scheduled Meds: . acyclovir  400 mg Oral BID  . azithromycin  250 mg Oral Daily  . cefTRIAXone (ROCEPHIN)  IV  1 g Intravenous Q24H  . enoxaparin (LOVENOX) injection  70 mg Subcutaneous BID  . ferrous  sulfate  325 mg Oral BID WC  . polyethylene glycol  17 g Oral Daily  . potassium chloride  40 mEq Oral Once  . Warfarin - Pharmacist Dosing Inpatient   Does not apply q1800   Continuous Infusions:    Principal Problem:   Acute pyelonephritis Active Problems:   History of pulmonary embolus (PE)   Chronic anticoagulation   Iron deficiency anemia   Hypokalemia   CAP (community acquired pneumonia)   Time spent 15 minutes       181

## 2014-11-02 NOTE — Progress Notes (Addendum)
Blood transfusion of 1 unit of PRBC started at [place] and ended at 1101. Total volume transfused 335 mL. VS checked at 1101 stable.  Blood infused within 4 hours of issue time.  No suspected reactions.  Kizzie Ide, RN   Second blood transfusion of 1 unit of PRBC started at 1129 and ended at 1424.  Total volume transfused 335 mL. VS checked at 1424 stable. Blood infused within 4 hours of issue time.  No suspected reactions. Kizzie Ide, RN

## 2014-11-02 NOTE — Progress Notes (Signed)
CRITICAL VALUE ALERT  Critical value received:  Hgb 6.7  Date of notification:  11/02/14  Time of notification:  0650  Critical value read back:Yes.    Nurse who received alert:  A. Tina Griffiths, RN  MD notified (1st page):  Fredirick Maudlin, NP  Time of first page:  [phone]  MD notified (2nd page):  Time of second page:  Responding MD:  Fredirick Maudlin, NP  Time MD responded:  [phone]

## 2014-11-02 NOTE — Progress Notes (Signed)
VASCULAR LAB PRELIMINARY  PRELIMINARY  PRELIMINARY  PRELIMINARY  Bilateral lower extremity venous duplex completed.    Preliminary report:  Bilateral:  No evidence of DVT, superficial thrombosis, or Baker's Cyst.   Vinicio Lynk, RVS 11/02/2014, 9:46 AM

## 2014-11-02 NOTE — Progress Notes (Addendum)
ANTICOAGULATION CONSULT NOTE -Follow up  Pharmacy Consult for Warfarin, Lovenox Indication: History of multiple PEs  No Known Allergies  Patient Measurements: Height: 5\' 3"  (160 cm) Weight: 154 lb 12.8 oz (70.217 kg) IBW/kg (Calculated) : 52.4  Vital Signs: Temp: 99.7 F (37.6 C) (11/29 0540) Temp Source: Oral (11/29 0540) BP: 110/65 mmHg (11/29 0540) Pulse Rate: 78 (11/29 0540)  Labs:  Recent Labs  10/31/14 0926  10/31/14 0942 10/31/14 1855 11/01/14 0534 11/01/14 1157 11/01/14 1807 11/02/14 0553  HGB  --   < > 7.2*  --  7.0*  --   --  6.7*  HCT  --   --  24.2*  --  23.8*  --   --  22.3*  PLT  --   --  269  --  261  --   --  273  LABPROT  --   --   --  15.7* 15.4*  --   --  16.9*  INR  --   --   --  1.24 1.21  --   --  1.36  CREATININE 0.89  --   --   --   --   --   --  0.90  TROPONINI  --   --   --   --  <0.30 <0.30 <0.30  --   < > = values in this interval not displayed.  Estimated Creatinine Clearance: 85.9 mL/min (by C-G formula based on Cr of 0.9).  Inpatient warfarin doses: 11/27: 5mg  11/28: 10mg  11/29  Assessment: 42 YOF presents to ED with symptoms consistent with UTI.  She was seen in ED 11/25 and sent home with rx for cephalexin but only able to take one dose d/t N/V.   Appears she was given samples of rivaroxaban but stopped in Feb 2015 and resumed warfarin.  Most recent home dose reported as 5mg  daily with subtherapeutic INR (1.24) on admission.  It does not appear she is compliant with follow-up (at least no longer with [facility]).  Her Hgb is noted to be 7.3 in ED, FOBT was negative.  Pharmacy is consulted to dose warfarin and Lovenox bridge.  CTA: negative for PE LE Venous Duplex: No evidence of DVT, superficial thrombosis, or Baker's Cyst.  Today, 11/02/2014   INR 1.36, remains subtherapeutic, but increasing.  CBC: Hgb decreased (7 > 6.7).  Plt WNL.    Transfuse 2 units PRBC 11/29 AM.  No active bleeding or complications  per RN.    MD notes normal hgb range is 8-10.  Anemia panel: total Iron < 10, ferrous sulfate started 11/28  SCr 0.9, CrCl ~ 85 ml/mn  Diet: advanced to regular  Drug interactions: no major interactions, follow antibiotic choices  Goal of Therapy:  INR 2-3 Monitor platelets by anticoagulation protocol: Yes   Plan:   Continue Lovenox 70mg  SQ q12 until therapeutic INR  Warfarin 10mg  PO once today at 1400  Repeat boosted dose today, but would monitor closely with decreased Hgb.  Daily INR, CBC  Follow up repeat H/H after transfusion.   Gretta Arab PharmD, BCPS Pager [phone] 11/02/2014 12:44 PM

## 2014-11-02 NOTE — Plan of Care (Signed)
Problem: Phase II Progression Outcomes Goal: Vital signs remain stable Outcome: Completed/Met Date Met:  11/02/14 Goal: Obtain order to discontinue catheter if appropriate Outcome: Not Applicable Date Met:  68/34/19

## 2014-11-03 ENCOUNTER — Telehealth (HOSPITAL_COMMUNITY): Payer: Self-pay

## 2014-11-03 LAB — TYPE AND SCREEN
ABO/RH(D): O POS
Antibody Screen: NEGATIVE
UNIT DIVISION: 0
Unit division: 0

## 2014-11-03 LAB — CBC
HCT: 28.5 % — ABNORMAL LOW (ref 36.0–46.0)
Hemoglobin: 9 g/dL — ABNORMAL LOW (ref 12.0–15.0)
MCH: 22.4 pg — AB (ref 26.0–34.0)
MCHC: 31.6 g/dL (ref 30.0–36.0)
MCV: 70.9 fL — ABNORMAL LOW (ref 78.0–100.0)
PLATELETS: 253 10*3/uL (ref 150–400)
RBC: 4.02 MIL/uL (ref 3.87–5.11)
RDW: 19.5 % — ABNORMAL HIGH (ref 11.5–15.5)
WBC: 4.7 10*3/uL (ref 4.0–10.5)

## 2014-11-03 LAB — I-STAT CG4 LACTIC ACID, ED: LACTIC ACID, VENOUS: 1.56 mmol/L (ref 0.5–2.2)

## 2014-11-03 LAB — PROTIME-INR
INR: 1.55 — ABNORMAL HIGH (ref 0.00–1.49)
Prothrombin Time: 18.7 seconds — ABNORMAL HIGH (ref 11.6–15.2)

## 2014-11-03 LAB — I-STAT TROPONIN, ED: Troponin i, poc: 0 ng/mL (ref 0.00–0.08)

## 2014-11-03 MED ORDER — POLYETHYLENE GLYCOL 3350 17 G PO PACK
17.0000 g | PACK | Freq: Two times a day (BID) | ORAL | Status: DC
  Administered 2014-11-03 – 2014-11-04 (×2): 17 g via ORAL
  Filled 2014-11-03 (×6): qty 1

## 2014-11-03 MED ORDER — SENNA 8.6 MG PO TABS
1.0000 | ORAL_TABLET | Freq: Every day | ORAL | Status: DC
  Administered 2014-11-03: 8.6 mg via ORAL
  Filled 2014-11-03 (×2): qty 1

## 2014-11-03 MED ORDER — WARFARIN SODIUM 7.5 MG PO TABS
7.5000 mg | ORAL_TABLET | Freq: Once | ORAL | Status: DC
  Filled 2014-11-03: qty 1

## 2014-11-03 MED ORDER — SODIUM CHLORIDE 0.9 % IV SOLN
INTRAVENOUS | Status: DC
  Administered 2014-11-03 – 2014-11-05 (×4): via INTRAVENOUS

## 2014-11-03 MED ORDER — CEFUROXIME AXETIL 500 MG PO TABS
500.0000 mg | ORAL_TABLET | Freq: Two times a day (BID) | ORAL | Status: DC
  Administered 2014-11-03 – 2014-11-06 (×6): 500 mg via ORAL
  Filled 2014-11-03 (×9): qty 1

## 2014-11-03 NOTE — Progress Notes (Addendum)
ANTICOAGULATION CONSULT NOTE -Follow up  Pharmacy Consult for Warfarin, Lovenox Indication: History of multiple PEs  No Known Allergies  Patient Measurements: Height: 5\' 3"  (160 cm) Weight: 154 lb 12.8 oz (70.217 kg) IBW/kg (Calculated) : 52.4  Vital Signs: Temp: 98.1 F (36.7 C) (11/30 0500) Temp Source: Oral (11/30 0500) BP: 128/79 mmHg (11/30 0500) Pulse Rate: 54 (11/30 0500)  Labs:  Recent Labs  11/01/14 0534 11/01/14 1157 11/01/14 1807 11/02/14 0553 11/02/14 1820 11/03/14 0520  HGB 7.0*  --   --  6.7* 9.3* 9.0*  HCT 23.8*  --   --  22.3* 29.3* 28.5*  PLT 261  --   --  273 250 253  LABPROT 15.4*  --   --  16.9*  --  18.7*  INR 1.21  --   --  1.36  --  1.55*  CREATININE  --   --   --  0.90  --   --   TROPONINI <0.30 <0.30 <0.30  --   --   --     Estimated Creatinine Clearance: 85.9 mL/min (by C-G formula based on Cr of 0.9).  Inpatient warfarin doses: 11/27: 5mg  11/28: 10mg  11/29: 10mg   Assessment: 66 YOF presents to ED with symptoms consistent with UTI.  She was seen in ED 11/25 and sent home with rx for cephalexin but only able to take one dose d/t N/V.   Appears she was given samples of rivaroxaban but stopped in Feb 2015 and resumed warfarin.  Most recent home dose reported as 5mg  daily with subtherapeutic INR (1.24) on admission.  It does not appear she is compliant with follow-up (at least no longer with [facility]).  Her Hgb is noted to be 7.3 in ED, FOBT was negative.  Pharmacy is consulted to dose warfarin and Lovenox bridge.  CTA: negative for PE LE Venous Duplex: No evidence of DVT, superficial thrombosis, or Baker's Cyst.  Today, 11/03/2014   INR 1.55, remains subtherapeutic, but increasing.  CBC: Hgb = 9 s/p 2 units PRBC 11/29. Platelet count WNL  No active bleeding or complications noted  MD notes normal hgb range is 8-10.  Anemia panel: total Iron < 10, ferrous sulfate started 11/28  SCr 0.9, CrCl ~ 85 ml/mn  Diet:  regular  Drug interactions: no major interactions, follow antibiotic choices  CTA 11/28 negative for acute PE  Goal of Therapy:  INR 2-3 Monitor platelets by anticoagulation protocol: Yes   Plan:   Continue Lovenox 70mg  SQ q12 until therapeutic INR  Warfarin 7.5mg  PO once today at 1800  Monitor INR closely with decreased Hgb.  Daily INR, CBC  Needs better follow-up at discharge for warfarin monitoring/dosing  Doreene Eland, PharmD, BCPS.   Pager: [phone] 11/03/2014 9:35 AM

## 2014-11-03 NOTE — Clinical Documentation Improvement (Signed)
Supporting Information: Has had a significant drop in hemoglobin per 11/27 progress notes. Suspect metrorrhagia per 11/28 progress notes. Iron deficiency anemia per 11/28 progress notes. Transfuse with PRBC per 11/29 progress notes.    Possible Diagnosis? . Documentation of Anemia should include the type of anemia: --Nutritional --Hemolytic --Aplastic --Acute blood loss anemia on iron deficiency anemia. --Other (please specify) . Include in documentation if Anemia is due to nutrition or mineral deficits, resulting in a nutritional anemia . Document if the Anemia is due to a neoplasm (primary and/or secondary) . Document whether the ANEMIA is "related to or due to" chemo or radiotherapy treatments . Document any "cause-and-effect" relationship between the intervention and the blood or immune disorder . Document the specific drug if anemia is drug-induced . Link any laboratory findings to a related diagnosis (if appropriate) . Document any associated diagnoses/conditions    Thank Sherian Maroon Documentation Specialist [phone] [email or web address]

## 2014-11-03 NOTE — Progress Notes (Signed)
CARE MANAGEMENT NOTE 11/03/2014  Patient:  Paula Benson, Paula Benson   Account Number:  [id number]  Date Initiated:  11/03/2014  Documentation initiated by:  Edwyna Shell  Subjective/Objective Assessment:   28 yo female admitted with acute pyelonephritis from home     Action/Plan:   discharge planning   Anticipated DC Date:  11/05/2014   Anticipated DC Plan:  [place]  CM consult  PCP issues      Choice offered to / List presented to:             Status of service:  In process, will continue to follow Medicare Important Message given?   (If response is "NO", the following Medicare IM given date fields will be blank) Date Medicare IM given:   Medicare IM given by:   Date Additional Medicare IM given:   Additional Medicare IM given by:    Discharge Disposition:  HOME/SELF CARE  Per UR Regulation:    If discussed at Long Length of Stay Meetings, dates discussed:    Comments:  11/03/14 Edwyna Shell RN BSN CM [phone] Patient stated she was established as a patient with Family Practice although she has not been active since she lost her insurance coverage. She stated that she will have insurance the 1st of Jan through her employer. Provided the patient with a self pay resouce guide and a pamphlet for the [facility] and encouraged follow up with a PCP

## 2014-11-03 NOTE — Progress Notes (Signed)
PROGRESS NOTE  Paula Benson GYB:[id number] DOB: 11/13/1986 DOA: 10/31/2014 PCP: Nobie Putnam, DO  Summary: 28 year old woman with history of pulmonary embolism maintained on warfarin presented to the emergency department 11/25 with fever. Diagnosed with pyelonephritis and discharged on Keflex. INR was low. She presented again to the emergency department 11/27 with ongoing left flank pain, urinary symptoms, fever, nausea and vomiting with inability to take oral medications. She was noted to be anemic, rectal exam no hemorrhoids, hemoccult negative stool. She was admitted for further evaluation of pyelonephritis and anemia. She was treated for pyelonephritis and community acquired pneumonia which is now improving. Plan transition oral antibiotics today. Anemia slightly worsened and she underwent transfusion with 2 units packed red blood cells. Hemoglobin appears stable at this time. She has iron deficiency anemia suspected to be related to menses. She has had no evidence of bleeding. Plan for empiric iron with outpatient follow-up. In regard to multiple PE, there is no evidence of recurrent venous thromboembolism at this time. She has been restarted on anticoagulation.  Assessment/Plan: 1. Acute Klebsiella pyelonephritis, now improving. Remains afebrile. 2. Community acquired pneumonia. Improving. No hypoxia. 3. Microcytic IDA . Status post blood transfusion. No active bleeding. Review of the record, hemoglobin range 8-10 with numerous data points going back to 2012. Hemoccult was negative and she has no history of bleeding other than menses which she reports is normal. Suspect metrorrhagia. Anemia panel this admission and previously admission consistent with iron deficiency anemia. Discussed with oncology, recommended iron supplementation, outpatient follow-up. 4. Multiple PE, last "about 2013". CTA chest negative 2014, 2013, 2012. Rx for warfarin but noncompliant for several weeks. CT  angiogram of the chest and bilateral lower extremity venous Dopplers negative this admission. 5. Bipolar 2 disorder   Overall much improved today. Plan to continue antibiotics, transition to oral in the morning.  Continue iron supplementation. Suggest close outpatient follow-up. Patient should have a GYN evaluation.  She will also need close outpatient follow-up for anticoagulation for history of multiple PE.  Anticipate discharge next 48 hours.  Code Status: full code DVT prophylaxis: Lovenox, warfarin Family Communication: none present Disposition Plan: home  Murray Hodgkins, MD  [facility]  Pager [phone] If 7PM-7AM, please contact night-coverage at [email or web address], password [facility] 11/03/2014, 6:00 PM  LOS: 3 days   Consultants:    Procedures:  Transfusion 2 units packed red blood cells 11/29  Antibiotics:  Ceftriaxone 11/27 >>  Zithromax 11/28 >>  HPI/Subjective: Overall feeling much better today. Appetite still poor with limited fluid intake but feeling much better. Breathing seems to be doing better. Chest pain seems to be resolving. Flank pain also improving. She hopes to go home in the next 1-2 days.  Objective: Filed Vitals:   11/02/14 1422 11/02/14 2217 11/03/14 0500 11/03/14 1349  BP: 101/86 99/54 128/79 131/89  Pulse: 77 73 54 68  Temp: 98.4 F (36.9 C) 98.2 F (36.8 C) 98.1 F (36.7 C) 97.8 F (36.6 C)  TempSrc: Oral Oral Oral Oral  Resp: 20 18 20 18   Height:      Weight:      SpO2: 100% 100% 98% 100%    Intake/Output Summary (Last 24 hours) at 11/03/14 1800 Last data filed at 11/03/14 1507  Gross per 24 hour  Intake    480 ml  Output    900 ml  Net   -420 ml     Filed Weights   11/01/14 0958  Weight: 70.217 kg (154 lb 12.8 oz)  Exam:     Afebrile, vital signs are stable. No hypoxia.  Alert. Speech fluent and clear. Appears calm, comfortable.  Cardiovascular regular rate and rhythm. No murmur, rub or  gallop.  Respiratory clear to auscultation bilaterally. No wheezes, rales or rhonchi. Normal respiratory effort.  Abdomen soft.  Data Reviewed:  Urine output 600   Hemoglobin stable status post transfusion, 9.0. INR 1.55.  Scheduled Meds: . acyclovir  400 mg Oral BID  . azithromycin  250 mg Oral Daily  . cefTRIAXone (ROCEPHIN)  IV  1 g Intravenous Q24H  . enoxaparin (LOVENOX) injection  70 mg Subcutaneous BID  . ferrous sulfate  325 mg Oral BID WC  . polyethylene glycol  17 g Oral BID  . senna  1 tablet Oral QHS  . warfarin  7.5 mg Oral ONCE-1800  . Warfarin - Pharmacist Dosing Inpatient   Does not apply q1800   Continuous Infusions: . sodium chloride 75 mL/hr at 11/03/14 1636    Principal Problem:   Acute pyelonephritis Active Problems:   History of pulmonary embolus (PE)   Chronic anticoagulation   Iron deficiency anemia   Hypokalemia   CAP (community acquired pneumonia)   Time spent 15 minutes       181

## 2014-11-03 NOTE — Telephone Encounter (Signed)
Post ED Visit - Positive Culture Follow-up  Culture report reviewed by antimicrobial stewardship pharmacist: []  Wes Dulaney, Pharm.D., BCPS []  Heide Guile, Pharm.D., BCPS []  Alycia Rossetti, Pharm.D., BCPS [x]  [place], [place].D., BCPS, AAHIVP []  Legrand Como, Pharm.D., BCPS, AAHIVP []  Elicia Lamp, Pharm.D.   Positive Urine culture Treated with Cephalexin, organism sensitive to the same and no further patient follow-up is required at this time.  Dortha Kern 11/03/2014, 6:14 AM

## 2014-11-03 NOTE — Plan of Care (Signed)
Problem: Phase I Progression Outcomes Goal: Hemodynamically stable Outcome: Completed/Met Date Met:  11/03/14 Goal: Other Phase I Outcomes/Goals Outcome: Completed/Met Date Met:  11/03/14  Problem: Phase II Progression Outcomes Goal: Progress activity as tolerated unless otherwise ordered Outcome: Completed/Met Date Met:  11/03/14     

## 2014-11-03 NOTE — Plan of Care (Signed)
Problem: Phase II Progression Outcomes Goal: IV changed to normal saline lock Outcome: Not Progressing 11/30 - pt having decreased uop

## 2014-11-04 ENCOUNTER — Encounter (HOSPITAL_COMMUNITY): Payer: Self-pay

## 2014-11-04 LAB — PROTIME-INR
INR: 1.76 — ABNORMAL HIGH (ref 0.00–1.49)
PROTHROMBIN TIME: 20.7 s — AB (ref 11.6–15.2)

## 2014-11-04 MED ORDER — ALUM & MAG HYDROXIDE-SIMETH 200-200-20 MG/5ML PO SUSP
20.0000 mL | Freq: Four times a day (QID) | ORAL | Status: DC | PRN
  Administered 2014-11-04: 20 mL via ORAL
  Filled 2014-11-04: qty 30

## 2014-11-04 MED ORDER — WARFARIN SODIUM 7.5 MG PO TABS
7.5000 mg | ORAL_TABLET | Freq: Once | ORAL | Status: AC
  Administered 2014-11-04: 7.5 mg via ORAL
  Filled 2014-11-04: qty 1

## 2014-11-04 NOTE — Progress Notes (Signed)
ANTICOAGULATION CONSULT NOTE -Follow up  Pharmacy Consult for Warfarin, Lovenox Indication: History of multiple PEs  No Known Allergies  Patient Measurements: Height: 5\' 3"  (160 cm) Weight: 154 lb 12.8 oz (70.217 kg) IBW/kg (Calculated) : 52.4  Vital Signs: Temp: 98 F (36.7 C) (12/01 0802) Temp Source: Oral (12/01 0802) BP: 143/85 mmHg (12/01 0458) Pulse Rate: 44 (12/01 0458)  Labs:  Recent Labs  11/01/14 1157 11/01/14 1807  11/02/14 0553 11/02/14 1820 11/03/14 0520 11/04/14 0457  HGB  --   --   < > 6.7* 9.3* 9.0*  --   HCT  --   --   --  22.3* 29.3* 28.5*  --   PLT  --   --   --  273 250 253  --   LABPROT  --   --   --  16.9*  --  18.7* 20.7*  INR  --   --   --  1.36  --  1.55* 1.76*  CREATININE  --   --   --  0.90  --   --   --   TROPONINI <0.30 <0.30  --   --   --   --   --   < > = values in this interval not displayed.  Estimated Creatinine Clearance: 85.9 mL/min (by C-G formula based on Cr of 0.9).  Inpatient warfarin doses: 11/27: 5mg  11/28: 10mg  11/29: 10mg  11/30: 7.5mg  (Dose not charted given or not given)  Assessment: 38 YOF presents to ED with symptoms consistent with UTI.  She was seen in ED 11/25 and sent home with rx for cephalexin but only able to take one dose d/t N/V.   Appears she was given samples of rivaroxaban but stopped in Feb 2015 and resumed warfarin.  Most recent home dose reported as 5mg  daily with subtherapeutic INR (1.24) on admission.  It does not appear she is compliant with follow-up (at least no longer with [facility]).  Her Hgb is noted to be 7.3 in ED, FOBT was negative.  Pharmacy is consulted to dose warfarin and Lovenox bridge.  CTA: negative for PE LE Venous Duplex: No evidence of DVT, superficial thrombosis, or Baker's Cyst.  Today, 11/04/2014   INR 1.76, remains subtherapeutic, but increasing toward goal. Dose not charted and patient not sure if given to her last pm  CBC: Hgb = 9 s/p 2 units PRBC 11/29.  Platelet count WNL  No active bleeding or complications noted  MD notes normal hgb range is 8-10.  Anemia panel: total Iron < 10, ferrous sulfate started 11/28  SCr 0.9, CrCl ~ 85 ml/mn  Diet: regular  Drug interactions: no major interactions  Klebsiella from 11/25 urine culture is R to amp only - started on PO ceftin  CTA 11/28 negative for acute PE  Goal of Therapy:  INR 2-3 Monitor platelets by anticoagulation protocol: Yes   Plan:   Continue Lovenox 70mg  SQ q12 until therapeutic INR  Warfarin 7.5mg  PO once tonight as unsure if last night dose given  Monitor INR closely with decreased Hgb.  Daily INR, CBC  Needs better follow-up at discharge for warfarin monitoring/dosing  For pyelonephritis will need 10-14 days of b-lactam therapy  At discharge: would send home with 5mg  tabs with instructions to take 7.5mg  (1.5tab)  daily until INR follow-up (recommend Friday at latest). S  She states she has 5mg  tab at home and her previous dose was 5mg  daily  I stressed importance of INR follow-up, etc.  Today, 12/1  Doreene Eland, PharmD, BCPS.   Pager: [phone] 11/04/2014 11:17 AM

## 2014-11-04 NOTE — Progress Notes (Signed)
PROGRESS NOTE  Paula Benson YQI:[id number] DOB: August 06, 1986 DOA: 10/31/2014 PCP: Nobie Putnam, DO  Summary: 28 year old woman with history of pulmonary embolism maintained on warfarin presented to the emergency department 11/25 with fever. Diagnosed with pyelonephritis and discharged on Keflex. INR was low. She presented again to the emergency department 11/27 with ongoing left flank pain, urinary symptoms, fever, nausea and vomiting with inability to take oral medications. She was noted to be anemic, rectal exam no hemorrhoids, hemoccult negative stool. She was admitted for further evaluation of pyelonephritis and anemia. She was treated for pyelonephritis and community acquired pneumonia which is now improving. Plan transition oral antibiotics today. Anemia slightly worsened and she underwent transfusion with 2 units packed red blood cells. Hemoglobin appears stable at this time. She has iron deficiency anemia suspected to be related to menses. She has had no evidence of bleeding. Plan for empiric iron with outpatient follow-up. In regard to multiple PE, there is no evidence of recurrent venous thromboembolism at this time. She has been restarted on anticoagulation.  Assessment/Plan: 1. Acute Klebsiella pyelonephritis, now improving. Remains afebrile. 2. Community acquired pneumonia. Improving. No hypoxia. 3. Microcytic anemia, secondary to iron deficiency, iron level < 10 . Status post blood transfusion. No active bleeding. Review of the record, hemoglobin range 8-10 with numerous data points going back to 2012. Hemoccult was negative and she has no history of bleeding other than menses which she reports is normal. Suspect metrorrhagia. Anemia panel this admission and previously admission consistent with iron deficiency anemia. Discussed with oncology, recommended iron supplementation, outpatient follow-up. Will need outpatient GYN evaluation. 4. 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 CTA chest  negative 2014, 2013, 2012. Rx for warfarin but noncompliant for several weeks. CT angiogram of the chest and bilateral lower extremity venous Dopplers negative this admission. 5. Bipolar 2 disorder- stable.    Code Status: full code DVT prophylaxis: Lovenox, warfarin Family Communication: none present Disposition Plan: home  [facility]  Pager [phone] If 7PM-7AM, please contact night-coverage at [email or web address], password [facility] 11/04/2014, 2:12 PM  LOS: 4 days   Consultants:    Procedures:  Transfusion 2 units packed red blood cells 11/29  Antibiotics:  Ceftriaxone 11/27 >>  Zithromax 11/28 >>  HPI/Subjective: Patient still complains of right side intermittent chest pain. Objective: Filed Vitals:   11/03/14 2054 11/04/14 0458 11/04/14 0802 11/04/14 1403  BP: 112/72 143/85  109/68  Pulse: 54 44  72  Temp: 98.3 F (36.8 C) 98.1 F (36.7 C) 98 F (36.7 C) 98.4 F (36.9 C)  TempSrc: Oral Oral Oral Oral  Resp: 20 16 15 16   Height:      Weight:      SpO2: 99% 100% 100% 100%    Intake/Output Summary (Last 24 hours) at 11/04/14 1412 Last data filed at 11/04/14 3295  Gross per 24 hour  Intake    420 ml  Output   1025 ml  Net   -605 ml     Filed Weights   11/01/14 0958  Weight: 70.217 kg (154 lb 12.8 oz)    Exam:    Physical Exam: Eyes: No icterus, extraocular muscles intact  Lungs: Normal respiratory effort, bilateral clear to auscultation, no crackles or wheezes.  Heart: Regular rate and rhythm, S1 and S2 normal, no murmurs, rubs auscultated Abdomen: BS normoactive,soft,nondistended,positive right CVA tenderness,no organomegaly Extremities: No pretibial edema, no erythema, no cyanosis, no clubbing Neuro : Alert and oriented to time, place and person, No focal deficits  Data Reviewed:  Urine output 600   Hemoglobin stable status post transfusion, 9.0. INR 1.55.  Scheduled Meds: . acyclovir  400 mg Oral BID  . azithromycin  250  mg Oral Daily  . cefUROXime  500 mg Oral BID WC  . enoxaparin (LOVENOX) injection  70 mg Subcutaneous BID  . ferrous sulfate  325 mg Oral BID WC  . polyethylene glycol  17 g Oral BID  . senna  1 tablet Oral QHS  . warfarin  7.5 mg Oral ONCE-1800  . Warfarin - Pharmacist Dosing Inpatient   Does not apply q1800   Continuous Infusions: . sodium chloride 75 mL/hr at 11/03/14 1636    Principal Problem:   Acute pyelonephritis Active Problems:   History of pulmonary embolus (PE)   Chronic anticoagulation   Iron deficiency anemia   Hypokalemia   CAP (community acquired pneumonia)   Time spent 25 minutes

## 2014-11-05 LAB — BASIC METABOLIC PANEL
Anion gap: 13 (ref 5–15)
BUN: 8 mg/dL (ref 6–23)
CO2: 22 meq/L (ref 19–32)
Calcium: 8.4 mg/dL (ref 8.4–10.5)
Chloride: 109 mEq/L (ref 96–112)
Creatinine, Ser: 1.03 mg/dL (ref 0.50–1.10)
GFR calc Af Amer: 84 mL/min — ABNORMAL LOW (ref >90)
GFR calc non Af Amer: 72 mL/min — ABNORMAL LOW (ref >90)
GLUCOSE: 91 mg/dL (ref 70–99)
POTASSIUM: 4 meq/L (ref 3.7–5.3)
SODIUM: 144 meq/L (ref 137–147)

## 2014-11-05 LAB — CBC WITH DIFFERENTIAL/PLATELET
Basophils Absolute: 0.1 10*3/uL (ref 0.0–0.1)
Basophils Relative: 1 % (ref 0–1)
EOS PCT: 3 % (ref 0–5)
Eosinophils Absolute: 0.1 10*3/uL (ref 0.0–0.7)
HEMATOCRIT: 29.4 % — AB (ref 36.0–46.0)
Hemoglobin: 9.1 g/dL — ABNORMAL LOW (ref 12.0–15.0)
LYMPHS ABS: 2.1 10*3/uL (ref 0.7–4.0)
Lymphocytes Relative: 43 % (ref 12–46)
MCH: 22.5 pg — ABNORMAL LOW (ref 26.0–34.0)
MCHC: 31 g/dL (ref 30.0–36.0)
MCV: 72.8 fL — AB (ref 78.0–100.0)
MONOS PCT: 6 % (ref 3–12)
Monocytes Absolute: 0.3 10*3/uL (ref 0.1–1.0)
NEUTROS ABS: 2.3 10*3/uL (ref 1.7–7.7)
Neutrophils Relative %: 47 % (ref 43–77)
Platelets: 298 10*3/uL (ref 150–400)
RBC: 4.04 MIL/uL (ref 3.87–5.11)
RDW: 21.1 % — ABNORMAL HIGH (ref 11.5–15.5)
WBC: 4.9 10*3/uL (ref 4.0–10.5)

## 2014-11-05 LAB — PROTIME-INR
INR: 1.91 — ABNORMAL HIGH (ref 0.00–1.49)
Prothrombin Time: 22 seconds — ABNORMAL HIGH (ref 11.6–15.2)

## 2014-11-05 MED ORDER — DOCUSATE SODIUM 100 MG PO CAPS
200.0000 mg | ORAL_CAPSULE | Freq: Every day | ORAL | Status: DC
  Filled 2014-11-05 (×2): qty 2

## 2014-11-05 MED ORDER — RIVAROXABAN 20 MG PO TABS
20.0000 mg | ORAL_TABLET | Freq: Every day | ORAL | Status: DC
  Administered 2014-11-05: 20 mg via ORAL
  Filled 2014-11-05 (×2): qty 1

## 2014-11-05 MED ORDER — SIMETHICONE 80 MG PO CHEW
160.0000 mg | CHEWABLE_TABLET | Freq: Four times a day (QID) | ORAL | Status: DC | PRN
  Administered 2014-11-05: 160 mg via ORAL
  Filled 2014-11-05 (×2): qty 2

## 2014-11-05 NOTE — Progress Notes (Signed)
CARE MANAGEMENT NOTE 11/05/2014  Patient:  Paula Benson, Paula Benson   Account Number:  [id number]  Date Initiated:  11/03/2014  Documentation initiated by:  Edwyna Shell  Subjective/Objective Assessment:   28 yo female admitted with acute pyelonephritis from home     Action/Plan:   discharge planning   Anticipated DC Date:  11/05/2014   Anticipated DC Plan:  [place]  CM consult  PCP issues  Medication Assistance      Choice offered to / List presented to:             Status of service:  Completed, signed off Medicare Important Message given?   (If response is "NO", the following Medicare IM given date fields will be blank) Date Medicare IM given:   Medicare IM given by:   Date Additional Medicare IM given:   Additional Medicare IM given by:    Discharge Disposition:  HOME/SELF CARE  Per UR Regulation:    If discussed at Long Length of Stay Meetings, dates discussed:    Comments:  11/05/14 Edwyna Shell RN BSN CM [phone] Patient was provided Xarelto starter pack by pharmacy, reviewed the 30 day voucher for self pay. Provided the patient with thre PAP application for continued affordability of Xarelto. Encouraged patient to make follow up appointment at the [facility], where she is already established. Will follow with patient.  11/03/14 Edwyna Shell RN BSN CM [phone] Patient stated she was established as a patient with Family Practice although she has not been active since she lost her insurance coverage. She stated that she will have insurance the 1st of Jan through her employer. Provided the patient with a self pay resouce guide and a pamphlet for the [facility] and encouraged follow up with a PCP

## 2014-11-05 NOTE — Progress Notes (Signed)
ANTICOAGULATION CONSULT NOTE -Follow up  Pharmacy Consult for rivaroxaban (Xarelto) Indication: History of recurrent pulmonary emboli  No Known Allergies  Patient Measurements: Height: 5\' 3"  (160 cm) Weight: 154 lb 12.8 oz (70.217 kg) IBW/kg (Calculated) : 52.4  Vital Signs: Temp: 98.6 F (37 C) (12/02 0603) Temp Source: Oral (12/02 0603) BP: 123/54 mmHg (12/02 0603) Pulse Rate: 48 (12/02 0603)  Labs:  Recent Labs  11/02/14 1820 11/03/14 0520 11/04/14 0457 11/05/14 0448  HGB 9.3* 9.0*  --  9.1*  HCT 29.3* 28.5*  --  29.4*  PLT 250 253  --  298  LABPROT  --  18.7* 20.7* 22.0*  INR  --  1.55* 1.76* 1.91*  CREATININE  --   --   --  1.03    Estimated Creatinine Clearance: 75 mL/min (by C-G formula based on Cr of 1.03).  Inpatient warfarin doses: 11/27: 5mg  11/28: 10mg  11/29: 10mg  11/30: 7.5mg  (Dose not charted given or not given) 12/1:   7.5mg   Assessment: 11 YOF presented to ED with symptoms consistent with UTI.  She was seen in ED 11/25 and sent home with rx for cephalexin but only able to take one dose d/t N/V.   Appears she was given samples of rivaroxaban but stopped in Feb 2015 and resumed warfarin.  Most recent home warfarin dose reported as 5mg  daily; INR subtherapeutic (1.24) on admission.  Pharmacy was consulted to assist with Lovenox and warfarin dosing.  CTA: negative for PE LE Venous Duplex: No evidence of DVT, superficial thrombosis, or Baker's Cyst.  Today, 11/05/2014   INR responding (1.91) but not yet therapeutic on warfarin.  Remains on Lovenox 70 mg SQ q12h until INR therapeutic.  SCr 1.03, CrCl 75 mL/min  Hgb low but stable.  Pltc remains WNL.  No bleeding reported in most recent notes.  S/P transfusion of 2 units PRBC this admission for iron deficiency anemia thought related to menses.  On oral iron therapy.  New orders received to transition anticoagulation from Lovenox/warfarin to rivaroxaban.  Last Lovenox dose was administered at  10:10 AM.   Goal of Therapy:  Full anticoagulation with rivaroxaban    Plan: 1. DC Lovenox and warfarin.   DC INR monitoring. 2. Begin Xarelto 20mg  daily with evening meal starting today.     --The usual initiation phase of 15mg  BID x 2 weeks should not be mandatory since no evidence of acute thrombosis. 3. Provide patient teaching. 4. Case management assistance with financial and access issues. 5. Follow clinical course while inpatient.  Clayburn Pert, PharmD, BCPS Pager: [phone] 11/05/2014  12:21 PM

## 2014-11-05 NOTE — Progress Notes (Signed)
PROGRESS NOTE  Paula Benson JME:[id number] DOB: 1986/08/29 DOA: 10/31/2014 PCP: Nobie Putnam, DO  Summary: 28 year old woman with history of pulmonary embolism previously on warfarin and at an time Xarelto who presented to the emergency department 11/25 with fever. Diagnosed with pyelonephritis and discharged on Keflex. INR was low. She presented again to the emergency department 11/27 with ongoing left flank pain, urinary symptoms, fever, nausea and vomiting with inability to take oral medications. She was noted to be anemic, rectal exam not reveal any hemorrhoids, hemoccult negative stool. She was admitted for further evaluation of pyelonephritis and anemia. She was also complaining of right upper chest pain which was pleuritic and underwent a CT of the chest-no PE noted but was noted to have a developing right upper lobe infiltratet. She was treated for pyelonephritis and community acquired pneumonia which is now improving.Transitioned oral antibiotics.   Anemia slightly worsened and she underwent transfusion with 2 units packed red blood cells. Hemoglobin appears stable at this time. She has iron deficiency anemia suspected to be related to menses. She has had no evidence of bleeding. Plan for empiric iron with outpatient follow-up. In regard to multiple PE, there is no evidence of recurrent venous thromboembolism at this time per venous Doppler performed on 11/29. She has been restarted on anticoagulation.  Assessment/Plan: 1. Acute Klebsiella pneumoniae pyelonephritis, now improving. Remains afebrile. Would recommend a total of 14 days of antibiotics-last day of cefuroxime will be 12/10. As she continues to have a poor oral intake and is requiring IV fluids, we'll continue to watch her in the hospital for another 24 hours. 2. Community acquired pneumonia. Improving. Fifth day of a Zithromax should be completed today-cefuroxime duration mentioned above  3. Microcytic anemia, secondary  to iron deficiency, iron level < 10 . Status post blood transfusion. No active bleeding. Review of the record, hemoglobin range 8-10 with numerous data points going back to 2012. Hemoccult was negative and she has no history of bleeding other than menses which she reports is normal. Suspect metrorrhagia. Anemia panel this admission and previously admission consistent with iron deficiency anemia. Dr Darrick Meigs discussed with oncology, recommended iron supplementation, outpatient follow-up. Will need outpatient GYN evaluation. Hemoglobin is remaining steady at 9. 4. 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 CTA chest negative 2014, 2013, 2012. Rx for warfarin but noncompliant for several weeks. CT angiogram of the chest and bilateral lower extremity venous Dopplers negative this admission. Since she has poor compliance, I'm hesitant to continue her on warfarin as she will likely not follow-up for INR checks. She does admit that it is expensive to return to the family practice office for follow-up appointments. We have transitioned her to Xarelto and therefore less office visits will be necessary-assistance with medication will be given and therefore cost will not be an issue. Of note she did receive Xarelto samples in the past but discontinued them. She should also be eligible for the [facility] card which will further assist with office co-pays. This has been discussed with her by case management. 5. Bipolar 2 disorder- stable.    Code Status: full code DVT prophylaxis: Lovenox, warfarin Family Communication: none present Disposition Plan: home  Debbe Odea, MD [facility]  Pager -  [email or web address], password [facility] 11/05/2014, 3:23 PM  LOS: 5 days   Consultants:    Procedures:  Transfusion 2 units packed red blood cells 11/29  Antibiotics: Antibiotics Given (last 72 hours)    Date/Time Action Medication Dose Rate   11/02/14 2156 Given  acyclovir (ZOVIRAX) 200 MG capsule 400 mg 400 mg    11/03/14 0951  Given   cefTRIAXone (ROCEPHIN) 1 g in dextrose 5 % 50 mL IVPB - Premix 1 g 100 mL/hr   11/03/14 2041 Given   cefUROXime (CEFTIN) tablet 500 mg 500 mg    11/03/14 2140 Given   acyclovir (ZOVIRAX) 200 MG capsule 400 mg 400 mg    11/04/14 0800 Given   cefUROXime (CEFTIN) tablet 500 mg 500 mg    11/04/14 1021 Given   acyclovir (ZOVIRAX) 200 MG capsule 400 mg 400 mg    11/04/14 1021 Given   azithromycin (ZITHROMAX) tablet 250 mg 250 mg    11/04/14 1731 Given   cefUROXime (CEFTIN) tablet 500 mg 500 mg    11/04/14 2113 Given   acyclovir (ZOVIRAX) 200 MG capsule 400 mg 400 mg    11/05/14 0737 Given   cefUROXime (CEFTIN) tablet 500 mg 500 mg    11/05/14 1009 Given   acyclovir (ZOVIRAX) 200 MG capsule 400 mg 400 mg    11/05/14 1009 Given   azithromycin (ZITHROMAX) tablet 250 mg 250 mg       HPI/Subjective: - right sided chest pain improving- poor PO intake. Flank pain still present but not severe.  Objective: Filed Vitals:   11/04/14 1403 11/04/14 2127 11/05/14 0603 11/05/14 1429  BP: 109/68 116/68 123/54 130/76  Pulse: 72 49 48 56  Temp: 98.4 F (36.9 C) 98.6 F (37 C) 98.6 F (37 C) 97.9 F (36.6 C)  TempSrc: Oral Oral Oral Oral  Resp: 16 16 16 18   Height:      Weight:      SpO2: 100% 100% 99% 100%   No intake or output data in the 24 hours ending 11/05/14 1523   Filed Weights   11/01/14 0958  Weight: 70.217 kg (154 lb 12.8 oz)    Exam:    Physical Exam: Eyes: No icterus, extraocular muscles intact  Lungs: Normal respiratory effort, bilateral clear to auscultation, no crackles or wheezes.  Heart: Regular rate and rhythm, S1 and S2 normal, no murmurs, rubs auscultated Abdomen: BS normoactive,soft,nondistended,positive right CVA tenderness,no organomegaly Extremities: No pretibial edema, no erythema, no cyanosis, no clubbing Neuro : Alert and oriented to time, place and person, No focal deficits   Data Reviewed:  Urine output 600   Hemoglobin stable status  post transfusion, 9.0. INR 1.55.  Scheduled Meds: . acyclovir  400 mg Oral BID  . cefUROXime  500 mg Oral BID WC  . docusate sodium  200 mg Oral QHS  . ferrous sulfate  325 mg Oral BID WC  . rivaroxaban  20 mg Oral Q supper   Continuous Infusions: . sodium chloride 75 mL/hr at 11/05/14 1015      Time spent 25 minutes

## 2014-11-05 NOTE — Progress Notes (Signed)
Provided and reviewed with patient the Xarelto teaching materials and a voucher card to cover the cost of the first prescription for a 30-supply.  Clayburn Pert, PharmD, BCPS Pager: [phone] 11/05/2014  1:28 PM

## 2014-11-06 DIAGNOSIS — E876 Hypokalemia: Secondary | ICD-10-CM

## 2014-11-06 MED ORDER — RIVAROXABAN (XARELTO) VTE STARTER PACK (15 & 20 MG): ORAL_TABLET | ORAL | Status: DC

## 2014-11-06 MED ORDER — FERROUS SULFATE 325 (65 FE) MG PO TABS: 325.0000 mg | ORAL_TABLET | Freq: Two times a day (BID) | ORAL | Status: DC

## 2014-11-06 MED ORDER — DSS 100 MG PO CAPS
200.0000 mg | ORAL_CAPSULE | Freq: Every day | ORAL | Status: DC
Start: 2014-11-06 — End: 2015-05-06

## 2014-11-06 MED ORDER — CEFUROXIME AXETIL 500 MG PO TABS: 500.0000 mg | ORAL_TABLET | Freq: Two times a day (BID) | ORAL | Status: DC

## 2014-11-06 MED ORDER — SENNA 8.6 MG PO TABS: 1.0000 | ORAL_TABLET | Freq: Every day | ORAL | Status: DC | PRN

## 2014-11-06 MED ORDER — PROMETHAZINE HCL 25 MG PO TABS: 25.0000 mg | ORAL_TABLET | Freq: Four times a day (QID) | ORAL | Status: DC | PRN

## 2014-11-06 NOTE — Progress Notes (Signed)
Patient was given copy of discharge instructions, but left prior to signing.  Patient verbalized an understanding of discharge instructions to Jeanie Cooks RN, and this nurse, and stated that she had no questions.  Durwin Nora RN

## 2014-11-06 NOTE — Progress Notes (Signed)
CARE MANAGEMENT NOTE 11/06/2014  Patient:  Paula Benson, Paula Benson   Account Number:  [id number]  Date Initiated:  11/03/2014  Documentation initiated by:  Edwyna Shell  Subjective/Objective Assessment:   28 yo female admitted with acute pyelonephritis from home     Action/Plan:   discharge planning   Anticipated DC Date:  11/05/2014   Anticipated DC Plan:  [place]  CM consult  PCP issues  Medication Assistance  MATCH Program      Choice offered to / List presented to:             Status of service:  Completed, signed off Medicare Important Message given?   (If response is "NO", the following Medicare IM given date fields will be blank) Date Medicare IM given:   Medicare IM given by:   Date Additional Medicare IM given:   Additional Medicare IM given by:    Discharge Disposition:  HOME/SELF CARE  Per UR Regulation:    If discussed at Long Length of Stay Meetings, dates discussed:    Comments:  11/06/14 Edwyna Shell RN BSN CM [phone] Patient stated that she was unable to afford the abx with coupon and chose to use the [facility] letter for affordability. Explained that the [facility] letter can only be used 1 time in a year and it lowers the copay for the medication to $3 and has to be use within 7 days of discharge. The patient also stated that she has not amde a follow up appointment at [facility] so this CM scheduled a follow up appointment for Dec 7th, Monday at 3:15 and discussed with jackie that the patient is dc'd on Xarelto and has the PAP application that she has been instructed to bring to the appointment for continued Xarelto. Patient verbalized understanding of above information and had no questions. Also confirmed with [facility] on [address], pt pharmacy of choice, that they have the Xarelto starter pack in place.  11/05/14 Edwyna Shell RN BSN CM [phone] Patient was provided Xarelto starter pack by pharmacy, reviewed the 30 day  voucher for self pay. Provided the patient with thre PAP application for continued affordability of Xarelto. Encouraged patient to make follow up appointment at the [facility], where she is already established. Will follow with patient.  11/03/14 Edwyna Shell RN BSN CM [phone] Patient stated she was established as a patient with Family Practice although she has not been active since she lost her insurance coverage. She stated that she will have insurance the 1st of Jan through her employer. Provided the patient with a self pay resouce guide and a pamphlet for the [facility] and encouraged follow up with a PCP

## 2014-11-06 NOTE — Discharge Summary (Addendum)
Physician Discharge Summary  Paula Benson [id number]:[id number] DOB: 1986/03/22 DOA: 10/31/2014  PCP: Nobie Putnam, DO  Admit date: 10/31/2014 Discharge date: 11/06/2014  Time spent: >45 minutes  Recommendations for Outpatient Follow-up:  1. Evaluate Iron deficiency anemia- may need Gyn eval  Discharge Condition: stable Diet recommendation: heart healthy  Discharge Diagnoses:  Principal Problem:   Acute pyelonephritis Active Problems:   CAP (community acquired pneumonia)   History of pulmonary embolus (PE)   Chronic anticoagulation   Iron deficiency anemia   Hypokalemia Bipolar disorder  History of present illness:  28 year old woman with history of pulmonary embolism previously on warfarin and at one time on Xarelto who presented to the emergency department 11/25 with fever. Diagnosed with pyelonephritis and discharged on Keflex.  She presented again to the emergency department 11/27 with ongoing left flank pain, urinary symptoms, fever, nausea and vomiting with inability to take oral medications. She was noted to be anemic, rectal exam did not reveal any hemorrhoids- hemoccult negative stool.   She was also complaining of right upper chest pain which was pleuritic and underwent a CT of the chest-no PE noted but was noted to have a developing right upper lobe infiltratet. She was treated for pyelonephritis and community acquired pneumonia which is now improving.Transitioned oral antibiotics.  Anemia slightly worsened (see below) and she underwent transfusion with 2 units packed red blood cells. Hemoglobin appears stable at this time. She has iron deficiency anemia suspected to be related to menses. She has had no evidence of bleeding. Plan for empiric iron with outpatient follow-up. In regard to multiple PE, there is no evidence of recurrent venous thromboembolism at this time per venous Doppler performed on 11/29 and CT scan mentioned above. She has been restarted on  anticoagulation.  Hospital Course:  1. Acute Klebsiella pneumoniae pyelonephritis, now improving. Remains afebrile. Would recommend a total of 14 days of antibiotics-last day of cefuroxime will be 12/10. Assistance with medications to be provided. See case management notes. 2. Community acquired pneumonia. Improving. Five days of a Zithromax completed -cefuroxime duration mentioned above  3. Microcytic anemia- Hb noted to drop from 8.3 on admission to 6.7 by 11/29- given 2 U PRBC brining Hb up to 9 where it has been stable for the past 3 days- no obvious bleeding- suspect drop was due to dilution from IVF- anemia is secondary to iron deficiency, iron level < 10 . Review of the record, hemoglobin range 8-10 with numerous data points going back to 2012. Hemoccult was negative and she has no history of bleeding other than menses which she reports is normal. Anemia panel this admission and previously admission consistent with iron deficiency anemia. Dr Darrick Meigs discussed with oncology, recommended iron supplementation, outpatient follow-up. Will need outpatient GYN evaluation. She is tolerating Iron supplements thus far. Reviewed side effects and management of constipation if it is to result.  4. H/o Multiple PE- last "about 2013". CTA chest negative 2014, 2013, 2012. She has a Rx for warfarin but noncompliant for several weeks. Does not go in for INR check due to cost issues. Presented with an INR of 1.21. CT angiogram of the chest (pleuritic chest pain) and bilateral lower extremity venous Dopplers negative this admission. Since she has poor compliance, I'm hesitant to continue her on warfarin as she will likely not follow-up for INR checks. She does admit that it is expensive to return to the family practice office for follow-up appointments. We have transitioned her to Xarelto and therefore less office visits will  be necessary-assistance with medication will be given and therefore cost will not be an issue. Of  note she did receive Xarelto samples in the past but discontinued them based upon noted from [facility] clinic. She should also be eligible for the [facility] card which will further assist with office co-pays. This has been discussed with her by case management. F/u appt made with Family practice.  5. Bipolar disorder- stable.     Procedures:  stable  Consultations:  none  Discharge Exam: Filed Weights   11/01/14 0958  Weight: 70.217 kg (154 lb 12.8 oz)   Filed Vitals:   11/06/14 0523  BP: 122/76  Pulse: 52  Temp: 98.5 F (36.9 C)  Resp: 16    General: AAO x 3, no distress Cardiovascular: RRR, no murmurs  Respiratory: clear to auscultation bilaterally GI: soft, mild tenderness in RLQ, non-distended, bowel sound positive  Discharge Instructions You were cared for by a hospitalist during your hospital stay. If you have any questions about your discharge medications or the care you received while you were in the hospital after you are discharged, you can call the unit and asked to speak with the hospitalist on call if the hospitalist that took care of you is not available. Once you are discharged, your primary care physician will handle any further medical issues. Please note that NO REFILLS for any discharge medications will be authorized once you are discharged, as it is imperative that you return to your primary care physician (or establish a relationship with a primary care physician if you do not have one) for your aftercare needs so that they can reassess your need for medications and monitor your lab values.     Medication List    STOP taking these medications        cephALEXin 500 MG capsule  Commonly known as:  KEFLEX     fluconazole 150 MG tablet  Commonly known as:  DIFLUCAN     warfarin 5 MG tablet  Commonly known as:  COUMADIN      TAKE these medications        acyclovir 200 MG capsule  Commonly known as:  ZOVIRAX  Take 2 capsules (400 mg total) by mouth 2 (two)  times daily.     albuterol 108 (90 BASE) MCG/ACT inhaler  Commonly known as:  VENTOLIN HFA  Inhale 2 puffs into the lungs every 4 (four) hours as needed for wheezing or shortness of breath. For dyspnea.     ASPIRIN PO  Take 4 tablets by mouth as needed (fever and pain).     cefUROXime 500 MG tablet  Commonly known as:  CEFTIN  Take 1 tablet (500 mg total) by mouth 2 (two) times daily with a meal.     DSS 100 MG Caps  Take 200 mg by mouth at bedtime.     ferrous sulfate 325 (65 FE) MG tablet  Take 1 tablet (325 mg total) by mouth 2 (two) times daily with a meal.     HYDROcodone-acetaminophen 5-325 MG per tablet  Commonly known as:  NORCO/VICODIN  Take 1 tablet by mouth every 6 (six) hours as needed for severe pain.     promethazine 25 MG tablet  Commonly known as:  PHENERGAN  Take 1 tablet (25 mg total) by mouth every 6 (six) hours as needed for nausea or vomiting.     Rivaroxaban 15 & 20 MG Tbpk  Commonly known as:  XARELTO STARTER PACK  Take as directed on  package: Start with one 15mg  tablet by mouth twice a day with food. On Day 22, switch to one 20mg  tablet once a day with food.     senna 8.6 MG Tabs tablet  Commonly known as:  SENOKOT  Take 1 tablet (8.6 mg total) by mouth daily as needed for mild constipation.     SUMAtriptan 25 MG tablet  Commonly known as:  IMITREX  Take 1 tablet at start of migraine headache. If it is not resolved, you may take a 2nd tablet 2 hours later.       No Known Allergies Follow-up Information    Follow up with [facility] On 11/10/2014.   Why:  appointment 3:15. Bring Xarelto assistance application and discharge paper work   Sport and exercise psychologist information:   [address] [place] [address]        The results of significant diagnostics from this hospitalization (including imaging, microbiology, ancillary and laboratory) are listed below for reference.    Significant Diagnostic Studies: Ct Angio Chest Pe W/cm  &/or Wo Cm  11/01/2014   CLINICAL DATA:  Chest pain.  EXAM: CT ANGIOGRAPHY CHEST WITH CONTRAST  TECHNIQUE: Multidetector CT imaging of the chest was performed using the standard protocol during bolus administration of intravenous contrast. Multiplanar CT image reconstructions and MIPs were obtained to evaluate the vascular anatomy.  CONTRAST:  146mL OMNIPAQUE IOHEXOL 350 MG/ML SOLN  COMPARISON:  CT scan of February 16, 2012.  FINDINGS: No pneumothorax is noted. Minimal right pleural effusion is noted with adjacent subsegmental atelectasis. Mild ill-defined airspace opacity is noted in right upper lobe concerning for possible early pneumonia. There is no evidence of pulmonary embolus. No mediastinal mass or adenopathy is noted. Visualized portion of upper abdomen appears normal. No significant osseous abnormality is noted in the chest. Thoracic aorta appears grossly normal.  Review of the MIP images confirms the above findings.  IMPRESSION: No evidence of pulmonary embolus.  Minimal right pleural effusion is noted with adjacent subsegmental atelectasis.  Ill-defined airspace opacity is noted in the right upper lobe concerning for early pneumonia.   Electronically Signed   By: Sabino Dick M.D.   On: 11/01/2014 15:16   US Renal  10/31/2014   CLINICAL DATA:  Left flank pain.  EXAM: RENAL/URINARY TRACT ULTRASOUND COMPLETE  COMPARISON:  None.  FINDINGS: Right Kidney:  Length: 11.2 cm. Echogenicity within normal limits. No mass or hydronephrosis visualized.  Left Kidney:  Length: 11.0 cm. Echogenicity within normal limits. No mass or hydronephrosis visualized.  Bladder:  Appears normal for degree of bladder distention.  IMPRESSION: Normal renal ultrasound.   Electronically Signed   By: Marin Olp M.D.   On: 10/31/2014 15:37   Dg Chest Port 1 View  (if Code Sepsis Called)  10/29/2014   CLINICAL DATA:  Fevers.  Chill and chest pain.  Some SOB.  EXAM: PORTABLE CHEST - 1 VIEW  COMPARISON:  07/31/2013.  FINDINGS: The  heart size and mediastinal contours are within normal limits. Both lungs are clear. The visualized skeletal structures are unremarkable.  IMPRESSION: No active disease.   Electronically Signed   By: Kerby Moors M.D.   On: 10/29/2014 20:31    Microbiology: Recent Results (from the past 240 hour(s))  Urine culture     Status: None   Collection Time: 10/29/14 11:09 PM  Result Value Ref Range Status   Specimen Description URINE, RANDOM  Final   Special Requests NONE  Final   Culture  Setup  Time   Final    10/30/2014 05:46 Performed at [facility]   Final    >=100,000 COLONIES/ML Performed at [facility] Performed at [facility]    Report Status 11/01/2014 FINAL  Final   Organism ID, Bacteria KLEBSIELLA PNEUMONIAE  Final      Susceptibility   Klebsiella pneumoniae - MIC*    AMPICILLIN RESISTANT      CEFAZOLIN <=4 SENSITIVE Sensitive     CEFTRIAXONE <=1 SENSITIVE Sensitive     CIPROFLOXACIN <=0.25 SENSITIVE Sensitive     GENTAMICIN <=1 SENSITIVE Sensitive     LEVOFLOXACIN <=0.12 SENSITIVE Sensitive     NITROFURANTOIN <=16 SENSITIVE Sensitive     TOBRAMYCIN <=1 SENSITIVE Sensitive     TRIMETH/SULFA <=20 SENSITIVE Sensitive     PIP/TAZO <=4 SENSITIVE Sensitive     * KLEBSIELLA PNEUMONIAE     Labs: Basic Metabolic Panel:  Recent Labs Lab 10/31/14 0926 10/31/14 1547 11/02/14 0553 11/05/14 0448  NA 135*  --  138 144  K 3.6*  --  3.7 4.0  CL 100  --  103 109  CO2 21  --  23 22  GLUCOSE 91  --  95 91  BUN 9  --  6 8  CREATININE 0.89  --  0.90 1.03  CALCIUM 8.9  --  8.6 8.4  MG  --  1.7 1.8  --    Liver Function Tests:  Recent Labs Lab 10/31/14 1547  AST 18  ALT 11  ALKPHOS 50  BILITOT 0.4  PROT 7.4  ALBUMIN 3.1*    Recent Labs Lab 10/31/14 1547  LIPASE 20   No results for input(s): AMMONIA in the last 168 hours. CBC:  Recent Labs Lab 10/31/14 0942  11/01/14 0534 11/02/14 0553 11/02/14 1820 11/03/14 0520 11/05/14 0448  WBC 8.6 5.7 4.8 5.3 4.7 4.9  NEUTROABS 6.6  --   --   --   --  2.3  HGB 7.2* 7.0* 6.7* 9.3* 9.0* 9.1*  HCT 24.2* 23.8* 22.3* 29.3* 28.5* 29.4*  MCV 69.5* 70.0* 67.8* 71.1* 70.9* 72.8*  PLT 269 261 273 250 253 298   Cardiac Enzymes:  Recent Labs Lab 11/01/14 0534 11/01/14 1157 11/01/14 1807  TROPONINI <0.30 <0.30 <0.30   BNP: BNP (last 3 results) No results for input(s): PROBNP in the last 8760 hours. CBG: No results for input(s): GLUCAP in the last 168 hours.     SignedDebbe Odea, MD [facility] 11/06/2014, 11:39 AM

## 2014-11-06 NOTE — Discharge Instructions (Signed)
Information on my medicine - XARELTO (rivaroxaban)  This medication education was reviewed with me or my healthcare representative as part of my discharge preparation.  The pharmacist that spoke with me during my hospital stay was:  Minda Ditto, [place]? Xarelto was prescribed to treat blood clots that may have been found in the veins of your legs (deep vein thrombosis) or in your lungs (pulmonary embolism) and to reduce the risk of them occurring again.  What do you need to know about Xarelto? You are on one 20 mg tablet taken ONCE A DAY with your evening meal.  DO NOT stop taking Xarelto without talking to the health care provider who prescribed the medication.    After discharge, you should have regular check-up appointments with your healthcare provider that is prescribing your Xarelto.  In the future your dose may need to be changed if your kidney function changes by a significant amount.  What do you do if you miss a dose? If you are taking Xarelto ONCE DAILY and you miss a dose, take it as soon as you remember on the same day then continue your regularly scheduled once daily regimen the next day. Do not take two doses of Xarelto at the same time.   Important Safety Information Xarelto is a blood thinner medicine that can cause bleeding. You should call your healthcare provider right away if you experience any of the following: ? Bleeding from an injury or your nose that does not stop. ? Unusual colored urine (red or dark brown) or unusual colored stools (red or black). ? Unusual bruising for unknown reasons. ? A serious fall or if you hit your head (even if there is no bleeding).  Some medicines may interact with Xarelto and might increase your risk of bleeding while on Xarelto. To help avoid this, consult your healthcare provider or pharmacist prior to using any new prescription or non-prescription medications, including herbals, vitamins,  non-steroidal anti-inflammatory drugs (NSAIDs) and supplements. We discussed Ibuprofen for headache that can increase your risk of bleed, considering alternating with Tylenol, or stomach protection with OTC Pepcid or Omeprazole.  This website has more information on Xarelto: [email or web address]

## 2014-11-10 ENCOUNTER — Ambulatory Visit (INDEPENDENT_AMBULATORY_CARE_PROVIDER_SITE_OTHER): Payer: Self-pay | Admitting: Family Medicine

## 2014-11-10 ENCOUNTER — Encounter: Payer: Self-pay | Admitting: Family Medicine

## 2014-11-10 VITALS — BP 133/77 | HR 92 | Temp 98.6°F | Ht 63.0 in | Wt 161.4 lb

## 2014-11-10 DIAGNOSIS — N10 Acute tubulo-interstitial nephritis: Secondary | ICD-10-CM

## 2014-11-10 DIAGNOSIS — Z86711 Personal history of pulmonary embolism: Secondary | ICD-10-CM

## 2014-11-10 DIAGNOSIS — D509 Iron deficiency anemia, unspecified: Secondary | ICD-10-CM

## 2014-11-10 DIAGNOSIS — J189 Pneumonia, unspecified organism: Secondary | ICD-10-CM

## 2014-11-10 LAB — POCT HEMOGLOBIN: Hemoglobin: 8.9 g/dL — AB (ref 12.2–16.2)

## 2014-11-10 NOTE — Assessment & Plan Note (Signed)
Tolerating by mouth iron Hemoglobin stable at 8.9, no obvious source of bleeding States that after her recent anemia she's had a heavier period. Follow-up with PCP, continue iron

## 2014-11-10 NOTE — Progress Notes (Signed)
Patient ID: Paula Benson, female   DOB: 03-09-1986, 28 y.o.   MRN: [id number]   HPI  Patient presents today for hospital follow-up  Patient was admitted on November 27 with acute pyelonephritis and  Sequentially found to have required pneumonia as well. She is treated with IV antibiotics and physician to oral with good tolerance.  pyelonephritis Since leaving the hospital she's had continued poor appetite but is tolerating by mouth food and fluid easily, she does report adequate intake She denies dysuria, abdominal pain, fever.  CAP She continues to have very slight  Dyspnea and right-sided chest discomfort with inspiration She states that her chest pain was originally right-sided chest heaviness which  Dramatically improved from her admission.  Anemia She was transfused 2 units in the hospital after reaching a low hemoglobin of 6.7 She has no clear history of bleeding states that this is her second transfusion in her life, the other being 2 years ago She denies any recurrent or clear sources of bleeding She has had one heavy period since leaving the hospital but describes it as using 5 and pons per day lasting 5 days now.   She brings in a family paperwork and paperwork for medication assistance for Xarelto today. I filled out a family paperwork for her hospitalization and advised her to take another form to her PCP for recurrent HSV outbreaks  I advised her to go to the map program for her application for medication assistance. . Smoking status noted ROS: Per HPI  Objective: BP 133/77 mmHg  Pulse 92  Temp(Src) 98.6 F (37 C) (Oral)  Ht 5\' 3"  (1.6 m)  Wt 161 lb 6.4 oz (73.211 kg)  BMI 28.60 kg/m2  LMP 11/06/2014 Gen: NAD, alert, cooperative with exam HEENT: NCAT CV: RRR, good S1/S2, no murmur Resp: CTABL, no wheezes, non-labored Abd: positive BS, no CVA tenderness Ext: No edema, warm Neuro: Alert and oriented, No gross deficits  Assessment and plan:  Iron  deficiency anemia Tolerating by mouth iron Hemoglobin stable at 8.9, no obvious source of bleeding States that after her recent anemia she's had a heavier period. Follow-up with PCP, continue iron  CAP (community acquired pneumonia) Dyspnea almost resolved and chest pain now described as more of a discomfort with deep inspiration Pain similar to that when she was hospitalized Still has 3 days of antibiotics to go, continue Follow-up with PCP as needed  Acute pyelonephritis Symptoms resolved Urine culture from hospitalization shows pansensitive Klebsiella pneumonia Discharged on 14 days of cefuroxime, 3 days left  History of pulmonary embolus (PE) History of 3 PEs Hospitalized transitioned from warfarin to Xarelto, recommending going to the map program for medication assistance for Xarelto Would offer samples temporarily if she is unable to get the medicine soon Has 30 day supply at home Chest pain today is resolving from her hospitalization which was attributed to pneumonia No clear etiology of recurrent PEs.     Orders Placed This Encounter  Procedures  . Hemoglobin

## 2014-11-10 NOTE — Assessment & Plan Note (Signed)
Symptoms resolved Urine culture from hospitalization shows pansensitive Klebsiella pneumonia Discharged on 14 days of cefuroxime, 3 days left

## 2014-11-10 NOTE — Patient Instructions (Signed)
Great to meet you!  Please leave the other FMLA paperwork for your PCP  Please make a follow up appt with him next month.

## 2014-11-10 NOTE — Assessment & Plan Note (Signed)
Dyspnea almost resolved and chest pain now described as more of a discomfort with deep inspiration Pain similar to that when she was hospitalized Still has 3 days of antibiotics to go, continue Follow-up with PCP as needed

## 2014-11-10 NOTE — Assessment & Plan Note (Signed)
History of 3 PEs Hospitalized transitioned from warfarin to Xarelto, recommending going to the map program for medication assistance for Xarelto Would offer samples temporarily if she is unable to get the medicine soon Has 30 day supply at home Chest pain today is resolving from her hospitalization which was attributed to pneumonia No clear etiology of recurrent PEs.

## 2014-11-18 ENCOUNTER — Telehealth: Payer: Self-pay | Admitting: Family Medicine

## 2014-11-18 NOTE — Telephone Encounter (Signed)
Pt called and would like to speak to Dr. Parks Ranger concerning Xarelto and the side effects that she is having. Please call to discuss. jw

## 2014-11-18 NOTE — Telephone Encounter (Signed)
Reviewed chart, recently hospitalized due to Pyelonephritis and CAP, also transitioned from Coumadin to Xarelto given history of recurrent PEs and uncontrolled INR on Coumadin (not taking regularly). Patient reports she has been on Xarelto x 2 weeks, and reports the following symptoms: weight gain hospitalized wt 154 lbs now up to 165 lbs (states she had previously lost wt and was not low wt due to illness), reporting muscle aching in legs and abdomen, and increased bleeding with last menstrual cycle (normal cycle lasts 4 days) with moderate-heavier bleeding x 8 days.  Discussed that overall benefit of chronic anti-coagulation to reduce risk of recurrent PE outweighs risk of current side-effects. Unclear on Xarelto's effect on wt gain and this may be due to other factors, would discuss with Dr. Valentina Lucks for further information. Otherwise, myalgias can be expected on Xarelto anticipate to improve gradually, and increased bleeding with menstrual cycle can be expected as well, does not seem like excessive bleeding to the degree to cause any anemia or symptomatic concerns, anticipate may need to increase iron supplement in future.  Advised patient to monitor side-effects, continue Xarelto at this time, and follow-up as scheduled on 12/02/14 for 2 week f/u to check on side-effects.  Per Dr. Valentina Lucks, there should be no specific side-effect of weight gain with Xarelto and this is most likely due to other causes. If symptoms persist and patient unable to tolerate Xarelto after continued trial, then consider switching to alternative anticoagulants such as Eliquis or Pradaxa.  Will call to f/u with patient via phone within next 2-3 days.  Nobie Putnam, [place], PGY-2

## 2014-11-21 ENCOUNTER — Telehealth: Payer: Self-pay | Admitting: Family Medicine

## 2014-11-21 NOTE — Telephone Encounter (Signed)
Called Paula Benson to follow-up from recent symptoms. Spoke with patient, she reported that she seems to be slightly worse and overall not improving. Continues to take Xarelto (has not missed any doses). She reported that she did have a nosebleed yesterday (seemed to be unprovoked) lasted < 20 mins, since resolved. Regarding the muscle aches in her legs, states that these have continued and seem to mostly occur at night only, Right > Left leg, aching pain described in entire leg, not localized to calf, and not associated with any redness or swelling, does not seem to be exertional or worse with any activity, episodes last about 1 hour then resolve without any treatment, also states the muscle aches feel like they are in her tailbone as well at times. Further ROS, admits to some persistent pleuritic chest pain only with very deep breathing (same symptom she had in the hospital when dx with PNA, does not seem to have completely resolved, but has improved), otherwise denies any acute chest pain or tightness. Regarding her weight, she has not checked her weight since we last talked, but states that she does feel "bloated" mostly around her abdomen.  After the above discussion, I advised patient that overall our goal is to continue anticoagulation and it seems unlikely that all of these symptoms are caused by Xarelto, but there are several alternative options (Eliquis, Pradaxa) to consider in the future if she does seem to be intolerant of Xarelto. Important thing is to continue Xarelto and not miss a single dose to inc risk of DVT/PE. Advised to continue to monitor symptoms, if any significant worsening urged to seek more immediate medical attention in ED for red flags, otherwise may try to schedule sooner appointment to be seen before 12/02/14.  Nobie Putnam, [place], PGY-2

## 2014-12-02 ENCOUNTER — Encounter: Payer: Self-pay | Admitting: Family Medicine

## 2014-12-02 ENCOUNTER — Ambulatory Visit (INDEPENDENT_AMBULATORY_CARE_PROVIDER_SITE_OTHER): Payer: Self-pay | Admitting: Family Medicine

## 2014-12-02 VITALS — BP 115/79 | HR 85 | Temp 98.7°F | Ht 63.0 in | Wt 162.0 lb

## 2014-12-02 DIAGNOSIS — Z7901 Long term (current) use of anticoagulants: Secondary | ICD-10-CM

## 2014-12-02 DIAGNOSIS — Z3009 Encounter for other general counseling and advice on contraception: Secondary | ICD-10-CM

## 2014-12-02 DIAGNOSIS — M545 Low back pain, unspecified: Secondary | ICD-10-CM | POA: Insufficient documentation

## 2014-12-02 DIAGNOSIS — D509 Iron deficiency anemia, unspecified: Secondary | ICD-10-CM

## 2014-12-02 DIAGNOSIS — E876 Hypokalemia: Secondary | ICD-10-CM

## 2014-12-02 DIAGNOSIS — Z309 Encounter for contraceptive management, unspecified: Secondary | ICD-10-CM

## 2014-12-02 DIAGNOSIS — N390 Urinary tract infection, site not specified: Secondary | ICD-10-CM

## 2014-12-02 DIAGNOSIS — R079 Chest pain, unspecified: Secondary | ICD-10-CM

## 2014-12-02 LAB — POCT URINALYSIS DIPSTICK
Bilirubin, UA: NEGATIVE
Glucose, UA: NEGATIVE
Ketones, UA: NEGATIVE
NITRITE UA: NEGATIVE
PH UA: 5.5
Protein, UA: NEGATIVE
RBC UA: NEGATIVE
Spec Grav, UA: 1.025
Urobilinogen, UA: 0.2

## 2014-12-02 LAB — BASIC METABOLIC PANEL WITH GFR
BUN: 8 mg/dL (ref 6–23)
CO2: 23 meq/L (ref 19–32)
Calcium: 9 mg/dL (ref 8.4–10.5)
Chloride: 104 meq/L (ref 96–112)
Creat: 0.83 mg/dL (ref 0.50–1.10)
Glucose, Bld: 78 mg/dL (ref 70–99)
Potassium: 3.9 meq/L (ref 3.5–5.3)
Sodium: 136 meq/L (ref 135–145)

## 2014-12-02 LAB — POCT UA - MICROSCOPIC ONLY

## 2014-12-02 LAB — POCT URINE PREGNANCY: PREG TEST UR: NEGATIVE

## 2014-12-02 MED ORDER — APIXABAN 2.5 MG PO TABS: 2.5000 mg | ORAL_TABLET | Freq: Two times a day (BID) | ORAL | Status: DC

## 2014-12-02 NOTE — Progress Notes (Signed)
Subjective:    Patient ID: Ed Blalock, female    DOB: 1986/10/15, 28 y.o.   MRN: [id number]  HPI  Recent Hospitalization: (11/27 to 12/3) on hospitalist service for Acute Pyelonephritis and found to have CAP. Initially presented with Left LBP, fevers, and R-side chest pain. Treated with abx transitioned to PO Cefuroxime x 14 days for CAP and pansensitive klebsiella pyelo.  CHEST PAIN / CHRONIC ANTICOAGULATION / RECURRENT PE's: - Significant past hx chronic anticoagulation due to recurrent PEs x 3 in past (per patient seem to have been provoked, long hospitalization and pregnancy),previous hypercoagulable work-up negative. - Previously on Xarelto (unable to afford and compliance issues) also period on Coumadin (non-adherence to regular INR f/u). During recent hospitalization, pt was resumed on Coumadin and bridged to Xarelto for continued PE/DVT prophylaxis - Chest CTA 11/01/14 (negative PE, identified RUL opacity / CAP) - Follow-up today regarding symptoms since hospitalization, concerns side-effects of Xarelto (see telephone notes dated 12/15 and 12/18). States "Xarelto is ruining my life", describes multiple pains, chest pain / pressure intermittent Right-chest wall and substernal does not seem provoked by activity, occurs at various times, reproducible to touch and pleuritic component with deep breathing, very similar pain experienced while in hospital, does not seem to be improving. - Currently out of Xarelto x 48 hours (was taking sample dosepak x 30 days), was waiting on MAP assistance - Other side-effects: admits to generalized bloating, subjective weight gain, and increased bleeding (x 1 episode epistaxis self resolved, and last menstrual cycle with heavy period lasting 7 days instead of 4) - Denies any SOB or DOE, palpitations, pre-syncope, hemoptysis, cough  RECENT PYELONEPHRITIS / LEFT LBP: - Reports bilateral lower back pain (L>R), worse at night, described as similar to pain  with pyelonephritis that sent her to hospital 11/27, except now afebrile and no urinary symptoms. - Completed Cefuroxime x 14 day course. Tried Vicodin without relief. - Reports hx chronic UTIs (sometimes with less severe symptoms) - Admits to urinary frequency and mild nausea w/o vomiting, improved with phenergan - Denies any fever/chills, dysuria, hematuria, urinary odor  BILATERAL LOWER EXT PAIN: - Reported symptoms bilateral lower ext pain (thighs down to feet) x about 1 month since starting Xarelto in hospital, pain gradually worsening now with constant pain daily without significant relief, some limitation on ambulation due to pain. Currently Right leg is worse, previously pain in Left leg. - Tried Vicodin without relief - Denies any numbness, tingling in lower ext, bowel or urinary incontinence, lower ext edema, tender calves  IRON DEFICIENCY ANEMIA / WEAKNESS: - Requesting labs to check iron levels, during hospitalization Hgb down to 6.7 required transfusion PRBC x 2 units, at discharge >9 - Admits to generalized weakness - Denies any fatigue or DOE, rectal bleeding or melena, abdominal pain, further epistaxis  I have reviewed and updated the following as appropriate: allergies and current medications  Social Hx: - Never smoker  Review of Systems  See above HPI    Objective:   Physical Exam  BP 115/79 mmHg  Pulse 85  Temp(Src) 98.7 F (37.1 C) (Oral)  Ht 5\' 3"  (1.6 m)  Wt 162 lb (73.483 kg)  BMI 28.70 kg/m2  LMP 11/06/2014 (Exact Date)  Gen - well-appearing, comfortable and cooperative, NAD HEENT - NCAT, PERRL, EOMI, patent nares w/o congestion or active bleed, oropharynx clear, MMM Neck - supple, non-tender Heart - RRR, no murmurs heard Lungs - CTAB, no wheezing, crackles, or rhonchi. Mildly decreased resp effort due to chest  pain with deep inspiration. MSK - back non-tender to palpation over spine, bilateral Lumbar paraspinal muscle hypertrophy with spasm and  tenderness L>R to palpation. Discomfort but no significant CVAT. Ext - non-tender calves, no edema, peripheral pulses intact +2 b/l Skin - warm, dry Neuro - awake, alert, grossly non-focal, intact muscle strength 5/5 b/l grip, knee flex, ankles, intact distal sensation to light touch, limp with weightbearing on RLE     Assessment & Plan:   See specific A&P problem list for details.

## 2014-12-02 NOTE — Assessment & Plan Note (Signed)
Recent acute pyelo with pansensitive klebsiella, similar persistent symptoms with Left flank / LBP today of unclear etiology. Consider unresolved UTI? (+urinary freq, nausea) - However completed Cefuroxime x 14 days, other UTI symptoms resolved. Afebrile, vitals stable.  Plan: 1. UA (+trace leuks, neg nitrite, WBC 1-5, occasional RBC) and Urine preg (negative) 2. Ordered Urine Culture 3. No repeat abx at this time. Unlikely UTI/Pyelo, f/u urine culture and notify patient if need to repeat course

## 2014-12-02 NOTE — Assessment & Plan Note (Signed)
Stable R-sided chest pain with +reproducible and +pleuritic component since recent hospitalization, with RUL CAP. - Unlikely PE (recent Chest CTA negative PE 11/01/14, no tachycardia, stable symptoms, previously on anticoagulation (off x 48 hours, inc risk), no evidence of DVT - Not suggestive of ACS, constant reproducible pain, no DOE or SOB  Plan: 1. Resume anticoagulation, switch from Xarelto to Eliquis 2. High dose NSAIDs trial 3. Cautioned on red flags, and advised to go directly to ED if worsening or new symptoms 4. RTC within 1 week for re-evaluation

## 2014-12-02 NOTE — Assessment & Plan Note (Signed)
Chronic iron deficiency anemia, recent heavy bleeding with last menstrual cycle early December. - Recent transfusion x 2 units last hospitalization (Hgb 6.7 to >9.0) - Endorses weakness  Plan: 1. Check CBC 2. Continue iron supplement

## 2014-12-02 NOTE — Assessment & Plan Note (Signed)
Chronic anticoagulation due to hx recurrent PEs, prior work-up negative (anti-thrombin III, prothrombin, anticardiolipin, factor V leiden). Recently resumed therapy with Xarelto after hospitalization. Confirmed Chest CTA negative for PE, no signs/symptoms of DVT. Patient concerned about side-effects on Xarelto, given persistent symptoms with MSK pains, and some increased bleeding (epistaxis and menstrual). - Currently unlikely PE today (no tachycardia, stable chest pain since hospitalization, no clinical evidence DVT) - Out Xarelto x 48 hours  Plan: 1. Start Eliquis 2.5mg  PO BID x 30 days for prophylaxis - discontinue Xarelto. Given 30 day free sample card in office today and printed rx, instructed to go directly to pharmacy to fill 2. Plan to contact MAP to discuss switching to Eliquis if better tolerated by patient 3. RTC 1 week to f/u anticoagulation

## 2014-12-02 NOTE — Assessment & Plan Note (Signed)
Concern with hypercoagulability worse with previous pregnancy, also with hx eclampsia. Interested in future f/u with OB/GYN to discuss BTL for contraception

## 2014-12-02 NOTE — Patient Instructions (Addendum)
Dear Louie Bun, Thank you for coming in to clinic today.  1. Discontinued Xarelto - Start Eliquis 2.5mg  twice daily (every 12 hours) - printed rx for 1 month supply (60 tablets, 0 refills) - with this sample card, this should be covered for a free 30 day trial, then we can work on coverage with MAP program. 2. For your Low Back Pain - it is possible related to repeat urinary infection (we are testing a Urine Culture - this will take a few days to get results) we will call you if we need to start a new antibiotic. 3. For chest and leg pain, it does not seem likely to be related to a blood clot at this time. Mainly reassurance, continue to take anti-inflammatory Ibuprofen 400-600mg  every 6 hours as needed for pain, and also Tylenol.  Ordered blood work (basic labs, blood count, hemoglobin / iron level)  Please schedule a follow-up appointment with me within 1 week to discuss labs and follow-up.  If you have any other questions or concerns, please feel free to call the clinic to contact me. You may also schedule an earlier appointment if necessary.  However, if your symptoms get significantly worse, please go to the Emergency Department to seek immediate medical attention.  Nobie Putnam, DO [facility]   Apixaban oral tablets What is this medicine? APIXABAN (a PIX a ban) is an anticoagulant (blood thinner). It is used to lower the chance of stroke in people with a medical condition called atrial fibrillation. It is also used to treat or prevent blood clots in the lungs or in the veins. This medicine may be used for other purposes; ask your health care provider or pharmacist if you have questions. COMMON BRAND NAME(S): Eliquis What should I tell my health care provider before I take this medicine? They need to know if you have any of these conditions: -bleeding disorders -bleeding in the brain -blood in your stools (black or tarry stools) or if you have blood in  your vomit -history of stomach bleeding -kidney disease -liver disease -mechanical heart valve -an unusual or allergic reaction to apixaban, other medicines, foods, dyes, or preservatives -pregnant or trying to get pregnant -breast-feeding How should I use this medicine? Take this medicine by mouth with a glass of water. Follow the directions on the prescription label. You can take it with or without food. If it upsets your stomach, take it with food. Take your medicine at regular intervals. Do not take it more often than directed. Do not stop taking except on your doctor's advice. Stopping this medicine may increase your risk of a blot clot. Be sure to refill your prescription before you run out of medicine. Talk to your pediatrician regarding the use of this medicine in children. Special care may be needed. Overdosage: If you think you have taken too much of this medicine contact a poison control center or emergency room at once. NOTE: This medicine is only for you. Do not share this medicine with others. What if I miss a dose? If you miss a dose, take it as soon as you can. If it is almost time for your next dose, take only that dose. Do not take double or extra doses. What may interact with this medicine? This medicine may interact with the following: -aspirin and aspirin-like medicines -certain medicines for fungal infections like ketoconazole and itraconazole -certain medicines for seizures like carbamazepine and phenytoin -certain medicines that treat or prevent blood clots like warfarin,  enoxaparin, and dalteparin -clarithromycin -NSAIDs, medicines for pain and inflammation, like ibuprofen or naproxen -rifampin -ritonavir -St. John's wort This list may not describe all possible interactions. Give your health care provider a list of all the medicines, herbs, non-prescription drugs, or dietary supplements you use. Also tell them if you smoke, drink alcohol, or use illegal drugs. Some  items may interact with your medicine. What should I watch for while using this medicine? Notify your doctor or health care professional and seek emergency treatment if you develop breathing problems; changes in vision; chest pain; severe, sudden headache; pain, swelling, warmth in the leg; trouble speaking; sudden numbness or weakness of the face, arm, or leg. These can be signs that your condition has gotten worse. If you are going to have surgery, tell your doctor or health care professional that you are taking this medicine. Tell your health care professional that you use this medicine before you have a spinal or epidural procedure. Sometimes people who take this medicine have bleeding problems around the spine when they have a spinal or epidural procedure. This bleeding is very rare. If you have a spinal or epidural procedure while on this medicine, call your health care professional immediately if you have back pain, numbness or tingling (especially in your legs and feet), muscle weakness, paralysis, or loss of bladder or bowel control. Avoid sports and activities that might cause injury while you are using this medicine. Severe falls or injuries can cause unseen bleeding. Be careful when using sharp tools or knives. Consider using an Copy. Take special care brushing or flossing your teeth. Report any injuries, bruising, or red spots on the skin to your doctor or health care professional. What side effects may I notice from receiving this medicine? Side effects that you should report to your doctor or health care professional as soon as possible: -allergic reactions like skin rash, itching or hives, swelling of the face, lips, or tongue -signs and symptoms of bleeding such as bloody or black, tarry stools; red or dark-brown urine; spitting up blood or brown material that looks like coffee grounds; red spots on the skin; unusual bruising or bleeding from the eye, gums, or nose This list may  not describe all possible side effects. Call your doctor for medical advice about side effects. You may report side effects to FDA at 1-800-FDA-1088. Where should I keep my medicine? Keep out of the reach of children. Store at room temperature between 20 and 25 degrees C (68 and 77 degrees F). Throw away any unused medicine after the expiration date. NOTE: This sheet is a summary. It may not cover all possible information. If you have questions about this medicine, talk to your doctor, pharmacist, or health care provider.  2015, Elsevier/Gold Standard. (2013-07-26 11:59:24)

## 2014-12-02 NOTE — Assessment & Plan Note (Signed)
Recent hypokalemia in hospital, no significant prior hx, unlikely but may be contributing factor to muscle pain and cramping  Plan: 1. Re-check BMET

## 2014-12-03 LAB — CBC
HEMATOCRIT: 36.2 % (ref 36.0–46.0)
HEMOGLOBIN: 10.9 g/dL — AB (ref 12.0–15.0)
MCH: 23 pg — AB (ref 26.0–34.0)
MCHC: 30.1 g/dL (ref 30.0–36.0)
MCV: 76.5 fL — ABNORMAL LOW (ref 78.0–100.0)
MPV: 9.8 fL (ref 8.6–12.4)
Platelets: 348 10*3/uL (ref 150–400)
RBC: 4.73 MIL/uL (ref 3.87–5.11)
RDW: 23.3 % — AB (ref 11.5–15.5)
WBC: 6.2 10*3/uL (ref 4.0–10.5)

## 2014-12-03 LAB — URINE CULTURE
COLONY COUNT: NO GROWTH
ORGANISM ID, BACTERIA: NO GROWTH

## 2014-12-10 ENCOUNTER — Telehealth: Payer: Self-pay | Admitting: Family Medicine

## 2014-12-10 NOTE — Telephone Encounter (Addendum)
Called [facility] for interval follow-up and review recent lab results (BMET unremarkable, CBC improved Hgb 10.9 (from 9.2, chronically low), last OV 12/02/14, at that time decision to discontinue Xarelto and start Eliquis 2.5mg  BID for chronic anticoagulation (hx recurrent PE/DVT), switch made due to multiple side-effects / intolerance to Xarelto (see last OV for details). Patient reported that since switching she has been doing significantly better, stated that all symptoms that she had associated with the Xarelto have resolved. Denies any persistent chest pain, leg pain, abd pain, bloating, weight gain, bleeding. However, she does admit to starting to get a "cold" with some early URI symptoms and her chest discomfort from before has returned. She plans to schedule a follow-up if symptoms get worse or do not improve.  Regarding the Eliquis rx, she was given a coupon / card for a free 30 day trial at last OV. She stated that she had no problem obtaining this rx, previously was going to use the MAP program for Xarelto assistance, but this was not pursued. She states now assistance may not be needed for Eliquis as she has new insurance, just received within past 2 days. I advised her that she should bring all of her insurance information and ins card to [facility] to have it scanned and entered into our system, and recommend RTC or call within 1-2 weeks for [place] to refill Eliquis for continued use.  Nobie Putnam, [place], PGY-2

## 2014-12-18 ENCOUNTER — Encounter (HOSPITAL_COMMUNITY): Payer: Self-pay | Admitting: Obstetrics & Gynecology

## 2015-01-05 ENCOUNTER — Other Ambulatory Visit: Payer: Self-pay | Admitting: Family Medicine

## 2015-01-05 DIAGNOSIS — Z7901 Long term (current) use of anticoagulants: Secondary | ICD-10-CM

## 2015-01-05 MED ORDER — APIXABAN 2.5 MG PO TABS: 2.5000 mg | ORAL_TABLET | Freq: Two times a day (BID) | ORAL | Status: DC

## 2015-01-06 ENCOUNTER — Telehealth: Payer: Self-pay | Admitting: Family Medicine

## 2015-01-06 NOTE — Telephone Encounter (Signed)
Attempted to reach patient by telephone again today, yesterday sent in refill for Eliquis 2.5mg  PO BID (indicated for chronic hx recurrent DVT/PE) with refills to [facility] ([address]), as suspected patient is nearly out of current rx (received 30 day supply coupon at last visit in January), patient has not followed up at [facility] as requested to refill rx and confirm new insurance coverage etc. Yesterday 01/05/15 rx Eliquis electronically sent in, and I LVM for patient to pick-up rx or call [facility] back if concerns. Today, I called [facility] (confirmed pt has no new insurance on file) and has not picked up rx. They will add her to list to call and notify patient of this. Additionally, called her back and LVM for her to again pick-up rx, update ins info, and schedule close f/u at [facility] this week to update ins, continue Eliquis (may need samples or additional coupon if not covered, then would pursue MAP program). In voice mail today, stressed importance of not missing a single day dose of Eliquis, and would put her at increased risk of develop DVT/PE, red flag symptoms given, advised to seek treatment if develops any acute problem.  Nobie Putnam, [place], PGY-2

## 2015-04-03 ENCOUNTER — Other Ambulatory Visit: Payer: Self-pay | Admitting: Family Medicine

## 2015-04-03 DIAGNOSIS — A6000 Herpesviral infection of urogenital system, unspecified: Secondary | ICD-10-CM

## 2015-05-06 ENCOUNTER — Encounter: Payer: Self-pay | Admitting: Family Medicine

## 2015-05-06 ENCOUNTER — Ambulatory Visit (INDEPENDENT_AMBULATORY_CARE_PROVIDER_SITE_OTHER): Payer: 59 | Admitting: Family Medicine

## 2015-05-06 VITALS — BP 108/74 | HR 76 | Temp 98.4°F | Ht 63.0 in | Wt 160.5 lb

## 2015-05-06 DIAGNOSIS — A499 Bacterial infection, unspecified: Secondary | ICD-10-CM

## 2015-05-06 DIAGNOSIS — N760 Acute vaginitis: Secondary | ICD-10-CM | POA: Diagnosis not present

## 2015-05-06 DIAGNOSIS — H109 Unspecified conjunctivitis: Secondary | ICD-10-CM

## 2015-05-06 DIAGNOSIS — B9689 Other specified bacterial agents as the cause of diseases classified elsewhere: Secondary | ICD-10-CM

## 2015-05-06 MED ORDER — METRONIDAZOLE 500 MG PO TABS: 500.0000 mg | ORAL_TABLET | Freq: Two times a day (BID) | ORAL | Status: DC

## 2015-05-06 NOTE — Progress Notes (Signed)
   Subjective:    Patient ID: Paula Benson, female    DOB: 04/12/1986, 29 y.o.   MRN: [id number]  HPI  EYE COMPLAINT  Eye symptoms started 3 days days ago. Eye involved: first her left then right Eye symptom progression: feel irritated like sand in her eyes, and are red.  Minimal clear discharge no change in vision.  No significant pain Other people with same problem: daughter  Medications tried:  Daughters drops Eye Trauma: no Contact Lens: no Recent eye surgeries: no  Symptoms Itching: mild Eye discharge or mattering: mild Vision impairment: none Photophobia: no Nose discharge: no Sneezing: no Vomiting: no Rings around lights:  no    Vaginal discharge - scant very consistent with prior episodes of BV.  No sores or pain or new contacts or fever   ROS see HPI Smoking Status noted  Review of Systems     Objective:   Physical Exam Alert no acute distress Vision acuity normal Eye - Pupils Equal Round Reactive to light, Extraocular movements intact, Fundi without hemorrhage or visible lesions, Conjunctiva with peripheral redness no discharge No preauricular nodes         Assessment & Plan:   Conjunctivitis - very likely viral given bilateral nature, contact and clear discharge.  Treat symptomatically and monitor symptoms   BV - very likely given her symptoms.  Elected to treat and reevaluate if symptoms do not improve

## 2015-05-06 NOTE — Patient Instructions (Signed)
Good to see you today!  Thanks for coming in.  Your conjunctivitis should last about 7 days.  If not better by then or if worsening or if severe pain or if unable to see well you should be seen again.  Take the flagyl for your vaginosis.  If not improved or if new symptoms (pain, bleeding,sores) come back

## 2015-05-08 ENCOUNTER — Telehealth: Payer: Self-pay | Admitting: Family Medicine

## 2015-05-08 MED ORDER — ERYTHROMYCIN 5 MG/GM OP OINT: 1.0000 "application " | TOPICAL_OINTMENT | Freq: Four times a day (QID) | OPHTHALMIC | Status: DC

## 2015-05-08 NOTE — Telephone Encounter (Signed)
Rx sent for erythromicin opthalmic

## 2015-05-08 NOTE — Telephone Encounter (Signed)
Pt need eye drop rx sent [facility] at [place]/[address]

## 2015-05-11 ENCOUNTER — Telehealth: Payer: Self-pay | Admitting: Family Medicine

## 2015-05-11 NOTE — Telephone Encounter (Signed)
Patient dropped FMLA Forms to be completed and signed by PCP.  Please, follow up with Patient.

## 2015-05-11 NOTE — Telephone Encounter (Signed)
Form placed in PCP box for completion. 

## 2015-05-11 NOTE — Telephone Encounter (Signed)
I attempted to call patient at # listed [phone] to discuss FMLA paperwork, as I do not see any prior scanned FMLA paper work in her chart. She did not pick up.  I am on hospital call today, and will attempt to call her again to discuss forms later this week.  If this is more urgent, and cannot wait until Weds 05/13/15, she will need to schedule an appointment to have a provider fill out forms with her in person.  Nobie Putnam, [place], PGY-2

## 2015-05-14 ENCOUNTER — Encounter (HOSPITAL_COMMUNITY): Payer: Self-pay | Admitting: Obstetrics & Gynecology

## 2015-05-15 NOTE — Telephone Encounter (Signed)
FMLA Information Received FMLA paperwork for patient on 05/11/15 for renewal. Diagnosis / Indication: Genital Herpes (HSV2), chronic recurrent outbreaks Symptoms: Severe pain Job limitations: Prolonged sitting, ambulation Most recent office visit: 05/06/15 Hospitalizations: n/a Incapacitated dates: n/a Frequency of requested intermittent medical leave: 16 hours per week Treatment: Acyclovir 200mg  BID, inc to TID with flares Additional appointments necessary for treatment: Yes  Called patient, discussed FMLA paperwork with patient, current condition unchanged since last FMLA paperwork completed 2014. Reviewed the above information.  Completed, signed, and dated FMLA paperwork 05/15/15. Returned to [employer] RN, today 05/15/15 to be faxed to [phone], and to be scanned into chart.

## 2015-05-15 NOTE — Telephone Encounter (Signed)
Unable to leave voice message (voice mailbox is full). FMLA paperwork is complete and ready for pick up.  Forms were faxed to number provided and copied for scanning in patient's record.  Derl Barrow, RN

## 2015-06-03 ENCOUNTER — Ambulatory Visit: Payer: 59 | Admitting: Family Medicine

## 2015-07-10 ENCOUNTER — Other Ambulatory Visit: Payer: Self-pay | Admitting: Family Medicine

## 2015-07-10 DIAGNOSIS — N760 Acute vaginitis: Principal | ICD-10-CM

## 2015-07-10 DIAGNOSIS — B9689 Other specified bacterial agents as the cause of diseases classified elsewhere: Secondary | ICD-10-CM

## 2015-07-10 MED ORDER — METRONIDAZOLE 500 MG PO TABS: 500.0000 mg | ORAL_TABLET | Freq: Two times a day (BID) | ORAL | Status: DC

## 2015-07-10 NOTE — Telephone Encounter (Signed)
Sent in new prescription refill for Flagyl 500mg  BID x 7 days for BV.  Could you please call patient to inform her that this rx was sent in to pharmacy. She may pick it up when ready.  Nobie Putnam, [place], PGY-3

## 2015-07-10 NOTE — Telephone Encounter (Signed)
Patient informed, expressed understanding. 

## 2015-07-10 NOTE — Telephone Encounter (Signed)
Pt has dealt with bacterial vaginosis after each period and needs a refill on metroNIDAZOLE (FLAGYL) 500 MG tablet. She would like this medication before the weekend. (sr)

## 2015-09-13 ENCOUNTER — Other Ambulatory Visit: Payer: Self-pay | Admitting: Family Medicine

## 2015-09-13 DIAGNOSIS — A6000 Herpesviral infection of urogenital system, unspecified: Secondary | ICD-10-CM

## 2015-09-22 ENCOUNTER — Ambulatory Visit: Payer: 59 | Admitting: Family Medicine

## 2015-11-27 IMAGING — CT CT ANGIO CHEST
1 of 2 series · 19 of 32 positions shown · IV contrast (OMNIPAQUE)
Comparison: CT scan of February 16, 2012.

CLINICAL DATA: Chest pain.

EXAM:
CT ANGIOGRAPHY CHEST WITH CONTRAST
TECHNIQUE: Multidetector CT imaging of the chest was performed using the
standard protocol during bolus administration of intravenous
contrast. Multiplanar CT image reconstructions and MIPs were
obtained to evaluate the vascular anatomy.
CONTRAST:  100mL OMNIPAQUE IOHEXOL 350 MG/ML SOLN

[Series 7: thins · axial · 0.66mm/px · z∈[-228,-2]mm · 19 of 253 slices shown]
[im 13/253  lung]
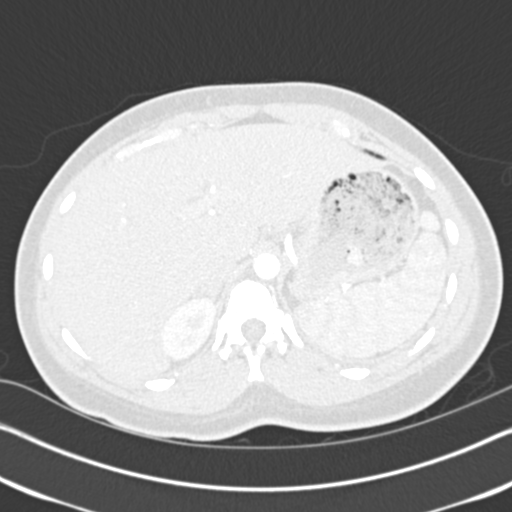
[im 26/253  mediastinal]
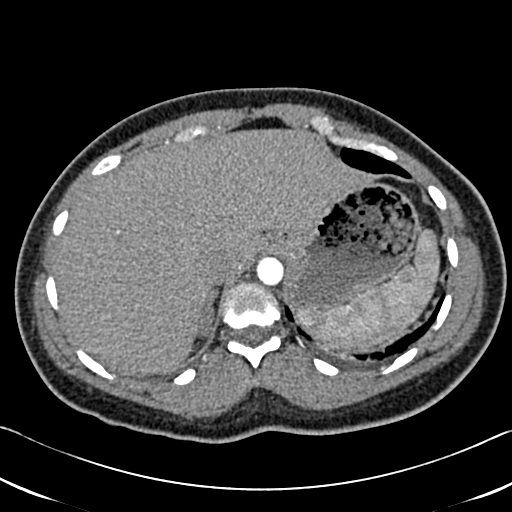
[im 38/253  lung]
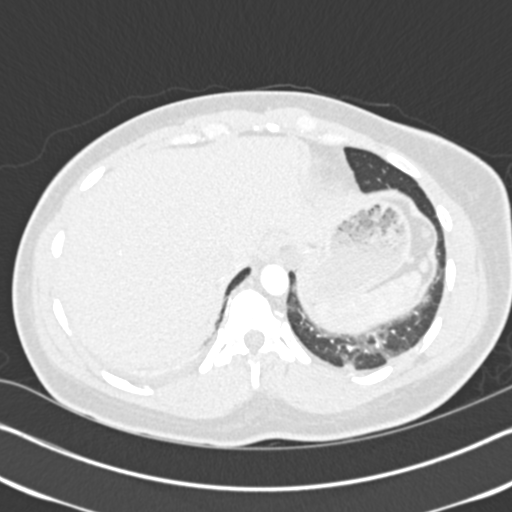
[im 64/253  mediastinal]
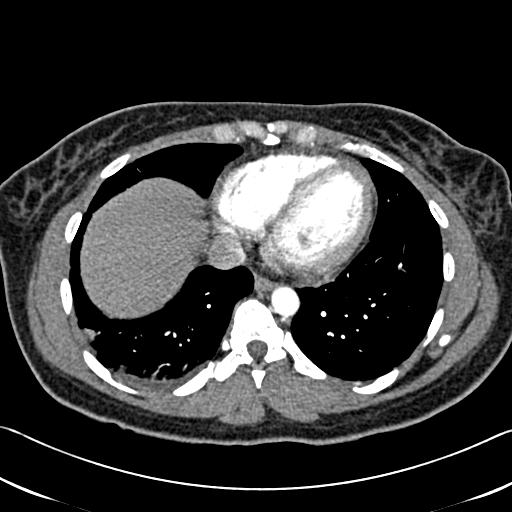
[im 76/253  lung]
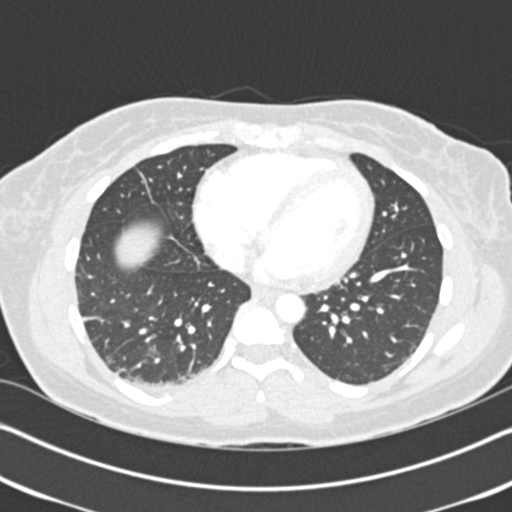
[im 85/253  mediastinal]
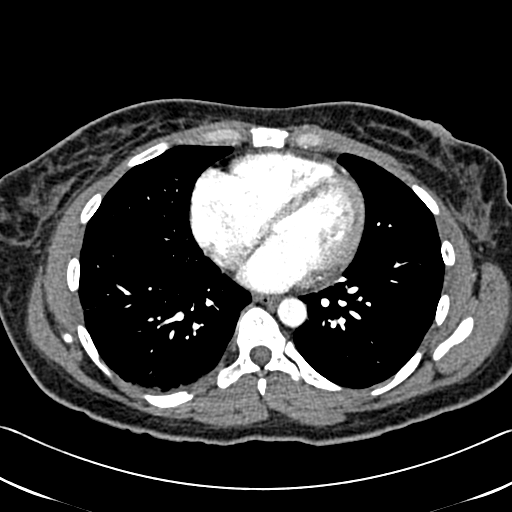
[im 89/253  lung]
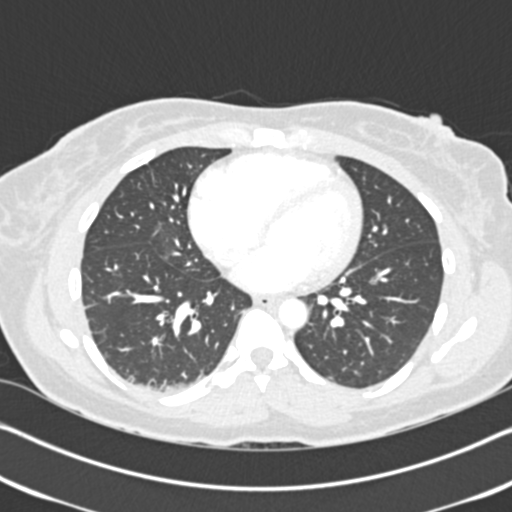
[im 101/253  mediastinal]
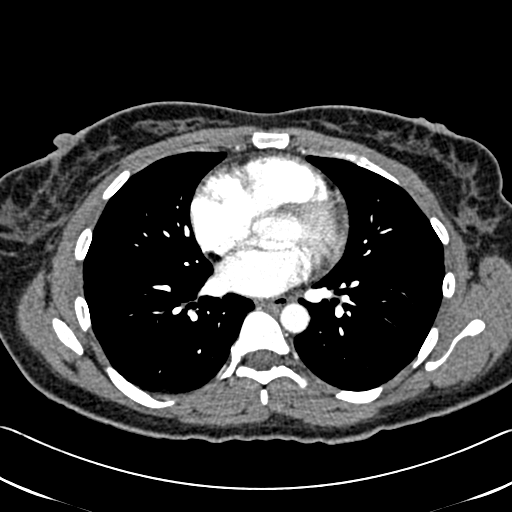
[im 114/253  lung]
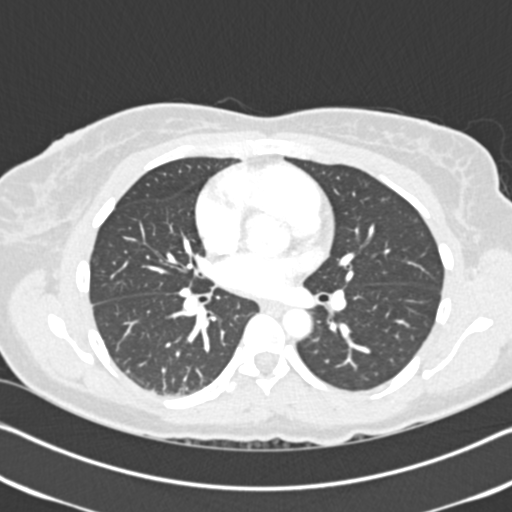
[im 127/253  mediastinal]
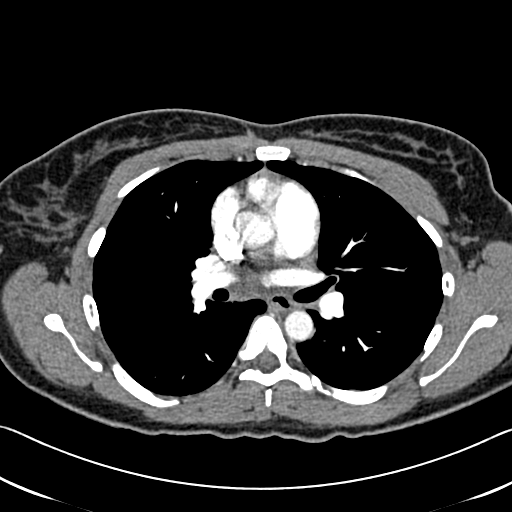
[im 139/253  lung]
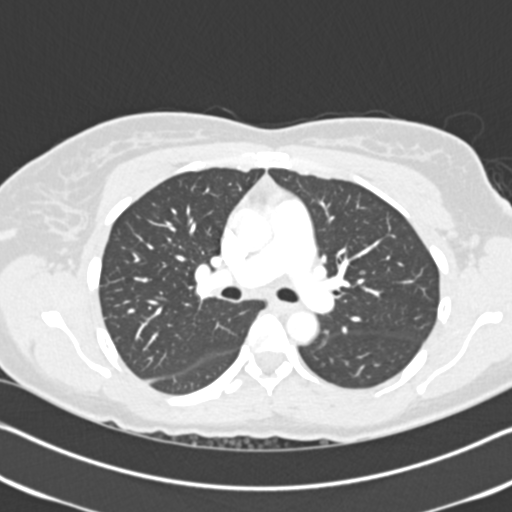
[im 152/253  mediastinal]
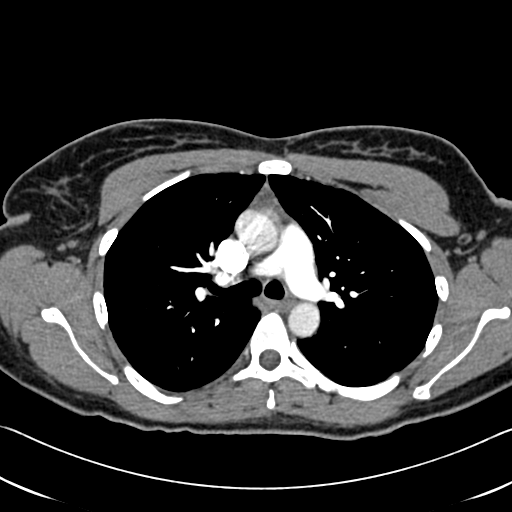
[im 164/253  lung]
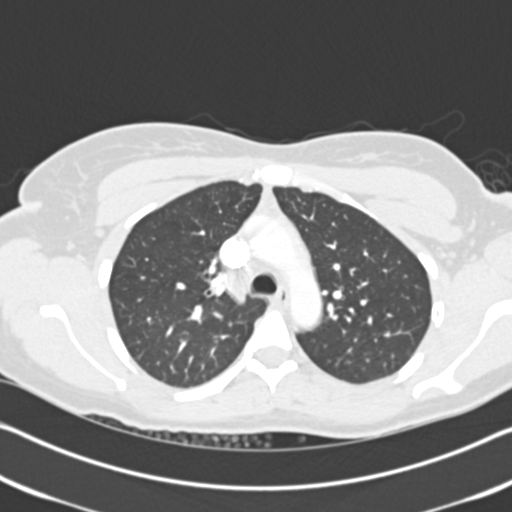
[im 169/253  mediastinal]
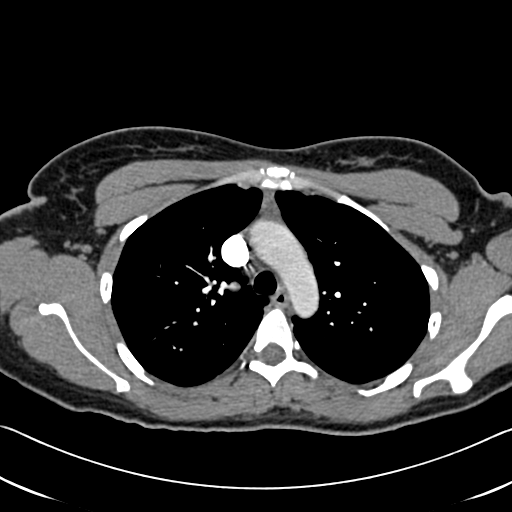
[im 177/253  lung]
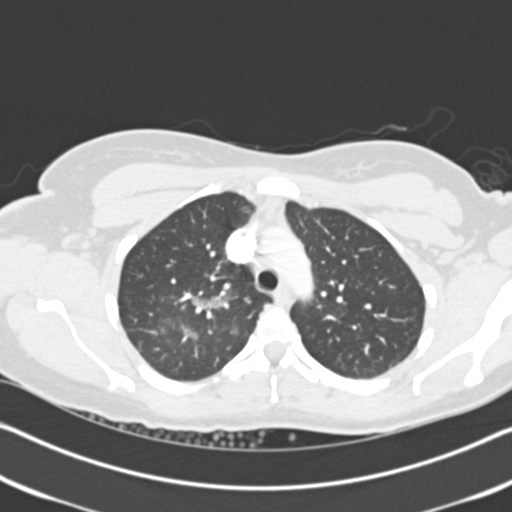
[im 190/253  mediastinal]
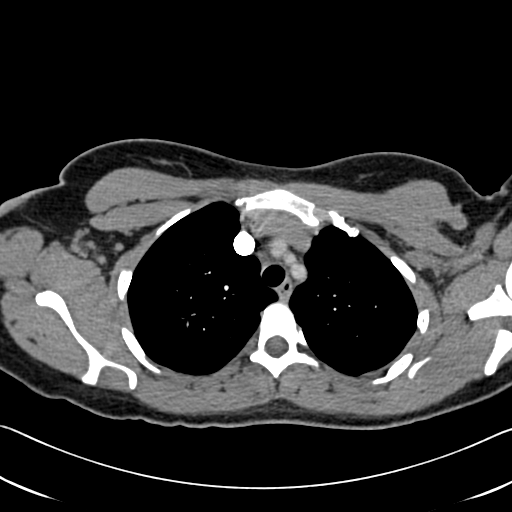
[im 215/253  lung]
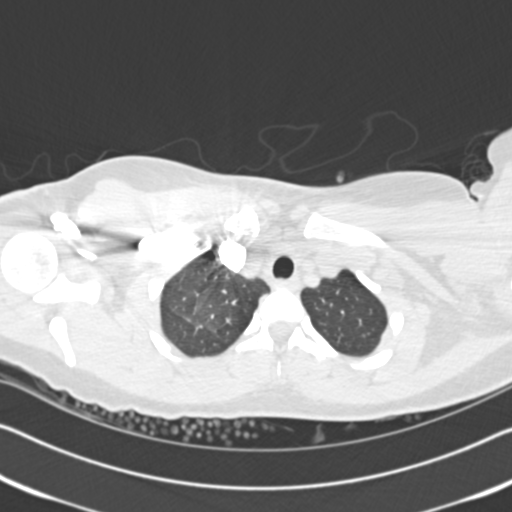
[im 227/253  mediastinal]
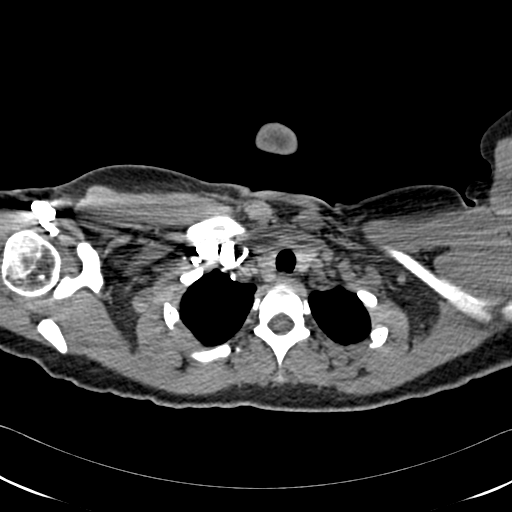
[im 240/253  lung]
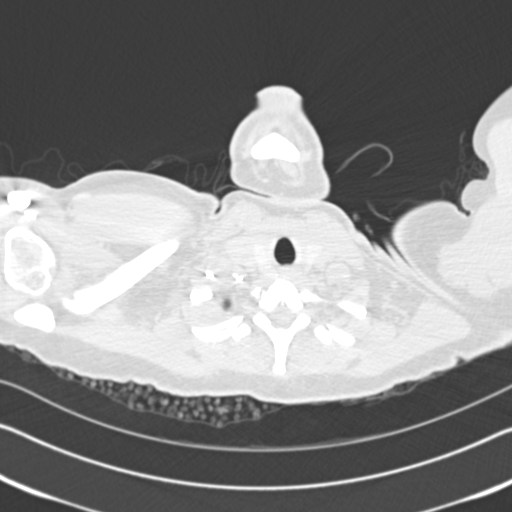

[19 of 32 positions shown; findings below may reference images not displayed]

FINDINGS: No pneumothorax is noted. Minimal right pleural effusion is noted
with adjacent subsegmental atelectasis. Mild ill-defined airspace
opacity is noted in right upper lobe concerning for possible early
pneumonia. There is no evidence of pulmonary embolus. No mediastinal
mass or adenopathy is noted. Visualized portion of upper abdomen
appears normal. No significant osseous abnormality is noted in the
chest. Thoracic aorta appears grossly normal.

Review of the MIP images confirms the above findings.
IMPRESSION: No evidence of pulmonary embolus.

Minimal right pleural effusion is noted with adjacent subsegmental
atelectasis.

Ill-defined airspace opacity is noted in the right upper lobe
concerning for early pneumonia.

## 2015-12-31 ENCOUNTER — Encounter: Payer: Self-pay | Admitting: Family Medicine

## 2015-12-31 ENCOUNTER — Ambulatory Visit (INDEPENDENT_AMBULATORY_CARE_PROVIDER_SITE_OTHER): Payer: 59 | Admitting: Family Medicine

## 2015-12-31 VITALS — BP 137/78 | HR 106 | Temp 98.1°F | Wt 176.0 lb

## 2015-12-31 DIAGNOSIS — R42 Dizziness and giddiness: Secondary | ICD-10-CM

## 2015-12-31 DIAGNOSIS — R071 Chest pain on breathing: Secondary | ICD-10-CM

## 2015-12-31 DIAGNOSIS — Z7901 Long term (current) use of anticoagulants: Secondary | ICD-10-CM | POA: Diagnosis not present

## 2015-12-31 DIAGNOSIS — I2699 Other pulmonary embolism without acute cor pulmonale: Secondary | ICD-10-CM

## 2015-12-31 DIAGNOSIS — D509 Iron deficiency anemia, unspecified: Secondary | ICD-10-CM

## 2015-12-31 LAB — POCT HEMOGLOBIN: Hemoglobin: 11 g/dL — AB (ref 12.2–16.2)

## 2015-12-31 NOTE — Assessment & Plan Note (Addendum)
Concern for new acute/subacute recurrent PE with constellation of symptoms pleuritic CP, SOB, tachycardia off chronic anticoagulation with Eliquis >9 months. History significant with prior recurrent PE x 3 with negative hypercoag testing (anti-thrombin III, prothrombin, anticardiolipin, factor V leiden). Well's score 6 (PE most likely, tachycardia, h/o PE), no hypoxia 99% on RA, cannot rule out and needs further emergent work-up. Differential is broad otherwise for chest pain, but need to rule out PE first.  Plan: 1. Advised that we recommend sending patient directly to ED for further emergent testing and possibly imaging for work-up PE. She understands this risk but declines against medical advice and wishes to take her personal vehicle and go later today. She signed a Chartered loss adjuster in our office for AMA. 2. Continue Eliquis as prescribed, she does not need a refill today, but depending on work-up dose may need to be adjusted to therapeutic dose increase if confirm a new PE 3. Follow-up within days after ED for anticoagulation follow-up - Discussed case with Dr Mingo Amber (attending preceptor) who agrees with this plan

## 2015-12-31 NOTE — Assessment & Plan Note (Signed)
Chronic known IDA, not taking ferrous sulfate regularly, only 1 prenatal vitamin daily. - Check POC Hgb - 11, stable - Advised increase iron to BID then TID if tolerated

## 2015-12-31 NOTE — Assessment & Plan Note (Signed)
Non-adherent to Eliquis >9 months off, now resumed within 1 week of current symptoms concerning for possible recurrent PE. - Send directly to ED for further work-up PE - Continue Eliquis, unless otherwise advised by ED to change treatment if dx active PE - Follow-up for anticoag within 1 week

## 2015-12-31 NOTE — Progress Notes (Signed)
Subjective:    Patient ID: Paula Benson, female    DOB: November 17, 1986, 30 y.o.   MRN: [id number]  Paula Benson is a 30 y.o. female presenting on 12/31/2015 for Dizziness  HPI  DIZZINESS / CHEST PAIN / CHRONIC ANTICOAGULATION / RECURRENT PE's: - Chronic known history of recurrent PEs x 3 in the past (previously some provoked pregnancy and hospitalizations), has had hypercoagulable work-up negative in the past. She was managed on chronic anticoagulation various medications over years, including Coumadin (non-adherence to INR follow-up) and then Xarelto (cost, compliance, and then had side-effects), last hospitalized in 10/2014 and transitioned to Xarelto on discharge, she could not tolerate this and in 11/2014 she was switched to Eliquis at 2.5mg  BID. She did not follow-up in 2016 for anticoagulation. - Today she reports a constellation of symptoms, and she is concerned for possible PE, as she admits has not taken Eliquis in past >9 months but did start taking it again last week once symptoms started, describes some of her symptoms similar to prior PE. She has a pleuritic chest and back pain on Right side with deep breathing, started 1 week ago but intermittently occurring, seems to be worsening, also with some chest pains bilateral not pleuritic, but denies chest pressure or tightness. Some shortness of breath due to pleuritic pain. No significant productive cough or hemoptysis. Some sick contact exposure with father having URI but she did not have cold symptoms. - Additionally she admits more recent symptoms with sudden onset dizziness, vertigo, and generally feeling "tired or weak" for past 24-48 hours without relief. Denies any fevers, chills, nausea, vomiting, focal weakness, numbness, tingling, HA, loss of vision.  IRON DEFICIENCY ANEMIA: - See above HPI, also requested Hgb check given the weakness, and her prior history. Last checked 10-11 in 2015. Denies any bleeding.  Social History    Social History  . Marital Status: Single    Spouse Name: N/A  . Number of Children: N/A  . Years of Education: N/A   Occupational History  . Not on file.   Social History Main Topics  . Smoking status: Never Smoker   . Smokeless tobacco: Never Used  . Alcohol Use: No  . Drug Use: No  . Sexual Activity: Yes    Birth Control/ Protection: Condom   Other Topics Concern  . Not on file   Social History Narrative   Student. Plans on staying here in [place] (originally living in [place]). 2 daughters.    Review of Systems Per HPI unless specifically indicated above     Objective:    BP 137/78 mmHg  Pulse 106  Temp(Src) 98.1 F (36.7 C) (Oral)  Wt 176 lb (79.833 kg)  LMP 12/11/2015 (Exact Date)  Wt Readings from Last 3 Encounters:  12/31/15 176 lb (79.833 kg)  05/06/15 160 lb 8 oz (72.802 kg)  12/02/14 162 lb (73.483 kg)    Physical Exam  Constitutional: She is oriented to person, place, and time. She appears well-developed and well-nourished. No distress.  Comfortable, cooperative  HENT:  Head: Normocephalic and atraumatic.  Mouth/Throat: Oropharynx is clear and moist.  Eyes: Conjunctivae are normal. Pupils are equal, round, and reactive to light.  Neck: Neck supple.  Cardiovascular: Regular rhythm, normal heart sounds and intact distal pulses.   No murmur heard. Tachycardic  Pulmonary/Chest: Effort normal and breath sounds normal. No respiratory distress. She has no wheezes. She has no rales.  Some reduced air movement with effort due to not taking full deep  breaths. Speaks full sentences  Musculoskeletal: She exhibits no edema.  Calves non-tender, no erythema, symmetrical  Lymphadenopathy:    She has no cervical adenopathy.  Neurological: She is alert and oriented to person, place, and time.  Skin: Skin is warm and dry. No rash noted. She is not diaphoretic.  Nursing note and vitals reviewed.  Results for orders placed or performed in visit on 12/31/15   Hemoglobin  Result Value Ref Range   Hemoglobin 11 (A) 12.2 - 16.2 g/dL      Assessment & Plan:   Problem List Items Addressed This Visit    Chronic anticoagulation    Non-adherent to Eliquis >9 months off, now resumed within 1 week of current symptoms concerning for possible recurrent PE. - Send directly to Paula for further work-up PE - Continue Eliquis, unless otherwise advised by Paula to change treatment if dx active PE - Follow-up for anticoag within 1 week      Iron deficiency anemia    Chronic known IDA, not taking ferrous sulfate regularly, only 1 prenatal vitamin daily. - Check POC Hgb - 11, stable - Advised increase iron to BID then TID if tolerated      Relevant Orders   Hemoglobin (Completed)   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[place]) - Primary    Concern for new acute/subacute recurrent PE with constellation of symptoms pleuritic CP, SOB, tachycardia off chronic anticoagulation with Eliquis >9 months. History significant with prior recurrent PE x 3 with negative hypercoag testing (anti-thrombin III, prothrombin, anticardiolipin, factor V leiden). Well's score 6 (PE most likely, tachycardia, h/o PE), no hypoxia 99% on RA, cannot rule out and needs further emergent work-up. Differential is broad otherwise for chest pain, but need to rule out PE first.  Plan: 1. Advised that we recommend sending patient directly to Paula for further emergent testing and possibly imaging for work-up PE. She understands this risk but declines against medical advice and wishes to take her personal vehicle and go later today. She signed a Chartered loss adjuster in our office for AMA. 2. Continue Eliquis as prescribed, she does not need a refill today, but depending on work-up dose may need to be adjusted to therapeutic dose increase if confirm a new PE 3. Follow-up within days after Paula for anticoagulation follow-up - Discussed case with Dr Mingo Amber (attending preceptor) who agrees with this plan       Other Visit Diagnoses     Chest pain on breathing        Dizziness        Unclear etiology, not secondary to anemia, hemodynamically stable except tachycardia, to Paula for rule out PE and other work-up as indicated       No orders of the defined types were placed in this encounter.    Follow up plan: Return in about 4 weeks (around 01/28/2016) for discuss chronic anticoagulation.  Nobie Putnam, [place], PGY-3

## 2015-12-31 NOTE — Patient Instructions (Signed)
We are concerned that you may have a Pulmonary Embolism given your current symptoms. Given non-adherence to Eliquis with your prior history. Your oxygen number is 99% on room air, this is all reassuring. Hemoglobin is 11  Please go directly to the Emergency Department for further testing of your chest pain and work-up for possible pulmonary embolism.  Follow their instructions on medications on discharge, but I recommend resuming the Eliquis.

## 2016-01-02 ENCOUNTER — Emergency Department (HOSPITAL_COMMUNITY): Payer: 59

## 2016-01-02 ENCOUNTER — Emergency Department (HOSPITAL_COMMUNITY)
Admission: EM | Admit: 2016-01-02 | Discharge: 2016-01-02 | Disposition: A | Discharge status: Home / Self Care | Payer: 59 | Attending: Emergency Medicine | Admitting: Emergency Medicine

## 2016-01-02 ENCOUNTER — Other Ambulatory Visit: Payer: Self-pay

## 2016-01-02 ENCOUNTER — Encounter (HOSPITAL_COMMUNITY): Payer: Self-pay | Admitting: Emergency Medicine

## 2016-01-02 DIAGNOSIS — Z87440 Personal history of urinary (tract) infections: Secondary | ICD-10-CM | POA: Diagnosis not present

## 2016-01-02 DIAGNOSIS — Z7901 Long term (current) use of anticoagulants: Secondary | ICD-10-CM | POA: Insufficient documentation

## 2016-01-02 DIAGNOSIS — Z87448 Personal history of other diseases of urinary system: Secondary | ICD-10-CM | POA: Diagnosis not present

## 2016-01-02 DIAGNOSIS — Z8619 Personal history of other infectious and parasitic diseases: Secondary | ICD-10-CM | POA: Diagnosis not present

## 2016-01-02 DIAGNOSIS — Z79899 Other long term (current) drug therapy: Secondary | ICD-10-CM | POA: Insufficient documentation

## 2016-01-02 DIAGNOSIS — J45909 Unspecified asthma, uncomplicated: Secondary | ICD-10-CM | POA: Diagnosis not present

## 2016-01-02 DIAGNOSIS — Z86711 Personal history of pulmonary embolism: Secondary | ICD-10-CM | POA: Insufficient documentation

## 2016-01-02 DIAGNOSIS — Z86718 Personal history of other venous thrombosis and embolism: Secondary | ICD-10-CM | POA: Diagnosis not present

## 2016-01-02 DIAGNOSIS — R5383 Other fatigue: Secondary | ICD-10-CM | POA: Insufficient documentation

## 2016-01-02 DIAGNOSIS — G43909 Migraine, unspecified, not intractable, without status migrainosus: Secondary | ICD-10-CM | POA: Diagnosis not present

## 2016-01-02 DIAGNOSIS — R079 Chest pain, unspecified: Secondary | ICD-10-CM | POA: Diagnosis present

## 2016-01-02 DIAGNOSIS — Z86018 Personal history of other benign neoplasm: Secondary | ICD-10-CM | POA: Diagnosis not present

## 2016-01-02 DIAGNOSIS — D649 Anemia, unspecified: Secondary | ICD-10-CM | POA: Diagnosis not present

## 2016-01-02 DIAGNOSIS — Z8659 Personal history of other mental and behavioral disorders: Secondary | ICD-10-CM | POA: Insufficient documentation

## 2016-01-02 DIAGNOSIS — R42 Dizziness and giddiness: Secondary | ICD-10-CM | POA: Insufficient documentation

## 2016-01-02 DIAGNOSIS — Z3202 Encounter for pregnancy test, result negative: Secondary | ICD-10-CM | POA: Diagnosis not present

## 2016-01-02 LAB — I-STAT TROPONIN, ED: Troponin i, poc: 0 ng/mL (ref 0.00–0.08)

## 2016-01-02 LAB — CBC
HCT: 34.4 % — ABNORMAL LOW (ref 36.0–46.0)
HEMOGLOBIN: 10.6 g/dL — AB (ref 12.0–15.0)
MCH: 23.1 pg — AB (ref 26.0–34.0)
MCHC: 30.8 g/dL (ref 30.0–36.0)
MCV: 74.9 fL — AB (ref 78.0–100.0)
Platelets: 452 10*3/uL — ABNORMAL HIGH (ref 150–400)
RBC: 4.59 MIL/uL (ref 3.87–5.11)
RDW: 18.7 % — ABNORMAL HIGH (ref 11.5–15.5)
WBC: 8.6 10*3/uL (ref 4.0–10.5)

## 2016-01-02 LAB — BASIC METABOLIC PANEL
ANION GAP: 11 (ref 5–15)
BUN: 15 mg/dL (ref 6–20)
CHLORIDE: 107 mmol/L (ref 101–111)
CO2: 21 mmol/L — AB (ref 22–32)
Calcium: 9.1 mg/dL (ref 8.9–10.3)
Creatinine, Ser: 0.94 mg/dL (ref 0.44–1.00)
GFR calc non Af Amer: >60 mL/min (ref >60)
GLUCOSE: 102 mg/dL — AB (ref 65–99)
Potassium: 4.3 mmol/L (ref 3.5–5.1)
Sodium: 139 mmol/L (ref 135–145)

## 2016-01-02 LAB — POC URINE PREG, ED: PREG TEST UR: NEGATIVE

## 2016-01-02 LAB — D-DIMER, QUANTITATIVE: D-Dimer, Quant: 0.37 ug/mL-FEU (ref 0.00–0.50)

## 2016-01-02 MED ORDER — IOHEXOL 350 MG/ML SOLN
100.0000 mL | Freq: Once | INTRAVENOUS | Status: AC | PRN
  Administered 2016-01-02: 100 mL via INTRAVENOUS

## 2016-01-02 MED ORDER — HYDROCODONE-ACETAMINOPHEN 5-325 MG PO TABS: 2.0000 | ORAL_TABLET | ORAL | Status: DC | PRN

## 2016-01-02 NOTE — ED Provider Notes (Signed)
CSN: [id number]     Arrival date & time 01/02/16  0206 History   First MD Initiated Contact with Patient 01/02/16 0252     Chief Complaint  Patient presents with  . Chest Pain     (Consider location/radiation/quality/duration/timing/severity/associated sxs/prior Treatment) HPI Comments: PAteint with 1 week Hx intermittent CP that radiates to back under R scapula   Had pain for 3 days that resolved but was associated with fatigue than 4 days of feeling near normal now with 3 days of CP that radiates to back under scapula with fatigue and SOB   Has Hx PE but is on Eliquis which she states she has been compliant She called her PCP who instructed her to come to the ED for evaluation yesterday morning but she felt better and waited until now because she woke up and became frightened.   Patient is a 30 y.o. female presenting with chest pain. The history is provided by the patient.  Chest Pain Pain location:  R chest Pain quality: aching   Pain radiates to the back: yes   Pain severity:  Moderate Onset quality:  Gradual Duration:  1 week Timing:  Intermittent Progression:  Worsening Chronicity:  Recurrent Context: breathing and at rest   Context: not lifting, no movement, no stress and no trauma   Relieved by:  Nothing Worsened by:  Nothing tried Ineffective treatments:  None tried Associated symptoms: dizziness   Associated symptoms: no fever, no orthopnea, no shortness of breath and not vomiting   Risk factors: prior DVT/PE     Past Medical History  Diagnosis Date  . Asthma   . 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 Bipolar II disorder ([place])   . Suicidal ideations   . History of blood clots     x3  . Anemia   . Urinary tract infection   . Herpes   . Anxiety   . Eclamptic seizure 2003  . Fibroid   . Genital herpes   . Preeclampsia 2003    with first pregnancy  . Pregnancy induced hypertension  2003    seized 2wks after  . Pyelonephritis 2012  . Seizures ([place]) 2003    after first delivery  . Headache(784.0)   . Migraines   . History of blood transfusion MARCH 2013  . Depression     doing ok  . Complication of anesthesia 2003    ASPIRATED DURING EMERGENCY CS   Past Surgical History  Procedure Laterality Date  . Cesarean section  12/2001 and 01/2008  . Induced abortion  01/2010  . Cesarean section  06/26/2012    Procedure: CESAREAN SECTION;  Surgeon: Lavonia Drafts, MD;  Location: [facility];  Service: Obstetrics;  Laterality: N/A;   Family History  Problem Relation Age of Onset  . Diabetes Mother     Type 2  . Anemia Sister   . Hypertension Sister   . Anesthesia problems Neg Hx    Social History  Substance Use Topics  . Smoking status: Never Smoker   . Smokeless tobacco: Never Used  . Alcohol Use: No   OB History    Gravida Para Term Preterm AB TAB SAB Ectopic Multiple Living   4 3 3  0 1 1 0 0 0 3     Review of Systems  Constitutional: Negative for fever and chills.  Respiratory: Negative for chest tightness and shortness of breath.   Cardiovascular:  Positive for chest pain. Negative for orthopnea and leg swelling.  Gastrointestinal: Negative for vomiting.  Neurological: Positive for dizziness.  All other systems reviewed and are negative.     Allergies  Review of patient's allergies indicates no known allergies.  Home Medications   Prior to Admission medications   Medication Sig Start Date End Date Taking? Authorizing Provider  acyclovir (ZOVIRAX) 200 MG capsule TAKE TWO CAPSULES BY MOUTH TWICE DAILY 09/14/15  Yes Olin Hauser, DO  albuterol (VENTOLIN HFA) 108 (90 BASE) MCG/ACT inhaler Inhale 2 puffs into the lungs every 4 (four) hours as needed for wheezing or shortness of breath. For dyspnea. 06/29/12  Yes Tanna Savoy Stinson, DO  apixaban (ELIQUIS) 2.5 MG TABS tablet Take 1 tablet (2.5 mg total) by mouth 2 (two) times daily. 01/05/15  Yes  Olin Hauser, DO  Caffeine-Magnesium Salicylate (DIUREX PO) Take 1 tablet by mouth every 6 (six) hours as needed (for fluid).   Yes Historical Provider, MD  ferrous sulfate 325 (65 FE) MG tablet Take 1 tablet (325 mg total) by mouth 2 (two) times daily with a meal. 11/06/14  Yes Debbe Odea, MD  SUMAtriptan (IMITREX) 25 MG tablet Take 1 tablet at start of migraine headache. If it is not resolved, you may take a 2nd tablet 2 hours later. Patient taking differently: Take by mouth every 2 (two) hours as needed. Take 1 tablet at start of migraine headache. If it is not resolved, you may take a 2nd tablet 2 hours later. 08/14/13  Yes Olin Hauser, DO  erythromycin ophthalmic ointment Place 1 application into both eyes every 6 (six) hours. Patient not taking: Reported on 01/02/2016 05/08/15   Lind Covert, MD  HYDROcodone-acetaminophen (NORCO/VICODIN) 5-325 MG tablet Take 2 tablets by mouth every 4 (four) hours as needed. 01/02/16   Junius Creamer, NP  metroNIDAZOLE (FLAGYL) 500 MG tablet Take 1 tablet (500 mg total) by mouth 2 (two) times daily. Patient not taking: Reported on 01/02/2016 07/10/15   Devonne Doughty Karamalegos, DO   BP 126/75 mmHg  Pulse 87  Temp(Src) 98.3 F (36.8 C) (Oral)  Resp 12  SpO2 99%  LMP 12/11/2015 (Exact Date) Physical Exam  Constitutional: She appears well-developed and well-nourished.  HENT:  Head: Normocephalic.  Eyes: Pupils are equal, round, and reactive to light.  Neck: Normal range of motion.  Cardiovascular: Normal rate and regular rhythm.   Pulmonary/Chest: Effort normal and breath sounds normal.    Abdominal: Soft.  Musculoskeletal: Normal range of motion.  Neurological: She is alert.  Skin: Skin is warm and dry. No rash noted. No pallor.  Nursing note and vitals reviewed.   ED Course  Procedures (including critical care time) Labs Review Labs Reviewed  BASIC METABOLIC PANEL - Abnormal; Notable for the following:    CO2 21 (*)      Glucose, Bld 102 (*)    All other components within normal limits  CBC - Abnormal; Notable for the following:    Hemoglobin 10.6 (*)    HCT 34.4 (*)    MCV 74.9 (*)    MCH 23.1 (*)    RDW 18.7 (*)    Platelets 452 (*)    All other components within normal limits  D-DIMER, QUANTITATIVE (NOT AT [facility])  Randolm Idol, ED  POC URINE PREG, ED    Imaging Review Dg Chest 2 View  01/02/2016  CLINICAL DATA:  Intermittent right-sided chest pain for 1 week. Worsening a couple of hours ago. Shortness of  breath and nausea. Burning pain in the right chest, under the right arm, and the right scapular area. Nonsmoker. EXAM: CHEST  2 VIEW COMPARISON:  10/29/2014 FINDINGS: The heart size and mediastinal contours are within normal limits. Both lungs are clear. The visualized skeletal structures are unremarkable. IMPRESSION: No active cardiopulmonary disease. Electronically Signed   By: Lucienne Capers M.D.   On: 01/02/2016 02:53   Ct Angio Chest Pe W/cm &/or Wo Cm  01/02/2016  CLINICAL DATA:  Acute onset of right-sided chest pain and generalized weakness. Severe dizziness. Initial encounter. EXAM: CT ANGIOGRAPHY CHEST WITH CONTRAST TECHNIQUE: Multidetector CT imaging of the chest was performed using the standard protocol during bolus administration of intravenous contrast. Multiplanar CT image reconstructions and MIPs were obtained to evaluate the vascular anatomy. CONTRAST:  143mL OMNIPAQUE IOHEXOL 350 MG/ML SOLN COMPARISON:  CTA of the chest performed 11/01/2014 FINDINGS: There is no evidence of pulmonary embolus. Minimal bibasilar scarring is noted. The lungs are otherwise clear. There is no evidence of significant focal consolidation, pleural effusion or pneumothorax. No masses are identified; no abnormal focal contrast enhancement is seen. The mediastinum is unremarkable appearance. No mediastinal lymphadenopathy is seen. No pericardial effusion is identified. The great vessels are grossly unremarkable.  No axillary lymphadenopathy is seen. The thyroid gland is unremarkable in appearance. The visualized portions of the liver and spleen are unremarkable. The visualized portions of the pancreas, gallbladder, stomach, adrenal glands and kidneys are within normal limits. No acute osseous abnormalities are seen. Review of the MIP images confirms the above findings. IMPRESSION: 1. No evidence of pulmonary embolus. 2. Minimal bibasilar scarring noted.  Lungs otherwise clear. Electronically Signed   By: Garald Balding M.D.   On: 01/02/2016 05:32   I have personally reviewed and evaluated these images and lab results as part of my medical decision-making.   EKG Interpretation None     patient CT scan to scan is normal given a prescription for Vicodin that she can use for pain she is to follow-up with her primary care physician as needed  MDM   Final diagnoses:  Chest pain, unspecified chest pain type         Junius Creamer, NP 01/02/16 [id number]  Merryl Hacker, MD 01/02/16 [id number]

## 2016-01-02 NOTE — ED Notes (Signed)
Pt states for about a week she has had generalized weakness that resolved itself a couple days ago  Pt states a couple days ago she was very dizzy  Pt states she went to her primary care doctor on Thursday and he told her to come here to be evaluated for a PE as she has a history of that  Pt states her chest pain became worse a couple hours ago and she has had some shortness of breath and some nausea  Pt states the pain is a burning pain and it goes in her right chest, under her right arm into her right scapular area  Pt states it hurts to inhale

## 2016-01-02 NOTE — Discharge Instructions (Signed)
Chest Wall Pain Chest wall pain is pain in or around the bones and muscles of your chest. Sometimes, an injury causes this pain. Sometimes, the cause may not be known. This pain may take several weeks or longer to get better. HOME CARE Pay attention to any changes in your symptoms. Take these actions to help with your pain:  Rest as told by your doctor.  Avoid activities that cause pain. Try not to use your chest, belly (abdominal), or side muscles to lift heavy things.  If directed, apply ice to the painful area:  Put ice in a plastic bag.  Place a towel between your skin and the bag.  Leave the ice on for 20 minutes, 2-3 times per day.  Take over-the-counter and prescription medicines only as told by your doctor.  Do not use tobacco products, including cigarettes, chewing tobacco, and e-cigarettes. If you need help quitting, ask your doctor.  Keep all follow-up visits as told by your doctor. This is important. GET HELP IF:  You have a fever.  Your chest pain gets worse.  You have new symptoms. GET HELP RIGHT AWAY IF:  You feel sick to your stomach (nauseous) or you throw up (vomit).  You feel sweaty or light-headed.  You have a cough with phlegm (sputum) or you cough up blood.  You are short of breath.   This information is not intended to replace advice given to you by your health care provider. Make sure you discuss any questions you have with your health care provider.   Document Released: 05/09/2008 Document Revised: 08/12/2015 Document Reviewed: 02/16/2015 Elsevier Interactive Patient Education 2016 [employer]. Your chest Ct Scan is negative for pulmonary embolism

## 2016-03-10 ENCOUNTER — Other Ambulatory Visit: Payer: Self-pay | Admitting: *Deleted

## 2016-03-10 DIAGNOSIS — A6000 Herpesviral infection of urogenital system, unspecified: Secondary | ICD-10-CM

## 2016-03-10 MED ORDER — ACYCLOVIR 200 MG PO CAPS: 400.0000 mg | ORAL_CAPSULE | Freq: Two times a day (BID) | ORAL | Status: DC

## 2016-03-25 ENCOUNTER — Telehealth: Payer: Self-pay | Admitting: Family Medicine

## 2016-03-25 NOTE — Telephone Encounter (Signed)
Pt would like to speak to the doctor about her weight gain. She has made an appointment to discuss this but had some additional questions. jw

## 2016-03-31 NOTE — Telephone Encounter (Signed)
Called patient back, 03/31/16 to answer her questions. She is scheduled to see me on 04/05/16 for same concern about increased weight gain and would like to discuss medical options for weight loss.  She is currently working on lifestyle modifications without significant improvement, states gained about +30 lbs within past year, she is working on calorie counting, eating smaller portions and healthier choices, started walking for exercise most days at work (around a track), and drinking mostly water only (denies drinking beverages with calories - juice, soda, tea).  She is interested in medication for weight loss, such as phentermine. I advised her that I typically do not prescribe this type of medication, and we would discuss her lifestyle changes in more detail, and consider options with Nutritionist referral, with regards to wt loss medications, I would offer alternative agents that reduce her dietary absorption such as Orlistat first, and may consider Contrave, alternatively if she would like to pursue other wt loss medications or does not respond well to these meds, then we could refer her to Dr Einar Gip at [facility] to discuss their Weight Loss Clinic.  Nobie Putnam, [place], PGY-3

## 2016-04-05 ENCOUNTER — Ambulatory Visit: Payer: 59 | Admitting: Family Medicine

## 2016-07-01 ENCOUNTER — Telehealth: Payer: Self-pay | Admitting: Behavioral Health

## 2016-07-01 NOTE — Telephone Encounter (Signed)
Unable to reach patient at time of Pre-Visit Call.  Left message for patient to return call when available.    

## 2016-07-04 ENCOUNTER — Encounter: Payer: Self-pay | Admitting: Family Medicine

## 2016-07-04 ENCOUNTER — Ambulatory Visit (INDEPENDENT_AMBULATORY_CARE_PROVIDER_SITE_OTHER): Payer: 59 | Admitting: Family Medicine

## 2016-07-04 VITALS — BP 114/72 | HR 87 | Temp 98.8°F | Ht 63.0 in | Wt 168.8 lb

## 2016-07-04 DIAGNOSIS — D508 Other iron deficiency anemias: Secondary | ICD-10-CM

## 2016-07-04 DIAGNOSIS — Z3169 Encounter for other general counseling and advice on procreation: Secondary | ICD-10-CM | POA: Diagnosis not present

## 2016-07-04 DIAGNOSIS — G43009 Migraine without aura, not intractable, without status migrainosus: Secondary | ICD-10-CM | POA: Diagnosis not present

## 2016-07-04 DIAGNOSIS — I2699 Other pulmonary embolism without acute cor pulmonale: Secondary | ICD-10-CM | POA: Diagnosis not present

## 2016-07-04 DIAGNOSIS — F39 Unspecified mood [affective] disorder: Secondary | ICD-10-CM

## 2016-07-04 LAB — COMPREHENSIVE METABOLIC PANEL
ALBUMIN: 3.8 g/dL (ref 3.5–5.2)
ALT: 10 U/L (ref 0–35)
AST: 15 U/L (ref 0–37)
Alkaline Phosphatase: 43 U/L (ref 39–117)
BUN: 15 mg/dL (ref 6–23)
CHLORIDE: 107 meq/L (ref 96–112)
CO2: 25 meq/L (ref 19–32)
CREATININE: 0.92 mg/dL (ref 0.40–1.20)
Calcium: 8.9 mg/dL (ref 8.4–10.5)
GFR: 90.67 mL/min (ref >60.00)
Glucose, Bld: 91 mg/dL (ref 70–99)
Potassium: 4 mEq/L (ref 3.5–5.1)
SODIUM: 139 meq/L (ref 135–145)
Total Bilirubin: 0.4 mg/dL (ref 0.2–1.2)
Total Protein: 7.3 g/dL (ref 6.0–8.3)

## 2016-07-04 LAB — CBC
HCT: 32.8 % — ABNORMAL LOW (ref 36.0–46.0)
Hemoglobin: 10.4 g/dL — ABNORMAL LOW (ref 12.0–15.0)
MCHC: 31.7 g/dL (ref 30.0–36.0)
MCV: 74.7 fl — AB (ref 78.0–100.0)
Platelets: 342 10*3/uL (ref 150.0–400.0)
RBC: 4.39 Mil/uL (ref 3.87–5.11)
RDW: 19.5 % — ABNORMAL HIGH (ref 11.5–15.5)
WBC: 5.7 10*3/uL (ref 4.0–10.5)

## 2016-07-04 LAB — FERRITIN: Ferritin: 4.9 ng/mL — ABNORMAL LOW (ref 10.0–291.0)

## 2016-07-04 MED ORDER — SUMATRIPTAN SUCCINATE 25 MG PO TABS: ORAL_TABLET | ORAL | 1 refills | Status: DC

## 2016-07-04 NOTE — Progress Notes (Signed)
Pre visit review using our clinic review tool, if applicable. No additional management support is needed unless otherwise documented below in the visit note. 

## 2016-07-04 NOTE — Patient Instructions (Signed)
Please use condoms for contraception until we have a chance to sort out your lung blood clots with hematology and to discuss OBG prior to getting pregnant again!  I will be in touch with your labs and also will arrange these appointments for you.

## 2016-07-04 NOTE — Progress Notes (Signed)
[place] [facility] [address] [place], [place] [address] [phone]  Date:  07/04/2016   Name:  Paula Benson   DOB:  [birth year]   MRN:  [id number]  PCP:  Adin Hector, MD    Chief Complaint: Establish Care (Pt here to est care. )   History of Present Illness:  Paula Benson is a 30 y.o. very pleasant female patient who presents with the following:  Here today as a new patient to establish care.  She has a history of recurrent PE; the first occurred after she delivered her 30yo and she had a c/s with complications and a prolonged hospital stay.  Per her report this PE was thought due to immobility.  She also reports that she had post- delivery eclampsia with her first pregnancy.   She had another PE couple of months after the first, and then the last about 7 years ago.     She is supposed to be taking eliquis but is not currently.  She had tried another med but it did not agree with her.   She did ok with eliquis but stopped taking it due to expense.  She stopped taking it some time in 2016.   She was never found to have a hematologic abnl to cause her PE per her knowledge No one in her family has history of PE.   LMP 06/11/16  She has had severe anemia in the past and had a blood transfusion.   She has not seen hematology as an outp however.  She is not sure what caused her past anemia.   She does have a history of migraine HA; she may get a HA 2-3x a week.  Not all of these are a migraine however. She may use an excedrin as needed for relief.  She did use imitrex but she is out; she needs more of this. Will refill for her today. She would like to have FMLA to use as needed for her HA. She reports that she has this currently but the only set of FMLA paperwork I can find in her chart is for HSV outbreak.  She plans to get me the PPW, would prefer to have this in hours, so 16 hours a month should be fine  She has 3  children.  14, 8 and 4. She did not have BTL after her last pregnancy- she thought about it but did not do so.  She is now dating a new partner who wants to get married and have children with her.   Her mother had bipolar disorder.  She herself has had this dx in the past, but she is not sure if this is really true.  She did have OCD and suicidally in 2011; it was at this time that she was dx with bipolar disorder.  She would like to see psychiatry for a formal consultation.  At this time she feels that her mental health is good  Patient Active Problem List   Diagnosis Date Noted  . 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 Left low back pain 12/02/2014  . Right-sided chest pain 12/02/2014  . CAP (community acquired pneumonia) 11/02/2014  . Pyelonephritis 10/31/2014  . Hypokalemia 10/31/2014  . Neck pain 02/05/2014  . Migraine, unspecified, without mention of intractable migraine without mention of status migrainosus 08/14/2013  . Birth control counseling 07/16/2013  . Previous cesarean section 04/26/2012  . Palpitations 04/12/2012  .  Hx of preeclampsia, prior pregnancy, currently pregnant--had eclamptic seizure 2 weeks postpartum 02/20/2012  . History of aspiration pneumonitis 02/20/2012  . Iron deficiency anemia 02/20/2012  . Recurrent urinary tract infection 08/25/2011  . Genital herpes 06/09/2011  . Panic disorder 03/21/2011  . History of pulmonary embolus (PE) 03/17/2011  . Chronic anticoagulation 03/17/2011  . ASTHMA, INTERMITTENT 03/04/2010    Past Medical History:  Diagnosis Date  . Anemia   . Anxiety   . Asthma   . Bipolar II disorder ([place])   . Complication of anesthesia 2003   ASPIRATED DURING EMERGENCY CS  . Depression    doing ok  . Eclamptic seizure 2003  . Fibroid   . Genital herpes   . Headache(784.0)   . Herpes   . History of blood clots    x3  . History of blood transfusion MARCH 2013  . Migraines   . Personal history of PE (pulmonary embolism) x  3, latest 02/2010   noncompliant with INR checks, must limit refills  . Preeclampsia 2003   with first pregnancy  . Pregnancy induced hypertension 2003   seized 2wks after  . Pyelonephritis 2012  . Seizures ([place]) 2003   after first delivery  . Suicidal ideations   . Urinary tract infection     Past Surgical History:  Procedure Laterality Date  . CESAREAN SECTION  12/2001 and 01/2008  . CESAREAN SECTION  06/26/2012   Procedure: CESAREAN SECTION;  Surgeon: Lavonia Drafts, MD;  Location: [facility];  Service: Obstetrics;  Laterality: N/A;  . INDUCED ABORTION  01/2010    Social History  Substance Use Topics  . Smoking status: Never Smoker  . Smokeless tobacco: Never Used  . Alcohol use No    Family History  Problem Relation Age of Onset  . Diabetes Mother     Type 2  . Anemia Sister   . Hypertension Sister   . Anesthesia problems Neg Hx     No Known Allergies  Medication list has been reviewed and updated.  Current Outpatient Prescriptions on File Prior to Visit  Medication Sig Dispense Refill  . acyclovir (ZOVIRAX) 200 MG capsule Take 2 capsules (400 mg total) by mouth 2 (two) times daily. 120 capsule 5  . albuterol (VENTOLIN HFA) 108 (90 BASE) MCG/ACT inhaler Inhale 2 puffs into the lungs every 4 (four) hours as needed for wheezing or shortness of breath. For dyspnea. 1 Inhaler 3  . Caffeine-Magnesium Salicylate (DIUREX PO) Take 1 tablet by mouth every 6 (six) hours as needed (for fluid).    . SUMAtriptan (IMITREX) 25 MG tablet Take 1 tablet at start of migraine headache. If it is not resolved, you may take a 2nd tablet 2 hours later. (Patient taking differently: Take by mouth every 2 (two) hours as needed. Take 1 tablet at start of migraine headache. If it is not resolved, you may take a 2nd tablet 2 hours later.) 20 tablet 0   No current facility-administered medications on file prior to visit.     Review of Systems:  As per HPI- otherwise negative.   Vitals:    07/04/16 0946  BP: 114/72  Pulse: 87  Temp: 98.8 F (37.1 C)     Physical Examination: Vitals:   07/04/16 0946  BP: 114/72  Pulse: 87  Temp: 98.8 F (37.1 C)    There were no vitals filed for this visit. Vitals:   07/04/16 0946  Weight: 168 lb 12.8 oz (76.6 kg)  Height: 5\' 3"  (1.6  m)   Body mass index is 29.9 kg/m. Ideal Body Weight: Weight in (lb) to have BMI = 25: 140.8  GEN: WDWN, NAD, Non-toxic, A & O x 3, mild overweight, looks well HEENT: Atraumatic, Normocephalic. Neck supple. No masses, No LAD.  Bilateral TM wnl, oropharynx normal.  PEERL,EOMI.   Ears and Nose: No external deformity. CV: RRR, No M/G/R. No JVD. No thrill. No extra heart sounds. PULM: CTA B, no wheezes, crackles, rhonchi. No retractions. No resp. distress. No accessory muscle use. ABD: S, NT, ND. No rebound. No HSM. EXTR: No c/c/e NEURO Normal gait.  PSYCH: Normally interactive. Conversant. Not depressed or anxious appearing.  Calm demeanor.    Assessment and Plan: Embolism, pulmonary with infarction [facility]) - Plan: Ambulatory referral to Hematology  Pre-conception counseling - Plan: Ambulatory referral to Obstetrics / Gynecology  Other iron deficiency anemias - Plan: CBC, Comprehensive metabolic panel, Ferritin  Migraine without aura and without status migrainosus, not intractable - Plan: SUMAtriptan (IMITREX) 25 MG tablet  Episodic mood disorder (HCC)  Here today to establish care. She has a complicated PMHx for someone so young.  She is thinking about having another baby despite her history of eclampsia and multiple PE.  She clearly cannot be on estrogen (possibly could use progesterone only). In any case implored her to use contraception until she can discuss her risks further with OBG.  She agrees to use condoms and not to become pregnant unless she has considered the risks and made a conscious decision to become pregnant She has a history of multiple PE and anemia requiring blood  transfusions. She is not currently on any anticoagulation.  Will have her see endocrinology to see if we can get to the bottom of her PE.  Will send a message to Dr. Marin Olp about eliquis- ? Would he like to get labs prior to me restarting this medication Refilled her imitrex for episodic migraine HA She is interested in seeing a psychiatrist to find out more about possible bipolar disorder.  Encouraged her to do so and gave suggestions of providers.   Signed Lamar Blinks, MD

## 2016-07-07 ENCOUNTER — Ambulatory Visit: Payer: 59 | Admitting: Family Medicine

## 2016-07-07 DIAGNOSIS — Z0289 Encounter for other administrative examinations: Secondary | ICD-10-CM

## 2016-07-08 ENCOUNTER — Telehealth: Payer: Self-pay | Admitting: Internal Medicine

## 2016-07-08 NOTE — Telephone Encounter (Signed)
Relation to [id number] Call back number:[phone] Pharmacy:  Reason for call:  Patient wanted to inform PCP since last OV 07/04/16 patient had to take off a few days (FMLA paperwork) in addition patient states SUMAtriptan (IMITREX) 25 MG tablet is not working and headaches have gotten worst. Please advise

## 2016-07-09 NOTE — Telephone Encounter (Signed)
Called her and LMOM- I apologize for not calling her back sooner.  If she needs me to complete FMLA I can help her with this.  Please call me back or come and see me so we can figure out a plan for her headaches since imitrex is not helping her

## 2016-07-11 ENCOUNTER — Ambulatory Visit (INDEPENDENT_AMBULATORY_CARE_PROVIDER_SITE_OTHER): Payer: 59 | Admitting: Family Medicine

## 2016-07-11 VITALS — BP 131/83 | HR 86 | Temp 98.3°F | Ht 63.0 in | Wt 167.4 lb

## 2016-07-11 DIAGNOSIS — G43009 Migraine without aura, not intractable, without status migrainosus: Secondary | ICD-10-CM | POA: Diagnosis not present

## 2016-07-11 DIAGNOSIS — F411 Generalized anxiety disorder: Secondary | ICD-10-CM

## 2016-07-11 MED ORDER — ALPRAZOLAM 0.25 MG PO TABS: 0.2500 mg | ORAL_TABLET | Freq: Two times a day (BID) | ORAL | 0 refills | Status: DC | PRN

## 2016-07-11 MED ORDER — BUTALBITAL-APAP-CAFFEINE 50-325-40 MG PO TABS: ORAL_TABLET | ORAL | 0 refills | Status: DC

## 2016-07-11 NOTE — Patient Instructions (Addendum)
It was good to see you today Try the fioricet for your headache- remember this can make you sleep so do not use it when you need to drive.   If your headache still does not go away please let me know - go to the ER if you are getting worse or having any new or different symptoms.   For your anxiety, use the xanax sparingly as needed.  Remember this mediation can be habit forming and sedating.   I would like for you to see a psychiatrist also to more formally discuss any put a name to any mental health concerns that you may have  Do not use your fioricet and your xanax at the same time

## 2016-07-11 NOTE — Progress Notes (Signed)
Pre visit review using our clinic review tool, if applicable. No additional management support is needed unless otherwise documented below in the visit note. 

## 2016-07-11 NOTE — Progress Notes (Signed)
[place] at [facility] [address], [place], Paula Benson [address] [phone]  Date:  07/11/2016   Name:  Paula Benson   DOB:  16-Apr-1986   MRN:  [id number]  PCP:  Adin Hector, MD    Chief Complaint: Migraine (c/o migraine that has been worse over the past week. Pt would like to discuss anxiety. )   History of Present Illness:  Paula Benson is a 30 y.o. very pleasant female patient who presents with the following:  I saw this pt on 7/31 to establish care.  She has a history of migraine headache. Has used imitrex for this as needed in the past. She has an appt with Dr. Marin Olp towards the end of this month to discuss her history of multiple PE/ if she needs long term anticoagulation.   She reports that she got a migraine HA the same day that we visited on 07/04/16. She used imitrex but it did not seem to help. (Since then it seems that her HA has waxed and waned causing her to miss some hours at work on 3 other days.  She works at [employer] and brings in [employer] paperwork for me to complete today.)    She has also tried some excedrin.  She last used this medication earlier today- she took 2 aleve when she woke up, tried to go back to sleep.  She took an excedrin about lunch time but it also did not help She has not vomited but has felt nauseated.  She felt like she was intoxicated and had to pull over when she was driving to work today. She was late getting to work today because of this  She states that every year she will have a prolonged headache like this- she does not feel like this is an unusual headache but it just has not gone away yet.  Otherwise nothing about her symptoms is unusual to her No slurred speech, no difficulty moving any part of her body.  She has also suffered with anxiety in the past- she will feel "hopeless, almost, the feeling the urge to do something but I don't know what to do,  My heart beats kind of fast,  and I will feel like crying."  She has occasional panic attacks but these other symptoms are more consistent.   She has been rx medication for mental health in the past but "I didn't take it, I did not want to allow medication to control me."  She has been dx with bipolar disorder in the past and her mother also has this disease.    LMP- just finishing up now No fever, no chills, no rashes  Patient Active Problem List   Diagnosis Date Noted  . 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 Left low back pain 12/02/2014  . Right-sided chest pain 12/02/2014  . CAP (community acquired pneumonia) 11/02/2014  . Pyelonephritis 10/31/2014  . Hypokalemia 10/31/2014  . Neck pain 02/05/2014  . Migraine, unspecified, without mention of intractable migraine without mention of status migrainosus 08/14/2013  . Birth control counseling 07/16/2013  . Previous cesarean section 04/26/2012  . Palpitations 04/12/2012  . Hx of preeclampsia, prior pregnancy, currently pregnant--had eclamptic seizure 2 weeks postpartum 02/20/2012  . History of aspiration pneumonitis 02/20/2012  . Iron deficiency anemia 02/20/2012  . Recurrent urinary tract infection 08/25/2011  . Genital herpes 06/09/2011  . Panic disorder 03/21/2011  . History of pulmonary embolus (PE)  03/17/2011  . Chronic anticoagulation 03/17/2011  . ASTHMA, INTERMITTENT 03/04/2010    Past Medical History:  Diagnosis Date  . Anemia   . Anxiety   . Asthma   . Bipolar II disorder ([place])   . Complication of anesthesia 2003   ASPIRATED DURING EMERGENCY CS  . Depression    doing ok  . Eclamptic seizure 2003  . Fibroid   . Genital herpes   . Headache(784.0)   . Herpes   . History of blood clots    x3  . History of blood transfusion MARCH 2013  . Migraines   . Personal history of PE (pulmonary embolism) x 3, latest 02/2010   noncompliant with INR checks, must limit refills  . Preeclampsia 2003   with first pregnancy  . Pregnancy induced  hypertension 2003   seized 2wks after  . Pyelonephritis 2012  . Seizures ([place]) 2003   after first delivery  . Suicidal ideations   . Urinary tract infection     Past Surgical History:  Procedure Laterality Date  . CESAREAN SECTION  12/2001 and 01/2008  . CESAREAN SECTION  06/26/2012   Procedure: CESAREAN SECTION;  Surgeon: Lavonia Drafts, MD;  Location: [facility];  Service: Obstetrics;  Laterality: N/A;  . INDUCED ABORTION  01/2010    Social History  Substance Use Topics  . Smoking status: Never Smoker  . Smokeless tobacco: Never Used  . Alcohol use No    Family History  Problem Relation Age of Onset  . Diabetes Mother     Type 2  . Anemia Sister   . Hypertension Sister   . Anesthesia problems Neg Hx     No Known Allergies  Medication list has been reviewed and updated.  Current Outpatient Prescriptions on File Prior to Visit  Medication Sig Dispense Refill  . acyclovir (ZOVIRAX) 200 MG capsule Take 2 capsules (400 mg total) by mouth 2 (two) times daily. 120 capsule 5  . albuterol (VENTOLIN HFA) 108 (90 BASE) MCG/ACT inhaler Inhale 2 puffs into the lungs every 4 (four) hours as needed for wheezing or shortness of breath. For dyspnea. 1 Inhaler 3  . Caffeine-Magnesium Salicylate (DIUREX PO) Take 1 tablet by mouth every 6 (six) hours as needed (for fluid).    . SUMAtriptan (IMITREX) 25 MG tablet Take 1 tablet at start of migraine headache. If it is not resolved, you may take a 2nd tablet 2 hours later. 20 tablet 1   No current facility-administered medications on file prior to visit.     Review of Systems:  As per HPI- otherwise negative.   Physical Examination:  There were no vitals filed for this visit. Vitals:   07/11/16 1649  Weight: 167 lb 6.4 oz (75.9 kg)  Height: 5\' 3"  (1.6 m)   Body mass index is 29.65 kg/m. Ideal Body Weight: Weight in (lb) to have BMI = 25: 140.8  GEN: WDWN, NAD, Non-toxic, A & O x 3, mild overweight, looks well HEENT:  Atraumatic, Normocephalic. Neck supple. No masses, No LAD.  Bilateral TM wnl, oropharynx normal.  No meningismus.  PEERL,EOMI.   Ears and Nose: No external deformity. CV: RRR, No M/G/R. No JVD. No thrill. No extra heart sounds. PULM: CTA B, no wheezes, crackles, rhonchi. No retractions. No resp. distress. No accessory muscle use. EXTR: No c/c/e NEURO Normal gait. Normal neuro exam including strength, sensation and DTR in all extremities and romberg PSYCH: Normally interactive. Conversant. Not depressed or anxious appearing.  Calm demeanor.  Assessment and Plan: Migraine without aura and without status migrainosus, not intractable - Plan: butalbital-acetaminophen-caffeine (FIORICET, ESGIC) 50-325-40 MG tablet  GAD (generalized anxiety disorder) - Plan: ALPRAZolam (XANAX) 0.25 MG tablet  Here today to discuss her anxiety and migraine HA.   imitrex has not been helpful for her.  Will have her use fioricet as needed- she did take 2 excedrin but this should be no more than 500 mg of acetaminophen so far today Asked her to let me know if not better- discussed warning signs that should prompt her to seek emergency care for her headache such as vomiting, "worst headache of life" or any neurological changes and she agrees She has a family history of bipolar disorder and has been told that she had this herself in the past.  Thus do not want to start SSRI for her anxiety without psychiatrist supervision.  Asked her to please find and see a psychiatrist as we had discussed and she plans to do so. In the meantime she may use a small dose of xanax as needed for anxiety-  Advised her NOT to use fioricet and xanax in the same time period  Meds ordered this encounter  Medications  . butalbital-acetaminophen-caffeine (FIORICET, ESGIC) 50-325-40 MG tablet    Sig: Take 1 or 2 every 8 hours as needed for headache. Max 6 in 24 hours    Dispense:  20 tablet    Refill:  0  . ALPRAZolam (XANAX) 0.25 MG tablet     Sig: Take 1 tablet (0.25 mg total) by mouth 2 (two) times daily as needed for anxiety.    Dispense:  20 tablet    Refill:  0    See patient instructions for more details.     Signed Lamar Blinks, MD

## 2016-07-11 NOTE — Telephone Encounter (Signed)
Spoke with pt. Informed her of the below. Pt scheduled to be seen later today at 4:00.

## 2016-07-12 ENCOUNTER — Other Ambulatory Visit: Payer: Self-pay | Admitting: Family Medicine

## 2016-07-12 DIAGNOSIS — F411 Generalized anxiety disorder: Secondary | ICD-10-CM

## 2016-07-19 ENCOUNTER — Encounter: Payer: Self-pay | Admitting: Family Medicine

## 2016-07-20 ENCOUNTER — Encounter: Payer: Self-pay | Admitting: Family Medicine

## 2016-07-20 ENCOUNTER — Ambulatory Visit (INDEPENDENT_AMBULATORY_CARE_PROVIDER_SITE_OTHER): Payer: 59 | Admitting: Family Medicine

## 2016-07-20 VITALS — BP 116/82 | HR 92 | Temp 98.2°F | Ht 63.0 in | Wt 163.4 lb

## 2016-07-20 DIAGNOSIS — F39 Unspecified mood [affective] disorder: Secondary | ICD-10-CM | POA: Diagnosis not present

## 2016-07-20 DIAGNOSIS — K645 Perianal venous thrombosis: Secondary | ICD-10-CM

## 2016-07-20 NOTE — Progress Notes (Signed)
Pre visit review using our clinic review tool, if applicable. No additional management support is needed unless otherwise documented below in the visit note. 

## 2016-07-20 NOTE — Progress Notes (Signed)
[place] at [facility] [address] [place], [place] [address] [phone]  Date:  07/20/2016   Name:  Paula Benson   DOB:  [birth year]   MRN:  [id number]  PCP:  Lamar Blinks, MD    Chief Complaint: Hemorrhoids (c/o painful hemorroids pt has tried hemorroid pads which has helped. )   History of Present Illness:  Paula Benson is a 30 y.o. very pleasant female patient who presents with the following:  Seen here about 10 days ago- at that time we did FMLA paperwork for her migraine HA and also rx xanax for anxiety.  She called in and Melissa responded to her concern about the xanax not being strong enough.  We do need her to establish care with a psychiatrist which she has not done yet  She had contacted me yesterday via mychart about a hemorrhoid that she noted 2 days ago.  She had had hemorrhoids in the past but never this painful. The pain is actually better today- was much more swollen and painful yesterday, caused pain with BM She does not feel like she has been especially constipated recently although this can be an intermittent issue for her She may have bleeding with a BM off an on- she has noted this for at least 2 years.  She may have some blood in the toilet bowel, or some on the tissue.  This is not always present Never had a colonoscopy other other internal exam  LMP 8/4 Noted a few lbs of weight loss today- she admits to not eating much since the hemorrhoid developed to help avoid a BM  Wt Readings from Last 3 Encounters:  07/20/16 163 lb 6.4 oz (74.1 kg)  07/11/16 167 lb 6.4 oz (75.9 kg)  07/04/16 168 lb 12.8 oz (76.6 kg)      Patient Active Problem List   Diagnosis Date Noted  . 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 Left low back pain 12/02/2014  . Right-sided chest pain 12/02/2014  . CAP (community acquired pneumonia) 11/02/2014  . Pyelonephritis 10/31/2014  . Hypokalemia 10/31/2014  .  Neck pain 02/05/2014  . Migraine, unspecified, without mention of intractable migraine without mention of status migrainosus 08/14/2013  . Birth control counseling 07/16/2013  . Previous cesarean section 04/26/2012  . Palpitations 04/12/2012  . Hx of preeclampsia, prior pregnancy, currently pregnant--had eclamptic seizure 2 weeks postpartum 02/20/2012  . History of aspiration pneumonitis 02/20/2012  . Iron deficiency anemia 02/20/2012  . Recurrent urinary tract infection 08/25/2011  . Genital herpes 06/09/2011  . Panic disorder 03/21/2011  . History of pulmonary embolus (PE) 03/17/2011  . Chronic anticoagulation 03/17/2011  . ASTHMA, INTERMITTENT 03/04/2010    Past Medical History:  Diagnosis Date  . Anemia   . Anxiety   . Asthma   . Bipolar II disorder ([place])   . Complication of anesthesia 2003   ASPIRATED DURING EMERGENCY CS  . Depression    doing ok  . Eclamptic seizure 2003  . Fibroid   . Genital herpes   . Headache(784.0)   . Herpes   . History of blood clots    x3  . History of blood transfusion MARCH 2013  . Migraines   . Personal history of PE (pulmonary embolism) x 3, latest 02/2010   noncompliant with INR checks, must limit refills  . Preeclampsia 2003   with first pregnancy  . Pregnancy induced hypertension 2003   seized 2wks after  .  Pyelonephritis 2012  . Seizures ([place]) 2003   after first delivery  . Suicidal ideations   . Urinary tract infection     Past Surgical History:  Procedure Laterality Date  . CESAREAN SECTION  12/2001 and 01/2008  . CESAREAN SECTION  06/26/2012   Procedure: CESAREAN SECTION;  Surgeon: Lavonia Drafts, MD;  Location: [facility];  Service: Obstetrics;  Laterality: N/A;  . INDUCED ABORTION  01/2010    Social History  Substance Use Topics  . Smoking status: Never Smoker  . Smokeless tobacco: Never Used  . Alcohol use No    Family History  Problem Relation Age of Onset  . Diabetes Mother     Type 2  . Anemia Sister    . Hypertension Sister   . Anesthesia problems Neg Hx     No Known Allergies  Medication list has been reviewed and updated.  Current Outpatient Prescriptions on File Prior to Visit  Medication Sig Dispense Refill  . acyclovir (ZOVIRAX) 200 MG capsule Take 2 capsules (400 mg total) by mouth 2 (two) times daily. 120 capsule 5  . albuterol (VENTOLIN HFA) 108 (90 BASE) MCG/ACT inhaler Inhale 2 puffs into the lungs every 4 (four) hours as needed for wheezing or shortness of breath. For dyspnea. 1 Inhaler 3  . ALPRAZolam (XANAX) 0.25 MG tablet Take 1 tablet (0.25 mg total) by mouth 2 (two) times daily as needed for anxiety. 20 tablet 0  . butalbital-acetaminophen-caffeine (FIORICET, ESGIC) 50-325-40 MG tablet Take 1 or 2 every 8 hours as needed for headache. Max 6 in 24 hours 20 tablet 0  . Caffeine-Magnesium Salicylate (DIUREX PO) Take 1 tablet by mouth every 6 (six) hours as needed (for fluid).    . SUMAtriptan (IMITREX) 25 MG tablet Take 1 tablet at start of migraine headache. If it is not resolved, you may take a 2nd tablet 2 hours later. 20 tablet 1   No current facility-administered medications on file prior to visit.     Review of Systems:  As per HPI- otherwise negative. No fever, chills, vomiting, diarrhea   Physical Examination: Vitals:   07/20/16 1132  BP: 116/82  Pulse: 92  Temp: 98.2 F (36.8 C)    Vitals:   07/20/16 1132  Weight: 163 lb 6.4 oz (74.1 kg)  Height: 5\' 3"  (1.6 m)   Body mass index is 28.95 kg/m. Ideal Body Weight: Weight in (lb) to have BMI = 25: 140.8  GEN: WDWN, NAD, Non-toxic, A & O x 3, overweight, looks well HEENT: Atraumatic, Normocephalic. Neck supple. No masses, No LAD. Ears and Nose: No external deformity. CV: RRR, No M/G/R. No JVD. No thrill. No extra heart sounds. PULM: CTA B, no wheezes, crackles, rhonchi. No retractions. No resp. distress. No accessory muscle use. ABD: S, NT, ND. No rebound. No HSM.  Benign belly EXTR: No  c/c/e NEURO Normal gait.  PSYCH: Normally interactive. Conversant. Not depressed or anxious appearing.  Calm demeanor.  Rectal: there is an external hemorrhoid at 9:00 which is firm and tender, likely thrombosed.  Offered observation vs I and D today- she would like to incise the hemorrhoid in hopes of removing clot  VC obtained.  Prepped area with betadine, anesthesia with 15ml of 1% lido.  Used 11 blade to incise over hemorrhoid- only a tiny amount of clotted blood was present.  Tucked gauze in between buttocks and pt rested for about 5 minutes.  Hemostasis achieved  Assessment and Plan: Hemorrhoid thrombosis  Episodic mood disorder ([place])  Treated hemorrhoid as above, see care instructions below Continued to encourage her to establish care with a psychiatrist Offered a GI referral to make sure there is no other cause of her rectal bleeding.  She declines at this time She did have anemia on recent CBC but she reports longstanding anemia.  Will see Dr. Marin Olp in about 1 week  We incised a hemorrhoid today in hopes of removing any clotted blood.   Keep a gauze in between your buttocks for the next few hours until any bleeding stops.  After that you may want to wear a pad for a couple of days in case of any oozing.  However if you have any significant bleeding (more than a few drops) or bleeding that does not stop in 36 hours please let me know  Sit in a warm bath tub a few times a day for 5- 10 min over the next couple of days.    Please do keep an eye on your weight and let me know if you continue to lose without trying  Please do contact the psychiatrist of your choice soon so you can be seen   A stool softener such as colace would be good to use for the next few days   Signed Lamar Blinks, MD

## 2016-07-20 NOTE — Patient Instructions (Signed)
We incised a hemorrhoid today in hopes of removing any clotted blood.   Keep a gauze in between your buttocks for the next few hours until any bleeding stops.  After that you may want to wear a pad for a couple of days in case of any oozing.  However if you have any significant bleeding (more than a few drops) or bleeding that does not stop in 36 hours please let me know  Sit in a warm bath tub a few times a day for 5- 10 min over the next couple of days.    Please do keep an eye on your weight and let me know if you continue to lose without trying  Please do contact the psychiatrist of your choice soon so you can be seen   A stool softener such as colace would be good to use for the next few days

## 2016-07-27 ENCOUNTER — Other Ambulatory Visit (HOSPITAL_BASED_OUTPATIENT_CLINIC_OR_DEPARTMENT_OTHER): Payer: 59

## 2016-07-27 ENCOUNTER — Encounter: Payer: Self-pay | Admitting: Hematology & Oncology

## 2016-07-27 ENCOUNTER — Encounter: Payer: Self-pay | Admitting: Family Medicine

## 2016-07-27 ENCOUNTER — Ambulatory Visit (HOSPITAL_BASED_OUTPATIENT_CLINIC_OR_DEPARTMENT_OTHER): Payer: 59 | Admitting: Hematology & Oncology

## 2016-07-27 ENCOUNTER — Ambulatory Visit: Payer: 59

## 2016-07-27 VITALS — BP 133/84 | HR 104 | Temp 98.5°F | Resp 18 | Ht 63.0 in | Wt 165.0 lb

## 2016-07-27 DIAGNOSIS — N921 Excessive and frequent menstruation with irregular cycle: Secondary | ICD-10-CM

## 2016-07-27 DIAGNOSIS — D509 Iron deficiency anemia, unspecified: Secondary | ICD-10-CM

## 2016-07-27 DIAGNOSIS — Z7901 Long term (current) use of anticoagulants: Secondary | ICD-10-CM

## 2016-07-27 DIAGNOSIS — N920 Excessive and frequent menstruation with regular cycle: Secondary | ICD-10-CM | POA: Diagnosis not present

## 2016-07-27 DIAGNOSIS — I2699 Other pulmonary embolism without acute cor pulmonale: Secondary | ICD-10-CM | POA: Diagnosis not present

## 2016-07-27 DIAGNOSIS — D500 Iron deficiency anemia secondary to blood loss (chronic): Secondary | ICD-10-CM

## 2016-07-27 DIAGNOSIS — Z86711 Personal history of pulmonary embolism: Secondary | ICD-10-CM

## 2016-07-27 HISTORY — DX: Excessive and frequent menstruation with irregular cycle: N92.1

## 2016-07-27 NOTE — Progress Notes (Signed)
Referral MD  Reason for Referral: Recurrent pulmonary emboli     Iron deficiency anemia /menometrorrhagia   Chief Complaint  Patient presents with  . Other    New Patient  : I had blood clots in my lung.  HPI: Paula Benson is a very charming 30 year old African-American female. She is originally from [place]. She's been down here for several years.  She had her first pulmonary embolism after she had her first child at age 65. This was back in 2003. She apparently aspirated and was hospitalized for quite a while. She developed a pulmonary embolism. She does not remember a lot about it. She was on blood thinner with Coumadin. She's not sure how long she was on blood thinner.  She had her second child in 2009. She'll third child in 2013.  It's still not totally clear when she had her last pulmonary embolism. I'm sure she had one after she had her second child. She was on Lovenox during the pregnancy. It does not sound I she was on Lovenox for that long after the delivery.  She moved to the [place] area in I think 2011. She has had multiple CT angiograms. All the ones that she had since 2012 have been negative for any pulmonary emboli.  She had been on ELIQUIS. She was having problems with pain for this. She had to stop taking this. She now has insurance and is back on ELIQUIS. She has iron deficiency anemia. She has heavy cycles. She has been on oral iron which she cannot tolerate because of abdominal pain.  She has had some migraines.  There is no family history of blood clots. There is no family history of malignancy.  She has had some hypercoagulable studies done. The studies were done back in 2013. The studies did not show any obvious hypercoagulable problem. She never had a protein C or protein S level done.  She's had no chest wall pain. She's had no cough or shortness of breath.  He is working. She's had no leg pain. She did have a Doppler of her legs back in November 2015.  This was negative for any thromboembolic disease.  She was kindly referred to the [facility] to try to help out with long-term anticoagulation management.  She does not smoke. 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  She really does not drink. She does not take any kind of supplements.  There is no history of sickle cell. There is no history of diabetes.  Her 3 daughters are healthy. She basically had normal pregnancies.  Currently, her performance status is ECOG 0.             Past Medical History:  Diagnosis Date  . Anemia   . Anxiety   . Asthma   . Bipolar II disorder ([place])   . Complication of anesthesia 2003   ASPIRATED DURING EMERGENCY CS  . Depression    doing ok  . Eclamptic seizure 2003  . Fibroid   . Genital herpes   . Headache(784.0)   . Herpes   . History of blood clots    x3  . History of blood transfusion MARCH 2013  . Migraines   . Personal history of PE (pulmonary embolism) x 3, latest 02/2010   noncompliant with INR checks, must limit refills  . Preeclampsia 2003   with first pregnancy  . Pregnancy induced hypertension 2003   seized 2wks after  . Pyelonephritis 2012  . Seizures (  [place]) 2003   after first delivery  . Suicidal ideations   . Urinary tract infection   :  Past Surgical History:  Procedure Laterality Date  . CESAREAN SECTION  12/2001 and 01/2008  . CESAREAN SECTION  06/26/2012   Procedure: CESAREAN SECTION;  Surgeon: Lavonia Drafts, MD;  Location: [facility];  Service: Obstetrics;  Laterality: N/A;  . INDUCED ABORTION  01/2010  :   Current Outpatient Prescriptions:  .  acyclovir (ZOVIRAX) 200 MG capsule, Take 2 capsules (400 mg total) by mouth 2 (two) times daily., Disp: 120 capsule, Rfl: 5 .  albuterol (VENTOLIN HFA) 108 (90 BASE) MCG/ACT inhaler, Inhale 2 puffs into the lungs every 4 (four) hours as needed for wheezing or shortness of breath. For dyspnea., Disp: 1 Inhaler, Rfl: 3 .  ALPRAZolam (XANAX) 0.25  MG tablet, Take 1 tablet (0.25 mg total) by mouth 2 (two) times daily as needed for anxiety., Disp: 20 tablet, Rfl: 0 .  butalbital-acetaminophen-caffeine (FIORICET, ESGIC) 50-325-40 MG tablet, Take 1 or 2 every 8 hours as needed for headache. Max 6 in 24 hours, Disp: 20 tablet, Rfl: 0 .  Caffeine-Magnesium Salicylate (DIUREX PO), Take 1 tablet by mouth every 6 (six) hours as needed (for fluid)., Disp: , Rfl:  .  SUMAtriptan (IMITREX) 25 MG tablet, Take 1 tablet at start of migraine headache. If it is not resolved, you may take a 2nd tablet 2 hours later., Disp: 20 tablet, Rfl: 1:  :  No Known Allergies:  Family History  Problem Relation Age of Onset  . Diabetes Mother     Type 2  . Anemia Sister   . Hypertension Sister   . Anesthesia problems Neg Hx   :  Social History   Social History  . Marital status: Single    Spouse name: N/A  . Number of children: N/A  . Years of education: N/A   Occupational History  . Not on file.   Social History Main Topics  . Smoking status: Never Smoker  . Smokeless tobacco: Never Used  . Alcohol use No  . Drug use: No  . Sexual activity: Yes    Birth control/ protection: Condom   Other Topics Concern  . Not on file   Social History Narrative   Student. Plans on staying here in [place] (originally living in [place]). 2 daughters.  :  Pertinent items are noted in HPI.  Exam: @IPVITALS @  Well-developed and well-nourished African-American female in no obvious distress. Vital signs are temperature of 98.5. Pulse 104. Blood pressure 133/84. Weight is 165 pounds. Head and neck exam shows no ocular or oral lesions. She has no palpable cervical or supraclavicular lymph nodes. Lungs are clear to percussion and auscultation bilaterally. There are no rales, wheezes or rhonchi. Cardiac exam regular rate and rhythm with no murmurs, rubs or bruits. Abdomen is soft. Has good bowel sounds. There is no fluid wave. There is no palpable liver or spleen  tip. Back exam shows no tenderness over the spine, ribs or hips. External shows no clubbing, cyanosis or edema. She has good range of motion of her joints. There is no palpable venous cord in her legs. She has a negative Homans sign in her legs. Skin exam shows no rashes, ecchymoses or petechia.   No results for input(s): WBC, HGB, HCT, PLT in the last 72 hours. No results for input(s): NA, K, CL, CO2, GLUCOSE, BUN, CREATININE, CALCIUM in the last 72 hours.  Blood smear review:  None  Pathology:  None     Assessment and Plan:  Paula Benson is a very nice 30 year old African-American female. She has a history of recurrent pulmonary emboli. We do not have any records from [place].  Since she's been down here, I cannot see where she is had a pulmonary embolism.  I am sending off a Protein C and Protein S level on her. We'll see what this shows.  She is not planning on getting pregnant in the future although this is not out of the realm of possibility if she does get married.  I think we probably have to keep her on the full dose Xarelto for right now. However, at some point, we probably can get her on a maintenance type dose which I think would be helpful.  She definitely is iron deficient anemic. Her ferritin back 3 weeks ago was 4.9. I will see about getting her set up with IV iron. She probably will need 2 doses of the IV iron.  I don't think we have to do any scans on her. Again she's had multiple CT angiograms which of all been negative.  I would hate to commit her to full dose anticoagulation. She is only 30 years old.  I would like to see her back here in about 6 weeks. At that point, , we might want to consider a maintenance dose of ELIQUIS said 2.5 mg by mouth twice a day.  She is incredibly fascinating. I had a whole lot of fun talking with her. She is very eloquent.

## 2016-07-28 ENCOUNTER — Other Ambulatory Visit: Payer: Self-pay | Admitting: Family Medicine

## 2016-07-28 DIAGNOSIS — F411 Generalized anxiety disorder: Secondary | ICD-10-CM

## 2016-07-28 LAB — PROTEIN S ACTIVITY: Protein S-Functional: 86 % (ref 63–140)

## 2016-07-28 LAB — PROTEIN C, TOTAL: Protein C Antigen: 94 % (ref 60–150)

## 2016-07-28 LAB — PROTEIN C ACTIVITY: Protein C-Functional: 141 % (ref 73–180)

## 2016-07-28 LAB — PROTEIN S, TOTAL: Protein S, Total: 138 % (ref 60–150)

## 2016-07-28 MED ORDER — ALPRAZOLAM 0.25 MG PO TABS: 0.2500 mg | ORAL_TABLET | Freq: Two times a day (BID) | ORAL | 0 refills | Status: DC | PRN

## 2016-07-29 LAB — CARDIOLIPIN ANTIBODIES, IGG, IGM, IGA: Anticardiolipin Ab,IgA,Qn: <9 APL U/mL (ref 0–11)

## 2016-07-29 LAB — BETA-2-GLYCOPROTEIN I ABS, IGG/M/A: Beta-2 Glyco 1 IgM: <9 GPI IgM units (ref 0–32)

## 2016-08-01 ENCOUNTER — Institutional Professional Consult (permissible substitution): Payer: 59 | Admitting: Obstetrics & Gynecology

## 2016-08-01 DIAGNOSIS — Z3169 Encounter for other general counseling and advice on procreation: Secondary | ICD-10-CM

## 2016-08-04 ENCOUNTER — Ambulatory Visit (HOSPITAL_BASED_OUTPATIENT_CLINIC_OR_DEPARTMENT_OTHER): Payer: 59

## 2016-08-04 ENCOUNTER — Ambulatory Visit: Payer: 59

## 2016-08-04 VITALS — BP 126/78 | HR 72 | Temp 98.4°F | Resp 20

## 2016-08-04 DIAGNOSIS — D509 Iron deficiency anemia, unspecified: Secondary | ICD-10-CM

## 2016-08-04 DIAGNOSIS — Z7901 Long term (current) use of anticoagulants: Secondary | ICD-10-CM

## 2016-08-04 DIAGNOSIS — N921 Excessive and frequent menstruation with irregular cycle: Secondary | ICD-10-CM

## 2016-08-04 DIAGNOSIS — N920 Excessive and frequent menstruation with regular cycle: Secondary | ICD-10-CM | POA: Diagnosis not present

## 2016-08-04 DIAGNOSIS — D500 Iron deficiency anemia secondary to blood loss (chronic): Secondary | ICD-10-CM

## 2016-08-04 MED ORDER — SODIUM CHLORIDE 0.9 % IV SOLN
510.0000 mg | Freq: Once | INTRAVENOUS | Status: AC
  Administered 2016-08-04: 510 mg via INTRAVENOUS
  Filled 2016-08-04: qty 17

## 2016-08-04 MED ORDER — SODIUM CHLORIDE 0.9 % IV SOLN
Freq: Once | INTRAVENOUS | Status: AC
  Administered 2016-08-04: 11:00:00 via INTRAVENOUS

## 2016-08-04 NOTE — Patient Instructions (Signed)

## 2016-08-10 ENCOUNTER — Encounter: Payer: Self-pay | Admitting: Family Medicine

## 2016-08-10 ENCOUNTER — Ambulatory Visit: Payer: 59 | Admitting: Physician Assistant

## 2016-08-11 ENCOUNTER — Ambulatory Visit: Payer: 59 | Admitting: Family Medicine

## 2016-08-11 ENCOUNTER — Ambulatory Visit (INDEPENDENT_AMBULATORY_CARE_PROVIDER_SITE_OTHER): Payer: 59 | Admitting: Family Medicine

## 2016-08-11 ENCOUNTER — Ambulatory Visit (HOSPITAL_BASED_OUTPATIENT_CLINIC_OR_DEPARTMENT_OTHER): Payer: 59

## 2016-08-11 VITALS — BP 128/75 | HR 90 | Temp 99.4°F | Ht 63.0 in | Wt 167.0 lb

## 2016-08-11 VITALS — BP 108/75 | HR 74 | Temp 98.0°F | Resp 18

## 2016-08-11 DIAGNOSIS — N921 Excessive and frequent menstruation with irregular cycle: Secondary | ICD-10-CM

## 2016-08-11 DIAGNOSIS — D500 Iron deficiency anemia secondary to blood loss (chronic): Secondary | ICD-10-CM

## 2016-08-11 DIAGNOSIS — N920 Excessive and frequent menstruation with regular cycle: Secondary | ICD-10-CM | POA: Diagnosis not present

## 2016-08-11 DIAGNOSIS — N10 Acute tubulo-interstitial nephritis: Secondary | ICD-10-CM

## 2016-08-11 DIAGNOSIS — Z7901 Long term (current) use of anticoagulants: Secondary | ICD-10-CM

## 2016-08-11 DIAGNOSIS — D509 Iron deficiency anemia, unspecified: Secondary | ICD-10-CM

## 2016-08-11 LAB — POC URINALSYSI DIPSTICK (AUTOMATED)
BILIRUBIN UA: NEGATIVE
Blood, UA: NEGATIVE
Glucose, UA: NEGATIVE
KETONES UA: NEGATIVE
Nitrite, UA: NEGATIVE
PROTEIN UA: NEGATIVE
Urobilinogen, UA: NEGATIVE
pH, UA: 6

## 2016-08-11 LAB — POCT URINE PREGNANCY: PREG TEST UR: NEGATIVE

## 2016-08-11 MED ORDER — CIPROFLOXACIN HCL 500 MG PO TABS: 500.0000 mg | ORAL_TABLET | Freq: Two times a day (BID) | ORAL | 0 refills | Status: DC

## 2016-08-11 MED ORDER — SODIUM CHLORIDE 0.9 % IV SOLN
Freq: Once | INTRAVENOUS | Status: AC
  Administered 2016-08-11: 13:00:00 via INTRAVENOUS

## 2016-08-11 MED ORDER — CEFTRIAXONE SODIUM 1 G IJ SOLR
1.0000 g | Freq: Once | INTRAMUSCULAR | Status: AC
  Administered 2016-08-11: 1 g via INTRAMUSCULAR

## 2016-08-11 MED ORDER — SODIUM CHLORIDE 0.9 % IV SOLN
510.0000 mg | Freq: Once | INTRAVENOUS | Status: AC
  Administered 2016-08-11: 510 mg via INTRAVENOUS
  Filled 2016-08-11: qty 17

## 2016-08-11 NOTE — Patient Instructions (Addendum)
We are going to treat you for a urinary tract/ early kidney infection with a shot of rocephin and cipro (oral antibiotic). Start the cipro today Drink plenty of fluids. However, if you are not improving or if you are getting worse you do need to seek further care right away! If you do not get better got to the ER for further treatment and IV fluids.  I will culture your urine for you to make sure you are on the right antibiotic.

## 2016-08-11 NOTE — Progress Notes (Signed)
Pre visit review using our clinic review tool, if applicable. No additional management support is needed unless otherwise documented below in the visit note. 

## 2016-08-11 NOTE — Patient Instructions (Signed)

## 2016-08-11 NOTE — Telephone Encounter (Signed)
Pt scheduled w/ PCP.

## 2016-08-11 NOTE — Progress Notes (Addendum)
[place] at [facility] [address], [place], [place] [address] [phone]  Date:  08/11/2016   Name:  Paula Benson   DOB:  [birth year]   MRN:  [id number]  PCP:  Lamar Blinks, MD    Chief Complaint: Urinary Tract Infection (c/o increased frequency and urgency. Dark urine and kidney pain that started last week. hx of kidney infections. Pt states that she has trouble holding her urine and has had a few accidents. Will need refill on Acycla)   History of Present Illness:  Paula Benson is a 30 y.o. very pleasant female patient who presents with the following:  Here today with concern about UTI.  She has noted possible UTI sx for a couple of weeks, but as felt worse over the last day or so.  She noted lower back pain that "woke me out of my sleep" this morning, she took an aleve.  She has had a lot of UTIs and "kidney infections" in the past.  She had to be hospitalized with a kidney infection back in 2015  She did have an iron infusion last week, and since then her urine has seemed dark.  Had another infusion today  She notes pain with urination and the feeling that she has to go "all the time" She is not aware of any blood in her urine She felt nauseated yesterday but no fever No vomiting.    She has a history of eclamptic seizures but otherwise does not have a seizure disorder  LMP was just last week.  For the time being she is not on anticoagulation. She also had an iron infusion today  Pulse Readings from Last 3 Encounters:  08/11/16 (!) 113  08/11/16 74  08/04/16 72     Patient Active Problem List   Diagnosis Date Noted  . Menometrorrhagia 07/27/2016  . 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 Left low back pain 12/02/2014  . Right-sided chest pain 12/02/2014  . CAP (community acquired pneumonia) 11/02/2014  . Pyelonephritis 10/31/2014  . Hypokalemia 10/31/2014  . Neck pain 02/05/2014  . Migraine,  unspecified, without mention of intractable migraine without mention of status migrainosus 08/14/2013  . Birth control counseling 07/16/2013  . Previous cesarean section 04/26/2012  . Palpitations 04/12/2012  . Hx of preeclampsia, prior pregnancy, currently pregnant--had eclamptic seizure 2 weeks postpartum 02/20/2012  . History of aspiration pneumonitis 02/20/2012  . Iron deficiency anemia 02/20/2012  . Recurrent urinary tract infection 08/25/2011  . Genital herpes 06/09/2011  . Panic disorder 03/21/2011  . History of pulmonary embolus (PE) 03/17/2011  . Chronic anticoagulation 03/17/2011  . ASTHMA, INTERMITTENT 03/04/2010    Past Medical History:  Diagnosis Date  . Anemia   . Anxiety   . Asthma   . Bipolar II disorder ([place])   . Complication of anesthesia 2003   ASPIRATED DURING EMERGENCY CS  . Depression    doing ok  . Eclamptic seizure 2003  . Fibroid   . Genital herpes   . Headache(784.0)   . Herpes   . History of blood clots    x3  . History of blood transfusion MARCH 2013  . Menometrorrhagia 07/27/2016  . Migraines   . Personal history of PE (pulmonary embolism) x 3, latest 02/2010   noncompliant with INR checks, must limit refills  . Preeclampsia 2003   with first pregnancy  . Pregnancy induced hypertension 2003   seized 2wks after  . Pyelonephritis  2012  . Seizures ([place]) 2003   after first delivery  . Suicidal ideations   . Urinary tract infection     Past Surgical History:  Procedure Laterality Date  . CESAREAN SECTION  12/2001 and 01/2008  . CESAREAN SECTION  06/26/2012   Procedure: CESAREAN SECTION;  Surgeon: Lavonia Drafts, MD;  Location: [facility];  Service: Obstetrics;  Laterality: N/A;  . INDUCED ABORTION  01/2010    Social History  Substance Use Topics  . Smoking status: Never Smoker  . Smokeless tobacco: Never Used  . Alcohol use No    Family History  Problem Relation Age of Onset  . Diabetes Mother     Type 2  . Anemia Sister   .  Hypertension Sister   . Anesthesia problems Neg Hx     No Known Allergies  Medication list has been reviewed and updated.  Current Outpatient Prescriptions on File Prior to Visit  Medication Sig Dispense Refill  . acyclovir (ZOVIRAX) 200 MG capsule Take 2 capsules (400 mg total) by mouth 2 (two) times daily. 120 capsule 5  . albuterol (VENTOLIN HFA) 108 (90 BASE) MCG/ACT inhaler Inhale 2 puffs into the lungs every 4 (four) hours as needed for wheezing or shortness of breath. For dyspnea. 1 Inhaler 3  . ALPRAZolam (XANAX) 0.25 MG tablet Take 1 tablet (0.25 mg total) by mouth 2 (two) times daily as needed for anxiety. 20 tablet 0  . butalbital-acetaminophen-caffeine (FIORICET, ESGIC) 50-325-40 MG tablet Take 1 or 2 every 8 hours as needed for headache. Max 6 in 24 hours 20 tablet 0  . Caffeine-Magnesium Salicylate (DIUREX PO) Take 1 tablet by mouth every 6 (six) hours as needed (for fluid).    . SUMAtriptan (IMITREX) 25 MG tablet Take 1 tablet at start of migraine headache. If it is not resolved, you may take a 2nd tablet 2 hours later. 20 tablet 1   No current facility-administered medications on file prior to visit.     Review of Systems:  As per HPI- otherwise negative. No vaginal sx   Physical Examination: Vitals:   08/11/16 1814  BP: 128/75  Pulse: (!) 113  Temp: 99.4 F (37.4 C)   Vitals:   08/11/16 1814  Weight: 167 lb (75.8 kg)  Height: 5\' 3"  (1.6 m)   Body mass index is 29.58 kg/m. Ideal Body Weight: Weight in (lb) to have BMI = 25: 140.8  GEN: WDWN, NAD, Non-toxic, A & O x 3, overweight, looks well.  Here with her daughter today HEENT: Atraumatic, Normocephalic. Neck supple. No masses, No LAD. Ears and Nose: No external deformity. CV: RRR, No M/G/R. No JVD. No thrill. No extra heart sounds. PULM: CTA B, no wheezes, crackles, rhonchi. No retractions. No resp. distress. No accessory muscle use. ABD: S,  ND, +BS. No rebound. No HSM.  She notes minimal suprapubic  tenderness.  No CVA tenderness  EXTR: No c/c/e NEURO Normal gait.  PSYCH: Normally interactive. Conversant. Not depressed or anxious appearing.  Calm demeanor.   Recheck pulse during exam 90- 95 BPM Results for orders placed or performed in visit on 08/11/16  POCT urine pregnancy  Result Value Ref Range   Preg Test, Ur Negative Negative  POCT Urinalysis Dipstick (Automated)  Result Value Ref Range   Color, UA Yellow    Clarity, UA Clear    Glucose, UA negative    Bilirubin, UA negative    Ketones, UA negative    Spec Grav, UA >=1.030  Blood, UA negative    pH, UA 6.0    Protein, UA negative    Urobilinogen, UA negative    Nitrite, UA negative    Leukocytes, UA small (1+) (A) Negative   Assessment and Plan: Acute pyelonephritis - Plan: Urine Culture, cefTRIAXone (ROCEPHIN) injection 1 g, ciprofloxacin (CIPRO) 500 MG tablet, POCT urine pregnancy, POCT Urinalysis Dipstick (Automated)  Here today with likely UTI vs developing pyelonephritis.  Discussed options in detail with pt; offered to have her seen in the ER for further treatment and likely IVF.  Alternatively we can treat her at home with the understanding that if she is not rapidly improving she will need to come back.  As she has her child with her she declines ER care at this time.  Given 1gm of rocephin IM and an rx for cipro 500 BID.  Advised to push fluids, rest, and follow-up closely if not improving within 12 -24 hours.  She states understanding and agreement with the plan   Meds ordered this encounter  Medications  . cefTRIAXone (ROCEPHIN) injection 1 g  . ciprofloxacin (CIPRO) 500 MG tablet    Sig: Take 1 tablet (500 mg total) by mouth 2 (two) times daily.    Dispense:  20 tablet    Refill:  0     Signed Lamar Blinks, MD  Received her urine culture on 9/11- E coli resistant to cipro. Called pt- had to [facility]. She does have a UTI resistant to cipro. Stop cipro and change to augmentin which I will send to her  drug store.  Please let me know how she is feeling   Results for orders placed or performed in visit on 08/11/16  Urine Culture  Result Value Ref Range   Culture ESCHERICHIA COLI    Colony Count >=100,000 COLONIES/ML    Organism ID, Bacteria ESCHERICHIA COLI       Susceptibility   Escherichia coli -  (no method available)    AMPICILLIN >=32 Resistant     AMOX/CLAVULANIC 8 Sensitive     AMPICILLIN/SULBACTAM 16 Intermediate     PIP/TAZO <=4 Sensitive     IMIPENEM <=0.25 Sensitive     CEFAZOLIN <=4 Not Reportable     CEFTRIAXONE <=1 Sensitive     CEFTAZIDIME <=1 Sensitive     CEFEPIME <=1 Sensitive     GENTAMICIN >=16 Resistant     TOBRAMYCIN 8 Intermediate     CIPROFLOXACIN >=4 Resistant     LEVOFLOXACIN >=8 Resistant     NITROFURANTOIN <=16 Sensitive     TRIMETH/SULFA* >=320 Resistant      * NR=NOT REPORTABLE,SEE COMMENTORAL therapy:A cefazolin MIC of <32 predicts susceptibility to the oral agents cefaclor,cefdinir,cefpodoxime,cefprozil,cefuroxime,cephalexin,and loracarbef when used for therapy of uncomplicated UTIs due to E.coli,K.pneumomiae,and P.mirabilis. PARENTERAL therapy: A cefazolinMIC of >8 indicates resistance to parenteralcefazolin. An alternate test method must beperformed to confirm susceptibility to parenteralcefazolin.  POCT urine pregnancy  Result Value Ref Range   Preg Test, Ur Negative Negative  POCT Urinalysis Dipstick (Automated)  Result Value Ref Range   Color, UA Yellow    Clarity, UA Clear    Glucose, UA negative    Bilirubin, UA negative    Ketones, UA negative    Spec Grav, UA >=1.030    Blood, UA negative    pH, UA 6.0    Protein, UA negative    Urobilinogen, UA negative    Nitrite, UA negative    Leukocytes, UA small (1+) (A) Negative

## 2016-08-12 ENCOUNTER — Ambulatory Visit: Payer: 59

## 2016-08-14 LAB — URINE CULTURE

## 2016-08-15 ENCOUNTER — Encounter: Payer: Self-pay | Admitting: Family Medicine

## 2016-08-15 MED ORDER — AMOXICILLIN-POT CLAVULANATE 500-125 MG PO TABS
1.0000 | ORAL_TABLET | Freq: Two times a day (BID) | ORAL | 0 refills | Status: DC
Start: 2016-08-15 — End: 2016-08-24

## 2016-08-15 NOTE — Addendum Note (Signed)
Addended by: Lamar Blinks C on: 08/15/2016 08:59 AM   Modules accepted: Orders

## 2016-08-22 ENCOUNTER — Encounter: Payer: Self-pay | Admitting: Family Medicine

## 2016-08-22 ENCOUNTER — Ambulatory Visit (INDEPENDENT_AMBULATORY_CARE_PROVIDER_SITE_OTHER): Payer: 59 | Admitting: Family Medicine

## 2016-08-22 ENCOUNTER — Ambulatory Visit: Payer: 59 | Admitting: Family Medicine

## 2016-08-22 VITALS — BP 110/70 | HR 85 | Temp 98.7°F | Ht 63.0 in | Wt 164.0 lb

## 2016-08-22 DIAGNOSIS — N926 Irregular menstruation, unspecified: Secondary | ICD-10-CM

## 2016-08-22 DIAGNOSIS — R300 Dysuria: Secondary | ICD-10-CM

## 2016-08-22 DIAGNOSIS — N3941 Urge incontinence: Secondary | ICD-10-CM | POA: Diagnosis not present

## 2016-08-22 LAB — POCT URINALYSIS DIP (MANUAL ENTRY)
Bilirubin, UA: NEGATIVE
GLUCOSE UA: NEGATIVE
Ketones, POC UA: NEGATIVE
LEUKOCYTES UA: NEGATIVE
NITRITE UA: NEGATIVE
Protein Ur, POC: NEGATIVE
RBC UA: NEGATIVE
Spec Grav, UA: >=1.03
UROBILINOGEN UA: NEGATIVE
pH, UA: 6

## 2016-08-22 LAB — POCT URINE PREGNANCY: Preg Test, Ur: NEGATIVE

## 2016-08-22 NOTE — Progress Notes (Addendum)
[place] at [facility] [address] [place], [place] [address] [phone]  Date:  08/22/2016   Name:  Paula Benson   DOB:  04-05-1986   MRN:  [id number]  PCP:  Paula Blinks, MD    Chief Complaint: Urinary Problem (c/o having a few episode of urinary incontience. )   History of Present Illness:  Paula Benson is a 30 y.o. very pleasant female patient who presents with the following:  I saw this pt about 10 days ago with complaint of possible pyelo- HPI as follows Here today with concern about UTI.  She has noted possible UTI sx for a couple of weeks, but as felt worse over the last day or so.  She noted lower back pain that "woke me out of my sleep" this morning, she took an aleve.  She has had a lot of UTIs and "kidney infections" in the past.  She had to be hospitalized with a kidney infection back in 2015  She did have an iron infusion last week, and since then her urine has seemed dark.  Had another infusion today  She notes pain with urination and the feeling that she has to go "all the time" She is not aware of any blood in her urine She felt nauseated yesterday but no fever No vomiting.    We started her on cipro, but her bacteria was resistant to this and we changed her to augmentin. She then contacted me with concern of urinary leakage-  She is tolerating augmentin ok.   However 3 days ago she had an episode of urge incont. She felt like she had to get to the bathroom, but then she wet down her leg, states that she did not feel herself urinate.  She still has to urinate when she got to the bathroom She has not otherwise noted any numbness or weakness of her leg She has noted "a little" back pain but nothing out of the ordinary  She states that "I've just noticed over time that my ability to hold it is not as good," this has been the case for about one year if not longer.   No leaking with cough or sneeze- she  just has urge incont where she sometimes fears she will not make it to the bathroom in time   Patient Active Problem List   Diagnosis Date Noted  . Menometrorrhagia 07/27/2016  . 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 Left low back pain 12/02/2014  . Right-sided chest pain 12/02/2014  . CAP (community acquired pneumonia) 11/02/2014  . Pyelonephritis 10/31/2014  . Hypokalemia 10/31/2014  . Neck pain 02/05/2014  . Migraine, unspecified, without mention of intractable migraine without mention of status migrainosus 08/14/2013  . Birth control counseling 07/16/2013  . Previous cesarean section 04/26/2012  . Palpitations 04/12/2012  . Hx of preeclampsia, prior pregnancy, currently pregnant--had eclamptic seizure 2 weeks postpartum 02/20/2012  . History of aspiration pneumonitis 02/20/2012  . Iron deficiency anemia 02/20/2012  . Recurrent urinary tract infection 08/25/2011  . Genital herpes 06/09/2011  . Panic disorder 03/21/2011  . History of pulmonary embolus (PE) 03/17/2011  . Chronic anticoagulation 03/17/2011  . ASTHMA, INTERMITTENT 03/04/2010    Past Medical History:  Diagnosis Date  . Anemia   . Anxiety   . Asthma   . Bipolar II disorder ([place])   . Complication of anesthesia 2003   ASPIRATED DURING EMERGENCY CS  . Depression  doing ok  . Eclamptic seizure 2003  . Fibroid   . Genital herpes   . Headache(784.0)   . Herpes   . History of blood clots    x3  . History of blood transfusion MARCH 2013  . Menometrorrhagia 07/27/2016  . Migraines   . Personal history of PE (pulmonary embolism) x 3, latest 02/2010   noncompliant with INR checks, must limit refills  . Preeclampsia 2003   with first pregnancy  . Pregnancy induced hypertension 2003   seized 2wks after  . Pyelonephritis 2012  . Seizures ([place]) 2003   after first delivery  . Suicidal ideations   . Urinary tract infection     Past Surgical History:  Procedure Laterality Date  . CESAREAN SECTION   12/2001 and 01/2008  . CESAREAN SECTION  06/26/2012   Procedure: CESAREAN SECTION;  Surgeon: Lavonia Drafts, MD;  Location: [facility];  Service: Obstetrics;  Laterality: N/A;  . INDUCED ABORTION  01/2010    Social History  Substance Use Topics  . Smoking status: Never Smoker  . Smokeless tobacco: Never Used  . Alcohol use No    Family History  Problem Relation Age of Onset  . Diabetes Mother     Type 2  . Anemia Sister   . Hypertension Sister   . Anesthesia problems Neg Hx     No Known Allergies  Medication list has been reviewed and updated.  Current Outpatient Prescriptions on File Prior to Visit  Medication Sig Dispense Refill  . acyclovir (ZOVIRAX) 200 MG capsule Take 2 capsules (400 mg total) by mouth 2 (two) times daily. 120 capsule 5  . albuterol (VENTOLIN HFA) 108 (90 BASE) MCG/ACT inhaler Inhale 2 puffs into the lungs every 4 (four) hours as needed for wheezing or shortness of breath. For dyspnea. 1 Inhaler 3  . ALPRAZolam (XANAX) 0.25 MG tablet Take 1 tablet (0.25 mg total) by mouth 2 (two) times daily as needed for anxiety. 20 tablet 0  . amoxicillin-clavulanate (AUGMENTIN) 500-125 MG tablet Take 1 tablet (500 mg total) by mouth 2 (two) times daily. 20 tablet 0  . butalbital-acetaminophen-caffeine (FIORICET, ESGIC) 50-325-40 MG tablet Take 1 or 2 every 8 hours as needed for headache. Max 6 in 24 hours 20 tablet 0  . Caffeine-Magnesium Salicylate (DIUREX PO) Take 1 tablet by mouth every 6 (six) hours as needed (for fluid).    . SUMAtriptan (IMITREX) 25 MG tablet Take 1 tablet at start of migraine headache. If it is not resolved, you may take a 2nd tablet 2 hours later. 20 tablet 1   No current facility-administered medications on file prior to visit.     Review of Systems: Vitals:   08/22/16 1458  BP: 110/70  Pulse: 85  Temp: 98.7 F (37.1 C)    As per HPI- otherwise negative.   Physical Examination:  GEN: WDWN, NAD, Non-toxic, A & O x 3, looks  well HEENT: Atraumatic, Normocephalic. Neck supple. No masses, No LAD. Ears and Nose: No external deformity. CV: RRR, No M/G/R. No JVD. No thrill. No extra heart sounds. PULM: CTA B, no wheezes, crackles, rhonchi. No retractions. No resp. distress. No accessory muscle use. ABD: S, NT, ND, +BS. No rebound. No HSM. EXTR: No c/c/e NEURO Normal gait. PSYCH: Normally interactive. Conversant. Not depressed or anxious appearing.  Calm demeanor.  Normal BLE strength, sensation, DTR, and negative SLR No saddle anesthesia  Assessment and Plan: Urge incontinence  Irregular menses - Plan: POCT urine pregnancy  Dysuria -  Plan: Urine culture, POCT urinalysis dipstick  Here today with recent UTI and urge incont Will make sure her UTI is cleared up Recommended urology eval- she declines for now but will think about it Discussed strategies for improving her urge incont  Signed Paula Blinks, MD  Called her on 9/20- urine culture was positive as below, different bacteria than on her last culture 08/11/16. Will dc augmentin as she has been taking it irregularly and use a course of macrobid instead.  She states understanding  Results for orders placed or performed in visit on 08/22/16  Urine culture  Result Value Ref Range   Culture ENTEROCOCCUS SPECIES    Colony Count 50,000-100,000 CFU/mL    Organism ID, Bacteria ENTEROCOCCUS SPECIES       Susceptibility   Enterococcus species -  (no method available)    AMPICILLIN <=2 Sensitive     LEVOFLOXACIN 1 Sensitive     NITROFURANTOIN <=16 Sensitive     VANCOMYCIN 1 Sensitive     TETRACYCLINE >=16 Resistant   POCT urinalysis dipstick  Result Value Ref Range   Color, UA yellow yellow   Clarity, UA clear clear   Glucose, UA negative negative   Bilirubin, UA negative negative   Ketones, POC UA negative negative   Spec Grav, UA >=1.030    Blood, UA negative negative   pH, UA 6.0    Protein Ur, POC negative negative   Urobilinogen, UA negative     Nitrite, UA Negative Negative   Leukocytes, UA Negative Negative  POCT urine pregnancy  Result Value Ref Range   Preg Test, Ur Negative Negative

## 2016-08-22 NOTE — Patient Instructions (Signed)
Your urine looks clear but we will repeat your culture to be sure the infection is cleared up I would encourage you to practice kegel exercises- aim to do at least 50 reps daily You might also try timed voiding- this means that you will urinate "by the clock," perhaps every 90 minutes or so, and gradually increase the interval.  This can help to "train your bladder" I am also glad to have you see urology, especially if your culture turns out negative

## 2016-08-23 ENCOUNTER — Emergency Department (HOSPITAL_COMMUNITY)
Admission: EM | Admit: 2016-08-23 | Discharge: 2016-08-23 | Disposition: A | Discharge status: Home / Self Care | Payer: 59 | Attending: Emergency Medicine | Admitting: Emergency Medicine

## 2016-08-23 ENCOUNTER — Telehealth: Payer: Self-pay | Admitting: Family Medicine

## 2016-08-23 ENCOUNTER — Encounter (HOSPITAL_COMMUNITY): Payer: Self-pay | Admitting: Emergency Medicine

## 2016-08-23 DIAGNOSIS — J45909 Unspecified asthma, uncomplicated: Secondary | ICD-10-CM | POA: Diagnosis not present

## 2016-08-23 DIAGNOSIS — F329 Major depressive disorder, single episode, unspecified: Secondary | ICD-10-CM | POA: Diagnosis not present

## 2016-08-23 DIAGNOSIS — Z01818 Encounter for other preprocedural examination: Secondary | ICD-10-CM | POA: Diagnosis present

## 2016-08-23 DIAGNOSIS — Z79899 Other long term (current) drug therapy: Secondary | ICD-10-CM | POA: Insufficient documentation

## 2016-08-23 DIAGNOSIS — R4589 Other symptoms and signs involving emotional state: Secondary | ICD-10-CM

## 2016-08-23 LAB — COMPREHENSIVE METABOLIC PANEL
ALT: 14 U/L (ref 14–54)
AST: 21 U/L (ref 15–41)
Albumin: 4.6 g/dL (ref 3.5–5.0)
Alkaline Phosphatase: 47 U/L (ref 38–126)
Anion gap: 10 (ref 5–15)
BILIRUBIN TOTAL: 0.8 mg/dL (ref 0.3–1.2)
BUN: 17 mg/dL (ref 6–20)
CALCIUM: 9.5 mg/dL (ref 8.9–10.3)
CHLORIDE: 106 mmol/L (ref 101–111)
CO2: 23 mmol/L (ref 22–32)
CREATININE: 0.93 mg/dL (ref 0.44–1.00)
Glucose, Bld: 90 mg/dL (ref 65–99)
Potassium: 3.6 mmol/L (ref 3.5–5.1)
Sodium: 139 mmol/L (ref 135–145)
TOTAL PROTEIN: 8.5 g/dL — AB (ref 6.5–8.1)

## 2016-08-23 LAB — CBC WITH DIFFERENTIAL/PLATELET
BASOS ABS: 0.1 10*3/uL (ref 0.0–0.1)
Basophils Relative: 1 %
EOS ABS: 0.3 10*3/uL (ref 0.0–0.7)
Eosinophils Relative: 4 %
HCT: 42.2 % (ref 36.0–46.0)
Hemoglobin: 13.5 g/dL (ref 12.0–15.0)
LYMPHS ABS: 2.5 10*3/uL (ref 0.7–4.0)
Lymphocytes Relative: 33 %
MCH: 26.7 pg (ref 26.0–34.0)
MCHC: 32 g/dL (ref 30.0–36.0)
MCV: 83.4 fL (ref 78.0–100.0)
MONO ABS: 0.3 10*3/uL (ref 0.1–1.0)
MONOS PCT: 4 %
NEUTROS ABS: 4.3 10*3/uL (ref 1.7–7.7)
Neutrophils Relative %: 58 %
Platelets: 322 10*3/uL (ref 150–400)
RBC: 5.06 MIL/uL (ref 3.87–5.11)
RDW: 24.2 % — ABNORMAL HIGH (ref 11.5–15.5)
WBC: 7.5 10*3/uL (ref 4.0–10.5)

## 2016-08-23 LAB — ACETAMINOPHEN LEVEL: Acetaminophen (Tylenol), Serum: <10 ug/mL — ABNORMAL LOW (ref 10–30)

## 2016-08-23 LAB — ETHANOL

## 2016-08-23 LAB — SALICYLATE LEVEL

## 2016-08-23 NOTE — Telephone Encounter (Signed)
Reviewed. Plan is as noted, although it was relayed to me that patient was having suicidal thoughts without active plan, and was very pitiful on the phone. This prompted ER disposition.

## 2016-08-23 NOTE — ED Notes (Signed)
PA at bedside.

## 2016-08-23 NOTE — Telephone Encounter (Signed)
Please check on patient this late afternoon/evening through chart to verify ER visit.  Also we should give her a follow-up call tomorrow.

## 2016-08-23 NOTE — ED Provider Notes (Signed)
[facility] DEPT Provider Note   CSN: [id number] Arrival date & time: 08/23/16  1509     History   Chief Complaint Chief Complaint  Patient presents with  . Medical Clearance    HPI Paula Benson is a 30 y.o. female.  HPI Paula Benson is a 30 y.o. female with PMH significant for bipolar disorder and anxiety who presents with increased feelings of hopelessness and "feeling down and depressed over the last couple of weeks.  She contacted her PCP office who urged her to come to the ED for psychiatric eval.  Patient has an appointment Thursday with a psychiatrist.  She denies any active plan or HI, but she does state she thinks about driving in her car and getting into an accident.  She states she has been admitted to a behavioral facility in the past for something similar.  She denies EtOH or drug use.  She does take xanax, but states that she doesn't feel like it helps. She denies any other complaints.   Past Medical History:  Diagnosis Date  . Anemia   . Anxiety   . Asthma   . Bipolar II disorder ([place])   . Complication of anesthesia 2003   ASPIRATED DURING EMERGENCY CS  . Depression    doing ok  . Eclamptic seizure 2003  . Fibroid   . Genital herpes   . Headache(784.0)   . Herpes   . History of blood clots    x3  . History of blood transfusion MARCH 2013  . Menometrorrhagia 07/27/2016  . Migraines   . Personal history of PE (pulmonary embolism) x 3, latest 02/2010   noncompliant with INR checks, must limit refills  . Preeclampsia 2003   with first pregnancy  . Pregnancy induced hypertension 2003   seized 2wks after  . Pyelonephritis 2012  . Seizures ([place]) 2003   after first delivery  . Suicidal ideations   . Urinary tract infection     Patient Active Problem List   Diagnosis Date Noted  . Menometrorrhagia 07/27/2016  . 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 CAP (community acquired pneumonia) 11/02/2014  . Pyelonephritis 10/31/2014  .  Hypokalemia 10/31/2014  . Neck pain 02/05/2014  . Migraine, unspecified, without mention of intractable migraine without mention of status migrainosus 08/14/2013  . Palpitations 04/12/2012  . Hx of preeclampsia, prior pregnancy, currently pregnant--had eclamptic seizure 2 weeks postpartum 02/20/2012  . History of aspiration pneumonitis 02/20/2012  . Iron deficiency anemia 02/20/2012  . Recurrent urinary tract infection 08/25/2011  . Genital herpes 06/09/2011  . Panic disorder 03/21/2011  . History of pulmonary embolus (PE) 03/17/2011  . Chronic anticoagulation 03/17/2011  . ASTHMA, INTERMITTENT 03/04/2010    Past Surgical History:  Procedure Laterality Date  . CESAREAN SECTION  12/2001 and 01/2008  . CESAREAN SECTION  06/26/2012   Procedure: CESAREAN SECTION;  Surgeon: Lavonia Drafts, MD;  Location: [facility];  Service: Obstetrics;  Laterality: N/A;  . INDUCED ABORTION  01/2010    OB History    Gravida Para Term Preterm AB Living   4 3 3  0 1 3   SAB TAB Ectopic Multiple Live Births   0 1 0 0 3       Home Medications    Prior to Admission medications   Medication Sig Start Date End Date Taking? Authorizing Provider  acyclovir (ZOVIRAX) 200 MG capsule Take 2 capsules (400 mg total) by mouth 2 (two) times daily. 03/10/16  Yes Olin Hauser, DO  ALPRAZolam (XANAX) 0.25 MG tablet Take 1 tablet (0.25 mg total) by mouth 2 (two) times daily as needed for anxiety. 07/28/16  Yes Gay Filler Copland, MD  amoxicillin-clavulanate (AUGMENTIN) 500-125 MG tablet Take 1 tablet (500 mg total) by mouth 2 (two) times daily. 08/15/16  Yes Gay Filler Copland, MD  SUMAtriptan (IMITREX) 25 MG tablet Take 1 tablet at start of migraine headache. If it is not resolved, you may take a 2nd tablet 2 hours later. 07/04/16  Yes Gay Filler Copland, MD  albuterol (VENTOLIN HFA) 108 (90 BASE) MCG/ACT inhaler Inhale 2 puffs into the lungs every 4 (four) hours as needed for wheezing or shortness of breath. For  dyspnea. 06/29/12   Truett Mainland, DO  butalbital-acetaminophen-caffeine (FIORICET, ESGIC) 906 140 2115 MG tablet Take 1 or 2 every 8 hours as needed for headache. Max 6 in 24 hours 07/11/16   Darreld Mclean, MD  Caffeine-Magnesium Salicylate (DIUREX PO) Take 1 tablet by mouth every 6 (six) hours as needed (for fluid).    Historical Provider, MD    Family History Family History  Problem Relation Age of Onset  . Diabetes Mother     Type 2  . Anemia Sister   . Hypertension Sister   . Anesthesia problems Neg Hx     Social History Social History  Substance Use Topics  . Smoking status: Never Smoker  . Smokeless tobacco: Never Used  . Alcohol use No     Allergies   Review of patient's allergies indicates no known allergies.   Review of Systems Review of Systems All other systems negative unless otherwise stated in HPI   Physical Exam Updated Vital Signs BP (!) 143/103   Pulse 76   Temp 98.4 F (36.9 C) (Oral)   Resp 16   Ht 5\' 3"  (1.6 m)   Wt 74.8 kg   LMP 08/03/2016 (Approximate)   SpO2 100%   BMI 29.23 kg/m   Physical Exam  Constitutional: She is oriented to person, place, and time. She appears well-developed and well-nourished.  HENT:  Head: Normocephalic and atraumatic.  Right Ear: External ear normal.  Left Ear: External ear normal.  Eyes: Conjunctivae are normal. No scleral icterus.  Neck: No tracheal deviation present.  Cardiovascular: Normal rate and regular rhythm.   Pulmonary/Chest: Effort normal and breath sounds normal. No respiratory distress.  Abdominal: Soft. She exhibits no distension. There is no tenderness.  Musculoskeletal: Normal range of motion.  Neurological: She is alert and oriented to person, place, and time.  Skin: Skin is warm and dry.  Psychiatric: Her speech is normal. She is withdrawn. She exhibits a depressed mood. She expresses suicidal (passive thoughts ) ideation. She expresses no homicidal ideation. She expresses no suicidal  plans and no homicidal plans.     ED Treatments / Results  Labs (all labs ordered are listed, but only abnormal results are displayed) Labs Reviewed  COMPREHENSIVE METABOLIC PANEL - Abnormal; Notable for the following:       Result Value   Total Protein 8.5 (*)    All other components within normal limits  CBC WITH DIFFERENTIAL/PLATELET - Abnormal; Notable for the following:    RDW 24.2 (*)    All other components within normal limits  ACETAMINOPHEN LEVEL - Abnormal; Notable for the following:    Acetaminophen (Tylenol), Serum <10 (*)    All other components within normal limits  ETHANOL  SALICYLATE LEVEL  URINE RAPID DRUG SCREEN, HOSP PERFORMED  POC URINE PREG, ED  EKG  EKG Interpretation None       Radiology No results found.  Procedures Procedures (including critical care time)  Medications Ordered in ED Medications - No data to display   Initial Impression / Assessment and Plan / ED Course  I have reviewed the triage vital signs and the nursing notes.  Pertinent labs & imaging results that were available during my care of the patient were reviewed by me and considered in my medical decision making (see chart for details).  Clinical Course    Patient presents with feelings of hopelessness.  Seem to be passive suicidal ideations, more so thoughts of "not being here".  No plan.  No HI.  Labs without acute abnormalities.  Will consult TTS.  6 PM: Informed by nursing that patient does not wish to stay because she needs to pick up her kids.  Discussed staying in the ED to be seen by TTS, and patient states she does not have some to pick up her kids.  She is a reasonable person.  I do not feel the patient needs IVC at this time.  Patient voiced understanding, and she states she continues to deny suicidal ideation with a plan, and it's feelings of hopelessness.  Discussed with patient that if she has increased thoughts or feels as if she may act on ideations then she  return immediately to the ED, she voiced understanding of this.  She has follow up with psychiatry in 2 days.  Final Clinical Impressions(s) / ED Diagnoses   Final diagnoses:  Depressed mood    New Prescriptions Discharge Medication List as of 08/23/2016  6:06 PM       Gloriann Loan, PA-C 08/23/16 2111    Leo Grosser, MD 08/24/16 1320

## 2016-08-23 NOTE — Telephone Encounter (Signed)
° °  Reason for call: Patient called stating that she has had suicidal thought and has had them in the past. States she needs to talk with someone now. Stated that Dr. Lorelei Pont is aware of her situation and has prescribed her Xanax but at a low dose and its not working. Stated that she didn't think coming here seeing another provider would help unless they are willing to increase her dosage of Xanax. Stated she did not want to go to the ER because she did not want to be admitted. Stated that she has an appointment with [facility] on Thursday but needs her Xanax increased to get her through until then. Stated that she right now just feels hopeless and don't know what to do.   I spoke with Caryl Pina and explained the situation and Caryl Pina spoke with her. (Ashley's note to follow)

## 2016-08-23 NOTE — Telephone Encounter (Signed)
Per chart, pt is currently in the ER.   

## 2016-08-23 NOTE — ED Notes (Signed)
Attempted blood draw with no success.     

## 2016-08-23 NOTE — ED Triage Notes (Signed)
Patient reports she was sent by her doctor's office for her "thoughts of hopelessness." Pt denies SI and HI. Pt states she feels "down and depressed." "I don't trust anyone and I'm just having a hard time." Pt calm and cooperative in triage.

## 2016-08-23 NOTE — Telephone Encounter (Signed)
Pt was offered to be transferred to [facility] to speak with a nurse.  Pt states she would prefer to talk to Dr. Lorelei Pont as she is most familiar with what's going on with her.  Dr. Lorelei Pont is currently out of the office.  Pt states she does not feel suicidal, but instead feels hopeless.  No plans or means to harm herself or anyone else.  Lately she has been experiencing high levels of anxiety and has been having to take an increased dose of Xanax.  Pt would like to be seen and possibly have someone increase her dose of Xanax.  Spoke with Einar Pheasant and he's agreeable to seeing patient, but strongly feels patient should be seen at [facility].  The same was relayed to patient.  She is currently in the office with her manager, who says she is unable to take patient to the ER.  Pt is agreeable to driving herself there and plans to go now.

## 2016-08-23 NOTE — Discharge Instructions (Signed)
Follow up with your psychiatrist in 2 days as well as your primary care physician.  Return to the ED if you experience increased depressed thoughts or feeling like you may act on your feelings.

## 2016-08-24 LAB — URINE CULTURE

## 2016-08-24 MED ORDER — NITROFURANTOIN MONOHYD MACRO 100 MG PO CAPS: 100.0000 mg | ORAL_CAPSULE | Freq: Two times a day (BID) | ORAL | 0 refills | Status: DC

## 2016-08-24 NOTE — Addendum Note (Signed)
Addended by: Lamar Blinks C on: 08/24/2016 06:18 PM   Modules accepted: Orders

## 2016-08-24 NOTE — Telephone Encounter (Addendum)
Called to follow up with patient.  Pt states she feels a little bit better.  She did go to the ER last night.  Says she waited awhile.  Had blood drawn and then asked to leave so she could go and pick up her kids.  She says she has an appt with Psych tomorrow at 2:30 pm and plans to keep it, but she wanted to make Dr. Lorelei Pont aware that she did take 6 sleeping pills last night (Zzzquil) to help her sleep.  States she has a high tolerance for medication.  Wants to know if there is anything that Dr. Lorelei Pont is willing to prescribe to help her to sleep.  Dr. Lorelei Pont states not at this time, instead she wants patient to follow up with Psych.  The same was relayed to patient.  Pt asked if there was anything she could take safely over the counter.  Dr. Lorelei Pont says she will call patient.    Message routed to Dr. Lorelei Pont.

## 2016-08-24 NOTE — Telephone Encounter (Signed)
Called pt twice but did not reach.  LMOM- please do not take anything for sleep that is not rx for her. Do not want to change meds today- wait for psychiatry appt tomorrow! Be sure to keep this appt. If there is any chance she may harm herself please seek help right away.  I hope that she is feeling better very soon

## 2016-08-24 NOTE — Telephone Encounter (Signed)
Paula Benson called back and we talked.  She denies any intent of self harm and plans to see psychiatry tomorrow. If this visit is not helpful she will contact me

## 2016-08-26 ENCOUNTER — Ambulatory Visit (INDEPENDENT_AMBULATORY_CARE_PROVIDER_SITE_OTHER): Payer: 59 | Admitting: Family Medicine

## 2016-08-26 ENCOUNTER — Telehealth: Payer: Self-pay

## 2016-08-26 ENCOUNTER — Encounter: Payer: Self-pay | Admitting: Family Medicine

## 2016-08-26 VITALS — BP 110/70 | HR 92 | Temp 98.2°F | Wt 162.0 lb

## 2016-08-26 DIAGNOSIS — F314 Bipolar disorder, current episode depressed, severe, without psychotic features: Secondary | ICD-10-CM

## 2016-08-26 MED ORDER — QUETIAPINE FUMARATE 100 MG PO TABS: ORAL_TABLET | ORAL | 0 refills | Status: DC

## 2016-08-26 NOTE — Telephone Encounter (Signed)
Pt came in for appt. 

## 2016-08-26 NOTE — Telephone Encounter (Signed)
We will see her but it sounds like she needs to go to [place] [facility] long

## 2016-08-26 NOTE — Progress Notes (Signed)
Pre visit review using our clinic review tool, if applicable. No additional management support is needed unless otherwise documented below in the visit note. 

## 2016-08-26 NOTE — Patient Instructions (Signed)
Bipolar Disorder Bipolar disorder is a mental illness. The term bipolar disorder actually is used to describe a group of disorders that all share varying degrees of emotional highs and lows that can interfere with daily functioning, such as work, school, or relationships. Bipolar disorder also can lead to drug abuse, hospitalization, and suicide. The emotional highs of bipolar disorder are periods of elation or irritability and high energy. These highs can range from a mild form (hypomania) to a severe form (mania). People experiencing episodes of hypomania may appear energetic, excitable, and highly productive. People experiencing mania may behave impulsively or erratically. They often make poor decisions. They may have difficulty sleeping. The most severe episodes of mania can involve having very distorted beliefs or perceptions about the world and seeing or hearing things that are not real (psychotic delusions and hallucinations).  The emotional lows of bipolar disorder (depression) also can range from mild to severe. Severe episodes of bipolar depression can involve psychotic delusions and hallucinations. Sometimes people with bipolar disorder experience a state of mixed mood. Symptoms of hypomania or mania and depression are both present during this mixed-mood episode. SIGNS AND SYMPTOMS There are signs and symptoms of the episodes of hypomania and mania as well as the episodes of depression. The signs and symptoms of hypomania and mania are similar but vary in severity. They include:  Inflated self-esteem or feeling of increased self-confidence.  Decreased need for sleep.  Unusual talkativeness (rapid or pressured speech) or the feeling of a need to keep talking.  Sensation of racing thoughts or constant talking, with quick shifts between topics that may or may not be related (flight of ideas).  Decreased ability to focus or concentrate.  Increased purposeful activity, such as work, studies,  or social activity, or nonproductive activity, such as pacing, squirming and fidgeting, or finger and toe tapping.  Impulsive behavior and use of poor judgment, resulting in high-risk activities, such as having unprotected sex or spending excessive amounts of money. Signs and symptoms of depression include the following:   Feelings of sadness, hopelessness, or helplessness.  Frequent or uncontrollable episodes of crying.  Lack of feeling anything or caring about anything.  Difficulty sleeping or sleeping too much.  Inability to enjoy the things you used to enjoy.   Desire to be alone all the time.   Feelings of guilt or worthlessness.  Lack of energy or motivation.   Difficulty concentrating, remembering, or making decisions.  Change in appetite or weight beyond normal fluctuations.  Thoughts of death or the desire to harm yourself. DIAGNOSIS  Bipolar disorder is diagnosed through an assessment by your caregiver. Your caregiver will ask questions about your emotional episodes. There are two main types of bipolar disorder. People with type I bipolar disorder have manic episodes with or without depressive episodes. People with type II bipolar disorder have hypomanic episodes and major depressive episodes, which are more serious than mild depression. The type of bipolar disorder you have can make an important difference in how your illness is monitored and treated. Your caregiver may ask questions about your medical history and use of alcohol or drugs, including prescription medication. Certain medical conditions and substances also can cause emotional highs and lows that resemble bipolar disorder (secondary bipolar disorder).  TREATMENT  Bipolar disorder is a long-term illness. It is best controlled with continuous treatment rather than treatment only when symptoms occur. The following treatments can be prescribed for bipolar disorders:  Medication--Medication can be prescribed by  a doctor that  is an expert in treating mental disorders (psychiatrists). Medications called mood stabilizers are usually prescribed to help control the illness. Other medications are sometimes added if symptoms of mania, depression, or psychotic delusions and hallucinations occur despite the use of a mood stabilizer. °· Talk therapy--Some forms of talk therapy are helpful in providing support, education, and guidance. °A combination of medication and talk therapy is best for managing the disorder over time. A procedure in which electricity is applied to your brain through your scalp (electroconvulsive therapy) is used in cases of severe mania when medication and talk therapy do not work or work too slowly. °  °This information is not intended to replace advice given to you by your health care provider. Make sure you discuss any questions you have with your health care provider. °  °Document Released: 02/27/2001 Document Revised: 12/12/2014 Document Reviewed: 12/17/2012 °Elsevier Interactive Patient Education ©2016 [employer] ° °

## 2016-08-26 NOTE — Telephone Encounter (Signed)
Pt called in stating she only wanted to speak to me.  Spoke to patient.  She says today she feels like a zombie and does not know what to do.  She went to her psych appt and completed the initial assessment.  States this appt made things worse as it brought back memories of her childhood.  She is also currently dating someone with a personality disorder and is currently coming to terms with that.  She has been feeling so low today that she reached out to a therapist, Bosie Clos, with employee assistance program who suggested that she reach out to her PCP to see if they can start her on some type of medication.  Therapist also suggested that she not go back to work.  She works in [employer] and says she is not in any condition to talk to customers because she cannot stop crying.  Pt not scheduled to speak to psychiatrist until 09/26/16.    She denies suicidal/homicidal ideations.  However says occasionally she has thoughts of taking meds from a bottle, but doesn't because of her kids.    Pt was advised to go to [facility] hospital, but said she does not want to go.  She's been this week and had to sit there for 3 hours, plus she has her kids and does not have anyone to keep them.   This is the second time patient has called this week.    Pt scheduled with Dr. Etter Sjogren today (08/26/16) at 4:15 pm.

## 2016-08-26 NOTE — Progress Notes (Signed)
Patient ID: Paula Benson, female    DOB: Aug 05, 1986  Age: 30 y.o. MRN: [id number]    Subjective:  Subjective  HPI Tinnie DEAISA HOSAKA presents for c/o severe depression.  ---- pt states she is not suicidal .  She has been to counselor today and she advised she see her pcp.  [facility] but waited 3 hours and left.  Pt states she was hospitalized in [place] several years ago but was never put on meds.  She states she feels like a zombie or like she is having an outer body experience.  Her mother is bipolar.  She lives with her boyfriend who has a personality disorder -- he is not being treated either  Review of Systems  Constitutional: Negative for activity change, appetite change and unexpected weight change.  Respiratory: Negative for cough and shortness of breath.   Cardiovascular: Negative for chest pain and palpitations.  Psychiatric/Behavioral: Positive for decreased concentration, dysphoric mood and sleep disturbance. Negative for behavioral problems, self-injury and suicidal ideas. The patient is not nervous/anxious.        Pt states she feels like she is a zombie or having an outer body experience     History Past Medical History:  Diagnosis Date  . Anemia   . Anxiety   . Asthma   . Bipolar II disorder ([place])   . Complication of anesthesia 2003   ASPIRATED DURING EMERGENCY CS  . Depression    doing ok  . Eclamptic seizure 2003  . Fibroid   . Genital herpes   . Headache(784.0)   . Herpes   . History of blood clots    x3  . History of blood transfusion MARCH 2013  . Menometrorrhagia 07/27/2016  . Migraines   . Personal history of PE (pulmonary embolism) x 3, latest 02/2010   noncompliant with INR checks, must limit refills  . Preeclampsia 2003   with first pregnancy  . Pregnancy induced hypertension 2003   seized 2wks after  . Pyelonephritis 2012  . Seizures ([place]) 2003   after first delivery  . Suicidal ideations   . Urinary tract infection     She has  a past surgical history that includes Cesarean section (12/2001 and 01/2008); Induced abortion (01/2010); and Cesarean section (06/26/2012).   Her family history includes Anemia in her sister; Diabetes in her mother; Hypertension in her sister.She reports that she has never smoked. She has never used smokeless tobacco. She reports that she does not drink alcohol or use drugs.  Current Outpatient Prescriptions on File Prior to Visit  Medication Sig Dispense Refill  . acyclovir (ZOVIRAX) 200 MG capsule Take 2 capsules (400 mg total) by mouth 2 (two) times daily. 120 capsule 5  . albuterol (VENTOLIN HFA) 108 (90 BASE) MCG/ACT inhaler Inhale 2 puffs into the lungs every 4 (four) hours as needed for wheezing or shortness of breath. For dyspnea. 1 Inhaler 3  . ALPRAZolam (XANAX) 0.25 MG tablet Take 1 tablet (0.25 mg total) by mouth 2 (two) times daily as needed for anxiety. 20 tablet 0  . butalbital-acetaminophen-caffeine (FIORICET, ESGIC) 50-325-40 MG tablet Take 1 or 2 every 8 hours as needed for headache. Max 6 in 24 hours 20 tablet 0  . Caffeine-Magnesium Salicylate (DIUREX PO) Take 1 tablet by mouth every 6 (six) hours as needed (for fluid).    . nitrofurantoin, macrocrystal-monohydrate, (MACROBID) 100 MG capsule Take 1 capsule (100 mg total) by mouth 2 (two) times daily. 14 capsule  0  . SUMAtriptan (IMITREX) 25 MG tablet Take 1 tablet at start of migraine headache. If it is not resolved, you may take a 2nd tablet 2 hours later. 20 tablet 1   No current facility-administered medications on file prior to visit.      Objective:  Objective  Physical Exam  Psychiatric: Her behavior is normal. Her mood appears not anxious. Her affect is not angry, not blunt, not labile and not inappropriate. She exhibits a depressed mood. She expresses no homicidal and no suicidal ideation. She expresses no suicidal plans and no homicidal plans.  Nursing note and vitals reviewed.  BP 110/70 (BP Location: Left Arm,  Patient Position: Sitting, Cuff Size: Normal)   Pulse 92   Temp 98.2 F (36.8 C) (Oral)   Wt 162 lb (73.5 kg)   LMP 08/03/2016 (Approximate)   SpO2 98%   BMI 28.70 kg/m  Wt Readings from Last 3 Encounters:  08/26/16 162 lb (73.5 kg)  08/23/16 165 lb (74.8 kg)  08/22/16 164 lb (74.4 kg)   Bipolar quest--- + bipolar--  Scanned into epic  Lab Results  Component Value Date   WBC 7.5 08/23/2016   HGB 13.5 08/23/2016   HCT 42.2 08/23/2016   PLT 322 08/23/2016   GLUCOSE 90 08/23/2016   ALT 14 08/23/2016   AST 21 08/23/2016   NA 139 08/23/2016   K 3.6 08/23/2016   CL 106 08/23/2016   CREATININE 0.93 08/23/2016   BUN 17 08/23/2016   CO2 23 08/23/2016   INR 1.91 (H) 11/05/2014    No results found.   Assessment & Plan:  Plan  I am having Ms. Lanpher start on QUEtiapine. I am also having her maintain her albuterol, Caffeine-Magnesium Salicylate (DIUREX PO), acyclovir, SUMAtriptan, butalbital-acetaminophen-caffeine, ALPRAZolam, and nitrofurantoin (macrocrystal-monohydrate).  Meds ordered this encounter  Medications  . QUEtiapine (SEROQUEL) 100 MG tablet    Sig: 50 mg po day 1, then 100 mg day 2 and 200 mg day 3 and 300mg  day 4  Then stay on 300 mg daily    Dispense:  90 tablet    Refill:  0    Problem List Items Addressed This Visit    None    Visit Diagnoses    Bipolar disorder, current episode depressed, severe, without psychotic features ([place])    -  Primary   Relevant Medications   QUEtiapine (SEROQUEL) 100 MG tablet    keep psych appointment Follow-up: Return in about 2 weeks (around 09/09/2016), or if symptoms worsen or fail to improve.   Pt here >30 min-- with > 50% discussing problem and plan Pt agreed to go to ER if symptoms worsen  Ann Held, DO

## 2016-08-29 ENCOUNTER — Ambulatory Visit (INDEPENDENT_AMBULATORY_CARE_PROVIDER_SITE_OTHER): Payer: 59 | Admitting: Family Medicine

## 2016-08-29 ENCOUNTER — Encounter: Payer: Self-pay | Admitting: Family Medicine

## 2016-08-29 VITALS — BP 114/72 | HR 100 | Temp 98.5°F | Ht 63.0 in | Wt 163.8 lb

## 2016-08-29 DIAGNOSIS — F314 Bipolar disorder, current episode depressed, severe, without psychotic features: Secondary | ICD-10-CM

## 2016-08-29 DIAGNOSIS — S01511D Laceration without foreign body of lip, subsequent encounter: Secondary | ICD-10-CM | POA: Diagnosis not present

## 2016-08-29 NOTE — Patient Instructions (Addendum)
I am glad that you are feeling better.  Please stick to a consistent schedule even though you are not going to work right now- try to exercise, eat healthy foods and get plenty of sleep.  Spend time outdoors and try to do things that are relaxing and comforting for you  Please come and see me next week to check on your progress and get your stitches out If you are not safe please call 911!  Please remove all firearms from your home at least for now
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
## 2016-08-29 NOTE — Progress Notes (Signed)
Pre visit review using our clinic review tool, if applicable. No additional management support is needed unless otherwise documented below in the visit note. 

## 2016-09-02 ENCOUNTER — Encounter: Payer: Self-pay | Admitting: Family Medicine

## 2016-09-02 ENCOUNTER — Telehealth: Payer: Self-pay | Admitting: Family Medicine

## 2016-09-02 ENCOUNTER — Ambulatory Visit: Payer: 59 | Admitting: Physician Assistant

## 2016-09-02 MED ORDER — PENICILLIN V POTASSIUM 500 MG PO TABS: 500.0000 mg | ORAL_TABLET | Freq: Three times a day (TID) | ORAL | 0 refills | Status: DC

## 2016-09-02 NOTE — Telephone Encounter (Signed)
Called pt and left message to return call to schedule same day appt. MyChart message sent as well.

## 2016-09-02 NOTE — Telephone Encounter (Signed)
Pt called in at 3:40. She stated that her PCP called in a Rx to pharmacy for the pain that she was coming in to see provider for. Pt would like to not be charged for missed appt considering that it is no longer needed.

## 2016-09-02 NOTE — Telephone Encounter (Signed)
Would recommend an examination so we can assess before starting antibiotics. Can she be seen today?

## 2016-09-02 NOTE — Telephone Encounter (Signed)
No charge -- ok to remove from schedule

## 2016-09-02 NOTE — Telephone Encounter (Signed)
I cannot prescribe antibiotics, but will forward to "doc of the day" for review in PCP absence.

## 2016-09-02 NOTE — Telephone Encounter (Signed)
Relation to [id number] Call back [phone]  Pharmacy: [place], [place] - [address] [phone] (Phone) [phone] (Fax)     Reason for call:   Patient sent PCP a my chart message requesting an antibiotic / pain medication for an upcoming dentist appointment. Advised PCP is out of the office and will forward to covering nurse. Please advise

## 2016-09-02 NOTE — Telephone Encounter (Signed)
Called her back- I had rx penicillin for possible dental infection.  She reports that she actually has an appt to be seen today and was not sure if she needs to keep the appt.  I had not realized that she was able to get an appt today- in that case I would encourage her to be seen today to confirm the dx and determine best course of treatment  Meds ordered this encounter  Medications  . penicillin v potassium (VEETID) 500 MG tablet    Sig: Take 1 tablet (500 mg total) by mouth 3 (three) times daily.    Dispense:  30 tablet    Refill:  0

## 2016-09-05 ENCOUNTER — Encounter: Payer: Self-pay | Admitting: Family Medicine

## 2016-09-07 ENCOUNTER — Telehealth: Payer: Self-pay | Admitting: Family Medicine

## 2016-09-07 ENCOUNTER — Ambulatory Visit: Payer: 59 | Admitting: Family Medicine

## 2016-09-07 ENCOUNTER — Other Ambulatory Visit: Payer: 59

## 2016-09-07 ENCOUNTER — Ambulatory Visit: Payer: 59 | Admitting: Hematology & Oncology

## 2016-09-07 ENCOUNTER — Encounter: Payer: Self-pay | Admitting: Family Medicine

## 2016-09-07 NOTE — Telephone Encounter (Signed)
FYI:  Patient called stating that she will not be able to make appointment today because of some personal issues she's having. Her 32 year old daughter attempted suicide last night. Patient rescheduled for tomorrow.

## 2016-09-08 ENCOUNTER — Other Ambulatory Visit: Payer: 59

## 2016-09-08 ENCOUNTER — Ambulatory Visit: Payer: 59 | Admitting: Family Medicine

## 2016-09-08 ENCOUNTER — Ambulatory Visit: Payer: 59 | Admitting: Hematology & Oncology

## 2016-09-14 ENCOUNTER — Ambulatory Visit: Payer: 59 | Admitting: Family Medicine

## 2016-09-14 ENCOUNTER — Encounter: Payer: Self-pay | Admitting: Family Medicine

## 2016-09-14 ENCOUNTER — Ambulatory Visit (INDEPENDENT_AMBULATORY_CARE_PROVIDER_SITE_OTHER): Payer: 59 | Admitting: Family Medicine

## 2016-09-14 VITALS — BP 117/81 | HR 81 | Temp 98.3°F | Ht 63.0 in | Wt 169.4 lb

## 2016-09-14 DIAGNOSIS — F314 Bipolar disorder, current episode depressed, severe, without psychotic features: Secondary | ICD-10-CM

## 2016-09-14 NOTE — Patient Instructions (Signed)
It was good to see you today!  I am so glad that your daughter is ok.   For the time being continue the seroquel, and start exercising- ideally by walking outdoors or doing some sort of group/ social activity- most days of the week Please plan to discuss your symptoms with your psychiatrist at your upcoming appointment- your medications will likely need to be adjusted to provide the best results for you

## 2016-09-14 NOTE — Progress Notes (Signed)
[place] at [facility] [address], [place], [place] [address] [phone]  Date:  09/14/2016   Name:  Paula Benson   DOB:  [birth year]   MRN:  [id number]  PCP:  Lamar Blinks, MD    Chief Complaint: Follow-up (Pt states that she has not noticed a difference with the medication. Pt feels that the meds may be causing weight gain. )   History of Present Illness:  Paula Benson is a 30 y.o. very pleasant female patient who presents with the following:  She started on seroquel about 3 weeks ago for suspect bipolar disorder.  She is here today to go over some FMLA paperwork and also to check on her mental heatlh  Her daughter is 64 yo, she attempted suicide thorough overdose about a week ago, she is still admitted to a hospital in [place].  She is doing ok so far, was dx with bipolar disorder.   This is stressful for Paula Benson.  fortunate her daughter was not seriously harmed in this overdose attempt  She states that she does not need the FMLA paperwork that we had planned on completing after all as she plans to try and find a new job.  She does also currently have STD for her mental health condition She is taking the seroquel at bedtime- she is up to 300 mg She is also seeing a therapist weekly.  Saw her today  Her psychiatry appt is on 10/23  She is no longer using xanax.  She denies any intention towards self- harm.  She denies having any plan for suicide  She is sleeping well at night.   Otherwise she states that the seroquel has not seemed to make any difference in how she is feeling.  However I do note that she seems calmer today. She has also noted some weight gain. She is not exercising   She is not sure of the exact date but states her menses were less than a month ago  Wt Readings from Last 3 Encounters:  09/14/16 169 lb 6.4 oz (76.8 kg)  08/29/16 163 lb 12.8 oz (74.3 kg)  08/26/16 162 lb (73.5 kg)     Patient  Active Problem List   Diagnosis Date Noted  . Menometrorrhagia 07/27/2016  . 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 CAP (community acquired pneumonia) 11/02/2014  . Pyelonephritis 10/31/2014  . Hypokalemia 10/31/2014  . Neck pain 02/05/2014  . Migraine, unspecified, without mention of intractable migraine without mention of status migrainosus 08/14/2013  . Palpitations 04/12/2012  . Hx of preeclampsia, prior pregnancy, currently pregnant--had eclamptic seizure 2 weeks postpartum 02/20/2012  . History of aspiration pneumonitis 02/20/2012  . Iron deficiency anemia 02/20/2012  . Recurrent urinary tract infection 08/25/2011  . Genital herpes 06/09/2011  . Panic disorder 03/21/2011  . History of pulmonary embolus (PE) 03/17/2011  . Chronic anticoagulation 03/17/2011  . ASTHMA, INTERMITTENT 03/04/2010    Past Medical History:  Diagnosis Date  . Anemia   . Anxiety   . Asthma   . Bipolar II disorder ([place])   . Complication of anesthesia 2003   ASPIRATED DURING EMERGENCY CS  . Depression    doing ok  . Eclamptic seizure 2003  . Fibroid   . Genital herpes   . Headache(784.0)   . Herpes   . History of blood clots    x3  . History of blood transfusion MARCH 2013  . Menometrorrhagia 07/27/2016  .  Migraines   . Personal history of PE (pulmonary embolism) x 3, latest 02/2010   noncompliant with INR checks, must limit refills  . Preeclampsia 2003   with first pregnancy  . Pregnancy induced hypertension 2003   seized 2wks after  . Pyelonephritis 2012  . Seizures ([place]) 2003   after first delivery  . Suicidal ideations   . Urinary tract infection     Past Surgical History:  Procedure Laterality Date  . CESAREAN SECTION  12/2001 and 01/2008  . CESAREAN SECTION  06/26/2012   Procedure: CESAREAN SECTION;  Surgeon: Lavonia Drafts, MD;  Location: [facility];  Service: Obstetrics;  Laterality: N/A;  . INDUCED ABORTION  01/2010    Social History  Substance Use Topics  .  Smoking status: Never Smoker  . Smokeless tobacco: Never Used  . Alcohol use No    Family History  Problem Relation Age of Onset  . Diabetes Mother     Type 2  . Anemia Sister   . Hypertension Sister   . Anesthesia problems Neg Hx     No Known Allergies  Medication list has been reviewed and updated.  Current Outpatient Prescriptions on File Prior to Visit  Medication Sig Dispense Refill  . acyclovir (ZOVIRAX) 200 MG capsule Take 2 capsules (400 mg total) by mouth 2 (two) times daily. 120 capsule 5  . albuterol (VENTOLIN HFA) 108 (90 BASE) MCG/ACT inhaler Inhale 2 puffs into the lungs every 4 (four) hours as needed for wheezing or shortness of breath. For dyspnea. 1 Inhaler 3  . ALPRAZolam (XANAX) 0.25 MG tablet Take 1 tablet (0.25 mg total) by mouth 2 (two) times daily as needed for anxiety. 20 tablet 0  . butalbital-acetaminophen-caffeine (FIORICET, ESGIC) 50-325-40 MG tablet Take 1 or 2 every 8 hours as needed for headache. Max 6 in 24 hours 20 tablet 0  . Caffeine-Magnesium Salicylate (DIUREX PO) Take 1 tablet by mouth every 6 (six) hours as needed (for fluid).    . nitrofurantoin, macrocrystal-monohydrate, (MACROBID) 100 MG capsule Take 1 capsule (100 mg total) by mouth 2 (two) times daily. 14 capsule 0  . penicillin v potassium (VEETID) 500 MG tablet Take 1 tablet (500 mg total) by mouth 3 (three) times daily. 30 tablet 0  . QUEtiapine (SEROQUEL) 100 MG tablet 50 mg po day 1, then 100 mg day 2 and 200 mg day 3 and 300mg  day 4  Then stay on 300 mg daily 90 tablet 0  . SUMAtriptan (IMITREX) 25 MG tablet Take 1 tablet at start of migraine headache. If it is not resolved, you may take a 2nd tablet 2 hours later. 20 tablet 1   No current facility-administered medications on file prior to visit.     Review of Systems:  As per HPI- otherwise negative.  No fever, chills, nausea, vomiting, rash   Physical Examination: Vitals:   09/14/16 1430  BP: 117/81  Pulse: 81  Temp: 98.3  F (36.8 C)   Vitals:   09/14/16 1430  Weight: 169 lb 6.4 oz (76.8 kg)  Height: 5\' 3"  (1.6 m)   Body mass index is 30.01 kg/m. Ideal Body Weight: Weight in (lb) to have BMI = 25: 140.8  GEN: WDWN, NAD, Non-toxic, A & O x 3, looks well, overweight HEENT: Atraumatic, Normocephalic. Neck supple. No masses, No LAD. Ears and Nose: No external deformity. CV: RRR, No M/G/R. No JVD. No thrill. No extra heart sounds. PULM: CTA B, no wheezes, crackles, rhonchi. No retractions. No resp.  distress. No accessory muscle use. ABD: S, NT, ND, +BS. No rebound. No HSM. EXTR: No c/c/e NEURO Normal gait.  PSYCH: Normally interactive. Conversant. Not depressed or anxious appearing.  Calm demeanor.    Assessment and Plan: Bipolar disorder, current episode depressed, severe, without psychotic features ([place])  Stable today.  Overall she does seem better- calmer- to me today.  She is still feeling down and depressed but has no intent of self harm.  Encouraged her to start exercise to help with her mood and to minimize weight gain.  She is seeing psychiatry soon and I expect they may adjust her medications.  For now continue seroquel  Signed Lamar Blinks, MD

## 2016-09-14 NOTE — Progress Notes (Signed)
Pre visit review using our clinic review tool, if applicable. No additional management support is needed unless otherwise documented below in the visit note. 

## 2016-10-03 ENCOUNTER — Other Ambulatory Visit: Payer: Self-pay | Admitting: Family Medicine

## 2016-10-03 ENCOUNTER — Encounter: Payer: Self-pay | Admitting: Family Medicine

## 2016-10-03 DIAGNOSIS — F314 Bipolar disorder, current episode depressed, severe, without psychotic features: Secondary | ICD-10-CM

## 2016-10-04 ENCOUNTER — Encounter: Payer: Self-pay | Admitting: Family Medicine

## 2016-10-04 DIAGNOSIS — F314 Bipolar disorder, current episode depressed, severe, without psychotic features: Secondary | ICD-10-CM

## 2016-10-04 MED ORDER — QUETIAPINE FUMARATE 100 MG PO TABS: ORAL_TABLET | ORAL | 0 refills | Status: DC

## 2016-10-04 NOTE — Telephone Encounter (Signed)
I have refilled Rx for Seroquel. Pt was last seen 09/14/16 and was instructed to keep taking.  I refilled #90 tablets with no refills. TL/CMA

## 2016-10-06 NOTE — Telephone Encounter (Signed)
Called pt-  I got a form "[facility] mental health provider statement."  Offered to fax this to her psychiatrist- but it turns out she rescheduled this appt again and has not been seen. Advised that we would need a face to face visit to complete this detailed form.  She is not sure if she will need this form or not- for the time being I will hang onto it for her

## 2016-11-06 ENCOUNTER — Encounter: Payer: Self-pay | Admitting: Family Medicine

## 2016-11-08 ENCOUNTER — Other Ambulatory Visit: Payer: Self-pay | Admitting: Family Medicine

## 2016-11-08 DIAGNOSIS — F314 Bipolar disorder, current episode depressed, severe, without psychotic features: Secondary | ICD-10-CM

## 2016-11-08 MED ORDER — QUETIAPINE FUMARATE 100 MG PO TABS: ORAL_TABLET | ORAL | 3 refills | Status: DC

## 2016-11-08 NOTE — Telephone Encounter (Signed)
Refill request for Seroquel 100 mg [3 daily] Last filled by MD on - 10/04/16, #90x0 Last AEX - 09/14/16 Next AEX - Was to F/U with Psychiatry Please Advise on refills/SLS 12/05

## 2016-11-09 NOTE — Telephone Encounter (Signed)
Rx filled by Dr. Lorelei Pont on 11/08/2016.

## 2016-12-15 ENCOUNTER — Other Ambulatory Visit: Payer: Self-pay | Admitting: Family Medicine

## 2016-12-15 DIAGNOSIS — F314 Bipolar disorder, current episode depressed, severe, without psychotic features: Secondary | ICD-10-CM

## 2016-12-16 ENCOUNTER — Encounter: Payer: Self-pay | Admitting: Family Medicine

## 2016-12-16 ENCOUNTER — Other Ambulatory Visit: Payer: Self-pay | Admitting: Emergency Medicine

## 2016-12-17 ENCOUNTER — Other Ambulatory Visit: Payer: Self-pay | Admitting: Family Medicine

## 2016-12-17 DIAGNOSIS — F314 Bipolar disorder, current episode depressed, severe, without psychotic features: Secondary | ICD-10-CM

## 2016-12-27 ENCOUNTER — Encounter: Payer: Self-pay | Admitting: Family Medicine

## 2016-12-27 MED ORDER — ALBUTEROL SULFATE HFA 108 (90 BASE) MCG/ACT IN AERS: 2.0000 | INHALATION_SPRAY | Freq: Four times a day (QID) | RESPIRATORY_TRACT | 3 refills | Status: DC | PRN

## 2017-01-05 ENCOUNTER — Encounter: Payer: Self-pay | Admitting: Family Medicine

## 2017-01-05 ENCOUNTER — Ambulatory Visit: Payer: Self-pay | Admitting: Family Medicine

## 2017-03-02 ENCOUNTER — Encounter: Payer: Self-pay | Admitting: Family Medicine

## 2017-03-14 ENCOUNTER — Encounter: Payer: Self-pay | Admitting: Family Medicine

## 2017-03-14 DIAGNOSIS — A6000 Herpesviral infection of urogenital system, unspecified: Secondary | ICD-10-CM

## 2017-03-14 MED ORDER — ACYCLOVIR 200 MG PO CAPS: 400.0000 mg | ORAL_CAPSULE | Freq: Two times a day (BID) | ORAL | 0 refills | Status: DC

## 2017-03-16 ENCOUNTER — Ambulatory Visit (INDEPENDENT_AMBULATORY_CARE_PROVIDER_SITE_OTHER): Payer: Self-pay | Admitting: Family Medicine

## 2017-03-16 ENCOUNTER — Other Ambulatory Visit (HOSPITAL_COMMUNITY)
Admission: RE | Admit: 2017-03-16 | Discharge: 2017-03-16 | Disposition: A | Discharge status: Home / Self Care | Payer: 59 | Source: Ambulatory Visit | Attending: Family Medicine | Admitting: Family Medicine

## 2017-03-16 VITALS — BP 127/87 | HR 100 | Temp 98.9°F | Ht 63.0 in | Wt 171.8 lb

## 2017-03-16 DIAGNOSIS — N760 Acute vaginitis: Secondary | ICD-10-CM | POA: Insufficient documentation

## 2017-03-16 DIAGNOSIS — Z124 Encounter for screening for malignant neoplasm of cervix: Secondary | ICD-10-CM

## 2017-03-16 DIAGNOSIS — B9689 Other specified bacterial agents as the cause of diseases classified elsewhere: Secondary | ICD-10-CM | POA: Diagnosis not present

## 2017-03-16 DIAGNOSIS — N898 Other specified noninflammatory disorders of vagina: Secondary | ICD-10-CM | POA: Diagnosis present

## 2017-03-16 DIAGNOSIS — F39 Unspecified mood [affective] disorder: Secondary | ICD-10-CM | POA: Diagnosis not present

## 2017-03-16 NOTE — Patient Instructions (Signed)
It was nice to see you today!  I will be in touch with your pap asap Please ask your new mental health care provider about your seroquel medication-  It may be that a different medication would be better for you and may not cause weight gain for you

## 2017-03-16 NOTE — Progress Notes (Signed)
Pre visit review using our clinic review tool, if applicable. No additional management support is needed unless otherwise documented below in the visit note. 

## 2017-03-16 NOTE — Progress Notes (Addendum)
[place] at [facility] [address], [place],  [address] [phone]  Date:  03/16/2017   Name:  Paula Benson   DOB:  13-Oct-1986   MRN:  [id number]  PCP:  Lamar Blinks, MD    Chief Complaint: Medication concern (Pt woud like to discuss using a different medication for sleep. Currently taking 1 tablet daily of Seroquel)   History of Present Illness:  Paula Benson is a 31 y.o. very pleasant female patient who presents with the following:  Last seen by myself 6 months ago, 09/2016.    At that time she had a psychiatry appt scheduled for later in the month.   She states that she did establish care wiht psychiatry for a period of time, but cannot remember who she was seeing but is not seeing them any longer.  However she apparently has a new psychiatrist who she is seeing in 2 days  She is taking 100 mg of seroquel right now.  Notes that this really helps her to sleep which is a positive.  However, she has gained weight on this medication which is troublesome to her She feels like her mood is stable, she denies any SI or significant depression/ anxiety at this time Would like me to change her medication- however explained that I will defer this to her psychiatrist who she is seeing in 2 days .  She did have an abnl pap at some point years ago.   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  In any case she is concerned and would like to update her pap today which is fine LMP 3/29 She has noted some thin discharge off and on but thinks it is probably just normal    Patient Active Problem List   Diagnosis Date Noted  . Menometrorrhagia 07/27/2016  . 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 CAP (community acquired pneumonia) 11/02/2014  . Pyelonephritis 10/31/2014  . Hypokalemia 10/31/2014  . Neck pain 02/05/2014  . Migraine, unspecified, without mention of intractable  migraine without mention of status migrainosus 08/14/2013  . Palpitations 04/12/2012  . Hx of preeclampsia, prior pregnancy, currently pregnant--had eclamptic seizure 2 weeks postpartum 02/20/2012  . History of aspiration pneumonitis 02/20/2012  . Iron deficiency anemia 02/20/2012  . Recurrent urinary tract infection 08/25/2011  . Genital herpes 06/09/2011  . Panic disorder 03/21/2011  . History of pulmonary embolus (PE) 03/17/2011  . Chronic anticoagulation 03/17/2011  . ASTHMA, INTERMITTENT 03/04/2010    Past Medical History:  Diagnosis Date  . Anemia   . Anxiety   . Asthma   . Bipolar II disorder ([place])   . Complication of anesthesia 2003   ASPIRATED DURING EMERGENCY CS  . Depression    doing ok  . Eclamptic seizure 2003  . Fibroid   . Genital herpes   . Headache(784.0)   . Herpes   . History of blood clots    x3  . History of blood transfusion MARCH 2013  . Menometrorrhagia 07/27/2016  . Migraines   . Personal history of PE (pulmonary embolism) x 3, latest 02/2010   noncompliant with INR checks, must limit refills  . Preeclampsia 2003   with first pregnancy  . Pregnancy induced hypertension 2003   seized 2wks after  . Pyelonephritis 2012  . Seizures ([place]) 2003   after first delivery  . Suicidal ideations   . Urinary tract  infection     Past Surgical History:  Procedure Laterality Date  . CESAREAN SECTION  12/2001 and 01/2008  . CESAREAN SECTION  06/26/2012   Procedure: CESAREAN SECTION;  Surgeon: Lavonia Drafts, MD;  Location: [facility];  Service: Obstetrics;  Laterality: N/A;  . INDUCED ABORTION  01/2010    Social History  Substance Use Topics  . Smoking status: Never Smoker  . Smokeless tobacco: Never Used  . Alcohol use No    Family History  Problem Relation Age of Onset  . Diabetes Mother     Type 2  . Anemia Sister   . Hypertension Sister   . Anesthesia problems Neg Hx     No Known Allergies  Medication list has been reviewed and  updated.  Current Outpatient Prescriptions on File Prior to Visit  Medication Sig Dispense Refill  . acyclovir (ZOVIRAX) 200 MG capsule Take 2 capsules (400 mg total) by mouth 2 (two) times daily. 120 capsule 0  . albuterol (PROVENTIL HFA;VENTOLIN HFA) 108 (90 Base) MCG/ACT inhaler Inhale 2 puffs into the lungs every 6 (six) hours as needed for wheezing or shortness of breath. 1 Inhaler 3  . ALPRAZolam (XANAX) 0.25 MG tablet Take 1 tablet (0.25 mg total) by mouth 2 (two) times daily as needed for anxiety. 20 tablet 0  . butalbital-acetaminophen-caffeine (FIORICET, ESGIC) 50-325-40 MG tablet Take 1 or 2 every 8 hours as needed for headache. Max 6 in 24 hours 20 tablet 0  . Caffeine-Magnesium Salicylate (DIUREX PO) Take 1 tablet by mouth every 6 (six) hours as needed (for fluid).    . nitrofurantoin, macrocrystal-monohydrate, (MACROBID) 100 MG capsule Take 1 capsule (100 mg total) by mouth 2 (two) times daily. 14 capsule 0  . penicillin v potassium (VEETID) 500 MG tablet Take 1 tablet (500 mg total) by mouth 3 (three) times daily. 30 tablet 0  . QUEtiapine (SEROQUEL) 100 MG tablet 300 mg daily 90 tablet 3  . SUMAtriptan (IMITREX) 25 MG tablet Take 1 tablet at start of migraine headache. If it is not resolved, you may take a 2nd tablet 2 hours later. 20 tablet 1   No current facility-administered medications on file prior to visit.     Review of Systems:  As per HPI- otherwise negative.   Physical Examination: Vitals:   03/16/17 1801  BP: 127/87  Pulse: 100  Temp: 98.9 F (37.2 C)   Vitals:   03/16/17 1801  Weight: 171 lb 12.8 oz (77.9 kg)  Height: 5\' 3"  (1.6 m)   Body mass index is 30.43 kg/m. Ideal Body Weight: Weight in (lb) to have BMI = 25: 140.8  GEN: WDWN, NAD, Non-toxic, A & O x 3, has gained weight HEENT: Atraumatic, Normocephalic. Neck supple. No masses, No LAD. Ears and Nose: No external deformity. CV: RRR, No M/G/R. No JVD. No thrill. No extra heart sounds. PULM:  CTA B, no wheezes, crackles, rhonchi. No retractions. No resp. distress. No accessory muscle use. ABD: S, NT, ND, +BS. No rebound. No HSM. EXTR: No c/c/e NEURO Normal gait.  PSYCH: Normally interactive. Conversant. Not depressed or anxious appearing.  Calm demeanor. Pelvic: normal, no vaginal lesions or discharge. Uterus normal, no CMT, no adnexal tendereness or masses   Assessment and Plan: Screening for cervical cancer - Plan: Cytology - PAP  Vaginal discharge - Plan: Cervicovaginal ancillary only  Mood disorder [facility])  Here today for a follow-up visit Will defer any medication changes to her psychiatrist.  Eliott Nine her to express her concerns  about weight gain, but did remind her that restful and good sleep is key to health in bipolar disorder and this must be a priority  Will be in touch with her pap- and wet prep asap  Signed Lamar Blinks, MD Received her pap/ [address] [facility]  Results for orders placed or performed in visit on 03/16/17  Cytology - PAP  Result Value Ref Range   Adequacy      Satisfactory for evaluation  endocervical/transformation zone component ABSENT.   Diagnosis      NEGATIVE FOR INTRAEPITHELIAL LESIONS OR MALIGNANCY.   HPV NOT DETECTED    Chlamydia Negative    Neisseria gonorrhea Negative    Material Submitted CervicoVaginal Pap [ThinPrep Imaged]    CYTOLOGY - PAP PAP RESULT   Cervicovaginal ancillary only  Result Value Ref Range   Wet Prep (BD Affirm) **POSITIVE for [place]** (A)

## 2017-03-20 LAB — CERVICOVAGINAL ANCILLARY ONLY: Wet Prep (BD Affirm): POSITIVE — AB

## 2017-03-21 ENCOUNTER — Encounter: Payer: Self-pay | Admitting: Family Medicine

## 2017-03-21 LAB — CYTOLOGY - PAP
Adequacy: ABSENT
Chlamydia: NEGATIVE
Diagnosis: NEGATIVE
HPV (WINDOPATH): NOT DETECTED
Neisseria Gonorrhea: NEGATIVE

## 2017-03-27 MED ORDER — METRONIDAZOLE 500 MG PO TABS
500.0000 mg | ORAL_TABLET | Freq: Two times a day (BID) | ORAL | 0 refills | Status: DC
Start: 2017-03-27 — End: 2017-08-23

## 2017-03-27 MED ORDER — FLUCONAZOLE 150 MG PO TABS: 150.0000 mg | ORAL_TABLET | Freq: Once | ORAL | 0 refills | Status: AC

## 2017-03-27 NOTE — Addendum Note (Signed)
Addended by: Lamar Blinks C on: 03/27/2017 04:26 PM   Modules accepted: Orders

## 2017-05-16 ENCOUNTER — Other Ambulatory Visit: Payer: Self-pay | Admitting: Family Medicine

## 2017-05-16 ENCOUNTER — Telehealth: Payer: Self-pay | Admitting: Family Medicine

## 2017-05-16 DIAGNOSIS — A6000 Herpesviral infection of urogenital system, unspecified: Secondary | ICD-10-CM

## 2017-05-16 MED ORDER — ACYCLOVIR 200 MG PO CAPS: 400.0000 mg | ORAL_CAPSULE | Freq: Two times a day (BID) | ORAL | 11 refills | Status: DC

## 2017-05-16 NOTE — Telephone Encounter (Signed)
Pt is requesting refill on acyclovir 200mg  capsules.   Last Fill: 03/14/2017 #120 and 0RF Pt sig: 2 caps po bid   Okay to refill?

## 2017-08-23 ENCOUNTER — Encounter: Payer: Self-pay | Admitting: Family Medicine

## 2017-08-23 MED ORDER — METRONIDAZOLE 500 MG PO TABS: 500.0000 mg | ORAL_TABLET | Freq: Two times a day (BID) | ORAL | 0 refills | Status: DC

## 2017-08-23 MED ORDER — FLUCONAZOLE 150 MG PO TABS: 150.0000 mg | ORAL_TABLET | Freq: Once | ORAL | 0 refills | Status: AC

## 2017-08-24 NOTE — Telephone Encounter (Signed)
Request RX to be sent to [facility] on [address], call patient once sent [phone]

## 2017-08-24 NOTE — Telephone Encounter (Signed)
Patient calling back and now requesting to be sent to [facility] on [address] [facility], patient needs today. Please advise

## 2017-10-05 ENCOUNTER — Encounter: Payer: Self-pay | Admitting: Family Medicine

## 2017-10-05 DIAGNOSIS — F314 Bipolar disorder, current episode depressed, severe, without psychotic features: Secondary | ICD-10-CM

## 2017-10-06 MED ORDER — QUETIAPINE FUMARATE 100 MG PO TABS: ORAL_TABLET | ORAL | 1 refills | Status: DC

## 2017-10-06 NOTE — Addendum Note (Signed)
Addended by: Lamar Blinks C on: 10/06/2017 12:33 PM   Modules accepted: Orders

## 2017-11-03 ENCOUNTER — Ambulatory Visit: Payer: Self-pay | Admitting: *Deleted

## 2017-11-03 ENCOUNTER — Telehealth: Payer: Self-pay

## 2017-11-03 NOTE — Telephone Encounter (Signed)
Please advise 

## 2017-11-03 NOTE — Telephone Encounter (Signed)
Patient called in to report a low Iron level from an emergency room visit back in September.  She reports an Iron level of 4.  She states she is fatigued all the time and reported she had to have iron transfusions last year.  She is requesting the physician to order a transfusion for her. She feels she cannot continue to be so tired all the time. Please advise her through [place] today if possible.

## 2017-11-03 NOTE — Telephone Encounter (Signed)
Copied from [place] [id number]. Topic: Inquiry >> Nov 03, 2017 10:28 AM Bea Graff, NT wrote: Reason for CRM: Back in September this patient went to urgent care and they drew some labs and her iron levels low but they did not contact her until yesterday because pt reached out to them for her lab results. Her Ferrotrin levels on the sheet are 4. She wants to see if she needs to just go and get a blood transfusion or what Dr. Lorelei Pont would advise. She is trying to avoid going to the emergency room if possible. She has lost her job and no insurance. Please advise.

## 2017-11-03 NOTE — Telephone Encounter (Signed)
Ok- please give her a call back.  The low ferritin tells [place] that her iron is low, but it would be more helpful to know her hemoglobin.  Her hemoglobin (or hematocrit) is the number that tells [place] if a transfusion is needed.  I do not have this information -she could also call the clinic that did this blood work for her and speak to someone there for advice.   I am glad to see her for follow-up if she needs.  However a low ferrin on it's own does not indicate need for a blood transfusion

## 2017-11-04 ENCOUNTER — Other Ambulatory Visit: Payer: Self-pay

## 2017-11-04 ENCOUNTER — Encounter (HOSPITAL_COMMUNITY): Payer: Self-pay | Admitting: Emergency Medicine

## 2017-11-04 ENCOUNTER — Emergency Department (HOSPITAL_COMMUNITY)
Admission: EM | Admit: 2017-11-04 | Discharge: 2017-11-05 | Disposition: A | Discharge status: Home / Self Care | Payer: 59 | Attending: Emergency Medicine | Admitting: Emergency Medicine

## 2017-11-04 DIAGNOSIS — J45909 Unspecified asthma, uncomplicated: Secondary | ICD-10-CM | POA: Insufficient documentation

## 2017-11-04 DIAGNOSIS — Z79899 Other long term (current) drug therapy: Secondary | ICD-10-CM | POA: Insufficient documentation

## 2017-11-04 DIAGNOSIS — R531 Weakness: Secondary | ICD-10-CM

## 2017-11-04 NOTE — ED Triage Notes (Signed)
Pt reports that she has been feeling weak for the last several days and has had a hx of blood transfusions. Pt reports having shortness of breath exertion.

## 2017-11-04 NOTE — ED Provider Notes (Signed)
[facility] DEPT Provider Note: Georgena Spurling, MD, FACEP  CSN: [id number] MRN: [id number] ARRIVAL: 11/04/17 at 2320 ROOM: WA20/WA20   CHIEF COMPLAINT  Weakness and Shortness of Breath   HISTORY OF PRESENT ILLNESS  11/04/17 11:48 PM Paula Benson is a 31 y.o. female with a history of chronic iron deficiency anemia as well as thromboembolic disease.  She is not currently on anticoagulation.  She has previously been on iron infusions and has required blood transfusions in the past.  She is here with several days of increased weakness which is generalized as well as dyspnea on exertion.  She becomes dyspneic with minimal exertion such as walking across the room.  The dyspnea is associated with tachycardia.  She has also had chills.  She has had an intermittent, sharp, pleuritic, right upper chest pain.  This pain is not severe.  She denies fever, nausea, vomiting or diarrhea.  She denies active bleeding.   Past Medical History:  Diagnosis Date  . Anemia   . Anxiety   . Asthma   . Bipolar II disorder ([place])   . Complication of anesthesia 2003   ASPIRATED DURING EMERGENCY CS  . Depression    doing ok  . Eclamptic seizure 2003  . Fibroid   . Genital herpes   . Headache(784.0)   . Herpes   . History of blood clots    x3  . History of blood transfusion MARCH 2013  . Menometrorrhagia 07/27/2016  . Migraines   . Personal history of PE (pulmonary embolism) x 3, latest 02/2010   noncompliant with INR checks, must limit refills  . Preeclampsia 2003   with first pregnancy  . Pregnancy induced hypertension 2003   seized 2wks after  . Pyelonephritis 2012  . Seizures ([place]) 2003   after first delivery  . Suicidal ideations   . Urinary tract infection     Past Surgical History:  Procedure Laterality Date  . CESAREAN SECTION  12/2001 and 01/2008  . CESAREAN SECTION  06/26/2012   Procedure: CESAREAN SECTION;  Surgeon: Lavonia Drafts, MD;  Location: [facility];  Service:  Obstetrics;  Laterality: N/A;  . INDUCED ABORTION  01/2010    Family History  Problem Relation Age of Onset  . Diabetes Mother        Type 2  . Anemia Sister   . Hypertension Sister   . Anesthesia problems Neg Hx     Social History   Tobacco Use  . Smoking status: Never Smoker  . Smokeless tobacco: Never Used  Substance Use Topics  . Alcohol use: No  . Drug use: No    Prior to Admission medications   Medication Sig Start Date End Date Taking? Authorizing Provider  acyclovir (ZOVIRAX) 200 MG capsule Take 2 capsules (400 mg total) by mouth 2 (two) times daily. 05/16/17  Yes Copland, Gay Filler, MD  albuterol (PROVENTIL HFA;VENTOLIN HFA) 108 (90 Base) MCG/ACT inhaler Inhale 2 puffs into the lungs every 6 (six) hours as needed for wheezing or shortness of breath. 12/27/16  Yes Copland, Gay Filler, MD  ALPRAZolam (XANAX) 0.25 MG tablet Take 1 tablet (0.25 mg total) by mouth 2 (two) times daily as needed for anxiety. 07/28/16  Yes Copland, Gay Filler, MD  QUEtiapine (SEROQUEL) 100 MG tablet 300 mg daily Patient taking differently: Take 100 mg by mouth at bedtime.  10/06/17  Yes Copland, Gay Filler, MD  SUMAtriptan (IMITREX) 25 MG tablet Take 1 tablet at start of migraine headache. If it is  not resolved, you may take a 2nd tablet 2 hours later. 07/04/16  Yes Copland, Gay Filler, MD  butalbital-acetaminophen-caffeine (FIORICET, ESGIC) 50-325-40 MG tablet Take 1 or 2 every 8 hours as needed for headache. Max 6 in 24 hours Patient not taking: Reported on 11/05/2017 07/11/16   Copland, Gay Filler, MD  metroNIDAZOLE (FLAGYL) 500 MG tablet Take 1 tablet (500 mg total) by mouth 2 (two) times daily. No alcohol with this med! Patient not taking: Reported on 11/05/2017 08/23/17   Copland, Gay Filler, MD    Allergies Patient has no known allergies.   REVIEW OF SYSTEMS  Negative except as noted here or in the History of Present Illness.   PHYSICAL EXAMINATION  Initial Vital Signs Blood pressure (!) 126/93,  pulse 90, temperature 98.2 F (36.8 C), temperature source Oral, resp. rate 18, height 5\' 3"  (1.6 m), weight 75.3 kg (166 lb), last menstrual period 10/15/2017, SpO2 100 %.  Examination General: Well-developed, well-nourished female in no acute distress; appearance consistent with age of record HENT: normocephalic; atraumatic Eyes: pupils equal, round and reactive to light; extraocular muscles intact; no conjunctival pallor Neck: supple Heart: regular rate and rhythm Lungs: Decreased air movement bilaterally Abdomen: soft; nondistended; nontender; bowel sounds present Extremities: No deformity; full range of motion; pulses normal Neurologic: Awake, alert and oriented; motor function intact in all extremities and symmetric; no facial droop Skin: Warm and dry Psychiatric: Normal mood and affect   RESULTS  Summary of this visit's results, reviewed by myself:   EKG Interpretation  Date/Time:  Saturday November 04 2017 23:47:59 EST Ventricular Rate:  82 PR Interval:    QRS Duration: 75 QT Interval:  359 QTC Calculation: 420 R Axis:   67 Text Interpretation:  Sinus rhythm Normal ECG Rate is slower Confirmed by Arman Loy [phone]) on 11/04/2017 11:57:47 PM      Laboratory Studies: Results for orders placed or performed during the hospital encounter of 11/04/17 (from the past 24 hour(s))  Basic metabolic panel     Status: Abnormal   Collection Time: 11/05/17 12:17 AM  Result Value Ref Range   Sodium 136 135 - 145 mmol/L   Potassium 4.0 3.5 - 5.1 mmol/L   Chloride 106 101 - 111 mmol/L   CO2 23 22 - 32 mmol/L   Glucose, Bld 80 65 - 99 mg/dL   BUN 17 6 - 20 mg/dL   Creatinine, Ser 1.02 (H) 0.44 - 1.00 mg/dL   Calcium 9.2 8.9 - 10.3 mg/dL   GFR calc non Af Amer >60 >60 mL/min   GFR calc Af Amer >60 >60 mL/min   Anion gap 7 5 - 15  Urinalysis, Routine w reflex microscopic     Status: Abnormal   Collection Time: 11/05/17 12:17 AM  Result Value Ref Range   Color, Urine YELLOW  YELLOW   APPearance CLEAR CLEAR   Specific Gravity, Urine 1.024 1.005 - 1.030   pH 5.0 5.0 - 8.0   Glucose, UA NEGATIVE NEGATIVE mg/dL   Hgb urine dipstick NEGATIVE NEGATIVE   Bilirubin Urine NEGATIVE NEGATIVE   Ketones, ur NEGATIVE NEGATIVE mg/dL   Protein, ur NEGATIVE NEGATIVE mg/dL   Nitrite NEGATIVE NEGATIVE   Leukocytes, UA SMALL (A) NEGATIVE   RBC / HPF 0-5 0 - 5 RBC/hpf   WBC, UA 6-30 0 - 5 WBC/hpf   Bacteria, UA NONE SEEN NONE SEEN   Squamous Epithelial / LPF 0-5 (A) NONE SEEN   Mucus PRESENT   CBC with Differential/Platelet  Status: Abnormal   Collection Time: 11/05/17 12:17 AM  Result Value Ref Range   WBC 8.1 4.0 - 10.5 K/uL   RBC 4.65 3.87 - 5.11 MIL/uL   Hemoglobin 11.4 (L) 12.0 - 15.0 g/dL   HCT 36.2 36.0 - 46.0 %   MCV 77.8 (L) 78.0 - 100.0 fL   MCH 24.5 (L) 26.0 - 34.0 pg   MCHC 31.5 30.0 - 36.0 g/dL   RDW 17.2 (H) 11.5 - 15.5 %   Platelets 444 (H) 150 - 400 K/uL   Neutrophils Relative % 46 %   Neutro Abs 3.7 1.7 - 7.7 K/uL   Lymphocytes Relative 41 %   Lymphs Abs 3.3 0.7 - 4.0 K/uL   Monocytes Relative 8 %   Monocytes Absolute 0.6 0.1 - 1.0 K/uL   Eosinophils Relative 4 %   Eosinophils Absolute 0.3 0.0 - 0.7 K/uL   Basophils Relative 1 %   Basophils Absolute 0.1 0.0 - 0.1 K/uL  D-dimer, quantitative (not at [facility])     Status: None   Collection Time: 11/05/17 12:17 AM  Result Value Ref Range   D-Dimer, Quant 0.33 0.00 - 0.50 ug/mL-FEU  CBG monitoring, ED     Status: None   Collection Time: 11/05/17 12:22 AM  Result Value Ref Range   Glucose-Capillary 79 65 - 99 mg/dL   Comment 1 Notify RN    Imaging Studies: No results found.  ED COURSE  Nursing notes and initial vitals signs, including pulse oximetry, reviewed.  Vitals:   11/05/17 0230 11/05/17 0300 11/05/17 0330 11/05/17 0337  BP: 116/84 119/81 105/77   Pulse: 81 84 74   Resp: 18 (!) 22 13   Temp:      TempSrc:      SpO2: 98% 99% 99% 99%  Weight:      Height:       4:10 AM No  change in subjective dyspnea after 2 neb treatments.  Breath sounds continue to be diminished but without wheezing.  Patient's d-dimer is within normal limits.  She was advised of her reassuring laboratory values.  A ferritin level is pending and she was advised she may get the results through [place].  PROCEDURES    ED DIAGNOSES     ICD-10-CM   1. Generalized weakness R53.1        Trinaty Bundrick, Jenny Reichmann, MD 11/05/17 [phone]

## 2017-11-05 LAB — BASIC METABOLIC PANEL
ANION GAP: 7 (ref 5–15)
BUN: 17 mg/dL (ref 6–20)
CHLORIDE: 106 mmol/L (ref 101–111)
CO2: 23 mmol/L (ref 22–32)
Calcium: 9.2 mg/dL (ref 8.9–10.3)
Creatinine, Ser: 1.02 mg/dL — ABNORMAL HIGH (ref 0.44–1.00)
GFR calc Af Amer: >60 mL/min (ref >60)
GFR calc non Af Amer: >60 mL/min (ref >60)
GLUCOSE: 80 mg/dL (ref 65–99)
Potassium: 4 mmol/L (ref 3.5–5.1)
Sodium: 136 mmol/L (ref 135–145)

## 2017-11-05 LAB — CBC WITH DIFFERENTIAL/PLATELET
BASOS ABS: 0.1 10*3/uL (ref 0.0–0.1)
Basophils Relative: 1 %
EOS ABS: 0.3 10*3/uL (ref 0.0–0.7)
EOS PCT: 4 %
HCT: 36.2 % (ref 36.0–46.0)
Hemoglobin: 11.4 g/dL — ABNORMAL LOW (ref 12.0–15.0)
LYMPHS ABS: 3.3 10*3/uL (ref 0.7–4.0)
Lymphocytes Relative: 41 %
MCH: 24.5 pg — AB (ref 26.0–34.0)
MCHC: 31.5 g/dL (ref 30.0–36.0)
MCV: 77.8 fL — AB (ref 78.0–100.0)
MONO ABS: 0.6 10*3/uL (ref 0.1–1.0)
Monocytes Relative: 8 %
Neutro Abs: 3.7 10*3/uL (ref 1.7–7.7)
Neutrophils Relative %: 46 %
PLATELETS: 444 10*3/uL — AB (ref 150–400)
RBC: 4.65 MIL/uL (ref 3.87–5.11)
RDW: 17.2 % — AB (ref 11.5–15.5)
WBC: 8.1 10*3/uL (ref 4.0–10.5)

## 2017-11-05 LAB — URINALYSIS, ROUTINE W REFLEX MICROSCOPIC
BILIRUBIN URINE: NEGATIVE
Bacteria, UA: NONE SEEN
Glucose, UA: NEGATIVE mg/dL
Hgb urine dipstick: NEGATIVE
KETONES UR: NEGATIVE mg/dL
Nitrite: NEGATIVE
PROTEIN: NEGATIVE mg/dL
Specific Gravity, Urine: 1.024 (ref 1.005–1.030)
pH: 5 (ref 5.0–8.0)

## 2017-11-05 LAB — CBG MONITORING, ED: Glucose-Capillary: 79 mg/dL (ref 65–99)

## 2017-11-05 LAB — D-DIMER, QUANTITATIVE (NOT AT ARMC): D DIMER QUANT: 0.33 ug{FEU}/mL (ref 0.00–0.50)

## 2017-11-05 LAB — FERRITIN: FERRITIN: 6 ng/mL — AB (ref 11–307)

## 2017-11-05 MED ORDER — IPRATROPIUM-ALBUTEROL 0.5-2.5 (3) MG/3ML IN SOLN
3.0000 mL | RESPIRATORY_TRACT | Status: DC
  Administered 2017-11-05 (×2): 3 mL via RESPIRATORY_TRACT
  Filled 2017-11-05 (×2): qty 3

## 2017-11-05 NOTE — ED Notes (Signed)
Requested urine from patient. 

## 2017-11-05 NOTE — ED Notes (Signed)
Patient complaining of being weak. She states she has a hx of being anemic and has received transfusions. Patient states that she is having shortness of breath. Patient O2 Sat is 97-100.

## 2017-11-06 LAB — I-STAT BETA HCG BLOOD, ED (MC, WL, AP ONLY): I-stat hCG, quantitative: <5 m[IU]/mL (ref <5)

## 2017-11-06 NOTE — Telephone Encounter (Signed)
Patient states she went to ED regarding the Results and was told her )2 was low. States she was given breathing treatments for that issue but the ferritin was not addressed. States she will call back and schedule follow up visit because she is having a problem with energy.

## 2017-11-07 ENCOUNTER — Encounter: Payer: Self-pay | Admitting: Family Medicine

## 2018-03-13 NOTE — Progress Notes (Deleted)
[place] at [facility] [address], [place], [place] [address] [phone]  Date:  03/15/2018   Name:  PATRISHA Benson   DOB:  1986-08-06   MRN:  [id number]  PCP:  Darreld Mclean, MD    Chief Complaint: No chief complaint on file.   History of Present Illness:  Paula Benson is a 32 y.o. very pleasant female patient who presents with the following:  Here today with concern of a UTI  History of PE, no longer on anticoagulation, and anxiety  Patient Active Problem List   Diagnosis Date Noted  . Menometrorrhagia 07/27/2016  . 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[place]) 12/31/2015  . CAP (community acquired pneumonia) 11/02/2014  . Pyelonephritis 10/31/2014  . Hypokalemia 10/31/2014  . Neck pain 02/05/2014  . Migraine, unspecified, without mention of intractable migraine without mention of status migrainosus 08/14/2013  . Palpitations 04/12/2012  . Hx of preeclampsia, prior pregnancy, currently pregnant--had eclamptic seizure 2 weeks postpartum 02/20/2012  . History of aspiration pneumonitis 02/20/2012  . Iron deficiency anemia 02/20/2012  . Recurrent urinary tract infection 08/25/2011  . Genital herpes 06/09/2011  . Panic disorder 03/21/2011  . History of pulmonary embolus (PE) 03/17/2011  . Chronic anticoagulation 03/17/2011  . ASTHMA, INTERMITTENT 03/04/2010    Past Medical History:  Diagnosis Date  . Anemia   . Anxiety   . Asthma   . Bipolar II disorder ([place])   . Complication of anesthesia 2003   ASPIRATED DURING EMERGENCY CS  . Depression    doing ok  . Eclamptic seizure 2003  . Fibroid   . Genital herpes   . Headache(784.0)   . Herpes   . History of blood clots    x3  . History of blood transfusion MARCH 2013  . Menometrorrhagia 07/27/2016  . Migraines   . Personal history of PE (pulmonary embolism) x 3, latest 02/2010   noncompliant with INR checks, must limit refills  . Preeclampsia 2003   with  first pregnancy  . Pregnancy induced hypertension 2003   seized 2wks after  . Pyelonephritis 2012  . Seizures ([place]) 2003   after first delivery  . Suicidal ideations   . Urinary tract infection     Past Surgical History:  Procedure Laterality Date  . CESAREAN SECTION  12/2001 and 01/2008  . CESAREAN SECTION  06/26/2012   Procedure: CESAREAN SECTION;  Surgeon: Lavonia Drafts, MD;  Location: [facility];  Service: Obstetrics;  Laterality: N/A;  . INDUCED ABORTION  01/2010    Social History   Tobacco Use  . Smoking status: Never Smoker  . Smokeless tobacco: Never Used  Substance Use Topics  . Alcohol use: No  . Drug use: No    Family History  Problem Relation Age of Onset  . Diabetes Mother        Type 2  . Anemia Sister   . Hypertension Sister   . Anesthesia problems Neg Hx     No Known Allergies  Medication list has been reviewed and updated.  Current Outpatient Medications on File Prior to Visit  Medication Sig Dispense Refill  . acyclovir (ZOVIRAX) 200 MG capsule Take 2 capsules (400 mg total) by mouth 2 (two) times daily. 120 capsule 11  . albuterol (PROVENTIL HFA;VENTOLIN HFA) 108 (90 Base) MCG/ACT inhaler Inhale 2 puffs into the lungs every 6 (six) hours as needed for wheezing or shortness of breath. 1 Inhaler 3  . ALPRAZolam (XANAX) 0.25  MG tablet Take 1 tablet (0.25 mg total) by mouth 2 (two) times daily as needed for anxiety. 20 tablet 0  . QUEtiapine (SEROQUEL) 100 MG tablet 300 mg daily (Patient taking differently: Take 100 mg by mouth at bedtime. ) 90 tablet 1  . SUMAtriptan (IMITREX) 25 MG tablet Take 1 tablet at start of migraine headache. If it is not resolved, you may take a 2nd tablet 2 hours later. 20 tablet 1   No current facility-administered medications on file prior to visit.     Review of Systems:  As per HPI- otherwise negative.   Physical Examination: There were no vitals filed for this visit. There were no vitals filed for this  visit. There is no height or weight on file to calculate BMI. Ideal Body Weight:    GEN: WDWN, NAD, Non-toxic, A & O x 3 HEENT: Atraumatic, Normocephalic. Neck supple. No masses, No LAD. Ears and Nose: No external deformity. CV: RRR, No M/G/R. No JVD. No thrill. No extra heart sounds. PULM: CTA B, no wheezes, crackles, rhonchi. No retractions. No resp. distress. No accessory muscle use. ABD: S, NT, ND, +BS. No rebound. No HSM. EXTR: No c/c/e NEURO Normal gait.  PSYCH: Normally interactive. Conversant. Not depressed or anxious appearing.  Calm demeanor.    Assessment and Plan: ***  Signed Lamar Blinks, MD

## 2018-03-15 ENCOUNTER — Ambulatory Visit: Payer: BLUE CROSS/BLUE SHIELD | Admitting: Family Medicine

## 2018-03-15 DIAGNOSIS — Z0289 Encounter for other administrative examinations: Secondary | ICD-10-CM

## 2018-03-20 ENCOUNTER — Encounter: Payer: Self-pay | Admitting: Family Medicine

## 2018-07-24 ENCOUNTER — Other Ambulatory Visit: Payer: Self-pay | Admitting: Family Medicine

## 2018-07-24 DIAGNOSIS — A6000 Herpesviral infection of urogenital system, unspecified: Secondary | ICD-10-CM

## 2018-10-10 ENCOUNTER — Other Ambulatory Visit: Payer: Self-pay | Admitting: Family Medicine

## 2018-10-10 DIAGNOSIS — A6000 Herpesviral infection of urogenital system, unspecified: Secondary | ICD-10-CM

## 2018-10-12 ENCOUNTER — Other Ambulatory Visit: Payer: Self-pay | Admitting: Family Medicine

## 2018-10-12 ENCOUNTER — Encounter: Payer: Self-pay | Admitting: Family Medicine

## 2018-10-12 DIAGNOSIS — A6000 Herpesviral infection of urogenital system, unspecified: Secondary | ICD-10-CM

## 2018-12-08 ENCOUNTER — Emergency Department (HOSPITAL_COMMUNITY)
Admission: EM | Admit: 2018-12-08 | Discharge: 2018-12-09 | Disposition: A | Discharge status: Home / Self Care | Payer: BLUE CROSS/BLUE SHIELD | Attending: Emergency Medicine | Admitting: Emergency Medicine

## 2018-12-08 ENCOUNTER — Other Ambulatory Visit: Payer: Self-pay

## 2018-12-08 ENCOUNTER — Emergency Department (HOSPITAL_COMMUNITY): Payer: BLUE CROSS/BLUE SHIELD

## 2018-12-08 DIAGNOSIS — R0789 Other chest pain: Secondary | ICD-10-CM

## 2018-12-08 DIAGNOSIS — Z79899 Other long term (current) drug therapy: Secondary | ICD-10-CM | POA: Insufficient documentation

## 2018-12-08 DIAGNOSIS — J45909 Unspecified asthma, uncomplicated: Secondary | ICD-10-CM | POA: Insufficient documentation

## 2018-12-08 DIAGNOSIS — J101 Influenza due to other identified influenza virus with other respiratory manifestations: Secondary | ICD-10-CM | POA: Diagnosis not present

## 2018-12-08 DIAGNOSIS — J45901 Unspecified asthma with (acute) exacerbation: Secondary | ICD-10-CM

## 2018-12-08 LAB — COMPREHENSIVE METABOLIC PANEL
ALBUMIN: 3.8 g/dL (ref 3.5–5.0)
ALT: 18 U/L (ref 0–44)
AST: 30 U/L (ref 15–41)
Alkaline Phosphatase: 43 U/L (ref 38–126)
Anion gap: 10 (ref 5–15)
BUN: 13 mg/dL (ref 6–20)
CO2: 23 mmol/L (ref 22–32)
Calcium: 8.5 mg/dL — ABNORMAL LOW (ref 8.9–10.3)
Chloride: 102 mmol/L (ref 98–111)
Creatinine, Ser: 0.91 mg/dL (ref 0.44–1.00)
GFR calc Af Amer: >60 mL/min (ref >60)
GFR calc non Af Amer: >60 mL/min (ref >60)
GLUCOSE: 85 mg/dL (ref 70–99)
POTASSIUM: 3.6 mmol/L (ref 3.5–5.1)
Sodium: 135 mmol/L (ref 135–145)
TOTAL PROTEIN: 7.7 g/dL (ref 6.5–8.1)
Total Bilirubin: 0.4 mg/dL (ref 0.3–1.2)

## 2018-12-08 LAB — CBC WITH DIFFERENTIAL/PLATELET
Abs Immature Granulocytes: 0.01 10*3/uL (ref 0.00–0.07)
Basophils Absolute: 0 10*3/uL (ref 0.0–0.1)
Basophils Relative: 1 %
EOS ABS: 0 10*3/uL (ref 0.0–0.5)
Eosinophils Relative: 0 %
HEMATOCRIT: 33.4 % — AB (ref 36.0–46.0)
Hemoglobin: 9.5 g/dL — ABNORMAL LOW (ref 12.0–15.0)
Immature Granulocytes: 0 %
LYMPHS ABS: 0.9 10*3/uL (ref 0.7–4.0)
Lymphocytes Relative: 23 %
MCH: 20.8 pg — ABNORMAL LOW (ref 26.0–34.0)
MCHC: 28.4 g/dL — ABNORMAL LOW (ref 30.0–36.0)
MCV: 73.1 fL — ABNORMAL LOW (ref 80.0–100.0)
MONO ABS: 0.4 10*3/uL (ref 0.1–1.0)
MONOS PCT: 12 %
NEUTROS ABS: 2.4 10*3/uL (ref 1.7–7.7)
Neutrophils Relative %: 64 %
Platelets: 404 10*3/uL — ABNORMAL HIGH (ref 150–400)
RBC: 4.57 MIL/uL (ref 3.87–5.11)
RDW: 20.8 % — AB (ref 11.5–15.5)
WBC: 3.7 10*3/uL — AB (ref 4.0–10.5)
nRBC: 0 % (ref 0.0–0.2)

## 2018-12-08 LAB — I-STAT TROPONIN, ED: TROPONIN I, POC: 0 ng/mL (ref 0.00–0.08)

## 2018-12-08 LAB — I-STAT BETA HCG BLOOD, ED (MC, WL, AP ONLY): I-stat hCG, quantitative: <5 m[IU]/mL (ref <5)

## 2018-12-08 LAB — INFLUENZA PANEL BY PCR (TYPE A & B)
INFLAPCR: NEGATIVE
INFLBPCR: POSITIVE — AB

## 2018-12-08 MED ORDER — ALBUTEROL SULFATE (2.5 MG/3ML) 0.083% IN NEBU
5.0000 mg | INHALATION_SOLUTION | Freq: Once | RESPIRATORY_TRACT | Status: AC
  Administered 2018-12-09: 5 mg via RESPIRATORY_TRACT
  Filled 2018-12-08: qty 6

## 2018-12-08 MED ORDER — ALBUTEROL SULFATE (2.5 MG/3ML) 0.083% IN NEBU
5.0000 mg | INHALATION_SOLUTION | Freq: Once | RESPIRATORY_TRACT | Status: AC
  Administered 2018-12-08: 5 mg via RESPIRATORY_TRACT
  Filled 2018-12-08: qty 6

## 2018-12-08 MED ORDER — IPRATROPIUM BROMIDE 0.02 % IN SOLN
0.5000 mg | Freq: Once | RESPIRATORY_TRACT | Status: AC
  Administered 2018-12-08: 0.5 mg via RESPIRATORY_TRACT
  Filled 2018-12-08: qty 2.5

## 2018-12-08 MED ORDER — ACETAMINOPHEN 500 MG PO TABS
1000.0000 mg | ORAL_TABLET | Freq: Once | ORAL | Status: AC
  Administered 2018-12-09: 1000 mg via ORAL
  Filled 2018-12-08: qty 2

## 2018-12-08 MED ORDER — KETOROLAC TROMETHAMINE 30 MG/ML IJ SOLN
15.0000 mg | Freq: Once | INTRAMUSCULAR | Status: AC
  Administered 2018-12-08: 15 mg via INTRAVENOUS
  Filled 2018-12-08: qty 1

## 2018-12-08 MED ORDER — SODIUM CHLORIDE 0.9 % IV BOLUS
1000.0000 mL | Freq: Once | INTRAVENOUS | Status: AC
  Administered 2018-12-08: 1000 mL via INTRAVENOUS

## 2018-12-08 MED ORDER — ONDANSETRON HCL 4 MG/2ML IJ SOLN
4.0000 mg | Freq: Once | INTRAMUSCULAR | Status: AC
  Administered 2018-12-08: 4 mg via INTRAVENOUS
  Filled 2018-12-08: qty 2

## 2018-12-08 NOTE — ED Provider Notes (Signed)
[facility] DEPT Provider Note   CSN: [id number] Arrival date & time: 12/08/18  1720     History   Chief Complaint Chief Complaint  Patient presents with  . Flu Like Symptoms    HPI Paula Benson is a 33 y.o. female with a PMHx of asthma, anemia, bipolar disorder, headaches/migraines, remote PEs not on anticoagulation, and other conditions listed below, who presents to the ED with complaints of flulike symptoms that began 3 days ago.  Symptoms include cough with yellow sputum production, fever of "101 point something", chills, body aches, headaches, sneezing, rhinorrhea, sore throat, central chest pain, nausea, 2 episodes of nonbloody nonbilious emesis today, and generally not feeling well.  She has tried TheraFlu, Alka-Seltzer, and Tylenol without relief.  No known aggravating factors.  She has not had any sick contacts that she is aware of.  She is a non-smoker.  She did not receive her flu shot this year.  Of note, she has a history of PEs, she is not on any blood thinners, but she states that this does not feel like a PE.  She denies any ear pain or drainage, drooling, trismus, shortness of breath, leg swelling, recent travel/surgery/immobilization, estrogen use, abdominal pain, hematemesis, diarrhea, constipation, dysuria, hematuria, numbness, tingling, focal weakness, rashes, or any other complaints at this time.  The history is provided by the patient and medical records. No language interpreter was used.    Past Medical History:  Diagnosis Date  . Anemia   . Anxiety   . Asthma   . Bipolar II disorder ([place])   . Complication of anesthesia 2003   ASPIRATED DURING EMERGENCY CS  . Depression    doing ok  . Eclamptic seizure 2003  . Fibroid   . Genital herpes   . Headache(784.0)   . Herpes   . History of blood clots    x3  . History of blood transfusion MARCH 2013  . Menometrorrhagia 07/27/2016  . Migraines   . Personal history of PE  (pulmonary embolism) x 3, latest 02/2010   noncompliant with INR checks, must limit refills  . Preeclampsia 2003   with first pregnancy  . Pregnancy induced hypertension 2003   seized 2wks after  . Pyelonephritis 2012  . Seizures ([place]) 2003   after first delivery  . Suicidal ideations   . Urinary tract infection     Patient Active Problem List   Diagnosis Date Noted  . Menometrorrhagia 07/27/2016  . 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 CAP (community acquired pneumonia) 11/02/2014  . Pyelonephritis 10/31/2014  . Hypokalemia 10/31/2014  . Neck pain 02/05/2014  . Migraine, unspecified, without mention of intractable migraine without mention of status migrainosus 08/14/2013  . Palpitations 04/12/2012  . Hx of preeclampsia, prior pregnancy, currently pregnant--had eclamptic seizure 2 weeks postpartum 02/20/2012  . History of aspiration pneumonitis 02/20/2012  . Iron deficiency anemia 02/20/2012  . Recurrent urinary tract infection 08/25/2011  . Genital herpes 06/09/2011  . Panic disorder 03/21/2011  . History of pulmonary embolus (PE) 03/17/2011  . Chronic anticoagulation 03/17/2011  . ASTHMA, INTERMITTENT 03/04/2010    Past Surgical History:  Procedure Laterality Date  . CESAREAN SECTION  12/2001 and 01/2008  . CESAREAN SECTION  06/26/2012   Procedure: CESAREAN SECTION;  Surgeon: Lavonia Drafts, MD;  Location: [facility];  Service: Obstetrics;  Laterality: N/A;  . INDUCED ABORTION  01/2010     OB History    Gravida  4   Para  3  Term  3   Preterm  0   AB  1   Living  3     SAB  0   TAB  1   Ectopic  0   Multiple  0   Live Births  3            Home Medications    Prior to Admission medications   Medication Sig Start Date End Date Taking? Authorizing Provider  acyclovir (ZOVIRAX) 200 MG capsule TAKE 2 CAPSULES BY MOUTH TWICE DAILY 10/12/18   Copland, Gay Filler, MD  albuterol (PROVENTIL HFA;VENTOLIN HFA) 108 (90 Base) MCG/ACT inhaler  Inhale 2 puffs into the lungs every 6 (six) hours as needed for wheezing or shortness of breath. 12/27/16   Copland, Gay Filler, MD  ALPRAZolam (XANAX) 0.25 MG tablet Take 1 tablet (0.25 mg total) by mouth 2 (two) times daily as needed for anxiety. 07/28/16   Copland, Gay Filler, MD  QUEtiapine (SEROQUEL) 100 MG tablet 300 mg daily Patient taking differently: Take 100 mg by mouth at bedtime.  10/06/17   Copland, Gay Filler, MD  SUMAtriptan (IMITREX) 25 MG tablet Take 1 tablet at start of migraine headache. If it is not resolved, you may take a 2nd tablet 2 hours later. 07/04/16   Copland, Gay Filler, MD    Family History Family History  Problem Relation Age of Onset  . Diabetes Mother        Type 2  . Anemia Sister   . Hypertension Sister   . Anesthesia problems Neg Hx     Social History Social History   Tobacco Use  . Smoking status: Never Smoker  . Smokeless tobacco: Never Used  Substance Use Topics  . Alcohol use: No  . Drug use: No     Allergies   Patient has no known allergies.   Review of Systems Review of Systems  Constitutional: Positive for chills and fever (101).  HENT: Positive for rhinorrhea, sneezing and sore throat. Negative for drooling, ear discharge, ear pain and trouble swallowing.   Respiratory: Positive for cough. Negative for shortness of breath.   Cardiovascular: Positive for chest pain. Negative for leg swelling.  Gastrointestinal: Positive for nausea and vomiting. Negative for abdominal pain, constipation and diarrhea.  Genitourinary: Negative for dysuria and hematuria.  Musculoskeletal: Positive for myalgias (body aches). Negative for arthralgias.  Skin: Negative for color change.  Allergic/Immunologic: Negative for immunocompromised state.  Neurological: Positive for headaches. Negative for weakness and numbness.  Psychiatric/Behavioral: Negative for confusion.   All other systems reviewed and are negative for acute change except as noted in the HPI.      Physical Exam Updated Vital Signs BP (!) 140/95 (BP Location: Left Arm)   Pulse (!) 107   Temp 99.5 F (37.5 C) (Oral) Comment: pt took tylenol around 17:00  Resp 16   Ht 5\' 3"  (1.6 m)   Wt 74.8 kg   SpO2 98%   BMI 29.23 kg/m   Physical Exam Vitals signs and nursing note reviewed.  Constitutional:      General: She is not in acute distress.    Appearance: Normal appearance. She is well-developed. She is not toxic-appearing.     Comments: Low grade temp 99.5, nontoxic, NAD  HENT:     Head: Normocephalic and atraumatic.     Nose: Rhinorrhea present.     Mouth/Throat:     Mouth: Mucous membranes are moist.     Pharynx: Oropharynx is clear. Uvula midline. No pharyngeal  swelling, oropharyngeal exudate, posterior oropharyngeal erythema or uvula swelling.     Tonsils: No tonsillar exudate or tonsillar abscesses.     Comments: Nose with mild rhinorrhea. Oropharynx clear and moist, without uvular swelling or deviation, no trismus or drooling, no tonsillar swelling or erythema, no exudates.   Eyes:     General:        Right eye: No discharge.        Left eye: No discharge.     Conjunctiva/sclera: Conjunctivae normal.  Neck:     Musculoskeletal: Normal range of motion and neck supple.  Cardiovascular:     Rate and Rhythm: Normal rate and regular rhythm.     Pulses: Normal pulses.     Heart sounds: Normal heart sounds, S1 normal and S2 normal. No murmur. No friction rub. No gallop.      Comments: HR 99 during evaluation, went to low 100s if pt moved around in bed but mostly stayed in upper 90s. Reg rhythm, nl s1/s2, no m/r/g, distal pulses intact, no pedal edema  Pulmonary:     Effort: Pulmonary effort is normal. No respiratory distress.     Breath sounds: Examination of the right-lower field reveals wheezing. Examination of the left-lower field reveals wheezing. Wheezing present. No decreased breath sounds, rhonchi or rales.     Comments: Faint wheezing in lower fields bilaterally,  CTAB in all other lung fields, no rhonchi/rales, no hypoxia or increased WOB, speaking in full sentences, SpO2 98% on RA  Chest:     Chest wall: No deformity, tenderness or crepitus.     Comments: Chest wall nonTTP without crepitus, deformities, or retractions  Abdominal:     General: Bowel sounds are normal. There is no distension.     Palpations: Abdomen is soft. Abdomen is not rigid.     Tenderness: There is no abdominal tenderness. There is no right CVA tenderness, left CVA tenderness, guarding or rebound. Negative signs include Murphy's sign and McBurney's sign.  Musculoskeletal: Normal range of motion.     Comments: MAE x4 Strength and sensation grossly intact in all extremities Distal pulses intact Gait steady No pedal edema, neg homan's bilaterally  Hurts anywhere she's touched on her extremities  Skin:    General: Skin is warm and dry.     Findings: No rash.  Neurological:     Mental Status: She is alert and oriented to person, place, and time.     Sensory: Sensation is intact. No sensory deficit.     Motor: Motor function is intact.  Psychiatric:        Mood and Affect: Mood and affect normal.        Behavior: Behavior normal.      ED Treatments / Results  Labs (all labs ordered are listed, but only abnormal results are displayed) Labs Reviewed  INFLUENZA PANEL BY PCR (TYPE A & B)  CBC WITH DIFFERENTIAL/PLATELET  COMPREHENSIVE METABOLIC PANEL  I-STAT TROPONIN, ED  I-STAT BETA HCG BLOOD, ED ([facility], [facility], [facility] ONLY)    EKG EKG Interpretation  Date/Time:  Saturday December 08 2018 22:02:11 EST Ventricular Rate:  100 PR Interval:    QRS Duration: 73 QT Interval:  337 QTC Calculation: 435 R Axis:   58 Text Interpretation:  Sinus tachycardia Borderline T wave abnormalities Confirmed by Lacretia Leigh ([id number]) on 12/08/2018 10:32:51 PM   Radiology Dg Chest 2 View  Result Date: 12/08/2018 CLINICAL DATA:  Productive cough and chest pain. Body aches and chills. EXAM: CHEST  -  2 VIEW COMPARISON:  Radiographs and CT 01/02/2016 FINDINGS: The cardiomediastinal contours are normal. The lungs are clear. Pulmonary vasculature is normal. No consolidation, pleural effusion, or pneumothorax. No acute osseous abnormalities are seen. IMPRESSION: No acute chest findings. Electronically Signed   By: Keith Rake M.D.   On: 12/08/2018 22:24    Procedures Procedures (including critical care time)  Medications Ordered in ED Medications  sodium chloride 0.9 % bolus 1,000 mL (has no administration in time range)  ondansetron (ZOFRAN) injection 4 mg (has no administration in time range)  ketorolac (TORADOL) 30 MG/ML injection 15 mg (has no administration in time range)  albuterol (PROVENTIL) (2.5 MG/3ML) 0.083% nebulizer solution 5 mg (5 mg Nebulization Given 12/08/18 2234)  ipratropium (ATROVENT) nebulizer solution 0.5 mg (0.5 mg Nebulization Given 12/08/18 2234)     Initial Impression / Assessment and Plan / ED Course  I have reviewed the triage vital signs and the nursing notes.  Pertinent labs & imaging results that were available during my care of the patient were reviewed by me and considered in my medical decision making (see chart for details).     33 y.o. female here with flulike symptoms that began 3 days ago.  On exam, throat clear, mild rhinorrhea, heart rate 99 during evaluation, no hypoxia, faint wheezing in lower fields bilaterally, no rhonchi or rails, no pedal edema, no abdominal tenderness, temp 99.5 (but took tylenol earlier).  Symptoms most consistent with flu, discussed that at this point Tamiflu would not be effective therefore testing for the flu would not change her management however patient would like to know, so will check flu swab.  We will get labs, chest x-ray, EKG, give nebs, Toradol, Zofran, and fluids, and reassess shortly. Doubt PE, doubt need for d-dimer.   10:52 PM CXR negative. EKG nonischemic. Remainder of work up pending. Patient care to be  resumed by Joline Maxcy, PA-C at shift change sign-out. Patient history has been discussed with provider resuming care. Please see their notes for further documentation of pending results and dispo/care. Pt stable at sign-out and updated on transfer of care.    Final Clinical Impressions(s) / ED Diagnoses   Final diagnoses:  Flu-like symptoms  Atypical chest pain    ED Discharge Orders    [address], [place], [place] 12/08/18 2252    Lacretia Leigh, MD 12/09/18 2321

## 2018-12-08 NOTE — ED Provider Notes (Signed)
33 year old female received at sign out from [facility] pending labs. Per her HPI:   "Paula Benson is a 33 y.o. female with a PMHx of asthma, anemia, bipolar disorder, headaches/migraines, remote PEs not on anticoagulation, and other conditions listed below, who presents to the ED with complaints of flulike symptoms that began 3 days ago.  Symptoms include cough with yellow sputum production, fever of "101 point something", chills, body aches, headaches, sneezing, rhinorrhea, sore throat, central chest pain, nausea, 2 episodes of nonbloody nonbilious emesis today, and generally not feeling well.  She has tried TheraFlu, Alka-Seltzer, and Tylenol without relief.  No known aggravating factors.  She has not had any sick contacts that she is aware of.  She is a non-smoker.  She did not receive her flu shot this year.  Of note, she has a history of PEs, she is not on any blood thinners, but she states that this does not feel like a PE.  She denies any ear pain or drainage, drooling, trismus, shortness of breath, leg swelling, recent travel/surgery/immobilization, estrogen use, abdominal pain, hematemesis, diarrhea, constipation, dysuria, hematuria, numbness, tingling, focal weakness, rashes, or any other complaints at this time.  The history is provided by the patient and medical records. No language interpreter was used."   Physical Exam  BP 129/84   Pulse (!) 108   Temp 100 F (37.8 C) (Oral)   Resp (!) 26   Ht 5\' 3"  (1.6 m)   Wt 74.8 kg   LMP 11/20/2018   SpO2 100%   BMI 29.23 kg/m   Physical Exam  Nontoxic appearing. Diminished breath sounds bilaterally. Scattered expiratory wheezes.  No focal TTP to the bilateral calves or thighs.  DP pulses are 2+ and symmetric.  ED Course/Procedures     Procedures  MDM   33 year old female with a history of asthma, PE x3 not on anticoagulation, anemia, bipolar disorder, headaches/migraines received at sign out from [facility] street pending labs.  Patient requested influenza testing. Influenza B testing is positive. Hgb is 9.5, down from 11.4.  However, patient reports 9.5 is actually better than her baseline based on outside records.  WBC is 3.7, likely secondary to influenza.  Pregnancy test is negative.  Chest x-ray is negative for pneumonia or other acute pathology.  Labs are otherwise reassuring.  Oral temp is 100.  Tylenol ordered.  She reports minimal improvement in her body aches after she was given Toradol.  IV fluids are running.  She does have diminished breath sounds bilaterally with minimal scattered wheezing bilaterally.  Chest pain is not pleuritic.  Will repeat albuterol nebulizer and plan to ambulate the patient on pulse ox.  She was ambulated after second albuterol nebulizer treatment and maintain an SaO2 of 98% with good waveform on room air.  She had a heart rate of greater than 100 and previous PE so she is unfortunately not PERC negative.  Last PE was in 2011.  States that her symptoms today are nothing like her prior PEs.  There is are also more likely explanations for her chest pain or shortness of breath as she had wheezing.  No evidence of heart strain or S1Q3T3 on EKG.  She does not meet criteria for Tamiflu as she has had symptoms for greater than 72 hours.  She is hemodynamically stable and in no acute distress.  Strict return precautions given.  She is safe for discharge to home with albuterol inhaler and supportive care with outpatient follow-up.  Joanne Gavel, PA-C 12/09/18 0113    Dorie Rank, MD 12/09/18 2145

## 2018-12-08 NOTE — ED Triage Notes (Signed)
Pt reports flu like symptoms beginning Wednesday night. Pt reports productive cough, body aches and chills.

## 2018-12-08 NOTE — ED Notes (Signed)
Patient transported to X-ray 

## 2018-12-09 MED ORDER — PROMETHAZINE-DM 6.25-15 MG/5ML PO SYRP: 5.0000 mL | ORAL_SOLUTION | Freq: Four times a day (QID) | ORAL | 0 refills | Status: DC | PRN

## 2018-12-09 MED ORDER — ONDANSETRON 4 MG PO TBDP: 4.0000 mg | ORAL_TABLET | Freq: Three times a day (TID) | ORAL | 0 refills | Status: DC | PRN

## 2018-12-09 NOTE — ED Notes (Signed)
Pt ambulated without oxygen without any difficulty. Oxygen saturation maintained 98-100%.

## 2018-12-09 NOTE — Discharge Instructions (Signed)
Thank you for allowing me to care for you today in the Emergency Department.   You tested positive today for influenza B. You had no signs of pneumonia on your chest X-ray.   Most viral illnesses significantly improve in 7 to 10 days.  Tamiflu is an antiviral medication, but is only indicated for influenza if it is started in the first 24 to 48 hours.  Antibiotics do not treat the flu.   To feel better, it is important to treat your symptoms.  He has 2 puffs of your albuterol inhaler every 4 hours as needed for wheezing or shortness of breath.  Let 1 tablet of Zofran dissolve in your tongue every 8 hours as needed for nausea or vomiting.  You can take 5 mL's of Promethazine DM every 6 hours as needed for cough or nasal congestion.  Take 650 mg of Tylenol or 6 or milligrams of ibuprofen with food every 6 hours as needed for fever, body aches, or headache.  Make sure to cover your mouth when you cough and wash your hands frequently with warm soap and water as influenza is contagious.  Use caution around small children, individuals that are immunocompromised, or elderly people as they can have a much more difficult course with the flu.  Return to the emergency department if you develop worsening shortness of breath, high fever despite taking Tylenol and ibuprofen as described above, if you stop making urine, if you develop persistent vomiting despite taking Zofran, or other new, concerning symptoms.

## 2018-12-31 ENCOUNTER — Other Ambulatory Visit: Payer: Self-pay | Admitting: Family Medicine

## 2018-12-31 DIAGNOSIS — A6000 Herpesviral infection of urogenital system, unspecified: Secondary | ICD-10-CM

## 2020-02-28 ENCOUNTER — Ambulatory Visit: Payer: Medicaid Other | Attending: Internal Medicine

## 2020-06-15 DIAGNOSIS — R5383 Other fatigue: Secondary | ICD-10-CM | POA: Diagnosis not present

## 2020-06-15 DIAGNOSIS — R635 Abnormal weight gain: Secondary | ICD-10-CM | POA: Diagnosis not present

## 2020-06-15 DIAGNOSIS — Z79899 Other long term (current) drug therapy: Secondary | ICD-10-CM | POA: Diagnosis not present

## 2020-06-15 DIAGNOSIS — Z131 Encounter for screening for diabetes mellitus: Secondary | ICD-10-CM | POA: Diagnosis not present

## 2020-06-15 DIAGNOSIS — D539 Nutritional anemia, unspecified: Secondary | ICD-10-CM | POA: Diagnosis not present

## 2020-06-15 DIAGNOSIS — R0602 Shortness of breath: Secondary | ICD-10-CM | POA: Diagnosis not present

## 2020-07-18 DIAGNOSIS — B350 Tinea barbae and tinea capitis: Secondary | ICD-10-CM | POA: Diagnosis not present

## 2020-07-18 DIAGNOSIS — R635 Abnormal weight gain: Secondary | ICD-10-CM | POA: Diagnosis not present

## 2020-08-16 DIAGNOSIS — Z20822 Contact with and (suspected) exposure to covid-19: Secondary | ICD-10-CM | POA: Diagnosis not present

## 2020-09-01 DIAGNOSIS — R635 Abnormal weight gain: Secondary | ICD-10-CM | POA: Diagnosis not present

## 2020-09-01 DIAGNOSIS — R5383 Other fatigue: Secondary | ICD-10-CM | POA: Diagnosis not present

## 2020-10-04 DIAGNOSIS — R059 Cough, unspecified: Secondary | ICD-10-CM | POA: Diagnosis not present

## 2020-10-08 DIAGNOSIS — L218 Other seborrheic dermatitis: Secondary | ICD-10-CM | POA: Diagnosis not present

## 2020-10-08 DIAGNOSIS — L738 Other specified follicular disorders: Secondary | ICD-10-CM | POA: Diagnosis not present

## 2020-10-19 DIAGNOSIS — R635 Abnormal weight gain: Secondary | ICD-10-CM | POA: Diagnosis not present

## 2020-10-19 DIAGNOSIS — G43909 Migraine, unspecified, not intractable, without status migrainosus: Secondary | ICD-10-CM | POA: Diagnosis not present

## 2020-10-27 DIAGNOSIS — Z20822 Contact with and (suspected) exposure to covid-19: Secondary | ICD-10-CM | POA: Diagnosis not present

## 2021-01-04 DIAGNOSIS — A6009 Herpesviral infection of other urogenital tract: Secondary | ICD-10-CM | POA: Diagnosis not present

## 2021-01-04 DIAGNOSIS — L209 Atopic dermatitis, unspecified: Secondary | ICD-10-CM | POA: Diagnosis not present

## 2021-01-04 DIAGNOSIS — N898 Other specified noninflammatory disorders of vagina: Secondary | ICD-10-CM | POA: Diagnosis not present

## 2021-01-04 DIAGNOSIS — Z79899 Other long term (current) drug therapy: Secondary | ICD-10-CM | POA: Diagnosis not present

## 2021-01-04 DIAGNOSIS — E7800 Pure hypercholesterolemia, unspecified: Secondary | ICD-10-CM | POA: Diagnosis not present

## 2021-01-04 DIAGNOSIS — R635 Abnormal weight gain: Secondary | ICD-10-CM | POA: Diagnosis not present

## 2021-01-04 DIAGNOSIS — Z7251 High risk heterosexual behavior: Secondary | ICD-10-CM | POA: Diagnosis not present

## 2021-03-02 DIAGNOSIS — R635 Abnormal weight gain: Secondary | ICD-10-CM | POA: Diagnosis not present

## 2021-03-02 DIAGNOSIS — L209 Atopic dermatitis, unspecified: Secondary | ICD-10-CM | POA: Diagnosis not present

## 2021-04-29 DIAGNOSIS — R635 Abnormal weight gain: Secondary | ICD-10-CM | POA: Diagnosis not present

## 2021-04-29 DIAGNOSIS — L209 Atopic dermatitis, unspecified: Secondary | ICD-10-CM | POA: Diagnosis not present

## 2021-09-17 DIAGNOSIS — R519 Headache, unspecified: Secondary | ICD-10-CM | POA: Diagnosis not present

## 2021-09-17 DIAGNOSIS — R635 Abnormal weight gain: Secondary | ICD-10-CM | POA: Diagnosis not present

## 2022-01-16 DIAGNOSIS — R051 Acute cough: Secondary | ICD-10-CM | POA: Diagnosis not present

## 2022-01-16 DIAGNOSIS — U071 COVID-19: Secondary | ICD-10-CM | POA: Diagnosis not present

## 2022-01-27 DIAGNOSIS — F4000 Agoraphobia, unspecified: Secondary | ICD-10-CM | POA: Diagnosis not present

## 2022-02-06 DIAGNOSIS — Z1159 Encounter for screening for other viral diseases: Secondary | ICD-10-CM | POA: Diagnosis not present

## 2022-02-06 DIAGNOSIS — Z0000 Encounter for general adult medical examination without abnormal findings: Secondary | ICD-10-CM | POA: Diagnosis not present

## 2022-02-06 DIAGNOSIS — Z131 Encounter for screening for diabetes mellitus: Secondary | ICD-10-CM | POA: Diagnosis not present

## 2022-02-06 DIAGNOSIS — E8881 Metabolic syndrome: Secondary | ICD-10-CM | POA: Diagnosis not present

## 2022-02-06 DIAGNOSIS — E669 Obesity, unspecified: Secondary | ICD-10-CM | POA: Diagnosis not present

## 2022-02-06 DIAGNOSIS — R5383 Other fatigue: Secondary | ICD-10-CM | POA: Diagnosis not present

## 2022-02-06 DIAGNOSIS — R350 Frequency of micturition: Secondary | ICD-10-CM | POA: Diagnosis not present

## 2022-02-06 DIAGNOSIS — E559 Vitamin D deficiency, unspecified: Secondary | ICD-10-CM | POA: Diagnosis not present

## 2022-02-06 DIAGNOSIS — N914 Secondary oligomenorrhea: Secondary | ICD-10-CM | POA: Diagnosis not present

## 2022-02-06 DIAGNOSIS — D539 Nutritional anemia, unspecified: Secondary | ICD-10-CM | POA: Diagnosis not present

## 2022-02-06 DIAGNOSIS — Z1331 Encounter for screening for depression: Secondary | ICD-10-CM | POA: Diagnosis not present

## 2022-02-06 DIAGNOSIS — R0602 Shortness of breath: Secondary | ICD-10-CM | POA: Diagnosis not present

## 2022-02-06 DIAGNOSIS — E349 Endocrine disorder, unspecified: Secondary | ICD-10-CM | POA: Diagnosis not present

## 2022-02-06 DIAGNOSIS — Z1339 Encounter for screening examination for other mental health and behavioral disorders: Secondary | ICD-10-CM | POA: Diagnosis not present

## 2022-02-06 DIAGNOSIS — Z79899 Other long term (current) drug therapy: Secondary | ICD-10-CM | POA: Diagnosis not present

## 2022-02-08 DIAGNOSIS — M7989 Other specified soft tissue disorders: Secondary | ICD-10-CM | POA: Diagnosis not present

## 2022-02-08 DIAGNOSIS — R222 Localized swelling, mass and lump, trunk: Secondary | ICD-10-CM | POA: Diagnosis not present

## 2022-02-15 DIAGNOSIS — F4000 Agoraphobia, unspecified: Secondary | ICD-10-CM | POA: Diagnosis not present

## 2022-02-16 ENCOUNTER — Telehealth: Payer: Self-pay | Admitting: Physician Assistant

## 2022-02-16 NOTE — Telephone Encounter (Signed)
Scheduled appt per 3/15 referral. Pt had seen Dr. Marin Olp in 2017, but when I spoke to her she said she preferred to be seen here in [place]. Pt is aware of appt date and time. Pt is aware to arrive 15 mins prior to appt time and to bring and updated insurance card. Pt is aware of appt location.   ?

## 2022-02-17 ENCOUNTER — Other Ambulatory Visit: Payer: Self-pay

## 2022-02-17 ENCOUNTER — Encounter: Payer: Self-pay | Admitting: Hematology & Oncology

## 2022-02-17 ENCOUNTER — Inpatient Hospital Stay: Payer: BC Managed Care – PPO | Attending: Physician Assistant

## 2022-02-17 ENCOUNTER — Inpatient Hospital Stay: Payer: BC Managed Care – PPO

## 2022-02-17 ENCOUNTER — Encounter: Payer: Self-pay | Admitting: *Deleted

## 2022-02-17 ENCOUNTER — Inpatient Hospital Stay: Payer: BC Managed Care – PPO | Admitting: Hematology and Oncology

## 2022-02-17 VITALS — BP 142/86 | HR 102 | Temp 97.9°F | Resp 18 | Ht 63.0 in | Wt 189.8 lb

## 2022-02-17 VITALS — BP 120/78 | HR 91 | Temp 98.6°F | Resp 18

## 2022-02-17 DIAGNOSIS — R531 Weakness: Secondary | ICD-10-CM

## 2022-02-17 DIAGNOSIS — R5383 Other fatigue: Secondary | ICD-10-CM

## 2022-02-17 DIAGNOSIS — D179 Benign lipomatous neoplasm, unspecified: Secondary | ICD-10-CM | POA: Insufficient documentation

## 2022-02-17 DIAGNOSIS — Z86711 Personal history of pulmonary embolism: Secondary | ICD-10-CM | POA: Insufficient documentation

## 2022-02-17 DIAGNOSIS — E559 Vitamin D deficiency, unspecified: Secondary | ICD-10-CM | POA: Diagnosis not present

## 2022-02-17 DIAGNOSIS — Z0289 Encounter for other administrative examinations: Secondary | ICD-10-CM | POA: Diagnosis not present

## 2022-02-17 DIAGNOSIS — D509 Iron deficiency anemia, unspecified: Secondary | ICD-10-CM

## 2022-02-17 LAB — CBC WITH DIFFERENTIAL (CANCER CENTER ONLY)
Abs Immature Granulocytes: 0.01 10*3/uL (ref 0.00–0.07)
Basophils Absolute: 0.1 10*3/uL (ref 0.0–0.1)
Basophils Relative: 1 %
Eosinophils Absolute: 0.3 10*3/uL (ref 0.0–0.5)
Eosinophils Relative: 5 %
HCT: 28.4 % — ABNORMAL LOW (ref 36.0–46.0)
Hemoglobin: 7.9 g/dL — ABNORMAL LOW (ref 12.0–15.0)
Immature Granulocytes: 0 %
Lymphocytes Relative: 32 %
Lymphs Abs: 2.2 10*3/uL (ref 0.7–4.0)
MCH: 18.4 pg — ABNORMAL LOW (ref 26.0–34.0)
MCHC: 27.8 g/dL — ABNORMAL LOW (ref 30.0–36.0)
MCV: 66 fL — ABNORMAL LOW (ref 80.0–100.0)
Monocytes Absolute: 0.4 10*3/uL (ref 0.1–1.0)
Monocytes Relative: 6 %
Neutro Abs: 3.8 10*3/uL (ref 1.7–7.7)
Neutrophils Relative %: 56 %
Platelet Count: 593 10*3/uL — ABNORMAL HIGH (ref 150–400)
RBC: 4.3 MIL/uL (ref 3.87–5.11)
RDW: 21.2 % — ABNORMAL HIGH (ref 11.5–15.5)
Smear Review: NORMAL
WBC Count: 6.8 10*3/uL (ref 4.0–10.5)
nRBC: 0 % (ref 0.0–0.2)

## 2022-02-17 LAB — SAMPLE TO BLOOD BANK

## 2022-02-17 LAB — IRON AND IRON BINDING CAPACITY (CC-WL,HP ONLY)
Iron: 18 ug/dL — ABNORMAL LOW (ref 28–170)
Saturation Ratios: 3 % — ABNORMAL LOW (ref 10.4–31.8)
TIBC: 563 ug/dL — ABNORMAL HIGH (ref 250–450)
UIBC: 545 ug/dL — ABNORMAL HIGH (ref 148–442)

## 2022-02-17 LAB — FERRITIN: Ferritin: <4 ng/mL — ABNORMAL LOW (ref 11–307)

## 2022-02-17 MED ORDER — SODIUM CHLORIDE 0.9 % IV SOLN: Freq: Once | INTRAVENOUS | Status: AC

## 2022-02-17 MED ORDER — SODIUM CHLORIDE 0.9 % IV SOLN
300.0000 mg | Freq: Once | INTRAVENOUS | Status: AC
  Administered 2022-02-17: 300 mg via INTRAVENOUS
  Filled 2022-02-17: qty 300

## 2022-02-17 NOTE — Progress Notes (Signed)
Pt hgb 7.9.  per MD pt needing to proceed with IV Venofer infusion today.  Pt scheduled.  ?

## 2022-02-17 NOTE — Progress Notes (Signed)
[place] ?CONSULT NOTE ? ?Patient Care Team: ?Copland, Gay Filler, MD as PCP - General (Family Medicine) ? ?CHIEF COMPLAINTS/PURPOSE OF CONSULTATION:  ?Iron deficiency anemia ? ?HISTORY OF PRESENTING ILLNESS:  ?Paula Benson 36 y.o. female is here because of extensive history of iron deficiency anemia.  She tells me that she has been iron deficient for the last several decades.  She has previously received blood transfusions 2-3 times.  She is also received IV iron treatments previously.  She was tried on multiple times of oral iron therapy without any benefit.  Today she feels extremely exhausted and tired.  She is unable to even function at her work.  Most recent labs done by her primary care office suggested a ferritin of 2.9.  We do not have a hemoglobin from that lab result.  She denies any heavy menstrual cycles.  She thinks of oral iron does not get absorbed and therefore she is becoming iron deficient. ? ?I reviewed her records extensively and collaborated the history with the patient. ? ?SUMMARY OF ONCOLOGIC HISTORY: ?Oncology History  ? No history exists.  ? ? ? ?MEDICAL HISTORY:  ?Past Medical History:  ?Diagnosis Date  ? Anemia   ? Anxiety   ? Asthma   ? Bipolar II disorder ([place])   ? Complication of anesthesia 2003  ? ASPIRATED DURING EMERGENCY CS  ? Depression   ? doing ok  ? Eclamptic seizure 2003  ? Fibroid   ? Genital herpes   ? Headache(784.0)   ? Herpes   ? History of blood clots   ? x3  ? History of blood transfusion MARCH 2013  ? Menometrorrhagia 07/27/2016  ? Migraines   ? Personal history of PE (pulmonary embolism) x 3, latest 02/2010  ? noncompliant with INR checks, must limit refills  ? Preeclampsia 2003  ? with first pregnancy  ? Pregnancy induced hypertension 2003  ? seized 2wks after  ? Pyelonephritis 2012  ? Seizures ([place]) 2003  ? after first delivery  ? Suicidal ideations   ? Urinary tract infection   ? ? ?SURGICAL HISTORY: ?Past Surgical History:  ?Procedure Laterality  Date  ? CESAREAN SECTION  12/2001 and 01/2008  ? CESAREAN SECTION  06/26/2012  ? Procedure: CESAREAN SECTION;  Surgeon: Lavonia Drafts, MD;  Location: [facility];  Service: Obstetrics;  Laterality: N/A;  ? INDUCED ABORTION  01/2010  ? ? ?SOCIAL HISTORY: ?Social History  ? ?Socioeconomic History  ? Marital status: Single  ?  Spouse name: Not on file  ? Number of children: Not on file  ? Years of education: Not on file  ? Highest education level: Not on file  ?Occupational History  ? Not on file  ?Tobacco Use  ? Smoking status: Never  ? Smokeless tobacco: Never  ?Substance and Sexual Activity  ? Alcohol use: No  ? Drug use: No  ? Sexual activity: Yes  ?  Birth control/protection: Condom  ?Other Topics Concern  ? Not on file  ?Social History Narrative  ? Student. Plans on staying here in [place] (originally living in [place]). 2 daughters.  ? ?Social Determinants of Health  ? ?Financial Resource Strain: Not on file  ?Food Insecurity: Not on file  ?Transportation Needs: Not on file  ?Physical Activity: Not on file  ?Stress: Not on file  ?Social Connections: Not on file  ?Intimate Partner Violence: Not on file  ? ? ?FAMILY HISTORY: ?Family History  ?Problem Relation Age of Onset  ? Diabetes Mother   ?  Type 2  ? Anemia Sister   ? Hypertension Sister   ? Anesthesia problems Neg Hx   ? ? ?ALLERGIES:  has No Known Allergies. ? ?MEDICATIONS:  ?Current Outpatient Medications  ?Medication Sig Dispense Refill  ? acetaminophen (TYLENOL) 500 MG tablet Take 1,000 mg by mouth every 6 (six) hours as needed for mild pain, moderate pain or headache.    ? acyclovir (ZOVIRAX) 200 MG capsule Take 2 capsules (400 mg total) by mouth 2 (two) times daily. 14 capsule 0  ? albuterol (PROVENTIL HFA;VENTOLIN HFA) 108 (90 Base) MCG/ACT inhaler Inhale 2 puffs into the lungs every 6 (six) hours as needed for wheezing or shortness of breath. 1 Inhaler 3  ? ALPRAZolam (XANAX) 0.25 MG tablet Take 1 tablet (0.25 mg total) by mouth 2 (two) times  daily as needed for anxiety. 20 tablet 0  ? ondansetron (ZOFRAN ODT) 4 MG disintegrating tablet Take 1 tablet (4 mg total) by mouth every 8 (eight) hours as needed for nausea or vomiting. 20 tablet 0  ? promethazine-dextromethorphan (PROMETHAZINE-DM) 6.25-15 MG/5ML syrup Take 5 mLs by mouth 4 (four) times daily as needed for cough. 118 mL 0  ? ?No current facility-administered medications for this visit.  ? ? ?REVIEW OF SYSTEMS:   ?Constitutional: Severe fatigue and generalized weakness ?All other systems were reviewed with the patient and are negative. ? ?PHYSICAL EXAMINATION: ?ECOG PERFORMANCE STATUS: 2 - Symptomatic, <50% confined to bed ? ?Vitals:  ? 02/17/22 1255  ?BP: (!) 142/86  ?Pulse: (!) 102  ?Resp: 18  ?Temp: 97.9 ?F (36.6 ?C)  ?SpO2: 97%  ? ?Filed Weights  ? 02/17/22 1255  ?Weight: 189 lb 12.8 oz (86.1 kg)  ? ?  ? ?LABORATORY DATA:  ?I have reviewed the data as listed ?Lab Results  ?Component Value Date  ? WBC 3.7 (L) 12/08/2018  ? HGB 9.5 (L) 12/08/2018  ? HCT 33.4 (L) 12/08/2018  ? MCV 73.1 (L) 12/08/2018  ? PLT 404 (H) 12/08/2018  ? ?Lab Results  ?Component Value Date  ? NA 135 12/08/2018  ? K 3.6 12/08/2018  ? CL 102 12/08/2018  ? CO2 23 12/08/2018  ? ? ?RADIOGRAPHIC STUDIES: ?I have personally reviewed the radiological reports and agreed with the findings in the report. ? ?ASSESSMENT AND PLAN:  ?Iron deficiency anemia ?Chronic iron deficiency anemia: ?Labs 02/06/2022: Ferritin 2.6, TIBC 669, there was no hemoglobin available to review. ?Patient tells me that she has had iron deficiency anemia for a long long time.  Over the course of several years she has received iron infusions as well as blood transfusions.  She is also taking oral iron replacement therapy which apparently does not improve her symptoms. ?Lately she is extremely exhausted to the point that she is sleeping in the chair.  She is accompanied today by her husband. ?She denies any blood in the stool or heavy menstrual cycles. ?Counseling:  I discussed with her about the process of iron absorption as a suspect malabsorption is possibly the cause of her iron deficiency. ?(She had a prior history of multiple PEs and had seen Dr. Marin Olp previously) ? ?Treatment plan: 3 doses of IV Venofer as soon as possible ?Recheck labs in 3 months and follow-up after that to discuss results ? ? ? ?All questions were answered. The patient knows to call the clinic with any problems, questions or concerns. ?  ? Harriette Ohara, MD ?02/17/22 ? ?

## 2022-02-17 NOTE — Assessment & Plan Note (Signed)
Chronic iron deficiency anemia: ?Labs 02/06/2022: Ferritin 2.6, TIBC 669, there was no hemoglobin available to review. ?Patient tells me that she has had iron deficiency anemia for a long long time.  Over the course of several years she has received iron infusions as well as blood transfusions.  She is also taking oral iron replacement therapy which apparently does not improve her symptoms. ?Lately she is extremely exhausted to the point that she is sleeping in the chair.  She is accompanied today by her husband. ?She denies any blood in the stool or heavy menstrual cycles. ?Counseling: I discussed with her about the process of iron absorption as a suspect malabsorption is possibly the cause of her iron deficiency. ?(She had a prior history of multiple PEs and had seen Dr. Marin Olp previously) ? ?Treatment plan: 3 doses of IV Venofer as soon as possible ?Recheck labs in 3 months and follow-up after that to discuss results ? ? ?

## 2022-02-17 NOTE — Progress Notes (Signed)
Patient tolerated IV iron infusion well. Monitored for 30 minute post observation period. VSS, ambulatory to lobby.  

## 2022-02-17 NOTE — Patient Instructions (Signed)

## 2022-02-21 ENCOUNTER — Inpatient Hospital Stay: Payer: BC Managed Care – PPO

## 2022-02-21 ENCOUNTER — Other Ambulatory Visit: Payer: Self-pay

## 2022-02-21 VITALS — BP 122/73 | HR 81 | Temp 98.4°F | Resp 17

## 2022-02-21 DIAGNOSIS — D179 Benign lipomatous neoplasm, unspecified: Secondary | ICD-10-CM | POA: Diagnosis not present

## 2022-02-21 DIAGNOSIS — R5383 Other fatigue: Secondary | ICD-10-CM | POA: Diagnosis not present

## 2022-02-21 DIAGNOSIS — Z7901 Long term (current) use of anticoagulants: Secondary | ICD-10-CM

## 2022-02-21 DIAGNOSIS — Z86711 Personal history of pulmonary embolism: Secondary | ICD-10-CM | POA: Diagnosis not present

## 2022-02-21 DIAGNOSIS — N921 Excessive and frequent menstruation with irregular cycle: Secondary | ICD-10-CM

## 2022-02-21 DIAGNOSIS — D509 Iron deficiency anemia, unspecified: Secondary | ICD-10-CM | POA: Diagnosis not present

## 2022-02-21 DIAGNOSIS — R531 Weakness: Secondary | ICD-10-CM | POA: Diagnosis not present

## 2022-02-21 MED ORDER — SODIUM CHLORIDE 0.9 % IV SOLN: Freq: Once | INTRAVENOUS | Status: AC

## 2022-02-21 MED ORDER — SODIUM CHLORIDE 0.9 % IV SOLN
300.0000 mg | Freq: Once | INTRAVENOUS | Status: AC
  Administered 2022-02-21: 300 mg via INTRAVENOUS
  Filled 2022-02-21: qty 300

## 2022-02-21 NOTE — Patient Instructions (Signed)

## 2022-02-22 LAB — CELIAC PANEL 10
Antigliadin Abs, IgA: 9 units (ref 0–19)
Endomysial Ab, IgA: NEGATIVE
Gliadin IgG: 6 units (ref 0–19)
IgA: 305 mg/dL (ref 87–352)
Tissue Transglut Ab: 3 U/mL (ref 0–5)
Tissue Transglutaminase Ab, IgA: <2 U/mL (ref 0–3)

## 2022-02-28 ENCOUNTER — Other Ambulatory Visit: Payer: BC Managed Care – PPO

## 2022-02-28 ENCOUNTER — Encounter: Payer: BC Managed Care – PPO | Admitting: Physician Assistant

## 2022-02-28 ENCOUNTER — Inpatient Hospital Stay: Payer: BC Managed Care – PPO

## 2022-02-28 ENCOUNTER — Other Ambulatory Visit: Payer: Self-pay

## 2022-02-28 ENCOUNTER — Encounter: Payer: Self-pay | Admitting: *Deleted

## 2022-02-28 VITALS — BP 119/67 | HR 98 | Temp 98.6°F | Resp 17

## 2022-02-28 DIAGNOSIS — D509 Iron deficiency anemia, unspecified: Secondary | ICD-10-CM

## 2022-02-28 DIAGNOSIS — Z7901 Long term (current) use of anticoagulants: Secondary | ICD-10-CM

## 2022-02-28 DIAGNOSIS — D179 Benign lipomatous neoplasm, unspecified: Secondary | ICD-10-CM | POA: Diagnosis not present

## 2022-02-28 DIAGNOSIS — N921 Excessive and frequent menstruation with irregular cycle: Secondary | ICD-10-CM

## 2022-02-28 DIAGNOSIS — Z86711 Personal history of pulmonary embolism: Secondary | ICD-10-CM | POA: Diagnosis not present

## 2022-02-28 DIAGNOSIS — R5383 Other fatigue: Secondary | ICD-10-CM | POA: Diagnosis not present

## 2022-02-28 DIAGNOSIS — R531 Weakness: Secondary | ICD-10-CM | POA: Diagnosis not present

## 2022-02-28 MED ORDER — SODIUM CHLORIDE 0.9 % IV SOLN
300.0000 mg | Freq: Once | INTRAVENOUS | Status: AC
  Administered 2022-02-28: 300 mg via INTRAVENOUS
  Filled 2022-02-28: qty 300

## 2022-02-28 MED ORDER — ACETAMINOPHEN 325 MG PO TABS
650.0000 mg | ORAL_TABLET | Freq: Once | ORAL | Status: AC
  Administered 2022-02-28: 650 mg via ORAL
  Filled 2022-02-28: qty 2

## 2022-02-28 MED ORDER — SODIUM CHLORIDE 0.9 % IV SOLN: Freq: Once | INTRAVENOUS | Status: AC

## 2022-02-28 MED ORDER — DEXAMETHASONE 4 MG PO TABS
2.0000 mg | ORAL_TABLET | Freq: Once | ORAL | Status: AC
  Administered 2022-02-28: 2 mg via ORAL
  Filled 2022-02-28: qty 1

## 2022-02-28 MED ORDER — DIPHENHYDRAMINE HCL 25 MG PO CAPS
25.0000 mg | ORAL_CAPSULE | Freq: Once | ORAL | Status: AC
  Administered 2022-02-28: 25 mg via ORAL
  Filled 2022-02-28: qty 1

## 2022-02-28 NOTE — Progress Notes (Signed)
Pt in infusion today and requesting appt with MD to discuss ongoing concerns and symptoms.  Pt requesting next available in office appt.  Appt scheduled and pt verbalized understanding of date and time.  ?

## 2022-02-28 NOTE — Progress Notes (Signed)
Pt voiced concerns about her kidneys and pain in her back. MD made aware, pre-meds added and pt to see MD tomorrow.  ? ?Pt stayed for 30 min observation, tolerated well and d/c'ed in stable condition with VSS ?

## 2022-02-28 NOTE — Patient Instructions (Signed)

## 2022-03-01 ENCOUNTER — Inpatient Hospital Stay: Payer: BC Managed Care – PPO | Admitting: Hematology and Oncology

## 2022-03-01 ENCOUNTER — Telehealth: Payer: Self-pay | Admitting: *Deleted

## 2022-03-01 ENCOUNTER — Telehealth: Payer: Self-pay | Admitting: Family Medicine

## 2022-03-01 DIAGNOSIS — R5383 Other fatigue: Secondary | ICD-10-CM | POA: Diagnosis not present

## 2022-03-01 DIAGNOSIS — Z86711 Personal history of pulmonary embolism: Secondary | ICD-10-CM | POA: Diagnosis not present

## 2022-03-01 DIAGNOSIS — D509 Iron deficiency anemia, unspecified: Secondary | ICD-10-CM

## 2022-03-01 DIAGNOSIS — D179 Benign lipomatous neoplasm, unspecified: Secondary | ICD-10-CM | POA: Diagnosis not present

## 2022-03-01 DIAGNOSIS — R531 Weakness: Secondary | ICD-10-CM | POA: Diagnosis not present

## 2022-03-01 NOTE — Telephone Encounter (Signed)
Pt is hoping to re-establish care with Dr.Copland. please advise.  ?

## 2022-03-01 NOTE — Telephone Encounter (Signed)
Please advise 

## 2022-03-01 NOTE — Telephone Encounter (Signed)
With following call, informed Ed Blalock this forms nurse will notify provider to advise which office should certify request for leave and disability.  Reviewed form and ([place]) H.I.M. record release process.  [facility] [employer] form and [place] cover sheet E-mailed to patient. ? ?"Paula Benson [phone] (home) calling to see if you received forms e-mailed yesterday.   ?Two e-mails sent because [facility] needs records to prove my disability in order for me to get paid for time out of work.   ?Currently out on continuous leave February 17, 2022 through Apr 04, 2022.   ?Iron extremely low with complications from infusions.  Very sensitive to the iron infusions.  Takes three to four days to be able to move my body and recover because my body hurts and I am weak.   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  So weak I keep my head down on my desk ?Do not return there until June but being seen at another office as referred by urgent care.  I believe the records employer needs they need are from [facility] office." ? ?Informed Ed Blalock this forms nurse will notify provider to advise which office should certify request for leave and disability.  Reviewed form and record release process.  [facility] [employer] form E-mailed to patient. ?

## 2022-03-01 NOTE — Assessment & Plan Note (Signed)
Chronic iron deficiency anemia: ?Labs  ?02/06/2022: Ferritin 2.6, TIBC 669, there was no hemoglobin available to review. ?02/17/2022: Hemoglobin 7.9, MCV 66, RDW 21.2, platelets 593, ? ?Patient tells me that she has had iron deficiency anemia for a long long time.  Over the course of several years she has received iron infusions as well as blood transfusions.  She is also taking oral iron replacement therapy which apparently does not improve her symptoms. ? ?(She had a prior history of multiple PEs and had seen Dr. Marin Olp previously) ?? ?Treatment plan: 3 doses of IV Venofer as soon as possible ?Recheck labs in 3 months and follow-up after that to discuss results ?

## 2022-03-01 NOTE — Progress Notes (Signed)
? ?Patient Care Team: ?Copland, Gay Filler, MD as PCP - General (Family Medicine) ? ?DIAGNOSIS:  ?Encounter Diagnosis  ?Name Primary?  ? Iron deficiency anemia, unspecified iron deficiency anemia type   ? ?  ?CHIEF COMPLIANT: Iron deficiency anemia ? ?INTERVAL HISTORY: Paula Benson is a 36 y.o. female is here because of extensive history of iron deficiency anemia. She presents to the clinic today for follow-up. She complained of being fatigue after the first 2. She also complained of headaches. She was concerned about lumps on collarbone side of her breast and back. ?She felt several soft tissue lumps and wanted to make sure that these are benign or the need to be biopsied. ? ? ?ALLERGIES:  has No Known Allergies. ? ?MEDICATIONS:  ?Current Outpatient Medications  ?Medication Sig Dispense Refill  ? acetaminophen (TYLENOL) 500 MG tablet Take 1,000 mg by mouth every 6 (six) hours as needed for mild pain, moderate pain or headache.    ? acyclovir (ZOVIRAX) 200 MG capsule Take 2 capsules (400 mg total) by mouth 2 (two) times daily. 14 capsule 0  ? albuterol (PROVENTIL HFA;VENTOLIN HFA) 108 (90 Base) MCG/ACT inhaler Inhale 2 puffs into the lungs every 6 (six) hours as needed for wheezing or shortness of breath. 1 Inhaler 3  ? ALPRAZolam (XANAX) 0.25 MG tablet Take 1 tablet (0.25 mg total) by mouth 2 (two) times daily as needed for anxiety. 20 tablet 0  ? ondansetron (ZOFRAN ODT) 4 MG disintegrating tablet Take 1 tablet (4 mg total) by mouth every 8 (eight) hours as needed for nausea or vomiting. 20 tablet 0  ? promethazine-dextromethorphan (PROMETHAZINE-DM) 6.25-15 MG/5ML syrup Take 5 mLs by mouth 4 (four) times daily as needed for cough. 118 mL 0  ? ?No current facility-administered medications for this visit.  ? ? ?PHYSICAL EXAMINATION: ?ECOG PERFORMANCE STATUS: 1 - Symptomatic but completely ambulatory ? ?Vitals:  ? 03/01/22 0925  ?BP: (!) 142/87  ?Pulse: 82  ?Resp: 18  ?Temp: (!) 97.5 ?F (36.4 ?C)  ?SpO2: 100%   ? ?Filed Weights  ? 03/01/22 0925  ?Weight: 195 lb 1.6 oz (88.5 kg)  ?  ? ?LABORATORY DATA:  ?I have reviewed the data as listed ? ?  Latest Ref Rng & Units 12/08/2018  ? 10:37 PM 11/05/2017  ? 12:17 AM 08/23/2016  ?  4:41 PM  ?CMP  ?Glucose 70 - 99 mg/dL 85   80   90    ?BUN 6 - 20 mg/dL '13   17   17    '$ ?Creatinine 0.44 - 1.00 mg/dL 0.91   1.02   0.93    ?Sodium 135 - 145 mmol/L 135   136   139    ?Potassium 3.5 - 5.1 mmol/L 3.6   4.0   3.6    ?Chloride 98 - 111 mmol/L 102   106   106    ?CO2 22 - 32 mmol/L '23   23   23    '$ ?Calcium 8.9 - 10.3 mg/dL 8.5   9.2   9.5    ?Total Protein 6.5 - 8.1 g/dL 7.7    8.5    ?Total Bilirubin 0.3 - 1.2 mg/dL 0.4    0.8    ?Alkaline Phos 38 - 126 U/L 43    47    ?AST 15 - 41 U/L 30    21    ?ALT 0 - 44 U/L 18    14    ? ? ?Lab Results  ?Component Value  Date  ? WBC 6.8 02/17/2022  ? HGB 7.9 (L) 02/17/2022  ? HCT 28.4 (L) 02/17/2022  ? MCV 66.0 (L) 02/17/2022  ? PLT 593 (H) 02/17/2022  ? NEUTROABS 3.8 02/17/2022  ? ? ?ASSESSMENT & PLAN:  ?Iron deficiency anemia ?Chronic iron deficiency anemia: ?Labs  ?02/06/2022: Ferritin 2.6, TIBC 669, there was no hemoglobin available to review. ?02/17/2022: Hemoglobin 7.9, MCV 66, RDW 21.2, platelets 593, ? ?Patient tells me that she has had iron deficiency anemia for a long long time.  Over the course of several years she has received iron infusions as well as blood transfusions.  She is also taking oral iron replacement therapy which apparently does not improve her symptoms. ? ?(She had a prior history of multiple PEs and had seen Dr. Marin Olp previously) ?  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 ? ? ?No orders of the defined types were placed in this encounter. ? ?The patient has a good understanding of the overall plan. she agrees with it. she will call with any problems  that may develop before the next visit here. ?Total time spent: 30 mins including face to face time and time spent for planning, charting and co-ordination of care ? ? Harriette Ohara, MD ?03/01/22 ? ? ? I Gardiner Coins am scribing for Dr. Lindi Adie ? ?I have reviewed the above documentation for accuracy and completeness, and I agree with the above. ?  ?

## 2022-03-01 NOTE — Telephone Encounter (Signed)
Okay to re-establish? 

## 2022-03-01 NOTE — Telephone Encounter (Signed)
Connected with Ed Blalock [phone]).  Advised our office able to certify a short term leave effective 02/17/2022 through 03/07/2022.  Request any further leave, if needed from other provider. ? ?"Thank you, that's fine.  Sent some of my health information from [facility] to them.  Yes, I received release forms sent earlier.  Will have to log in, complete and sign to return."     ?

## 2022-03-06 NOTE — Progress Notes (Signed)
Therapist, music at [facility] ?[address], [address] ?[place],  [address] ?336 [phone] ?Fax [phone] ? ?Date:  03/10/2022  ? ?Name:  Paula Benson   DOB:  26-May-1986   MRN:  [id number] ? ?PCP:  Darreld Mclean, MD  ? ? ?Chief Complaint: re-establish care (Had some iorn infusion ) ? ? ?History of Present Illness: ? ?Paula Benson is a 36 y.o. very pleasant female patient who presents with the following: ? ?Pt seen today to re-establish care ?Last seen by myself in 2018 ?She is seeing hematology for iron deficiency anemia/ menorrhagia  ?She was last seen by hematology in March - most recent iron infusion done 3/27 ? ?She had been under treatment for bipolar disorder in the past- she states she "used to have this" but is now just seeing a therapist for her OCD, not currently on any mood stabilizer or other psychotropic medication ?She has been using phentermine off and on for 5 years ?She notes she is having a hard time getting weight off -phentermine no longer seems to be working ? ?We discussed potentially starting a GLP-1 ?Recent A1c was 5.6%  ?No history of pancreatis ?No personal or family history of MEN or thyroid cancer  ?TSH 1.258 02/06/22 per Romelle Starcher  ? ?Wt Readings from Last 3 Encounters:  ?03/10/22 193 lb 3.2 oz (87.6 kg)  ?03/01/22 195 lb 1.6 oz (88.5 kg)  ?02/17/22 189 lb 12.8 oz (86.1 kg)  ? ?Also, she notes some "bumps on my body which are concerning her ? ?Patient Active Problem List  ? Diagnosis Date Noted  ? Menometrorrhagia 07/27/2016  ? 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 CAP (community acquired pneumonia) 11/02/2014  ? Pyelonephritis 10/31/2014  ? Hypokalemia 10/31/2014  ? Neck pain 02/05/2014  ? Migraine, unspecified, without mention of intractable migraine without mention of status migrainosus 08/14/2013  ? Palpitations 04/12/2012  ? Hx of preeclampsia, prior pregnancy, currently pregnant--had eclamptic seizure 2 weeks postpartum 02/20/2012  ?  History of aspiration pneumonitis 02/20/2012  ? Iron deficiency anemia 02/20/2012  ? Recurrent urinary tract infection 08/25/2011  ? Genital herpes 06/09/2011  ? Panic disorder 03/21/2011  ? History of pulmonary embolus (PE) 03/17/2011  ? Chronic anticoagulation 03/17/2011  ? ASTHMA, INTERMITTENT 03/04/2010  ? ? ?Past Medical History:  ?Diagnosis Date  ? Anemia   ? Anxiety   ? Asthma   ? Bipolar II disorder ([place])   ? Complication of anesthesia 2003  ? ASPIRATED DURING EMERGENCY CS  ? Depression   ? doing ok  ? Eclamptic seizure 2003  ? Fibroid   ? Genital herpes   ? Headache(784.0)   ? Herpes   ? History of blood clots   ? x3  ? History of blood transfusion MARCH 2013  ? Menometrorrhagia 07/27/2016  ? Migraines   ? Personal history of PE (pulmonary embolism) x 3, latest 02/2010  ? noncompliant with INR checks, must limit refills  ? Preeclampsia 2003  ? with first pregnancy  ? Pregnancy induced hypertension 2003  ? seized 2wks after  ? Pyelonephritis 2012  ? Seizures ([place]) 2003  ? after first delivery  ? Suicidal ideations   ? Urinary tract infection   ? ? ?Past Surgical History:  ?Procedure Laterality Date  ? CESAREAN SECTION  12/2001 and 01/2008  ? CESAREAN SECTION  06/26/2012  ? Procedure: CESAREAN SECTION;  Surgeon: Lavonia Drafts, MD;  Location: [facility];  Service: Obstetrics;  Laterality: N/A;  ?  INDUCED ABORTION  01/2010  ? ? ?Social History  ? ?Tobacco Use  ? Smoking status: Never  ? Smokeless tobacco: Never  ?Substance Use Topics  ? Alcohol use: No  ? Drug use: No  ? ? ?Family History  ?Problem Relation Age of Onset  ? Diabetes Mother   ?     Type 2  ? Anemia Sister   ? Hypertension Sister   ? Anesthesia problems Neg Hx   ? ? ?No Known Allergies ? ?Medication list has been reviewed and updated. ? ?Current Outpatient Medications on File Prior to Visit  ?Medication Sig Dispense Refill  ? acetaminophen (TYLENOL) 500 MG tablet Take 1,000 mg by mouth every 6 (six) hours as needed for mild pain, moderate pain or  headache.    ? acyclovir (ZOVIRAX) 200 MG capsule Take 2 capsules (400 mg total) by mouth 2 (two) times daily. 14 capsule 0  ? phentermine (ADIPEX-P) 37.5 MG tablet Take 1 tablet by mouth daily.    ? Vitamin D, Ergocalciferol, (DRISDOL) 1.25 MG (50000 UNIT) CAPS capsule Take 50,000 Units by mouth once a week.    ? ?No current facility-administered medications on file prior to visit.  ? ? ?Review of Systems: ? ?As per HPI- otherwise negative. ? ? ?Physical Examination: ?Vitals:  ? 03/10/22 1307  ?BP: 130/80  ?Pulse: 77  ?Temp: 98.9 ?F (37.2 ?C)  ?SpO2: 99%  ? ?Vitals:  ? 03/10/22 1307  ?Weight: 193 lb 3.2 oz (87.6 kg)  ?Height: '5\' 3"'$  (1.6 m)  ? ?Body mass index is 34.22 kg/m?. ?Ideal Body Weight: Weight in (lb) to have BMI = 25: 140.8 ? ?GEN: no acute distress.  Mildly obese, looks well ?HEENT: Atraumatic, Normocephalic.  ?Ears and Nose: No external deformity. ?CV: RRR, No M/G/R. No JVD. No thrill. No extra heart sounds. ?PULM: CTA B, no wheezes, crackles, rhonchi. No retractions. No resp. distress. No accessory muscle use. ?ABD: S, NT, ND, +BS. No rebound. No HSM. ?EXTR: No c/c/e ?PSYCH: Normally interactive. Conversant.  ?She has an approximately 4 x 3 cm mobile mass in the right axillary region, likely posterior to any breast tissue.   ?There is an approximately 1 cm mobile mass just inferior to the right clavicle ?She has a larger apparent lipoma over her right shoulder blade ? ?Assessment and Plan: ?Axillary mass, right - Plan: MM DIAG BREAST TOMO BILATERAL, US BREAST COMPLETE UNI RIGHT INC AXILLA ? ?Mass, chest - Plan: [place] CHEST SOFT TISSUE ? ?Obesity (BMI 30.0-34.9) ? ?Reestablishing care today.  She had recent lab work done per [facility] which we reviewed on her phone today. ?We will obtain ultrasound of the small likely cyst of her right clavicle to ensure not a lymph node. ?Ordered right breast ultrasound and bilateral diagnostic mammogram for right axillary mass, likely also lipoma. ?Suspect  mass over right scapula is a lipoma, patient declines further evaluation at this okay, let me try prescribing Ozempic for you. ? ?Signed ?Lamar Blinks, MD ? ?

## 2022-03-07 ENCOUNTER — Inpatient Hospital Stay: Payer: BC Managed Care – PPO

## 2022-03-08 ENCOUNTER — Telehealth: Payer: Self-pay | Admitting: *Deleted

## 2022-03-08 DIAGNOSIS — F4000 Agoraphobia, unspecified: Secondary | ICD-10-CM | POA: Diagnosis not present

## 2022-03-08 NOTE — Telephone Encounter (Signed)
Paperwork successfully faxed to [facility] fax no. [phone].  Original copy mailed to patient address on file. ? ?[address] ?[place] [address] ? ?No further form instructions received or performed by this nurse. ? ?

## 2022-03-08 NOTE — Telephone Encounter (Signed)
03/08/2022: FMLA paperwork received 03/01/2022 completed today by this forms nurse.  Form to designated mail area for collaborative pick up for provider review and signature.      ?

## 2022-03-09 DIAGNOSIS — E559 Vitamin D deficiency, unspecified: Secondary | ICD-10-CM | POA: Diagnosis not present

## 2022-03-09 DIAGNOSIS — R635 Abnormal weight gain: Secondary | ICD-10-CM | POA: Diagnosis not present

## 2022-03-09 DIAGNOSIS — D508 Other iron deficiency anemias: Secondary | ICD-10-CM | POA: Diagnosis not present

## 2022-03-09 DIAGNOSIS — E669 Obesity, unspecified: Secondary | ICD-10-CM | POA: Diagnosis not present

## 2022-03-09 NOTE — Telephone Encounter (Signed)
Message left for Paula Benson [phone] (home) requesting status of Paula Benson for (SW) H.I.M. if she would like records released to [facility] and re-sent release via e-mail to [email or web address].     ?

## 2022-03-10 ENCOUNTER — Encounter: Payer: Self-pay | Admitting: Family Medicine

## 2022-03-10 ENCOUNTER — Ambulatory Visit: Payer: BC Managed Care – PPO | Admitting: Family Medicine

## 2022-03-10 VITALS — BP 130/80 | HR 77 | Temp 98.9°F | Ht 63.0 in | Wt 193.2 lb

## 2022-03-10 DIAGNOSIS — E669 Obesity, unspecified: Secondary | ICD-10-CM | POA: Diagnosis not present

## 2022-03-10 DIAGNOSIS — R222 Localized swelling, mass and lump, trunk: Secondary | ICD-10-CM

## 2022-03-10 DIAGNOSIS — R2231 Localized swelling, mass and lump, right upper limb: Secondary | ICD-10-CM

## 2022-03-10 MED ORDER — WEGOVY 0.25 MG/0.5ML ~~LOC~~ SOAJ: 0.2500 mg | SUBCUTANEOUS | 0 refills | Status: DC

## 2022-03-10 NOTE — Patient Instructions (Addendum)
Please contact your insurance and ask them which GLP-1 drug they prefer and if they cover these drugs only for diabetes or for obesity treatment as well- let me know what you find out!   ? ?We will get an ultrasound of the bump over your right clavicle, and we will have your right armpit area checked by the [facility] in [place]- I will call them and see if they need a mammogram or just an ultrasound  ? ?I think you may be due for a pap- please see me at your convenience and we can update this for you  ? ? ?

## 2022-03-11 ENCOUNTER — Ambulatory Visit (HOSPITAL_BASED_OUTPATIENT_CLINIC_OR_DEPARTMENT_OTHER): Admission: RE | Admit: 2022-03-11 | Payer: BC Managed Care – PPO | Source: Ambulatory Visit

## 2022-03-14 ENCOUNTER — Telehealth: Payer: Self-pay

## 2022-03-14 NOTE — Telephone Encounter (Signed)
PA initiated via Covermymeds; KEY: [id number]. Awaiting determination.  ?

## 2022-03-14 NOTE — Telephone Encounter (Signed)
PA approved. Effective 03/14/2022 to 10/14/2022 ?

## 2022-03-15 ENCOUNTER — Encounter: Payer: Self-pay | Admitting: Hematology and Oncology

## 2022-03-15 NOTE — Telephone Encounter (Signed)
03/15/2022 Unable to forward records request to [place] H.I.M. without signed [place] not received to date.  Paula Benson reported sending her MyChart records to [facility].  See previous documentation. ? ?No further activity performed by this nurse.  ?

## 2022-03-16 ENCOUNTER — Ambulatory Visit (HOSPITAL_BASED_OUTPATIENT_CLINIC_OR_DEPARTMENT_OTHER): Admission: RE | Admit: 2022-03-16 | Payer: BC Managed Care – PPO | Source: Ambulatory Visit

## 2022-03-16 DIAGNOSIS — F4000 Agoraphobia, unspecified: Secondary | ICD-10-CM | POA: Diagnosis not present

## 2022-03-18 ENCOUNTER — Telehealth: Payer: Self-pay

## 2022-03-18 NOTE — Telephone Encounter (Signed)
Paula Benson with [employer] called needing more information from the appt pt had with PCP on 03/10/22 regarding how abnormal findings limits patient with her job demands.  You can reach [place] back at [phone] and LMOVM if you cannot reach her as it is a secure VM. ?

## 2022-03-21 NOTE — Telephone Encounter (Signed)
Lvm on [place] line. I see that Oncology filled out paperwork for the pt.  ?

## 2022-03-23 DIAGNOSIS — F4000 Agoraphobia, unspecified: Secondary | ICD-10-CM | POA: Diagnosis not present

## 2022-03-29 DIAGNOSIS — F4000 Agoraphobia, unspecified: Secondary | ICD-10-CM | POA: Diagnosis not present

## 2022-04-05 ENCOUNTER — Ambulatory Visit
Admission: RE | Admit: 2022-04-05 | Discharge: 2022-04-05 | Disposition: A | Discharge status: Home / Self Care | Payer: BC Managed Care – PPO | Source: Ambulatory Visit | Attending: Family Medicine | Admitting: Family Medicine

## 2022-04-05 DIAGNOSIS — R2231 Localized swelling, mass and lump, right upper limb: Secondary | ICD-10-CM

## 2022-04-05 DIAGNOSIS — N6489 Other specified disorders of breast: Secondary | ICD-10-CM | POA: Diagnosis not present

## 2022-04-05 DIAGNOSIS — R928 Other abnormal and inconclusive findings on diagnostic imaging of breast: Secondary | ICD-10-CM | POA: Diagnosis not present

## 2022-04-05 NOTE — Progress Notes (Deleted)
[place] at [facility] [address] [place], [place] [address] [phone]  Date:  04/07/2022   Name:  Paula Benson   DOB:  [birth year]   MRN:  [id number]  PCP:  Darreld Mclean, MD    Chief Complaint: No chief complaint on file.   History of Present Illness:  Paula Benson is a 36 y.o. very pleasant female patient who presents with the following:  Patient seen today for follow-up Most recent visit with myself was last month to reestablish care She has history of iron deficiency anemia/menorrhagia, has received iron infusions At her last visit she was worried about weight gain and difficulty with weight loss -We started her on Wegovy at her last visit -Last hemoglobin 7.9 per oncology, March 16  Pap can be updated if not done per her GYN  She also recently had a mammogram for concern of a mass in the axillary region- looks ok  IMPRESSION: 1. LATERAL RIGHT chest lipoma accounting for this patient's palpable abnormality. 2. No mammographic evidence of breast malignancy. 3. Benign LEFT breast calcifications.   RECOMMENDATION: Bilateral screening mammogram at age 35.  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   CAP (community acquired pneumonia) 11/02/2014   Pyelonephritis 10/31/2014   Hypokalemia 10/31/2014   Neck pain 02/05/2014   Migraine, unspecified, without mention of intractable migraine without mention of status migrainosus 08/14/2013   Palpitations 04/12/2012   Hx of preeclampsia, prior pregnancy, currently pregnant--had eclamptic seizure 2 weeks postpartum 02/20/2012   History of aspiration pneumonitis 02/20/2012   Iron deficiency anemia 02/20/2012   Recurrent urinary tract infection 08/25/2011   Genital herpes 06/09/2011   Panic disorder 03/21/2011   History of pulmonary embolus (PE) 03/17/2011   Chronic  anticoagulation 03/17/2011   ASTHMA, INTERMITTENT 03/04/2010    Past Medical History:  Diagnosis Date   Anemia    Anxiety    Asthma    Bipolar II disorder ([place])    Complication of anesthesia 2003   ASPIRATED DURING EMERGENCY CS   Depression    doing ok   Eclamptic seizure 2003   Fibroid    Genital herpes    Headache(784.0)    Herpes    History of blood clots    x3   History of blood transfusion MARCH 2013   Menometrorrhagia 07/27/2016   Migraines    Personal history of PE (pulmonary embolism) x 3, latest 02/2010   noncompliant with INR checks, must limit refills   Preeclampsia 2003   with first pregnancy   Pregnancy induced hypertension 2003   seized 2wks after   Pyelonephritis 2012   Seizures ([place]) 2003   after first delivery   Suicidal ideations    Urinary tract infection     Past Surgical History:  Procedure Laterality Date   CESAREAN SECTION  12/2001 and 01/2008   CESAREAN SECTION  06/26/2012   Procedure: CESAREAN SECTION;  Surgeon: Lavonia Drafts, MD;  Location: [facility];  Service: Obstetrics;  Laterality: N/A;   INDUCED ABORTION  01/2010    Social History   Tobacco Use   Smoking status: Never   Smokeless tobacco: Never  Substance Use Topics   Alcohol use: No   Drug use: No    Family History  Problem Relation Age of Onset   Diabetes Mother        Type 2  Anemia Sister    Hypertension Sister    Anesthesia problems Neg Hx     No Known Allergies  Medication list has been reviewed and updated.  Current Outpatient Medications on File Prior to Visit  Medication Sig Dispense Refill   acetaminophen (TYLENOL) 500 MG tablet Take 1,000 mg by mouth every 6 (six) hours as needed for mild pain, moderate pain or headache.     acyclovir (ZOVIRAX) 200 MG capsule Take 2 capsules (400 mg total) by mouth 2 (two) times daily. 14 capsule 0   phentermine (ADIPEX-P) 37.5 MG tablet Take 1 tablet by mouth daily.     Semaglutide-Weight Management (WEGOVY) 0.25  MG/0.5ML SOAJ Inject 0.25 mg into the skin once a week. 2 mL 0   Vitamin D, Ergocalciferol, (DRISDOL) 1.25 MG (50000 UNIT) CAPS capsule Take 50,000 Units by mouth once a week.     No current facility-administered medications on file prior to visit.    Review of Systems:  As per HPI- otherwise negative.   Physical Examination: There were no vitals filed for this visit. There were no vitals filed for this visit. There is no height or weight on file to calculate BMI. Ideal Body Weight:    GEN: no acute distress. HEENT: Atraumatic, Normocephalic.  Ears and Nose: No external deformity. CV: RRR, No M/G/R. No JVD. No thrill. No extra heart sounds. PULM: CTA B, no wheezes, crackles, rhonchi. No retractions. No resp. distress. No accessory muscle use. ABD: S, NT, ND, +BS. No rebound. No HSM. EXTR: No c/c/e PSYCH: Normally interactive. Conversant.    Assessment and Plan: ***  Signed Lamar Blinks, MD

## 2022-04-07 ENCOUNTER — Ambulatory Visit: Payer: BC Managed Care – PPO | Admitting: Family Medicine

## 2022-04-07 NOTE — Progress Notes (Deleted)
[name] at [facility] [address], [place], [place] [address] [phone]  Date:  04/11/2022   Name:  Paula Benson   DOB:  03-28-1986   MRN:  [id number]  PCP:  Darreld Mclean, MD    Chief Complaint: No chief complaint on file.   History of Present Illness:  Paula Benson is a 36 y.o. 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   CAP (community acquired pneumonia) 11/02/2014   Pyelonephritis 10/31/2014   Hypokalemia 10/31/2014   Neck pain 02/05/2014   Migraine, unspecified, without mention of intractable migraine without mention of status migrainosus 08/14/2013   Palpitations 04/12/2012   Hx of preeclampsia, prior pregnancy, currently pregnant--had eclamptic seizure 2 weeks postpartum 02/20/2012   History of aspiration pneumonitis 02/20/2012   Iron deficiency anemia 02/20/2012   Recurrent urinary tract infection 08/25/2011   Genital herpes 06/09/2011   Panic disorder 03/21/2011   History of pulmonary embolus (PE) 03/17/2011   Chronic anticoagulation 03/17/2011   ASTHMA, INTERMITTENT 03/04/2010    Past Medical History:  Diagnosis Date   Anemia    Anxiety    Asthma    Bipolar II disorder ([place])    Complication of anesthesia 2003   ASPIRATED DURING EMERGENCY CS   Depression    doing ok   Eclamptic seizure 2003   Fibroid    Genital herpes    Headache(784.0)    Herpes    History of blood clots    x3   History of blood transfusion MARCH 2013    Menometrorrhagia 07/27/2016   Migraines    Personal history of PE (pulmonary embolism) x 3, latest 02/2010   noncompliant with INR checks, must limit refills   Preeclampsia 2003   with first pregnancy   Pregnancy induced hypertension 2003   seized 2wks after   Pyelonephritis 2012   Seizures ([place]) 2003   after first delivery   Suicidal ideations    Urinary tract infection     Past Surgical History:  Procedure Laterality Date   CESAREAN SECTION  12/2001 and 01/2008   CESAREAN SECTION  06/26/2012   Procedure: CESAREAN SECTION;  Surgeon: Lavonia Drafts, MD;  Location: [facility];  Service: Obstetrics;  Laterality: N/A;   INDUCED ABORTION  01/2010    Social History   Tobacco Use   Smoking status: Never   Smokeless tobacco: Never  Substance Use Topics   Alcohol use: No   Drug use: No    Family History  Problem Relation Age of Onset   Diabetes Mother        Type 2   Anemia Sister    Hypertension Sister    Breast cancer Maternal Aunt    Anesthesia problems Neg Hx     No Known Allergies  Medication list has been reviewed and updated.  Current Outpatient Medications on File Prior to Visit  Medication Sig Dispense Refill   acetaminophen (TYLENOL) 500 MG tablet Take 1,000 mg by mouth every 6 (  six) hours as needed for mild pain, moderate pain or headache.     acyclovir (ZOVIRAX) 200 MG capsule Take 2 capsules (400 mg total) by mouth 2 (two) times daily. 14 capsule 0   phentermine (ADIPEX-P) 37.5 MG tablet Take 1 tablet by mouth daily.     Semaglutide-Weight Management (WEGOVY) 0.25 MG/0.5ML SOAJ Inject 0.25 mg into the skin once a week. 2 mL 0   Vitamin D, Ergocalciferol, (DRISDOL) 1.25 MG (50000 UNIT) CAPS capsule Take 50,000 Units by mouth once a week.     No current facility-administered medications on file prior to visit.    Review of Systems:  As per HPI- otherwise negative.   Physical Examination: There were no vitals filed for this visit. There were no vitals  filed for this visit. There is no height or weight on file to calculate BMI. Ideal Body Weight:    GEN: no acute distress. HEENT: Atraumatic, Normocephalic.  Ears and Nose: No external deformity. CV: RRR, No M/G/R. No JVD. No thrill. No extra heart sounds. PULM: CTA B, no wheezes, crackles, rhonchi. No retractions. No resp. distress. No accessory muscle use. ABD: S, NT, ND, +BS. No rebound. No HSM. EXTR: No c/c/e PSYCH: Normally interactive. Conversant.    Assessment and Plan: ***  Signed Lamar Blinks, MD

## 2022-04-11 ENCOUNTER — Ambulatory Visit: Payer: BC Managed Care – PPO | Admitting: Family Medicine

## 2022-04-12 DIAGNOSIS — F4000 Agoraphobia, unspecified: Secondary | ICD-10-CM | POA: Diagnosis not present

## 2022-04-13 ENCOUNTER — Ambulatory Visit (INDEPENDENT_AMBULATORY_CARE_PROVIDER_SITE_OTHER): Payer: BC Managed Care – PPO | Admitting: Family Medicine

## 2022-04-13 VITALS — BP 122/74 | HR 87 | Temp 98.7°F | Resp 18 | Ht 63.0 in | Wt 193.8 lb

## 2022-04-13 DIAGNOSIS — L309 Dermatitis, unspecified: Secondary | ICD-10-CM | POA: Diagnosis not present

## 2022-04-13 DIAGNOSIS — F39 Unspecified mood [affective] disorder: Secondary | ICD-10-CM | POA: Diagnosis not present

## 2022-04-13 DIAGNOSIS — F429 Obsessive-compulsive disorder, unspecified: Secondary | ICD-10-CM

## 2022-04-13 DIAGNOSIS — R2231 Localized swelling, mass and lump, right upper limb: Secondary | ICD-10-CM | POA: Diagnosis not present

## 2022-04-13 MED ORDER — FLUOCINOLONE ACETONIDE SCALP 0.01 % EX OIL: TOPICAL_OIL | CUTANEOUS | 1 refills | Status: AC

## 2022-04-13 MED ORDER — TRIAMCINOLONE ACETONIDE 0.5 % EX OINT: 1.0000 "application " | TOPICAL_OINTMENT | Freq: Two times a day (BID) | CUTANEOUS | 1 refills | Status: DC

## 2022-04-13 NOTE — Patient Instructions (Addendum)
It was good to see you today-please let me know what medication you are taking so I can try to help you with your OCD sx and your sleep ? ?Try the scalp oil as needed for eczema on your scalp ?Can use the triamcinolone ointment as needed for skin symptoms  ?

## 2022-04-13 NOTE — Progress Notes (Signed)
Therapist, music at [facility] ?[address], [address] ?[place], [place] [address] ?336 [phone] ?Fax [phone] ? ?Date:  04/13/2022  ? ?Name:  Paula Benson   DOB:  May 15, 1986   MRN:  [id number] ? ?PCP:  Darreld Mclean, MD  ? ? ?Chief Complaint: medication office visit (Concerns/ questions Pt would like to follow up on her mammogram. 2. discuss Medications for OCD. 3. Insomnia- was prescribed Trazodone in the past, can not remember if she is taking this or not. 4. Triamcinolone 0.025 is no longer working. ) ? ? ?History of Present Illness: ? ?Paula Benson is a 36 y.o. very pleasant female patient who presents with the following: ? ?We saw her a month - she had a mass in her right axillary area, we did a mammo/ [place] which was ok - recommendation is for a mammo at age 36 ?We discussed this, she is feeling relieved ? ?She does have a history of mood disorder which has been treated by psychiatry in the past, but that she does not currently have a psychiatrist apparently. ?She does not know what she is taking right now as she does not have her meds on her  ?Pt states she was dx with OCD in 2010- she is seeing a therapist weekly, but he does not rx medications  ? ?She mentions-  ?Propranolol ?Fluoxetine ?Toperimate ? ?However, she is not sure exactly what she is taking.  She notes some difficulty with sleep. ? ?She also notes eczema in her scalp and in various patches on her skin, especially her feet ? ? ? ?Patient Active Problem List  ? Diagnosis Date Noted  ? Menometrorrhagia 07/27/2016  ? 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 CAP (community acquired pneumonia) 11/02/2014  ? Pyelonephritis 10/31/2014  ? Hypokalemia 10/31/2014  ? Neck pain 02/05/2014  ? Migraine, unspecified, without mention of intractable migraine without mention of status migrainosus 08/14/2013  ? Palpitations 04/12/2012  ? Hx of preeclampsia, prior pregnancy, currently pregnant--had eclamptic seizure 2 weeks  postpartum 02/20/2012  ? History of aspiration pneumonitis 02/20/2012  ? Iron deficiency anemia 02/20/2012  ? Recurrent urinary tract infection 08/25/2011  ? Genital herpes 06/09/2011  ? Panic disorder 03/21/2011  ? History of pulmonary embolus (PE) 03/17/2011  ? Chronic anticoagulation 03/17/2011  ? ASTHMA, INTERMITTENT 03/04/2010  ? ? ?Past Medical History:  ?Diagnosis Date  ? Anemia   ? Anxiety   ? Asthma   ? Bipolar II disorder ([place])   ? Complication of anesthesia 2003  ? ASPIRATED DURING EMERGENCY CS  ? Depression   ? doing ok  ? Eclamptic seizure 2003  ? Fibroid   ? Genital herpes   ? Headache(784.0)   ? Herpes   ? History of blood clots   ? x3  ? History of blood transfusion MARCH 2013  ? Menometrorrhagia 07/27/2016  ? Migraines   ? Personal history of PE (pulmonary embolism) x 3, latest 02/2010  ? noncompliant with INR checks, must limit refills  ? Preeclampsia 2003  ? with first pregnancy  ? Pregnancy induced hypertension 2003  ? seized 2wks after  ? Pyelonephritis 2012  ? Seizures ([place]) 2003  ? after first delivery  ? Suicidal ideations   ? Urinary tract infection   ? ? ?Past Surgical History:  ?Procedure Laterality Date  ? CESAREAN SECTION  12/2001 and 01/2008  ? CESAREAN SECTION  06/26/2012  ? Procedure: CESAREAN SECTION;  Surgeon: Lavonia Drafts, MD;  Location:  [facility];  Service: Obstetrics;  Laterality: N/A;  ? INDUCED ABORTION  01/2010  ? ? ?Social History  ? ?Tobacco Use  ? Smoking status: Never  ? Smokeless tobacco: Never  ?Substance Use Topics  ? Alcohol use: No  ? Drug use: No  ? ? ?Family History  ?Problem Relation Age of Onset  ? Diabetes Mother   ?     Type 2  ? Anemia Sister   ? Hypertension Sister   ? Breast cancer Maternal Aunt   ? Anesthesia problems Neg Hx   ? ? ?No Known Allergies ? ?Medication list has been reviewed and updated. ? ?Current Outpatient Medications on File Prior to Visit  ?Medication Sig Dispense Refill  ? acetaminophen (TYLENOL) 500 MG tablet Take 1,000 mg by mouth every  6 (six) hours as needed for mild pain, moderate pain or headache.    ? acyclovir (ZOVIRAX) 200 MG capsule Take 2 capsules (400 mg total) by mouth 2 (two) times daily. 14 capsule 0  ? phentermine (ADIPEX-P) 37.5 MG tablet Take 1 tablet by mouth daily. Pt takes this off and on.    ? Semaglutide-Weight Management (WEGOVY) 0.25 MG/0.5ML SOAJ Inject 0.25 mg into the skin once a week. (Patient taking differently: Inject 0.25 mg into the skin once a week. Has not started yet.) 2 mL 0  ? Vitamin D, Ergocalciferol, (DRISDOL) 1.25 MG (50000 UNIT) CAPS capsule Take 50,000 Units by mouth once a week.    ? ?No current facility-administered medications on file prior to visit.  ? ? ?Review of Systems: ? ?As per HPI- otherwise negative. ? ? ?Physical Examination: ?Vitals:  ? 04/13/22 0947  ?BP: 122/74  ?Pulse: 87  ?Resp: 18  ?Temp: 98.7 ?F (37.1 ?C)  ?SpO2: 97%  ? ?Vitals:  ? 04/13/22 0947  ?Weight: 193 lb 12.8 oz (87.9 kg)  ?Height: '5\' 3"'$  (1.6 m)  ? ?Body mass index is 34.33 kg/m?. ?Ideal Body Weight: Weight in (lb) to have BMI = 25: 140.8 ? ?GEN: no acute distress. Mildly obese, looks well  ?HEENT: Atraumatic, Normocephalic.  ?Ears and Nose: No external deformity. ?CV: RRR, No M/G/R. No JVD. No thrill. No extra heart sounds. ?PULM: CTA B, no wheezes, crackles, rhonchi. No retractions. No resp. distress. No accessory muscle use. ?EXTR: No c/c/e ?PSYCH: Normally interactive. Conversant.  ?Patch of eczema on the dorsum of right foot  ? ?Assessment and Plan: ?Axillary mass, right ? ?Mood disorder ([place]) ? ?Obsessive-compulsive disorder, unspecified type ? ?Eczema, unspecified type - Plan: Fluocinolone Acetonide Scalp 0.01 % OIL, triamcinolone ointment (KENALOG) 0.5 % ? ?We discussed her history of OCD symptoms and difficulty with sleep.  I asked her to please let me know what medication she is currently taking so I can try to help her.  She will send me a message when she gets home ? ?For eczema we will have her use a scalp oil and  triamcinolone ointment as needed ?Signed ?Lamar Blinks, MD ? ?

## 2022-04-15 ENCOUNTER — Telehealth: Payer: Self-pay

## 2022-04-15 ENCOUNTER — Encounter: Payer: Self-pay | Admitting: Family Medicine

## 2022-04-15 NOTE — Telephone Encounter (Signed)
Med list updated per pt recs. ?

## 2022-04-15 NOTE — Telephone Encounter (Signed)
Attachments have been printed and placed in the folder to be reviewed.  ?

## 2022-04-19 DIAGNOSIS — F4000 Agoraphobia, unspecified: Secondary | ICD-10-CM | POA: Diagnosis not present

## 2022-04-28 ENCOUNTER — Telehealth: Payer: Self-pay | Admitting: Adult Health

## 2022-04-28 NOTE — Telephone Encounter (Signed)
Rescheduled appointment per provider PAL. Unable to leave voicemail due to mailbox being full.

## 2022-04-30 ENCOUNTER — Encounter: Payer: Self-pay | Admitting: Family Medicine

## 2022-05-03 DIAGNOSIS — F4000 Agoraphobia, unspecified: Secondary | ICD-10-CM | POA: Diagnosis not present

## 2022-05-03 NOTE — Progress Notes (Unsigned)
[place] at [facility] [address] [place], [place] [address] [phone]  Date:  05/05/2022   Name:  Paula Benson   DOB:  Jun 04, 1986   MRN:  [id number]  PCP:  Darreld Mclean, MD    Chief Complaint: No chief complaint on file.   History of Present Illness:  Paula Benson is a 36 y.o. very pleasant female patient who presents with the following:  Patient seen today for concern of stomach discomfort Saw her most recently on 5/10 She sent the following message recently: I have been having a sharp pain similar to contractions but not in my uterus. more in the lower abdomen that lasts for about 30 seconds but happens about 3 times and goes away...but hurts extremely bad after I eat or drink for the last few days. I have noticed pain in my lower back also. I am going to schedule an appointment but wanted to make sure you were aware of it  Pap smear 2018, needs update She also contacted me with information on medicine she was taking previously-propranolol 10 mg and fluvoxamine ? 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[place]) 12/31/2015   CAP (community acquired pneumonia) 11/02/2014   Pyelonephritis 10/31/2014   Hypokalemia 10/31/2014   Neck pain 02/05/2014   Migraine, unspecified, without mention of intractable migraine without mention of status migrainosus 08/14/2013   Palpitations 04/12/2012   Hx of preeclampsia, prior pregnancy, currently pregnant--had eclamptic seizure 2 weeks postpartum 02/20/2012   History of aspiration pneumonitis 02/20/2012   Iron deficiency anemia 02/20/2012   Recurrent urinary tract infection 08/25/2011   Genital herpes 06/09/2011   Panic disorder 03/21/2011   History of pulmonary embolus (PE) 03/17/2011   Chronic anticoagulation 03/17/2011   ASTHMA, INTERMITTENT 03/04/2010    Past Medical History:  Diagnosis  Date   Anemia    Anxiety    Asthma    Bipolar II disorder ([place])    Complication of anesthesia 2003   ASPIRATED DURING EMERGENCY CS   Depression    doing ok   Eclamptic seizure 2003   Fibroid    Genital herpes    Headache(784.0)    Herpes    History of blood clots    x3   History of blood transfusion MARCH 2013   Menometrorrhagia 07/27/2016   Migraines    Personal history of PE (pulmonary embolism) x 3, latest 02/2010   noncompliant with INR checks, must limit refills   Preeclampsia 2003   with first pregnancy   Pregnancy induced hypertension 2003   seized 2wks after   Pyelonephritis 2012   Seizures ([place]) 2003   after first delivery   Suicidal ideations    Urinary tract infection     Past Surgical History:  Procedure Laterality Date   CESAREAN SECTION  12/2001 and 01/2008   CESAREAN SECTION  06/26/2012   Procedure: CESAREAN SECTION;  Surgeon: Lavonia Drafts, MD;  Location: [facility];  Service: Obstetrics;  Laterality: N/A;   INDUCED ABORTION  01/2010    Social History   Tobacco Use   Smoking status: Never   Smokeless tobacco: Never  Substance Use Topics   Alcohol use: No   Drug use: No    Family History  Problem Relation Age of Onset   Diabetes Mother        Type 2   Anemia Sister  Hypertension Sister    Breast cancer Maternal Aunt    Anesthesia problems Neg Hx     No Known Allergies  Medication list has been reviewed and updated.  Current Outpatient Medications on File Prior to Visit  Medication Sig Dispense Refill   acetaminophen (TYLENOL) 500 MG tablet Take 1,000 mg by mouth every 6 (six) hours as needed for mild pain, moderate pain or headache.     acyclovir (ZOVIRAX) 200 MG capsule Take 2 capsules (400 mg total) by mouth 2 (two) times daily. 14 capsule 0   Fluocinolone Acetonide Scalp 0.01 % OIL Apply to scalp daily as needed, wash out 8- 12 hours later 118 mL 1   fluvoxaMINE (LUVOX) 25 MG tablet Take 1 tablet (25 mg total) by mouth at  bedtime.     phentermine (ADIPEX-P) 37.5 MG tablet Take 1 tablet by mouth daily. Pt takes this off and on.     propranolol (INDERAL) 10 MG tablet Take 1 tablet (10 mg total) by mouth 3 (three) times daily.     Semaglutide-Weight Management (WEGOVY) 0.25 MG/0.5ML SOAJ Inject 0.25 mg into the skin once a week. (Patient taking differently: Inject 0.25 mg into the skin once a week. Has not started yet.) 2 mL 0   triamcinolone ointment (KENALOG) 0.5 % Apply 1 application. topically 2 (two) times daily. Use as needed for eczema, do not use on face 60 g 1   Vitamin D, Ergocalciferol, (DRISDOL) 1.25 MG (50000 UNIT) CAPS capsule Take 50,000 Units by mouth once a week.     No current facility-administered medications on file prior to visit.    Review of Systems:  As per HPI- otherwise negative.   Physical Examination: There were no vitals filed for this visit. There were no vitals filed for this visit. There is no height or weight on file to calculate BMI. Ideal Body Weight:    GEN: no acute distress. HEENT: Atraumatic, Normocephalic.  Ears and Nose: No external deformity. CV: RRR, No M/G/R. No JVD. No thrill. No extra heart sounds. PULM: CTA B, no wheezes, crackles, rhonchi. No retractions. No resp. distress. No accessory muscle use. ABD: S, NT, ND, +BS. No rebound. No HSM. EXTR: No c/c/e PSYCH: Normally interactive. Conversant.    Assessment and Plan: ***  Signed Lamar Blinks, MD

## 2022-05-05 ENCOUNTER — Ambulatory Visit (INDEPENDENT_AMBULATORY_CARE_PROVIDER_SITE_OTHER): Payer: BC Managed Care – PPO | Admitting: Family Medicine

## 2022-05-05 ENCOUNTER — Encounter: Payer: Self-pay | Admitting: Family Medicine

## 2022-05-05 ENCOUNTER — Other Ambulatory Visit (HOSPITAL_COMMUNITY)
Admission: RE | Admit: 2022-05-05 | Discharge: 2022-05-05 | Disposition: A | Discharge status: Home / Self Care | Payer: BC Managed Care – PPO | Source: Ambulatory Visit | Attending: Family Medicine | Admitting: Family Medicine

## 2022-05-05 VITALS — BP 124/78 | HR 100 | Temp 97.7°F | Resp 18 | Ht 62.0 in | Wt 200.4 lb

## 2022-05-05 DIAGNOSIS — R109 Unspecified abdominal pain: Secondary | ICD-10-CM

## 2022-05-05 DIAGNOSIS — Z124 Encounter for screening for malignant neoplasm of cervix: Secondary | ICD-10-CM | POA: Diagnosis not present

## 2022-05-05 DIAGNOSIS — F5101 Primary insomnia: Secondary | ICD-10-CM | POA: Diagnosis not present

## 2022-05-05 DIAGNOSIS — R1031 Right lower quadrant pain: Secondary | ICD-10-CM

## 2022-05-05 DIAGNOSIS — A5901 Trichomonal vulvovaginitis: Secondary | ICD-10-CM | POA: Diagnosis not present

## 2022-05-05 DIAGNOSIS — R350 Frequency of micturition: Secondary | ICD-10-CM | POA: Diagnosis not present

## 2022-05-05 DIAGNOSIS — F39 Unspecified mood [affective] disorder: Secondary | ICD-10-CM

## 2022-05-05 LAB — POCT URINALYSIS DIP (MANUAL ENTRY)
Bilirubin, UA: NEGATIVE
Blood, UA: NEGATIVE
Glucose, UA: NEGATIVE mg/dL
Ketones, POC UA: NEGATIVE mg/dL
Leukocytes, UA: NEGATIVE
Nitrite, UA: NEGATIVE
Protein Ur, POC: NEGATIVE mg/dL
Spec Grav, UA: 1.025 (ref 1.010–1.025)
Urobilinogen, UA: 0.2 E.U./dL
pH, UA: 5 (ref 5.0–8.0)

## 2022-05-05 LAB — POCT URINE PREGNANCY: Preg Test, Ur: NEGATIVE

## 2022-05-05 MED ORDER — ESZOPICLONE 2 MG PO TABS: 1.0000 mg | ORAL_TABLET | Freq: Every evening | ORAL | 0 refills | Status: DC | PRN

## 2022-05-05 NOTE — Patient Instructions (Signed)
It was good to see you today, I will be in touch with your labs as soon as possible We can set up a CT scan to evaluate your abdominal pain, I will get this ordered For insomnia, please try Lunesta half tablet first at bedtime, continue the whole tablet if needed Do not combine with alcohol or any other sedating medication Please let me know how this works for you! Lets get you seen by the psychiatrist of your choice for mood symptoms

## 2022-05-06 LAB — COMPREHENSIVE METABOLIC PANEL
ALT: 14 U/L (ref 0–35)
AST: 15 U/L (ref 0–37)
Albumin: 3.9 g/dL (ref 3.5–5.2)
Alkaline Phosphatase: 54 U/L (ref 39–117)
BUN: 14 mg/dL (ref 6–23)
CO2: 25 mEq/L (ref 19–32)
Calcium: 9.4 mg/dL (ref 8.4–10.5)
Chloride: 103 mEq/L (ref 96–112)
Creatinine, Ser: 0.93 mg/dL (ref 0.40–1.20)
GFR: 78.04 mL/min (ref >60.00)
Glucose, Bld: 82 mg/dL (ref 70–99)
Potassium: 4.1 mEq/L (ref 3.5–5.1)
Sodium: 136 mEq/L (ref 135–145)
Total Bilirubin: 0.3 mg/dL (ref 0.2–1.2)
Total Protein: 7.3 g/dL (ref 6.0–8.3)

## 2022-05-06 LAB — CBC
HCT: 35.3 % — ABNORMAL LOW (ref 36.0–46.0)
Hemoglobin: 11.4 g/dL — ABNORMAL LOW (ref 12.0–15.0)
MCHC: 32.4 g/dL (ref 30.0–36.0)
MCV: 82.1 fl (ref 78.0–100.0)
Platelets: 406 10*3/uL — ABNORMAL HIGH (ref 150.0–400.0)
RBC: 4.3 Mil/uL (ref 3.87–5.11)
RDW: 26.6 % — ABNORMAL HIGH (ref 11.5–15.5)
WBC: 6.4 10*3/uL (ref 4.0–10.5)

## 2022-05-06 LAB — URINE CULTURE
MICRO NUMBER:: 13470612
SPECIMEN QUALITY:: ADEQUATE

## 2022-05-06 NOTE — Addendum Note (Signed)
Addended by: Lamar Blinks C on: 05/06/2022 07:27 AM   Modules accepted: Orders

## 2022-05-09 ENCOUNTER — Inpatient Hospital Stay: Payer: BC Managed Care – PPO

## 2022-05-10 ENCOUNTER — Encounter: Payer: Self-pay | Admitting: Family Medicine

## 2022-05-10 ENCOUNTER — Other Ambulatory Visit: Payer: Self-pay | Admitting: Family Medicine

## 2022-05-10 DIAGNOSIS — A5909 Other urogenital trichomoniasis: Secondary | ICD-10-CM

## 2022-05-10 DIAGNOSIS — F4000 Agoraphobia, unspecified: Secondary | ICD-10-CM | POA: Diagnosis not present

## 2022-05-10 LAB — CYTOLOGY - PAP
Adequacy: ABSENT
Chlamydia: NEGATIVE
Comment: NEGATIVE
Comment: NEGATIVE
Comment: NORMAL
Diagnosis: NEGATIVE
High risk HPV: NEGATIVE
Neisseria Gonorrhea: NEGATIVE

## 2022-05-10 MED ORDER — METRONIDAZOLE 500 MG PO TABS: 500.0000 mg | ORAL_TABLET | Freq: Two times a day (BID) | ORAL | 0 refills | Status: DC

## 2022-05-11 ENCOUNTER — Other Ambulatory Visit: Payer: Self-pay | Admitting: *Deleted

## 2022-05-11 DIAGNOSIS — D509 Iron deficiency anemia, unspecified: Secondary | ICD-10-CM

## 2022-05-12 ENCOUNTER — Encounter: Payer: Self-pay | Admitting: Family Medicine

## 2022-05-12 ENCOUNTER — Inpatient Hospital Stay: Payer: BC Managed Care – PPO | Attending: Hematology and Oncology

## 2022-05-13 ENCOUNTER — Inpatient Hospital Stay: Payer: BC Managed Care – PPO | Admitting: Adult Health

## 2022-05-16 ENCOUNTER — Ambulatory Visit (HOSPITAL_BASED_OUTPATIENT_CLINIC_OR_DEPARTMENT_OTHER): Payer: BC Managed Care – PPO

## 2022-05-17 ENCOUNTER — Inpatient Hospital Stay: Payer: BC Managed Care – PPO | Admitting: Adult Health

## 2022-05-17 DIAGNOSIS — F4000 Agoraphobia, unspecified: Secondary | ICD-10-CM | POA: Diagnosis not present

## 2022-05-17 NOTE — Progress Notes (Unsigned)
[place] at [facility] [address] [place], [place] [address] [phone]  Date:  05/18/2022   Name:  Paula Benson   DOB:  Apr 25, 1986   MRN:  [id number]  PCP:  Darreld Mclean, MD    Chief Complaint: No chief complaint on file.   History of Present Illness:  Paula Benson is a 36 y.o. very pleasant female patient who presents with the following:  Here today to discuss concerns about employment and anxiety/ OCD/ depression Virtual visit today.  Connected with patient via MyChart video.  Patient identity confirmed with 2 factors, the patient and myself are present on the visit today I last saw her on 6/1 when she had concern of various physical complaints, most specifically abd pain.  We ordered a CT scan but it still has not been completed-there was a lengthy delay to get her scan approved through her insurance Labs from her last visit as below- mild anemia, trich which has been treated   At this time her abd pain is less frequent but still there at times She will get sharp pains in her right side still  She plans to get her CT done on Friday-I encouraged her to go ahead and get this done  She having a lot of trouble with depression and anxiety She notes she has suffered with this for years- will wax and wane  Dx with OCD in 2011 Around this time last year she had a flare up of her OCD and panic, her therapist took her out of work for a time.  She got better and was able to go back to work, but since then her symptoms have flared again Her sx have gotten worse since the pandemic She had to get an iron infusion due to severe anemia in March of this year This experience seemed to worsen her sx -she noted more panic and anxiety  She has been seeing a psychiatrist virtually over the last few months- through "[facility]"- she is not sure the name of the practice She is working on getting in with a live psychiatrist I gave her  a lunesta rx as she has not been sleeping- she has tried up to 2 mg so far, just used it a couple of times so far. Unfortunately it did not really help so far I recommended that she try taking 3 mg tonight and please let me know if this is helpful I advised her unfortunately I am uncomfortable treating her with SSRI/SNRI due to history of possible bipolar disorder.  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  She does not wish to go up on the dose right now Results for orders placed or performed in visit on 05/05/22  Urine Culture   Specimen: Urine  Result Value Ref Range   MICRO NUMBER: [id number]    SPECIMEN QUALITY: Adequate    Sample Source URINE    STATUS: FINAL  ISOLATE 1:      Mixed genital flora isolated. These superficial bacteria are not indicative of a urinary tract infection. No further organism identification is warranted on this specimen. If clinically indicated, recollect clean-catch, mid-stream urine and transfer  immediately to Urine Culture Transport Tube.   CBC  Result Value Ref Range   WBC 6.4 4.0 - 10.5 K/uL   RBC 4.30 3.87 - 5.11 Mil/uL   Platelets 406.0 (H) 150.0 - 400.0 K/uL   Hemoglobin 11.4 (L) 12.0 - 15.0 g/dL   HCT 35.3 (L) 36.0 - 46.0 %   MCV 82.1 78.0 - 100.0 fl   MCHC 32.4 30.0 - 36.0 g/dL   RDW 26.6 (H) 11.5 - 15.5 %  Comprehensive metabolic panel  Result Value Ref Range   Sodium 136 135 - 145 mEq/L   Potassium 4.1 3.5 - 5.1 mEq/L   Chloride 103 96 - 112 mEq/L   CO2 25 19 - 32 mEq/L   Glucose,  Bld 82 70 - 99 mg/dL   BUN 14 6 - 23 mg/dL   Creatinine, Ser 0.93 0.40 - 1.20 mg/dL   Total Bilirubin 0.3 0.2 - 1.2 mg/dL   Alkaline Phosphatase 54 39 - 117 U/L   AST 15 0 - 37 U/L   ALT 14 0 - 35 U/L   Total Protein 7.3 6.0 - 8.3 g/dL   Albumin 3.9 3.5 - 5.2 g/dL   GFR 78.04 >60.00 mL/min   Calcium 9.4 8.4 - 10.5 mg/dL  POCT urine pregnancy  Result Value Ref Range   Preg Test, Ur Negative Negative  POCT urinalysis dipstick  Result Value Ref Range   Color, UA yellow yellow   Clarity, UA cloudy (A) clear   Glucose, UA negative negative mg/dL   Bilirubin, UA negative negative   Ketones, POC UA negative negative mg/dL   Spec Grav, UA 1.025 1.010 - 1.025   Blood, UA negative negative   pH, UA 5.0 5.0 - 8.0   Protein Ur, POC negative negative mg/dL   Urobilinogen, UA 0.2 0.2 or 1.0 E.U./dL   Nitrite, UA Negative Negative   Leukocytes, UA Negative Negative  Cytology - PAP  Result Value Ref Range   High risk HPV Negative    Neisseria Gonorrhea Negative    Chlamydia Negative    Adequacy      Satisfactory for evaluation; transformation zone component ABSENT.   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   CAP (community acquired pneumonia) 11/02/2014   Pyelonephritis 10/31/2014   Hypokalemia 10/31/2014   Neck pain 02/05/2014   Migraine, unspecified, without mention of intractable migraine without mention of status migrainosus 08/14/2013   Palpitations 04/12/2012   Hx of preeclampsia, prior pregnancy, currently pregnant--had eclamptic seizure 2 weeks postpartum 02/20/2012   History of  aspiration pneumonitis 02/20/2012   Iron deficiency anemia 02/20/2012   Recurrent urinary tract infection 08/25/2011   Genital herpes 06/09/2011   Panic disorder 03/21/2011   History of pulmonary embolus (PE) 03/17/2011   Chronic anticoagulation 03/17/2011   ASTHMA, INTERMITTENT 03/04/2010    Past Medical History:  Diagnosis Date   Anemia    Anxiety    Asthma    Bipolar II disorder ([place])    Complication of anesthesia 2003   ASPIRATED DURING EMERGENCY CS   Depression    doing ok   Eclamptic seizure 2003   Fibroid    Genital herpes    Headache(784.0)    Herpes    History of blood clots    x3   History of blood transfusion MARCH 2013   Menometrorrhagia 07/27/2016   Migraines    Personal history of PE (pulmonary embolism) x 3, latest 02/2010   noncompliant with INR checks, must limit refills   Preeclampsia 2003   with first pregnancy   Pregnancy induced hypertension 2003   seized 2wks after   Pyelonephritis 2012   Seizures ([place]) 2003   after first delivery   Suicidal ideations    Urinary tract infection     Past Surgical History:  Procedure Laterality Date   CESAREAN SECTION  12/2001 and 01/2008   CESAREAN SECTION  06/26/2012   Procedure: CESAREAN SECTION;  Surgeon: Lavonia Drafts, MD;  Location: [facility];  Service: Obstetrics;  Laterality: N/A;   INDUCED ABORTION  01/2010    Social History   Tobacco Use   Smoking status: Never   Smokeless tobacco: Never  Substance Use Topics   Alcohol use: No   Drug use: No    Family History  Problem Relation Age of Onset   Diabetes Mother        Type 2   Anemia Sister    Hypertension Sister    Breast cancer Maternal Aunt    Anesthesia problems Neg Hx     No Known Allergies  Medication list has been reviewed and updated.  Current Outpatient Medications on File Prior to Visit  Medication Sig Dispense Refill   acetaminophen (TYLENOL) 500 MG tablet Take 1,000 mg by mouth every 6 (six) hours as needed for mild  pain, moderate pain or headache.     acyclovir (ZOVIRAX) 200 MG capsule Take 2 capsules (400 mg total) by mouth 2 (two) times daily. 14 capsule 0   eszopiclone (LUNESTA) 2 MG TABS tablet Take 0.5-1 tablets (1-2 mg total) by mouth at bedtime as needed for sleep. Take immediately before bedtime 30 tablet 0   Fluocinolone Acetonide Scalp 0.01 % OIL Apply to scalp daily as needed, wash out 8- 12 hours later 118 mL 1   fluvoxaMINE (LUVOX) 25 MG tablet Take 1 tablet (25 mg total) by mouth at bedtime.     metroNIDAZOLE (FLAGYL) 500 MG tablet Take 1 tablet (500 mg total) by mouth 2 (two) times daily. Take 1 pill twice daily for one week. NO alcohol 14 tablet 0   phentermine (ADIPEX-P) 37.5 MG tablet Take 1 tablet by mouth daily. Pt takes this off and on.     propranolol (INDERAL) 10 MG tablet Take 1 tablet (10 mg total) by mouth 3 (three) times daily.     Semaglutide-Weight Management (WEGOVY) 0.25 MG/0.5ML SOAJ Inject 0.25 mg into the skin once a week. (Patient taking differently: Inject 0.25 mg into the skin once a week. Has not started yet.) 2 mL 0   triamcinolone ointment (KENALOG) 0.5 % Apply 1 application. topically 2 (two) times daily. Use as needed for eczema, do not use on face 60 g 1   Vitamin D, Ergocalciferol, (DRISDOL) 1.25 MG (50000 UNIT) CAPS capsule Take 50,000 Units by mouth once a week.     No current facility-administered  medications on file prior to visit.    Review of Systems:  As per HPI- otherwise negative.   Physical Examination: There were no vitals filed for this visit. There were no vitals filed for this visit. There is no height or weight on file to calculate BMI. Ideal Body Weight:  Patient observed via video monitor.  She looks well, her normal self   Assessment and Plan: Primary insomnia  Obesity (BMI 30.0-34.9) - Plan: Semaglutide-Weight Management (WEGOVY) 0.25 MG/0.5ML SOAJ  Obsessive-compulsive disorder, unspecified type  Mood disorder ([place]) Virtual  visit today to discuss symptoms of OCD, depression, mood disorder, insomnia and anxiety.  I will complete FMLA for patient, which will give her time to establish care with a psychiatrist and begin treatment. We will have her try taking 3 mg of Lunesta this evening as 1, 2 mg has not been effective I asked her to let me know if I can do anything else to be of assistance in the meantime Refilled Wegovy today  Signed Lamar Blinks, MD  Patient followed up with me with this update:  I was able to get an appt scheduled with:  [facility] Address: [address], [place], [place] [address] Phone: [phone] The appointment is 06/02/2022 at 10:00 am. I just wanted to let you know. Thank you again.

## 2022-05-18 ENCOUNTER — Encounter: Payer: Self-pay | Admitting: Family Medicine

## 2022-05-18 ENCOUNTER — Telehealth (INDEPENDENT_AMBULATORY_CARE_PROVIDER_SITE_OTHER): Payer: BC Managed Care – PPO | Admitting: Family Medicine

## 2022-05-18 DIAGNOSIS — F5101 Primary insomnia: Secondary | ICD-10-CM | POA: Diagnosis not present

## 2022-05-18 DIAGNOSIS — F39 Unspecified mood [affective] disorder: Secondary | ICD-10-CM | POA: Diagnosis not present

## 2022-05-18 DIAGNOSIS — E669 Obesity, unspecified: Secondary | ICD-10-CM | POA: Diagnosis not present

## 2022-05-18 DIAGNOSIS — F429 Obsessive-compulsive disorder, unspecified: Secondary | ICD-10-CM

## 2022-05-18 MED ORDER — WEGOVY 0.25 MG/0.5ML ~~LOC~~ SOAJ: 0.2500 mg | SUBCUTANEOUS | 3 refills | Status: DC

## 2022-05-20 ENCOUNTER — Ambulatory Visit (HOSPITAL_BASED_OUTPATIENT_CLINIC_OR_DEPARTMENT_OTHER): Payer: BC Managed Care – PPO

## 2022-05-20 ENCOUNTER — Encounter: Payer: Self-pay | Admitting: Family Medicine

## 2022-05-23 ENCOUNTER — Ambulatory Visit: Payer: BC Managed Care – PPO | Admitting: Family Medicine

## 2022-05-23 ENCOUNTER — Ambulatory Visit (HOSPITAL_BASED_OUTPATIENT_CLINIC_OR_DEPARTMENT_OTHER): Payer: BC Managed Care – PPO

## 2022-05-23 ENCOUNTER — Telehealth (HOSPITAL_BASED_OUTPATIENT_CLINIC_OR_DEPARTMENT_OTHER): Payer: Self-pay

## 2022-05-25 DIAGNOSIS — F4000 Agoraphobia, unspecified: Secondary | ICD-10-CM | POA: Diagnosis not present

## 2022-05-31 DIAGNOSIS — F4000 Agoraphobia, unspecified: Secondary | ICD-10-CM | POA: Diagnosis not present

## 2022-06-02 ENCOUNTER — Encounter: Payer: Self-pay | Admitting: Family Medicine

## 2022-06-02 DIAGNOSIS — F40298 Other specified phobia: Secondary | ICD-10-CM | POA: Diagnosis not present

## 2022-06-02 DIAGNOSIS — F3132 Bipolar disorder, current episode depressed, moderate: Secondary | ICD-10-CM | POA: Diagnosis not present

## 2022-06-02 DIAGNOSIS — F429 Obsessive-compulsive disorder, unspecified: Secondary | ICD-10-CM | POA: Diagnosis not present

## 2022-06-13 DIAGNOSIS — F4000 Agoraphobia, unspecified: Secondary | ICD-10-CM | POA: Diagnosis not present

## 2022-06-14 NOTE — Progress Notes (Unsigned)
[place] at [facility] [address] [place], [place] [address] [phone]  Date:  06/15/2022   Name:  Paula Benson   DOB:  [birth year]   MRN:  [id number]  PCP:  Darreld Mclean, MD    Chief Complaint: No chief complaint on file.   History of Present Illness:  Paula Benson is a 36 y.o. very pleasant female patient who presents with the following:  Patient seen today for follow-up-virtual visit  Patient location is home, my location is office.  Patient identity confirmed with 2 factors, she gives consent for virtual visit today.  The patient and myself are present on the call Most recent visit with myself was a virtual visit about 1 month ago-she was having trouble with depression, anxiety, OCD.  She is now seeing a psychiatrist which has been helpful for her  At her last visit I was also given her Johnnye Sima for persistent insomnia She saw her psychiatrist today- they increased her buspar and her aricept;  getting her mood under control has been a challenge. She is also using hydroxyzine- this helps with her sleep but does leave her a bit hung over the next day  She has also been taking Wegovy, 0.25 mg for appetite control- she has run out of it however and cannot find this med at any local pharmacy  We discussed trying a different option, we think her insurance will also cover [place].  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[place]) 12/31/2015   CAP (community acquired pneumonia) 11/02/2014   Pyelonephritis 10/31/2014   Hypokalemia 10/31/2014   Neck pain 02/05/2014   Migraine, unspecified, without mention of intractable migraine without mention of status migrainosus 08/14/2013    Palpitations 04/12/2012   Hx of preeclampsia, prior pregnancy, currently pregnant--had eclamptic seizure 2 weeks postpartum 02/20/2012   History of aspiration pneumonitis 02/20/2012   Iron deficiency anemia 02/20/2012   Recurrent urinary tract infection 08/25/2011   Genital herpes 06/09/2011   Panic disorder 03/21/2011   History of pulmonary embolus (PE) 03/17/2011   Chronic anticoagulation 03/17/2011   ASTHMA, INTERMITTENT 03/04/2010    Past Medical History:  Diagnosis Date   Anemia    Anxiety    Asthma    Bipolar II disorder ([place])    Complication of anesthesia 2003   ASPIRATED DURING EMERGENCY CS   Depression    doing ok   Eclamptic seizure 2003   Fibroid    Genital herpes    Headache(784.0)    Herpes    History of blood clots    x3   History of blood transfusion MARCH 2013   Menometrorrhagia 07/27/2016   Migraines    Personal history of PE (pulmonary embolism) x 3, latest 02/2010   noncompliant with INR checks, must limit refills   Preeclampsia 2003   with first pregnancy   Pregnancy induced hypertension 2003   seized 2wks after   Pyelonephritis 2012   Seizures ([place]) 2003   after first delivery   Suicidal ideations    Urinary tract infection     Past Surgical History:  Procedure Laterality Date   CESAREAN SECTION  12/2001 and 01/2008   CESAREAN SECTION  06/26/2012   Procedure:  CESAREAN SECTION;  Surgeon: Lavonia Drafts, MD;  Location: [facility];  Service: Obstetrics;  Laterality: N/A;   INDUCED ABORTION  01/2010    Social History   Tobacco Use   Smoking status: Never   Smokeless tobacco: Never  Substance Use Topics   Alcohol use: No   Drug use: No    Family History  Problem Relation Age of Onset   Diabetes Mother        Type 2   Anemia Sister    Hypertension Sister    Breast cancer Maternal Aunt    Anesthesia problems Neg Hx     No Known Allergies  Medication list has been reviewed and updated.  Current Outpatient Medications on File  Prior to Visit  Medication Sig Dispense Refill   acetaminophen (TYLENOL) 500 MG tablet Take 1,000 mg by mouth every 6 (six) hours as needed for mild pain, moderate pain or headache.     acyclovir (ZOVIRAX) 200 MG capsule Take 2 capsules (400 mg total) by mouth 2 (two) times daily. 14 capsule 0   eszopiclone (LUNESTA) 2 MG TABS tablet Take 0.5-1 tablets (1-2 mg total) by mouth at bedtime as needed for sleep. Take immediately before bedtime 30 tablet 0   Fluocinolone Acetonide Scalp 0.01 % OIL Apply to scalp daily as needed, wash out 8- 12 hours later 118 mL 1   fluvoxaMINE (LUVOX) 25 MG tablet Take 1 tablet (25 mg total) by mouth at bedtime.     metroNIDAZOLE (FLAGYL) 500 MG tablet Take 1 tablet (500 mg total) by mouth 2 (two) times daily. Take 1 pill twice daily for one week. NO alcohol 14 tablet 0   propranolol (INDERAL) 10 MG tablet Take 1 tablet (10 mg total) by mouth 3 (three) times daily.     Semaglutide-Weight Management (WEGOVY) 0.25 MG/0.5ML SOAJ Inject 0.25 mg into the skin once a week. 2 mL 3   triamcinolone ointment (KENALOG) 0.5 % Apply 1 application. topically 2 (two) times daily. Use as needed for eczema, do not use on face 60 g 1   Vitamin D, Ergocalciferol, (DRISDOL) 1.25 MG (50000 UNIT) CAPS capsule Take 50,000 Units by mouth once a week.     No current facility-administered medications on file prior to visit.    Review of Systems:  As per HPI- otherwise negative.   Physical Examination: There were no vitals filed for this visit. There were no vitals filed for this visit. There is no height or weight on file to calculate BMI. Ideal Body Weight:   Video visit today -observe patient over video monitor.  She looks well, normal self   Assessment and Plan: Obesity (BMI 30.0-34.9) - Plan: Semaglutide,0.25 or 0.'5MG'$ /DOS, (OZEMPIC, 0.25 OR 0.5 MG/DOSE,) 2 MG/3ML SOPN  Mild intermittent reactive airway disease without complication - Plan: albuterol (VENTOLIN HFA) 108 (90 Base)  MCG/ACT inhaler  Virtual visit today for follow-up.  Mood disorder is being managed by psychiatry, she is making progress although still in the process of adjusting medications Offered support and encouragement  Refilled albuterol to use as needed for wheezing  Send a prescription for Ozempic instead of Wegovy  Signed Lamar Blinks, MD

## 2022-06-15 ENCOUNTER — Telehealth (INDEPENDENT_AMBULATORY_CARE_PROVIDER_SITE_OTHER): Payer: BC Managed Care – PPO | Admitting: Family Medicine

## 2022-06-15 ENCOUNTER — Encounter: Payer: Self-pay | Admitting: Family Medicine

## 2022-06-15 DIAGNOSIS — E669 Obesity, unspecified: Secondary | ICD-10-CM | POA: Diagnosis not present

## 2022-06-15 DIAGNOSIS — J4520 Mild intermittent asthma, uncomplicated: Secondary | ICD-10-CM

## 2022-06-15 DIAGNOSIS — F40298 Other specified phobia: Secondary | ICD-10-CM | POA: Diagnosis not present

## 2022-06-15 DIAGNOSIS — F429 Obsessive-compulsive disorder, unspecified: Secondary | ICD-10-CM | POA: Diagnosis not present

## 2022-06-15 DIAGNOSIS — F3132 Bipolar disorder, current episode depressed, moderate: Secondary | ICD-10-CM | POA: Diagnosis not present

## 2022-06-15 MED ORDER — OZEMPIC (0.25 OR 0.5 MG/DOSE) 2 MG/3ML ~~LOC~~ SOPN: 0.2500 mg | PEN_INJECTOR | SUBCUTANEOUS | 2 refills | Status: DC

## 2022-06-15 MED ORDER — ALBUTEROL SULFATE HFA 108 (90 BASE) MCG/ACT IN AERS: 2.0000 | INHALATION_SPRAY | Freq: Four times a day (QID) | RESPIRATORY_TRACT | 6 refills | Status: AC | PRN

## 2022-06-15 NOTE — Telephone Encounter (Signed)
Okay for VV.

## 2022-06-28 DIAGNOSIS — F4000 Agoraphobia, unspecified: Secondary | ICD-10-CM | POA: Diagnosis not present

## 2022-06-30 ENCOUNTER — Encounter: Payer: Self-pay | Admitting: Family Medicine

## 2022-06-30 DIAGNOSIS — F429 Obsessive-compulsive disorder, unspecified: Secondary | ICD-10-CM | POA: Diagnosis not present

## 2022-06-30 DIAGNOSIS — F3132 Bipolar disorder, current episode depressed, moderate: Secondary | ICD-10-CM | POA: Diagnosis not present

## 2022-06-30 DIAGNOSIS — F40298 Other specified phobia: Secondary | ICD-10-CM | POA: Diagnosis not present

## 2022-07-05 DIAGNOSIS — F4000 Agoraphobia, unspecified: Secondary | ICD-10-CM | POA: Diagnosis not present

## 2022-07-06 ENCOUNTER — Encounter: Payer: Self-pay | Admitting: Family Medicine

## 2022-07-09 ENCOUNTER — Encounter: Payer: Self-pay | Admitting: Family Medicine

## 2022-07-09 MED ORDER — FLUCONAZOLE 150 MG PO TABS: 150.0000 mg | ORAL_TABLET | Freq: Once | ORAL | 0 refills | Status: AC

## 2022-07-12 DIAGNOSIS — F4000 Agoraphobia, unspecified: Secondary | ICD-10-CM | POA: Diagnosis not present

## 2022-07-12 DIAGNOSIS — F3132 Bipolar disorder, current episode depressed, moderate: Secondary | ICD-10-CM | POA: Diagnosis not present

## 2022-07-12 DIAGNOSIS — F429 Obsessive-compulsive disorder, unspecified: Secondary | ICD-10-CM | POA: Diagnosis not present

## 2022-07-12 DIAGNOSIS — F40298 Other specified phobia: Secondary | ICD-10-CM | POA: Diagnosis not present

## 2022-07-19 ENCOUNTER — Encounter: Payer: Self-pay | Admitting: Family Medicine

## 2022-07-19 DIAGNOSIS — F429 Obsessive-compulsive disorder, unspecified: Secondary | ICD-10-CM | POA: Diagnosis not present

## 2022-07-19 DIAGNOSIS — F40298 Other specified phobia: Secondary | ICD-10-CM | POA: Diagnosis not present

## 2022-07-19 DIAGNOSIS — F3132 Bipolar disorder, current episode depressed, moderate: Secondary | ICD-10-CM | POA: Diagnosis not present

## 2022-07-19 DIAGNOSIS — F4000 Agoraphobia, unspecified: Secondary | ICD-10-CM | POA: Diagnosis not present

## 2022-07-20 NOTE — Progress Notes (Deleted)
[place] at [facility] [address], [place], [place] [address] [phone]  Date:  07/25/2022   Name:  Paula Benson   DOB:  1986/03/07   MRN:  [id number]  PCP:  Darreld Mclean, MD    Chief Complaint: No chief complaint on file.   History of Present Illness:  Paula Benson is a 36 y.o. very pleasant female patient who presents with the following:  [name] is seen today for follow-up Most recent visit with myself was a virtual visit in July; she had recently been started on Wegovy but it was in short supply.  We tried to change her over to Ozempic for weight control  She is also seeing a psychiatrist for depression, anxiety, OCD She sent me a recent MyChart update:  Good Morning Dr Lorelei Pont, I just left my appt with the psychiatrist and have had a really hard time the last few days with the Depressive- Mania. So she prescribed Latuda '20mg'$  taken once a day. She also did a genesight testing to determine medications that will work best for me but will take 2 weeks to come back.  I am also concerned about my weight and energy. She mentioned asking you about checking my hormones to see what could be causing that. I'm not sure if anything like that exists but just told me to ask.  Lastly, is it possible that you provide a letter that has a specific date (can be updated at a later time). I am in limbo and may go into long term disability but nonetheless need a date. I also made an appt with you to do a checkup. Thank you for your time.  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[place]) 12/31/2015   CAP (community acquired pneumonia) 11/02/2014   Pyelonephritis 10/31/2014   Hypokalemia 10/31/2014   Neck pain 02/05/2014   Migraine, unspecified, without mention of intractable migraine without mention of status migrainosus 08/14/2013   Palpitations 04/12/2012   Hx  of preeclampsia, prior pregnancy, currently pregnant--had eclamptic seizure 2 weeks postpartum 02/20/2012   History of aspiration pneumonitis 02/20/2012   Iron deficiency anemia 02/20/2012   Recurrent urinary tract infection 08/25/2011   Genital herpes 06/09/2011   Panic disorder 03/21/2011   History of pulmonary embolus (PE) 03/17/2011   Chronic anticoagulation 03/17/2011   ASTHMA, INTERMITTENT 03/04/2010    Past Medical History:  Diagnosis Date   Anemia    Anxiety    Asthma    Bipolar II disorder ([place])    Complication of anesthesia 2003   ASPIRATED DURING EMERGENCY CS   Depression    doing ok   Eclamptic seizure 2003   Fibroid    Genital herpes    Headache(784.0)    Herpes    History of blood clots    x3   History of blood transfusion MARCH 2013   Menometrorrhagia 07/27/2016   Migraines    Personal history of PE (pulmonary embolism) x 3, latest 02/2010   noncompliant with INR checks, must limit refills   Preeclampsia 2003   with first pregnancy   Pregnancy induced hypertension 2003   seized 2wks after   Pyelonephritis 2012   Seizures ([place]) 2003   after first delivery   Suicidal ideations    Urinary tract infection     Past Surgical History:  Procedure Laterality Date   CESAREAN SECTION  12/2001 and  01/2008   CESAREAN SECTION  06/26/2012   Procedure: CESAREAN SECTION;  Surgeon: Lavonia Drafts, MD;  Location: [facility];  Service: Obstetrics;  Laterality: N/A;   INDUCED ABORTION  01/2010    Social History   Tobacco Use   Smoking status: Never   Smokeless tobacco: Never  Substance Use Topics   Alcohol use: No   Drug use: No    Family History  Problem Relation Age of Onset   Diabetes Mother        Type 2   Anemia Sister    Hypertension Sister    Breast cancer Maternal Aunt    Anesthesia problems Neg Hx     No Known Allergies  Medication list has been reviewed and updated.  Current Outpatient Medications on File Prior to Visit  Medication Sig  Dispense Refill   acetaminophen (TYLENOL) 500 MG tablet Take 1,000 mg by mouth every 6 (six) hours as needed for mild pain, moderate pain or headache.     acyclovir (ZOVIRAX) 200 MG capsule Take 2 capsules (400 mg total) by mouth 2 (two) times daily. 14 capsule 0   albuterol (VENTOLIN HFA) 108 (90 Base) MCG/ACT inhaler Inhale 2 puffs into the lungs every 6 (six) hours as needed for wheezing or shortness of breath. 1 each 6   eszopiclone (LUNESTA) 2 MG TABS tablet Take 0.5-1 tablets (1-2 mg total) by mouth at bedtime as needed for sleep. Take immediately before bedtime 30 tablet 0   Fluocinolone Acetonide Scalp 0.01 % OIL Apply to scalp daily as needed, wash out 8- 12 hours later 118 mL 1   fluvoxaMINE (LUVOX) 25 MG tablet Take 1 tablet (25 mg total) by mouth at bedtime.     propranolol (INDERAL) 10 MG tablet Take 1 tablet (10 mg total) by mouth 3 (three) times daily.     Semaglutide,0.25 or 0.'5MG'$ /DOS, (OZEMPIC, 0.25 OR 0.5 MG/DOSE,) 2 MG/3ML SOPN Inject 0.25 mg into the skin once a week. Increase to 0.5 mg weekly after one month 3 mL 2   triamcinolone ointment (KENALOG) 0.5 % Apply 1 application. topically 2 (two) times daily. Use as needed for eczema, do not use on face 60 g 1   Vitamin D, Ergocalciferol, (DRISDOL) 1.25 MG (50000 UNIT) CAPS capsule Take 50,000 Units by mouth once a week.     No current facility-administered medications on file prior to visit.    Review of Systems:  As per HPI- otherwise negative.   Physical Examination: There were no vitals filed for this visit. There were no vitals filed for this visit. There is no height or weight on file to calculate BMI. Ideal Body Weight:    ***  Assessment and Plan: ***  Signed Lamar Blinks, MD

## 2022-07-25 ENCOUNTER — Ambulatory Visit: Payer: BC Managed Care – PPO | Admitting: Family Medicine

## 2022-07-26 NOTE — Progress Notes (Unsigned)
[place] at [facility] [address] [place], [place] [address] [phone]  Date:  07/28/2022   Name:  Paula Benson   DOB:  [birth year]   MRN:  [id number]  PCP:  Darreld Mclean, MD    Chief Complaint: No chief complaint on file.   History of Present Illness:  Paula Benson is a 36 y.o. very pleasant female patient who presents with the following:  Following up today- last visit with myself was a virtual visit last month to discuss her progress with her psychiatrist, ozempic for weight loss, and leave from work for mood disorder   Virtual visit- pt location is home and my location is office She had planned to come in but had a problem with her transportation so we changed to a virtual visit Pt and myself are present on the call today  She wants to talk about her RTW plan She did have the genesight testing done for med guidance but results are not back yet. Her psychiatrist has her on xanax and Latuda 20 mg daily - bipolar disorder dx per pt  She is seeing her psychiatrist every 2 weeks and therapy weekly   She is trying to figure out when she might go back to work; she is not sure yet  They plan that she will work from home for the foreseeable future Going into the office gives her sx of agoraphobia and social anxiety  However she has been able to successfully work from home until recently when her mood disorder became worse  We don't have a definite RTW date yet- we can go with 08/19/22 as a starting point for now     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[place]) 12/31/2015   CAP (community acquired pneumonia) 11/02/2014   Pyelonephritis 10/31/2014   Hypokalemia 10/31/2014   Neck pain 02/05/2014   Migraine, unspecified, without mention of intractable migraine without mention of status migrainosus 08/14/2013   Palpitations 04/12/2012   Hx  of preeclampsia, prior pregnancy, currently pregnant--had eclamptic seizure 2 weeks postpartum 02/20/2012   History of aspiration pneumonitis 02/20/2012   Iron deficiency anemia 02/20/2012   Recurrent urinary tract infection 08/25/2011   Genital herpes 06/09/2011   Panic disorder 03/21/2011   History of pulmonary embolus (PE) 03/17/2011   Chronic anticoagulation 03/17/2011   ASTHMA, INTERMITTENT 03/04/2010    Past Medical History:  Diagnosis Date   Anemia    Anxiety    Asthma    Bipolar II disorder ([place])    Complication of anesthesia 2003   ASPIRATED DURING EMERGENCY CS   Depression    doing ok   Eclamptic seizure 2003   Fibroid    Genital herpes    Headache(784.0)    Herpes    History of blood clots    x3   History of blood transfusion MARCH 2013   Menometrorrhagia 07/27/2016   Migraines    Personal history of PE (pulmonary embolism) x 3, latest 02/2010   noncompliant with INR checks, must limit refills   Preeclampsia 2003   with first pregnancy   Pregnancy induced hypertension 2003   seized 2wks after   Pyelonephritis 2012   Seizures ([place]) 2003   after first delivery   Suicidal ideations    Urinary tract infection     Past Surgical History:  Procedure Laterality Date   CESAREAN SECTION  12/2001  and 01/2008   CESAREAN SECTION  06/26/2012   Procedure: CESAREAN SECTION;  Surgeon: Lavonia Drafts, MD;  Location: [facility];  Service: Obstetrics;  Laterality: N/A;   INDUCED ABORTION  01/2010    Social History   Tobacco Use   Smoking status: Never   Smokeless tobacco: Never  Substance Use Topics   Alcohol use: No   Drug use: No    Family History  Problem Relation Age of Onset   Diabetes Mother        Type 2   Anemia Sister    Hypertension Sister    Breast cancer Maternal Aunt    Anesthesia problems Neg Hx     No Known Allergies  Medication list has been reviewed and updated.  Current Outpatient Medications on File Prior to Visit  Medication Sig  Dispense Refill   acetaminophen (TYLENOL) 500 MG tablet Take 1,000 mg by mouth every 6 (six) hours as needed for mild pain, moderate pain or headache.     acyclovir (ZOVIRAX) 200 MG capsule Take 2 capsules (400 mg total) by mouth 2 (two) times daily. 14 capsule 0   albuterol (VENTOLIN HFA) 108 (90 Base) MCG/ACT inhaler Inhale 2 puffs into the lungs every 6 (six) hours as needed for wheezing or shortness of breath. 1 each 6   eszopiclone (LUNESTA) 2 MG TABS tablet Take 0.5-1 tablets (1-2 mg total) by mouth at bedtime as needed for sleep. Take immediately before bedtime 30 tablet 0   Fluocinolone Acetonide Scalp 0.01 % OIL Apply to scalp daily as needed, wash out 8- 12 hours later 118 mL 1   fluvoxaMINE (LUVOX) 25 MG tablet Take 1 tablet (25 mg total) by mouth at bedtime.     propranolol (INDERAL) 10 MG tablet Take 1 tablet (10 mg total) by mouth 3 (three) times daily.     Semaglutide,0.25 or 0.'5MG'$ /DOS, (OZEMPIC, 0.25 OR 0.5 MG/DOSE,) 2 MG/3ML SOPN Inject 0.25 mg into the skin once a week. Increase to 0.5 mg weekly after one month 3 mL 2   triamcinolone ointment (KENALOG) 0.5 % Apply 1 application. topically 2 (two) times daily. Use as needed for eczema, do not use on face 60 g 1   Vitamin D, Ergocalciferol, (DRISDOL) 1.25 MG (50000 UNIT) CAPS capsule Take 50,000 Units by mouth once a week.     No current facility-administered medications on file prior to visit.    Review of Systems:  As per HPI- otherwise negative.   Physical Examination: There were no vitals filed for this visit. There were no vitals filed for this visit. There is no height or weight on file to calculate BMI. Ideal Body Weight:   Pt observed via mychart video- she looks well, her normal self    Assessment and Plan: Bipolar affective disorder, current episode mixed, current episode severity unspecified ([place])  Iron deficiency - Plan: Ferritin, CBC  Screening for thyroid disorder - Plan: TSH  Virtual visit today to  discuss mood disorder. Annalese is being treated by a psychiatrist and therapist, she is making progress and seems to be on very appropriate medications.  I offered encouragement, we will keep an eye out for paperwork from her job She would like to check her iron, thyroid, CBC-placed lab orders that she can do at her convenience  Signed Lamar Blinks, MD

## 2022-07-28 ENCOUNTER — Telehealth (INDEPENDENT_AMBULATORY_CARE_PROVIDER_SITE_OTHER): Payer: BC Managed Care – PPO | Admitting: Family Medicine

## 2022-07-28 DIAGNOSIS — E611 Iron deficiency: Secondary | ICD-10-CM

## 2022-07-28 DIAGNOSIS — F3160 Bipolar disorder, current episode mixed, unspecified: Secondary | ICD-10-CM

## 2022-07-28 DIAGNOSIS — Z1329 Encounter for screening for other suspected endocrine disorder: Secondary | ICD-10-CM | POA: Diagnosis not present

## 2022-08-01 ENCOUNTER — Other Ambulatory Visit: Payer: BC Managed Care – PPO

## 2022-08-02 DIAGNOSIS — F429 Obsessive-compulsive disorder, unspecified: Secondary | ICD-10-CM | POA: Diagnosis not present

## 2022-08-02 DIAGNOSIS — F40298 Other specified phobia: Secondary | ICD-10-CM | POA: Diagnosis not present

## 2022-08-02 DIAGNOSIS — F3132 Bipolar disorder, current episode depressed, moderate: Secondary | ICD-10-CM | POA: Diagnosis not present

## 2022-08-03 ENCOUNTER — Other Ambulatory Visit: Payer: BC Managed Care – PPO

## 2022-08-09 DIAGNOSIS — F4000 Agoraphobia, unspecified: Secondary | ICD-10-CM | POA: Diagnosis not present

## 2022-08-14 ENCOUNTER — Encounter: Payer: Self-pay | Admitting: Family Medicine

## 2022-08-15 DIAGNOSIS — F3132 Bipolar disorder, current episode depressed, moderate: Secondary | ICD-10-CM | POA: Diagnosis not present

## 2022-08-15 DIAGNOSIS — F429 Obsessive-compulsive disorder, unspecified: Secondary | ICD-10-CM | POA: Diagnosis not present

## 2022-08-15 DIAGNOSIS — F40298 Other specified phobia: Secondary | ICD-10-CM | POA: Diagnosis not present

## 2022-08-16 NOTE — Progress Notes (Deleted)
[place] at [facility] [address], [place], [place] [address] [phone]  Date:  08/17/2022   Name:  Paula Benson   DOB:  1986/07/15   MRN:  [id number]  PCP:  Darreld Mclean, MD    Chief Complaint: No chief complaint on file.   History of Present Illness:  Paula Benson is a 36 y.o. 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[place]) 12/31/2015   CAP (community acquired pneumonia) 11/02/2014   Pyelonephritis 10/31/2014   Hypokalemia 10/31/2014   Neck pain 02/05/2014   Migraine, unspecified, without mention of intractable migraine without mention of status migrainosus 08/14/2013   Palpitations 04/12/2012   Hx of preeclampsia, prior pregnancy, currently pregnant--had eclamptic seizure 2 weeks postpartum 02/20/2012   History of aspiration pneumonitis 02/20/2012   Iron deficiency anemia 02/20/2012   Recurrent urinary tract infection 08/25/2011   Genital herpes 06/09/2011   Panic disorder 03/21/2011   History of pulmonary embolus (PE) 03/17/2011   Chronic anticoagulation 03/17/2011   ASTHMA, INTERMITTENT 03/04/2010    Past Medical History:  Diagnosis Date   Anemia    Anxiety    Asthma    Bipolar II disorder ([place])    Complication of anesthesia 2003   ASPIRATED DURING EMERGENCY CS   Depression    doing ok   Eclamptic seizure 2003   Fibroid    Genital herpes    Headache(784.0)    Herpes    History of blood clots    x3   History of blood transfusion MARCH 2013   Menometrorrhagia 07/27/2016   Migraines    Personal history of PE (pulmonary  embolism) x 3, latest 02/2010   noncompliant with INR checks, must limit refills   Preeclampsia 2003   with first pregnancy   Pregnancy induced hypertension 2003   seized 2wks after   Pyelonephritis 2012   Seizures ([place]) 2003   after first delivery   Suicidal ideations    Urinary tract infection     Past Surgical History:  Procedure Laterality Date   CESAREAN SECTION  12/2001 and 01/2008   CESAREAN SECTION  06/26/2012   Procedure: CESAREAN SECTION;  Surgeon: Lavonia Drafts, MD;  Location: [facility];  Service: Obstetrics;  Laterality: N/A;   INDUCED ABORTION  01/2010    Social History   Tobacco Use   Smoking status: Never   Smokeless tobacco: Never  Substance Use Topics   Alcohol use: No   Drug use: No    Family History  Problem Relation Age of Onset   Diabetes Mother        Type 2   Anemia Sister    Hypertension Sister    Breast cancer Maternal Aunt    Anesthesia problems Neg Hx     No Known Allergies  Medication list has been reviewed and updated.  Current Outpatient Medications on File Prior to Visit  Medication Sig Dispense Refill   acetaminophen (TYLENOL) 500 MG tablet Take 1,000 mg by mouth every 6 (six) hours as needed for mild pain, moderate pain or headache.  acyclovir (ZOVIRAX) 200 MG capsule Take 2 capsules (400 mg total) by mouth 2 (two) times daily. 14 capsule 0   albuterol (VENTOLIN HFA) 108 (90 Base) MCG/ACT inhaler Inhale 2 puffs into the lungs every 6 (six) hours as needed for wheezing or shortness of breath. 1 each 6   ALPRAZolam (XANAX) 0.25 MG tablet Take 1 tablet (0.25 mg total) by mouth 3 (three) times daily as needed for anxiety. 20 tablet 0   ARIPiprazole (ABILIFY) 15 MG tablet Take 1 tablet (15 mg total) by mouth daily. 30 tablet 0   busPIRone (BUSPAR) 10 MG tablet Take 1 tablet (10 mg total) by mouth 2 (two) times daily. 60 tablet 0   Fluocinolone Acetonide Scalp 0.01 % OIL Apply to scalp daily as needed, wash out 8- 12 hours later 118  mL 1   lurasidone (LATUDA) 20 MG TABS tablet Take 1 tablet (20 mg total) by mouth daily. 30 tablet 0   Semaglutide,0.25 or 0.'5MG'$ /DOS, (OZEMPIC, 0.25 OR 0.5 MG/DOSE,) 2 MG/3ML SOPN Inject 0.25 mg into the skin once a week. Increase to 0.5 mg weekly after one month 3 mL 2   triamcinolone ointment (KENALOG) 0.5 % Apply 1 application. topically 2 (two) times daily. Use as needed for eczema, do not use on face 60 g 1   Vitamin D, Ergocalciferol, (DRISDOL) 1.25 MG (50000 UNIT) CAPS capsule Take 50,000 Units by mouth once a week.     No current facility-administered medications on file prior to visit.    Review of Systems:  As per HPI- otherwise negative.   Physical Examination: There were no vitals filed for this visit. There were no vitals filed for this visit. There is no height or weight on file to calculate BMI. Ideal Body Weight:    ***  Assessment and Plan: ***  Signed Lamar Blinks, MD

## 2022-08-17 ENCOUNTER — Ambulatory Visit: Payer: BC Managed Care – PPO | Admitting: Family Medicine

## 2022-08-22 ENCOUNTER — Telehealth (INDEPENDENT_AMBULATORY_CARE_PROVIDER_SITE_OTHER): Payer: BC Managed Care – PPO | Admitting: Family Medicine

## 2022-08-22 DIAGNOSIS — R519 Headache, unspecified: Secondary | ICD-10-CM | POA: Diagnosis not present

## 2022-08-22 NOTE — Progress Notes (Signed)
[place] at [facility] [address] [place],  [address] [phone]  Date:  08/22/2022   Name:  Paula Benson   DOB:  May 04, 1986   MRN:  [id number]  PCP:  Darreld Mclean, MD    Chief Complaint: Follow up/ weight   History of Present Illness:  Paula Benson is a 36 y.o. very pleasant female patient who presents with the following:  Virtual visit today for follow-up  Pt location is home, my location is office  Patient identity confirmed with 2 factors, she gives consent for virtual visit today.  The patient and myself are present on the call today  Last seen by myself in August 8/24-  Today she notes brief episodes of head pain that will come and go rapidly.  The pain is generally located in the anterior left side of her head, may radiate to the left back She has noted this over the last week- was more pronounced 2 days ago The pain may be severe, may last for 2- 10 seconds. Never seems to last longer than this She is otherwise feeling ok Her vision is normal/baseline She describes the pain as "a brain freeze" feeling such as 1 might experience after drinking a milkshake This does not seem like a migraine to her- no nausea, etc that she might normally have with a migraine No injury to her head The pains seem to be occurring every 15- 20 minutes; will come and go in sets  The pain is not lasting all day  No concern of pregnancy  She is on ozempic 0.25; she has been on this for about 2 months now, she notes she is not really losing weight  We discussed that she likely would benefit from increasing her dose, she will try the 0.5 mg and let me know how she does  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[place]) 12/31/2015   CAP (community acquired pneumonia) 11/02/2014   Pyelonephritis 10/31/2014   Hypokalemia 10/31/2014   Neck pain 02/05/2014    Migraine, unspecified, without mention of intractable migraine without mention of status migrainosus 08/14/2013   Palpitations 04/12/2012   Hx of preeclampsia, prior pregnancy, currently pregnant--had eclamptic seizure 2 weeks postpartum 02/20/2012   History of aspiration pneumonitis 02/20/2012   Iron deficiency anemia 02/20/2012   Recurrent urinary tract infection 08/25/2011   Genital herpes 06/09/2011   Panic disorder 03/21/2011   History of pulmonary embolus (PE) 03/17/2011   Chronic anticoagulation 03/17/2011   ASTHMA, INTERMITTENT 03/04/2010    Past Medical History:  Diagnosis Date   Anemia    Anxiety    Asthma    Bipolar II disorder ([place])    Complication of anesthesia 2003   ASPIRATED DURING EMERGENCY CS   Depression    doing ok   Eclamptic seizure 2003   Fibroid    Genital herpes    Headache(784.0)    Herpes    History of blood clots    x3   History of blood transfusion MARCH 2013   Menometrorrhagia 07/27/2016   Migraines    Personal history of PE (pulmonary embolism) x 3, latest 02/2010   noncompliant with INR checks, must limit refills   Preeclampsia 2003   with first pregnancy   Pregnancy induced hypertension 2003   seized 2wks after   Pyelonephritis 2012   Seizures ([place]) 2003   after first  delivery   Suicidal ideations    Urinary tract infection     Past Surgical History:  Procedure Laterality Date   CESAREAN SECTION  12/2001 and 01/2008   CESAREAN SECTION  06/26/2012   Procedure: CESAREAN SECTION;  Surgeon: Lavonia Drafts, MD;  Location: [facility];  Service: Obstetrics;  Laterality: N/A;   INDUCED ABORTION  01/2010    Social History   Tobacco Use   Smoking status: Never   Smokeless tobacco: Never  Substance Use Topics   Alcohol use: No   Drug use: No    Family History  Problem Relation Age of Onset   Diabetes Mother        Type 2   Anemia Sister    Hypertension Sister    Breast cancer Maternal Aunt    Anesthesia problems Neg Hx      No Known Allergies  Medication list has been reviewed and updated.  Current Outpatient Medications on File Prior to Visit  Medication Sig Dispense Refill   acetaminophen (TYLENOL) 500 MG tablet Take 1,000 mg by mouth every 6 (six) hours as needed for mild pain, moderate pain or headache.     acyclovir (ZOVIRAX) 200 MG capsule Take 2 capsules (400 mg total) by mouth 2 (two) times daily. 14 capsule 0   albuterol (VENTOLIN HFA) 108 (90 Base) MCG/ACT inhaler Inhale 2 puffs into the lungs every 6 (six) hours as needed for wheezing or shortness of breath. 1 each 6   ALPRAZolam (XANAX) 0.25 MG tablet Take 1 tablet (0.25 mg total) by mouth 3 (three) times daily as needed for anxiety. 20 tablet 0   ARIPiprazole (ABILIFY) 15 MG tablet Take 1 tablet (15 mg total) by mouth daily. 30 tablet 0   busPIRone (BUSPAR) 10 MG tablet Take 1 tablet (10 mg total) by mouth 2 (two) times daily. 60 tablet 0   Fluocinolone Acetonide Scalp 0.01 % OIL Apply to scalp daily as needed, wash out 8- 12 hours later 118 mL 1   lurasidone (LATUDA) 20 MG TABS tablet Take 1 tablet (20 mg total) by mouth daily. 30 tablet 0   Semaglutide,0.25 or 0.'5MG'$ /DOS, (OZEMPIC, 0.25 OR 0.5 MG/DOSE,) 2 MG/3ML SOPN Inject 0.25 mg into the skin once a week. Increase to 0.5 mg weekly after one month 3 mL 2   triamcinolone ointment (KENALOG) 0.5 % Apply 1 application. topically 2 (two) times daily. Use as needed for eczema, do not use on face 60 g 1   Vitamin D, Ergocalciferol, (DRISDOL) 1.25 MG (50000 UNIT) CAPS capsule Take 50,000 Units by mouth once a week.     No current facility-administered medications on file prior to visit.    Review of Systems:  As per HPI- otherwise negative. Patient denies any other neurologic symptoms at this time  Physical Examination: There were no vitals filed for this visit. There were no vitals filed for this visit. There is no height or weight on file to calculate BMI. Ideal Body Weight:    Patient  observed via MyChart video.  She looks well, no distress is noted Not checking vital signs at home  Assessment and Plan: Headache, worsening - Plan: MR Brain W Wo Contrast, MR ANGIO HEAD WO W CONTRAST  Virtual visit today to discuss change in headache pattern as described above.  Patient has not been seen in person today but denies any other neurologic deficit.  I advised her evaluation for new headache can be complex.  If she would like, we can certainly get  a CT of her head easily today.  However, MRI is likely the more ideal test but may be more difficult to schedule.  For the time being she prefers to move forward getting MRI which I will order today.  She is advised if her symptoms are changing or getting worse she should go immediately to the emergency department for evaluation and treatment  She also notes she has not yet returned to work from her mood disorder symptoms, she is transitioning more to long-term disability and may have some additional paperwork for me to complete  Signed Lamar Blinks, MD

## 2022-08-24 ENCOUNTER — Ambulatory Visit: Payer: BC Managed Care – PPO | Admitting: Family Medicine

## 2022-08-25 ENCOUNTER — Encounter: Payer: Self-pay | Admitting: Family Medicine

## 2022-08-25 ENCOUNTER — Ambulatory Visit: Payer: BC Managed Care – PPO | Admitting: Family Medicine

## 2022-08-25 DIAGNOSIS — I771 Stricture of artery: Secondary | ICD-10-CM

## 2022-08-29 DIAGNOSIS — F3132 Bipolar disorder, current episode depressed, moderate: Secondary | ICD-10-CM | POA: Diagnosis not present

## 2022-08-29 DIAGNOSIS — F429 Obsessive-compulsive disorder, unspecified: Secondary | ICD-10-CM | POA: Diagnosis not present

## 2022-08-29 DIAGNOSIS — F40298 Other specified phobia: Secondary | ICD-10-CM | POA: Diagnosis not present

## 2022-08-31 ENCOUNTER — Telehealth: Payer: Self-pay

## 2022-08-31 NOTE — Telephone Encounter (Signed)
FYI:   Approval number: [id number] Dates approved: 08/23/22 - 10/22/22

## 2022-09-09 ENCOUNTER — Ambulatory Visit (HOSPITAL_COMMUNITY): Admission: RE | Admit: 2022-09-09 | Payer: BC Managed Care – PPO | Source: Ambulatory Visit

## 2022-09-09 ENCOUNTER — Ambulatory Visit (HOSPITAL_COMMUNITY)
Admission: RE | Admit: 2022-09-09 | Discharge: 2022-09-09 | Disposition: A | Discharge status: Home / Self Care | Payer: BC Managed Care – PPO | Source: Ambulatory Visit | Attending: Family Medicine | Admitting: Family Medicine

## 2022-09-09 ENCOUNTER — Other Ambulatory Visit: Payer: Self-pay | Admitting: Family Medicine

## 2022-09-09 DIAGNOSIS — R519 Headache, unspecified: Secondary | ICD-10-CM

## 2022-09-09 MED ORDER — GADOPICLENOL 0.5 MMOL/ML IV SOLN
9.0000 mL | Freq: Once | INTRAVENOUS | Status: AC | PRN
  Administered 2022-09-09: 9 mL via INTRAVENOUS

## 2022-09-09 NOTE — Addendum Note (Signed)
Addended by: Lamar Blinks C on: 09/09/2022 03:58 PM   Modules accepted: Orders

## 2022-09-12 ENCOUNTER — Encounter: Payer: Self-pay | Admitting: Family Medicine

## 2022-09-12 ENCOUNTER — Ambulatory Visit (INDEPENDENT_AMBULATORY_CARE_PROVIDER_SITE_OTHER): Payer: BC Managed Care – PPO | Admitting: Family Medicine

## 2022-09-12 ENCOUNTER — Other Ambulatory Visit: Payer: Self-pay | Admitting: Family Medicine

## 2022-09-12 VITALS — BP 124/72 | HR 93 | Temp 98.0°F | Resp 16 | Ht 63.0 in | Wt 202.4 lb

## 2022-09-12 DIAGNOSIS — E669 Obesity, unspecified: Secondary | ICD-10-CM

## 2022-09-12 DIAGNOSIS — L309 Dermatitis, unspecified: Secondary | ICD-10-CM

## 2022-09-12 DIAGNOSIS — R898 Other abnormal findings in specimens from other organs, systems and tissues: Secondary | ICD-10-CM | POA: Diagnosis not present

## 2022-09-12 DIAGNOSIS — G4452 New daily persistent headache (NDPH): Secondary | ICD-10-CM

## 2022-09-12 MED ORDER — NURTEC 75 MG PO TBDP: ORAL_TABLET | ORAL | 2 refills | Status: DC

## 2022-09-12 NOTE — Progress Notes (Signed)
[facility] [address], [place], [place] [address] [phone]  Date:  09/12/2022   Name:  Paula Benson   DOB:  December 31, 1985   MRN:  [id number]  PCP:  Darreld Mclean, MD    Chief Complaint: Headache (Here for headaches )   History of Present Illness:  Paula Benson is a 36 y.o. very pleasant female patient who presents with the following:  Patient seen today for concern of headache Most recent visit with myself was a virtual visit September 18 I ordered an MRI/MRA of her head at that time but these were actually just completed last week on 10/6-we went over results again today.  Advised that I have gotten in touch with her hematologist about the abnormal marrow signal, and also placed a neurosurgery referral  IMPRESSION: MRI brain: 1. Unremarkable MRI appearance of the brain. No evidence of acute intracranial abnormality. 2. Abnormal T1 hypointense marrow signal within the calvarium and visualized upper cervical spine. While this finding can reflect a marrow infiltrative process, the most common causes include chronic anemia, smoking and obesity. MRA head: 1. No intracranial aneurysm is identified. 2. 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  Today Ashe notes worsening of her headaches It feels like "sharp pains on one side." Mostly on the left but sometimes on the right  It feels "like a brain freeze" to her   About a month ago she screamed while at a cheerleading event with her daughter- this triggered a severe pain for about 2 minutes. 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[place]) 12/31/2015   CAP (community acquired pneumonia) 11/02/2014   Pyelonephritis 10/31/2014   Hypokalemia 10/31/2014   Neck pain 02/05/2014   Migraine, unspecified, without mention of intractable migraine without mention of status migrainosus 08/14/2013   Palpitations 04/12/2012   Hx of preeclampsia, prior pregnancy, currently pregnant--had eclamptic seizure 2 weeks postpartum 02/20/2012   History of aspiration pneumonitis 02/20/2012   Iron deficiency anemia 02/20/2012   Recurrent urinary tract infection 08/25/2011   Genital herpes 06/09/2011   Panic disorder 03/21/2011   History of pulmonary embolus (PE) 03/17/2011   Chronic anticoagulation 03/17/2011   ASTHMA, INTERMITTENT 03/04/2010    Past Medical History:  Diagnosis Date   Anemia    Anxiety    Asthma    Bipolar II disorder ([place])    Complication of anesthesia 2003   ASPIRATED DURING EMERGENCY CS   Depression    doing ok   Eclamptic seizure 2003   Fibroid    Genital herpes    Headache(784.0)    Herpes    History of blood clots    x3   History of blood transfusion MARCH 2013   Menometrorrhagia 07/27/2016   Migraines    Personal history of PE (pulmonary embolism) x 3, latest 02/2010   noncompliant with INR checks, must limit refills   Preeclampsia  2003   with first pregnancy   Pregnancy induced hypertension 2003   seized 2wks after   Pyelonephritis 2012   Seizures ([place]) 2003   after first delivery   Suicidal ideations    Urinary tract infection     Past Surgical History:  Procedure Laterality Date   CESAREAN SECTION  12/2001 and 01/2008   CESAREAN SECTION  06/26/2012   Procedure: CESAREAN SECTION;  Surgeon: Lavonia Drafts, MD;  Location: [facility];  Service: Obstetrics;  Laterality: N/A;   INDUCED ABORTION  01/2010    Social History    Tobacco Use   Smoking status: Never   Smokeless tobacco: Never  Substance Use Topics   Alcohol use: No   Drug use: No    Family History  Problem Relation Age of Onset   Diabetes Mother        Type 2   Anemia Sister    Hypertension Sister    Breast cancer Maternal Aunt    Anesthesia problems Neg Hx     No Known Allergies  Medication list has been reviewed and updated.  Current Outpatient Medications on File Prior to Visit  Medication Sig Dispense Refill   acetaminophen (TYLENOL) 500 MG tablet Take 1,000 mg by mouth every 6 (six) hours as needed for mild pain, moderate pain or headache.     acyclovir (ZOVIRAX) 200 MG capsule Take 2 capsules (400 mg total) by mouth 2 (two) times daily. 14 capsule 0   albuterol (VENTOLIN HFA) 108 (90 Base) MCG/ACT inhaler Inhale 2 puffs into the lungs every 6 (six) hours as needed for wheezing or shortness of breath. 1 each 6   ALPRAZolam (XANAX) 0.25 MG tablet Take 1 tablet (0.25 mg total) by mouth 3 (three) times daily as needed for anxiety. 20 tablet 0   ARIPiprazole (ABILIFY) 15 MG tablet Take 1 tablet (15 mg total) by mouth daily. 30 tablet 0   busPIRone (BUSPAR) 10 MG tablet Take 1 tablet (10 mg total) by mouth 2 (two) times daily. 60 tablet 0   Fluocinolone Acetonide Scalp 0.01 % OIL Apply to scalp daily as needed, wash out 8- 12 hours later 118 mL 1   lurasidone (LATUDA) 20 MG TABS tablet Take 1 tablet (20 mg total) by mouth daily. 30 tablet 0   Semaglutide,0.25 or 0.'5MG'$ /DOS, (OZEMPIC, 0.25 OR 0.5 MG/DOSE,) 2 MG/3ML SOPN Inject 0.5 mg into the skin once a week. 3 mL 1   triamcinolone ointment (KENALOG) 0.5 % Apply 1 application. topically 2 (two) times daily. Use as needed for eczema, do not use on face 60 g 1   Vitamin D, Ergocalciferol, (DRISDOL) 1.25 MG (50000 UNIT) CAPS capsule Take 50,000 Units by mouth once a week.     No current facility-administered medications on file prior to visit.    Review of Systems:  As per HPI-  otherwise negative.   Physical Examination: Vitals:   09/12/22 1450  BP: 124/72  Pulse: 93  Resp: 16  Temp: 98 F (36.7 C)  SpO2: 98%   Vitals:   09/12/22 1450  Weight: 202 lb 6.4 oz (91.8 kg)  Height: '5\' 3"'$  (1.6 m)   Body mass index is 35.85 kg/m. Ideal Body Weight: Weight in (lb) to have BMI = 25: 140.8  GEN: no acute distress.  Mildly obese, looks well HEENT: Atraumatic, Normocephalic. Bilateral TM wnl, oropharynx normal.  PEERL,EOMI.   Ears and Nose: No external deformity. CV: RRR, No M/G/R. No JVD. No thrill. No extra heart sounds. PULM:  CTA B, no wheezes, crackles, rhonchi. No retractions. No resp. distress. No accessory muscle use. ABD: S, NT, ND, +BS. No rebound. No HSM. EXTR: No c/c/e PSYCH: Normally interactive. Conversant.  Normal strength and deep tendon reflex of all extremities.  Normal gait  Assessment and Plan: New daily persistent headache - Plan: Ambulatory referral to Neurology, Rimegepant Sulfate (NURTEC) 75 MG TBDP  Abnormal bone marrow examination  Patient seen today with persistent headaches.  Recent MRI/MRA as above I spoke with neurosurgeon on-call, he states neurosurgery does not need to see this patient.  I will refer her to neurology to further discuss her headaches.  We also decided to try Nurtec for what sounds like possible migraine headache.  We will have her try taking this every other day I asked her to please let me know how this works for her  As above, Abnormality in bone marrow signal on MRI of her head.  I heard back from her hematologist Dr. Lindi Adie.  He plans to bring her in to discuss  Signed Lamar Blinks, MD

## 2022-09-12 NOTE — Telephone Encounter (Signed)
Pt scheduled for 3pm today

## 2022-09-12 NOTE — Patient Instructions (Signed)
Good to see you today- try the Nurtec, take one every other day for headache prevention.  Let me know how this works for you (and if your insurance will cover it)  Please let me know if you don't hear from neurosurgery in the next week or so

## 2022-09-13 ENCOUNTER — Encounter: Payer: Self-pay | Admitting: Hematology and Oncology

## 2022-09-13 ENCOUNTER — Telehealth: Payer: Self-pay | Admitting: Hematology and Oncology

## 2022-09-13 NOTE — Telephone Encounter (Signed)
Scheduled appointment per 10/10 staff message. Patient is aware.

## 2022-09-14 ENCOUNTER — Encounter: Payer: Self-pay | Admitting: Family Medicine

## 2022-09-14 ENCOUNTER — Ambulatory Visit (INDEPENDENT_AMBULATORY_CARE_PROVIDER_SITE_OTHER): Payer: BC Managed Care – PPO | Admitting: Psychiatry

## 2022-09-14 ENCOUNTER — Telehealth: Payer: Self-pay | Admitting: Psychiatry

## 2022-09-14 VITALS — BP 113/45 | HR 78 | Ht 63.0 in | Wt 203.2 lb

## 2022-09-14 DIAGNOSIS — R51 Headache with orthostatic component, not elsewhere classified: Secondary | ICD-10-CM

## 2022-09-14 DIAGNOSIS — H538 Other visual disturbances: Secondary | ICD-10-CM | POA: Diagnosis not present

## 2022-09-14 DIAGNOSIS — I6629 Occlusion and stenosis of unspecified posterior cerebral artery: Secondary | ICD-10-CM

## 2022-09-14 DIAGNOSIS — G43009 Migraine without aura, not intractable, without status migrainosus: Secondary | ICD-10-CM | POA: Diagnosis not present

## 2022-09-14 DIAGNOSIS — M5481 Occipital neuralgia: Secondary | ICD-10-CM

## 2022-09-14 MED ORDER — TRIAMCINOLONE ACETONIDE 0.5 % EX OINT: 1.0000 | TOPICAL_OINTMENT | Freq: Two times a day (BID) | CUTANEOUS | 1 refills | Status: DC

## 2022-09-14 MED ORDER — TIZANIDINE HCL 4 MG PO TABS: 4.0000 mg | ORAL_TABLET | Freq: Three times a day (TID) | ORAL | 6 refills | Status: AC | PRN

## 2022-09-14 NOTE — Progress Notes (Signed)
Referring:  Darreld Mclean, MD [address] [place],  [place] [address]  PCP: Darreld Mclean, MD  Neurology was asked to evaluate Paula Benson, a 36 year old female for a chief complaint of headaches.  Our recommendations of care will be communicated by shared medical record.    CC:  headaches  History provided from self  HPI:  Medical co-morbidities: multiple PEs, asthma, iron deficiency anemia, migraines  The patient presents for evaluation of headaches which began 1.5 months ago. She describes her headaches as sharp pain in her left temple which typically lasts for 4-5 minutes at a time, but can occur multiple times per day. She did have one time where the headache lasted the entire day. Headaches are associated with photophobia, phonophobia, and nausea. She currently has a headache every day. PCP recently prescribed Nurtec every other day, which she has picked up but has not yet started.  She also noticed intermittent vertigo and lightheadedness. This is sometimes associated with her headache but can occur without it as well. She has also noticed worsening blurred vision which is constant. Feels her eyes are "jumping around" at times. She will occasionally hear her heartbeat in her ears. Saw an optometrist who recommended bifocals, and has not seen an ophthalmologist.  She has a history of migraines for several years but states her current headaches feel different.  MRI brain 09/09/22 showed abnormal T1 hypointense marrow in the calvarium and C-spine. Brain was unremarkable. She is scheduled to see her hematologist next week.  MRA showed moderate left PCA stenosis and was otherwise unremarkable. Recent A1c was 5.6 and LDL was 89.  Headache History: Onset: 1.5 months ago Triggers: none Aura: blurred vision Location: left temple, retro-orbital, occiput Quality/Description: sharp Associated Symptoms:  Photophobia: yes  Phonophobia: yes  Nausea:  yes Other symptoms: dizziness Worse with activity?: yes Duration of headaches: 5 minutes-24 hours  Headache days per month: 30 Headache free days per month: 0  Current Treatment: Abortive Aleve  Preventative none  Prior Therapies                                 Nurtec every other day Imitrex 25 mg PRN  LABS: CBC    Component Value Date/Time   WBC 6.4 05/05/2022 1505   RBC 4.30 05/05/2022 1505   HGB 11.4 (L) 05/05/2022 1505   HGB 7.9 (L) 02/17/2022 1355   HCT 35.3 (L) 05/05/2022 1505   PLT 406.0 (H) 05/05/2022 1505   PLT 593 (H) 02/17/2022 1355   MCV 82.1 05/05/2022 1505   MCH 18.4 (L) 02/17/2022 1355   MCHC 32.4 05/05/2022 1505   RDW 26.6 (H) 05/05/2022 1505   LYMPHSABS 2.2 02/17/2022 1355   MONOABS 0.4 02/17/2022 1355   EOSABS 0.3 02/17/2022 1355   BASOSABS 0.1 02/17/2022 1355      Latest Ref Rng & Units 05/05/2022    3:05 PM 12/08/2018   10:37 PM 11/05/2017   12:17 AM  CMP  Glucose 70 - 99 mg/dL 82  85  80   BUN 6 - 23 mg/dL '14  13  17   '$ Creatinine 0.40 - 1.20 mg/dL 0.93  0.91  1.02   Sodium 135 - 145 mEq/L 136  135  136   Potassium 3.5 - 5.1 mEq/L 4.1  3.6  4.0   Chloride 96 - 112 mEq/L 103  102  106   CO2  19 - 32 mEq/L '25  23  23   '$ Calcium 8.4 - 10.5 mg/dL 9.4  8.5  9.2   Total Protein 6.0 - 8.3 g/dL 7.3  7.7    Total Bilirubin 0.2 - 1.2 mg/dL 0.3  0.4    Alkaline Phos 39 - 117 U/L 54  43    AST 0 - 37 U/L 15  30    ALT 0 - 35 U/L 14  18        IMAGING:  MRI brain 09/09/22: 1. Unremarkable MRI appearance of the brain. No evidence of acute intracranial abnormality. 2. Abnormal T1 hypointense marrow signal within the calvarium and visualized upper cervical spine. While this finding can reflect a marrow infiltrative process, the most common causes include chronic anemia, smoking and obesity.  MRA head 09/09/22: 1. No intracranial aneurysm is identified. 2. 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  Imaging  independently reviewed on September 14, 2022   Current Outpatient Medications on File Prior to Visit  Medication Sig Dispense Refill   acetaminophen (TYLENOL) 500 MG tablet Take 1,000 mg by mouth every 6 (six) hours as needed for mild pain, moderate pain or headache.     acyclovir (ZOVIRAX) 200 MG capsule Take 2 capsules (400 mg total) by mouth 2 (two) times daily. 14 capsule 0   albuterol (VENTOLIN HFA) 108 (90 Base) MCG/ACT inhaler Inhale 2 puffs into the lungs every 6 (six) hours as needed for wheezing or shortness of breath. 1 each 6   ALPRAZolam (XANAX) 0.25 MG tablet Take 1 tablet (0.25 mg total) by mouth 3 (three) times daily as needed for anxiety. 20 tablet 0   ARIPiprazole (ABILIFY) 15 MG tablet Take 1 tablet (15 mg total) by mouth daily. 30 tablet 0   busPIRone (BUSPAR) 10 MG tablet Take 1 tablet (10 mg total) by mouth 2 (two) times daily. 60 tablet 0   Fluocinolone Acetonide Scalp 0.01 % OIL Apply to scalp daily as needed, wash out 8- 12 hours later 118 mL 1   lurasidone (LATUDA) 20 MG TABS tablet Take 1 tablet (20 mg total) by mouth daily. 30 tablet 0   Rimegepant Sulfate (NURTEC) 75 MG TBDP Take every other day for headache prevention 15 tablet 2   Semaglutide,0.25 or 0.'5MG'$ /DOS, (OZEMPIC, 0.25 OR 0.5 MG/DOSE,) 2 MG/3ML SOPN Inject 0.5 mg into the skin once a week. 3 mL 1   triamcinolone ointment (KENALOG) 0.5 % Apply 1 Application topically 2 (two) times daily. Use as needed for eczema, do not use on face 60 g 1   Vitamin D, Ergocalciferol, (DRISDOL) 1.25 MG (50000 UNIT) CAPS capsule Take 50,000 Units by mouth once a week.     No current facility-administered medications on file prior to visit.     Allergies: No Known Allergies  Family History: Migraine or other headaches in the family:  daughter has migraines Aneurysms in a first degree relative:  aunt had an aneurysm Brain tumors in the family:  no Other neurological illness in the family:   no  Past Medical History: Past  Medical History:  Diagnosis Date   Anemia    Anxiety    Asthma    Bipolar II disorder ([place])    Complication of anesthesia 2003   ASPIRATED DURING EMERGENCY CS   Depression    doing ok   Eclamptic seizure 2003   Fibroid    Genital herpes    Headache(784.0)    Herpes  History of blood clots    x3   History of blood transfusion MARCH 2013   Menometrorrhagia 07/27/2016   Migraines    Personal history of PE (pulmonary embolism) x 3, latest 02/2010   noncompliant with INR checks, must limit refills   Preeclampsia 2003   with first pregnancy   Pregnancy induced hypertension 2003   seized 2wks after   Pyelonephritis 2012   Seizures ([place]) 2003   after first delivery   Suicidal ideations    Urinary tract infection     Past Surgical History Past Surgical History:  Procedure Laterality Date   CESAREAN SECTION  12/2001 and 01/2008   CESAREAN SECTION  06/26/2012   Procedure: CESAREAN SECTION;  Surgeon: Lavonia Drafts, MD;  Location: [facility];  Service: Obstetrics;  Laterality: N/A;   INDUCED ABORTION  01/2010    Social History: Social History   Tobacco Use   Smoking status: Never   Smokeless tobacco: Never  Substance Use Topics   Alcohol use: No   Drug use: No     ROS: Negative for fevers, chills. Positive for headaches. All other systems reviewed and negative unless stated otherwise in HPI.   Physical Exam:   Vital Signs: BP (!) 113/45   Pulse 78   Ht '5\' 3"'$  (1.6 m)   Wt 203 lb 4 oz (92.2 kg)   BMI 36.00 kg/m  GENERAL: well appearing,in no acute distress,alert SKIN:  Color, texture, turgor normal. No rashes or lesions HEAD:  Normocephalic/atraumatic. CV:  RRR RESP: Normal respiratory effort MSK: no tenderness to palpation over occiput, neck, or shoulders  NEUROLOGICAL: Mental Status: Alert, oriented to person, place and time,Follows commands Cranial Nerves: PERRL, no papilledema visualized, visual fields intact to confrontation, EOM with mildly limited  abduction OU, facial sensation intact, no facial droop or ptosis, hearing grossly intact, no dysarthria, palate elevate symmetrically, tongue protrudes midline, shoulder shrug intact and symmetric Motor: muscle strength 5/5 both upper and lower extremities,no drift, normal tone Reflexes: 2+ throughout Sensation: intact to light touch all 4 extremities Coordination: Finger-to- nose-finger intact bilaterally Gait: normal-based   IMPRESSION: 36 year old female with a history of multiple PEs, asthma, iron deficiency anemia, migraines who presents for evaluation of worsening headaches and blurred vision over the past 1.5 months. No clear papilledema on fundus exam today, but given new pulsatile tinnitus and mild restriction of abduction OU on today's exam, will refer for formal ophthalmology exam. Will order CTV to assess for sinus venous thrombosis given her history of multiple blood clots. MRA with apparent moderate PCA stenosis, though she does not have any clear risk factors for vascular disease. CTA ordered to clarify severity of this stenosis. Her sharp occipital/retro-orbital pain sounds consistent with occipital neuralgia. Will prescribe PRN tizanidine to help with muscle tension. She will start Nurtec every other day for headache prevention and let me know if this is effective for her.  PLAN: -CTA, CT venogram -Referral to ophthalmology -Start Nurtec 75 mg every other day -Tizanidine 4 mg PRN for muscle spasms  I spent a total of 62 minutes chart reviewing and counseling the patient. Headache education was done. Discussed treatment options including preventive and acute medications. Discussed medication side effects, adverse reactions and drug interactions. Written educational materials and patient instructions outlining all of the above were given.  Follow-up: 3 months   Genia Harold, MD 09/14/2022   3:55 PM

## 2022-09-14 NOTE — Telephone Encounter (Signed)
Paperwork has been printed and is in folder.

## 2022-09-14 NOTE — Telephone Encounter (Signed)
Referral for Ophthalmology sent to [facility]. Phone: [phone], Fax: [phone]

## 2022-09-15 DIAGNOSIS — F3132 Bipolar disorder, current episode depressed, moderate: Secondary | ICD-10-CM | POA: Diagnosis not present

## 2022-09-15 DIAGNOSIS — F411 Generalized anxiety disorder: Secondary | ICD-10-CM | POA: Diagnosis not present

## 2022-09-15 DIAGNOSIS — F40298 Other specified phobia: Secondary | ICD-10-CM | POA: Diagnosis not present

## 2022-09-16 NOTE — Progress Notes (Signed)
Patient Care Team: Nicholas Lose, MD as PCP - General (Hematology and Oncology)  DIAGNOSIS:  Encounter Diagnosis  Name Primary?   Iron deficiency anemia, unspecified iron deficiency anemia type    CHIEF COMPLIANT: Follow-up to review Mri of brain  INTERVAL HISTORY: Paula Benson is a 36 y.o. female is here because of extensive history of iron deficiency anemia. She presents to the clinic today for follow-up. She complains of headaches has been sharp on the left side. She states that she does notice blood in her stool.  She had recurrent headaches and underwent a brain MRI and MRA.  The scans revealed that there was a T1 hypointense marrow signal within the calvarium and upper cervical spine.  This could be related to but additional work-up was recommended.  She was sent back to [place] for discussion regarding this.   ALLERGIES:  has No Known Allergies.  MEDICATIONS:  Current Outpatient Medications  Medication Sig Dispense Refill   acetaminophen (TYLENOL) 500 MG tablet Take 1,000 mg by mouth every 6 (six) hours as needed for mild pain, moderate pain or headache.     acyclovir (ZOVIRAX) 200 MG capsule Take 2 capsules (400 mg total) by mouth 2 (two) times daily. 14 capsule 0   albuterol (VENTOLIN HFA) 108 (90 Base) MCG/ACT inhaler Inhale 2 puffs into the lungs every 6 (six) hours as needed for wheezing or shortness of breath. 1 each 6   ALPRAZolam (XANAX) 0.25 MG tablet Take 1 tablet (0.25 mg total) by mouth 3 (three) times daily as needed for anxiety. 20 tablet 0   ARIPiprazole (ABILIFY) 15 MG tablet Take 1 tablet (15 mg total) by mouth daily. 30 tablet 0   busPIRone (BUSPAR) 10 MG tablet Take 1 tablet (10 mg total) by mouth 2 (two) times daily. 60 tablet 0   Fluocinolone Acetonide Scalp 0.01 % OIL Apply to scalp daily as needed, wash out 8- 12 hours later 118 mL 1   lurasidone (LATUDA) 20 MG TABS tablet Take 1 tablet (20 mg total) by mouth daily. 30 tablet 0   Rimegepant Sulfate (NURTEC)  75 MG TBDP Take every other day for headache prevention 15 tablet 2   Semaglutide,0.25 or 0.5MG/DOS, (OZEMPIC, 0.25 OR 0.5 MG/DOSE,) 2 MG/3ML SOPN Inject 0.5 mg into the skin once a week. 3 mL 1   tiZANidine (ZANAFLEX) 4 MG tablet Take 1 tablet (4 mg total) by mouth every 8 (eight) hours as needed for muscle spasms. 30 tablet 6   triamcinolone ointment (KENALOG) 0.5 % Apply 1 Application topically 2 (two) times daily. Use as needed for eczema, do not use on face 60 g 1   Vitamin D, Ergocalciferol, (DRISDOL) 1.25 MG (50000 UNIT) CAPS capsule Take 50,000 Units by mouth once a week.     No current facility-administered medications for this visit.    PHYSICAL EXAMINATION: ECOG PERFORMANCE STATUS: 1 - Symptomatic but completely ambulatory  Vitals:   09/19/22 1332  BP: (!) 141/82  Pulse: 89  Resp: 17  Temp: (!) 97.5 F (36.4 C)  SpO2: 99%   Filed Weights   09/19/22 1332  Weight: 201 lb 9.6 oz (91.4 kg)      LABORATORY DATA:  I have reviewed the data as listed    Latest Ref Rng & Units 05/05/2022    3:05 PM 12/08/2018   10:37 PM 11/05/2017   12:17 AM  CMP  Glucose 70 - 99 mg/dL 82  85  80   BUN 6 - 23 mg/dL  _0 Creatinine 0.40 - 1.20 mg/dL 0.93  0.91  1.02   Sodium 135 - 145 mEq/L 136  135  136   Potassium 3.5 - 5.1 mEq/L 4.1  3.6  4.0   Chloride 96 - 112 mEq/L 103  102  106   CO2 19 - 32 mEq/L _1 Calcium 8.4 - 10.5 mg/dL 9.4  8.5  9.2   Total Protein 6.0 - 8.3 g/dL 7.3  7.7    Total Bilirubin 0.2 - 1.2 mg/dL 0.3  0.4    Alkaline Phos 39 - 117 U/L 54  43    AST 0 - 37 U/L 15  30    ALT 0 - 35 U/L 14  18      Lab Results  Component Value Date   WBC 5.8 09/19/2022   HGB 10.9 (L) 09/19/2022   HCT 34.9 (L) 09/19/2022   MCV 75.4 (L) 09/19/2022   PLT 496 (H) 09/19/2022   NEUTROABS 3.2 09/19/2022    ASSESSMENT & PLAN:  Iron deficiency anemia Chronic iron deficiency anemia: Labs  02/06/2022: Ferritin 2.6, TIBC 669, there was no hemoglobin available to  review. 02/17/2022: Hemoglobin 7.9, MCV 66, RDW 21.2, platelets 593,   Patient tells me that she has had iron deficiency anemia for a long long time.  Over the course of several years she has received iron infusions as well as blood transfusions.  She is also taking oral iron replacement therapy which apparently does not improve her symptoms.   (She had a prior history of multiple PEs and had seen Dr. Marin Olp previously)  IV iron: March 2023  MRI brain: 09/09/2022: Abnormal T1 hypointense marrow signal within the calvarium and upper cervical spine.  This could be related to her anemia.  We will check her CBC iron studies along with multiple myeloma panel. I will call her in a week to discuss results of this test.  Orders Placed This Encounter  Procedures   Multiple Myeloma Panel (SPEP&IFE w/QIG)    Standing Status:   Future    Number of Occurrences:   1    Standing Expiration Date:   09/20/2023   Kappa/lambda light chains    Standing Status:   Future    Number of Occurrences:   1    Standing Expiration Date:   09/20/2023   CBC with Differential ([place] Only)    Standing Status:   Future    Number of Occurrences:   1    Standing Expiration Date:   09/20/2023   Ferritin    Standing Status:   Future    Number of Occurrences:   1    Standing Expiration Date:   09/19/2023   Iron and Iron Binding Capacity ([facility] only)    Standing Status:   Future    Number of Occurrences:   1    Standing Expiration Date:   09/20/2023   The patient has a good understanding of the overall plan. she agrees with it. she will call with any problems that may develop before the next visit here. Total time spent: 30 mins including face to face time and time spent for planning, charting and co-ordination of care   Harriette Ohara, MD 09/19/22    I Gardiner Coins am scribing for Dr. Lindi Adie  I have reviewed the above documentation for accuracy and completeness, and I agree with the above.

## 2022-09-18 NOTE — Progress Notes (Unsigned)
[facility] at [facility] [address] [place], [place] [address] [phone]  Date:  09/21/2022   Name:  Paula [facility]   DOB:  1986-01-12   MRN:  [id number]  PCP:  Darreld Mclean, MD    Chief Complaint: No chief complaint on file.   History of Present Illness:  Paula Benson is a 36 y.o. very pleasant female patient who presents with the following:  Patient seen today to discuss her recent MRI report-changed to a virtual visit. Patient location is home, my location is office.  Patient identity confirmed with 2 factors, she gives consent for virtual visit today.  The patient and myself are present on the visit today  History of multiple pulmonary embolism, asthma, iron deficiency anemia, migraine, mood disorder  She was seen by neurology on 10/11 to follow-up on recent brain MRI which revealed PCA stenosis: IMPRESSION: 36 year old female with a history of multiple PEs, asthma, iron deficiency anemia, migraines who presents for evaluation of worsening headaches and blurred vision over the past 1.5 months. No clear papilledema on fundus exam today, but given new pulsatile tinnitus and mild restriction of abduction OU on today's exam, will refer for formal ophthalmology exam. Will order CTV to assess for sinus venous thrombosis given her history of multiple blood clots. MRA with apparent moderate PCA stenosis, though she does not have any clear risk factors for vascular disease. CTA ordered to clarify severity of this stenosis. Her sharp occipital/retro-orbital pain sounds consistent with occipital neuralgia. Will prescribe PRN tizanidine to help with muscle tension. She will start Nurtec every other day for headache prevention and let me know if this is effective for her.   PLAN: -CTA, CT venogram -Referral to ophthalmology -Start Nurtec 75 mg every other day -Tizanidine 4 mg PRN for muscle spasms I spent a total of 62 minutes  chart reviewing and counseling the patient. Headache education was done. Discussed treatment options including preventive and acute medications. Discussed medication side effects, adverse reactions and drug interactions. Written educational materials and patient instructions outlining all of the above were given.   Flu shot Tetanus  She was seen by hematology on Monday- she is iron deficient and will be going in for iron infusions, starting next week Pt notes she is feeling tired; she wonders if she can get her iron infusion sooner than Monday.  I can certainly send a message to her hematologist and inquire  Unfortunately Nurtec did not seem to help, her headaches are about the same CTA has not been done yet  As [facility] it is difficult for me to evaluate her in more detail over virtual format.  She would like to be seen in person, we made appointment for tomorrow For the time being she feels that she is stable, does not need any emergency care   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[place]) 12/31/2015   CAP (community acquired pneumonia) 11/02/2014   Pyelonephritis 10/31/2014   Hypokalemia 10/31/2014   Neck pain 02/05/2014   Migraine, unspecified, without mention of intractable migraine without mention of status migrainosus 08/14/2013   Palpitations 04/12/2012   Hx of preeclampsia, prior pregnancy, currently pregnant--had eclamptic seizure 2 weeks postpartum 02/20/2012   History of aspiration pneumonitis 02/20/2012   Iron deficiency anemia 02/20/2012   Recurrent urinary tract infection 08/25/2011   Genital herpes 06/09/2011   Panic disorder  03/21/2011   History of pulmonary embolus (PE) 03/17/2011   Chronic anticoagulation 03/17/2011   ASTHMA, INTERMITTENT 03/04/2010    Past Medical History:  Diagnosis Date   Anemia    Anxiety    Asthma    Bipolar II disorder ([place])    Complication of anesthesia 2003    ASPIRATED DURING EMERGENCY CS   Depression    doing ok   Eclamptic seizure 2003   Fibroid    Genital herpes    Headache(784.0)    Herpes    History of blood clots    x3   History of blood transfusion MARCH 2013   Menometrorrhagia 07/27/2016   Migraines    Personal history of PE (pulmonary embolism) x 3, latest 02/2010   noncompliant with INR checks, must limit refills   Preeclampsia 2003   with first pregnancy   Pregnancy induced hypertension 2003   seized 2wks after   Pyelonephritis 2012   Seizures ([place]) 2003   after first delivery   Suicidal ideations    Urinary tract infection     Past Surgical History:  Procedure Laterality Date   CESAREAN SECTION  12/2001 and 01/2008   CESAREAN SECTION  06/26/2012   Procedure: CESAREAN SECTION;  Surgeon: Lavonia Drafts, MD;  Location: [place] [facility];  Service: Obstetrics;  Laterality: N/A;   INDUCED ABORTION  01/2010    Social History   Tobacco Use   Smoking status: Never   Smokeless tobacco: Never  Substance Use Topics   Alcohol use: No   Drug use: No    Family History  Problem Relation Age of Onset   Diabetes Mother        Type 2   Anemia Sister    Hypertension Sister    Breast cancer Maternal Aunt    Anesthesia problems Neg Hx     No Known Allergies  Medication list has been reviewed and updated.  Current Outpatient Medications on File Prior to Visit  Medication Sig Dispense Refill   acetaminophen (TYLENOL) 500 MG tablet Take 1,000 mg by mouth every 6 (six) hours as needed for mild pain, moderate pain or headache.     acyclovir (ZOVIRAX) 200 MG capsule Take 2 capsules (400 mg total) by mouth 2 (two) times daily. 14 capsule 0   albuterol (VENTOLIN HFA) 108 (90 Base) MCG/ACT inhaler Inhale 2 puffs into the lungs every 6 (six) hours as needed for wheezing or shortness of breath. 1 each 6   ALPRAZolam (XANAX) 0.25 MG tablet Take 1 tablet (0.25 mg total) by mouth 3 (three) times daily as needed for anxiety. 20 tablet 0    ARIPiprazole (ABILIFY) 15 MG tablet Take 1 tablet (15 mg total) by mouth daily. 30 tablet 0   busPIRone (BUSPAR) 10 MG tablet Take 1 tablet (10 mg total) by mouth 2 (two) times daily. 60 tablet 0   Fluocinolone Acetonide Scalp 0.01 % OIL Apply to scalp daily as needed, wash out 8- 12 hours later 118 mL 1   lurasidone (LATUDA) 20 MG TABS tablet Take 1 tablet (20 mg total) by mouth daily. 30 tablet 0   Rimegepant Sulfate (NURTEC) 75 MG TBDP Take every other day for headache prevention 15 tablet 2   Semaglutide,0.25 or 0.'5MG'$ /DOS, (OZEMPIC, 0.25 OR 0.5 MG/DOSE,) 2 MG/3ML SOPN Inject 0.5 mg into the skin once a week. 3 mL 1   tiZANidine (ZANAFLEX) 4 MG tablet Take 1 tablet (4 mg total) by mouth every 8 (eight) hours as needed for muscle spasms. [place]  tablet 6   triamcinolone ointment (KENALOG) 0.5 % Apply 1 Application topically 2 (two) times daily. Use as needed for eczema, do not use on face 60 g 1   Vitamin D, Ergocalciferol, (DRISDOL) 1.25 MG (50000 UNIT) CAPS capsule Take 50,000 Units by mouth once a week.     No current facility-administered medications on file prior to visit.    Review of Systems:  As per HPI- otherwise negative.   Physical Examination: There were no vitals filed for this visit. There were no vitals filed for this visit. There is no height or weight on file to calculate BMI. Ideal Body Weight:   Patient observed via video monitor.  She is lying in bed, no distress.  Appears her normal self  Assessment and Plan: New daily persistent headache  Bipolar affective disorder, current episode mixed, current episode severity unspecified ([place])  Virtual visit today to discuss her headaches.  Neurology plans for a CT venogram.  Unfortunately Nurtec was not helpful She is also seeing psychiatry for her mood disorder, they are making medication adjustment for her  We made an appointment for an in person visit tomorrow  Signed Lamar Blinks, MD

## 2022-09-18 NOTE — Patient Instructions (Signed)
It was good to see you again today Recommend the latest COVID-19 vaccine this fall

## 2022-09-19 ENCOUNTER — Other Ambulatory Visit: Payer: Self-pay

## 2022-09-19 ENCOUNTER — Inpatient Hospital Stay: Payer: BC Managed Care – PPO | Attending: Hematology and Oncology | Admitting: Hematology and Oncology

## 2022-09-19 ENCOUNTER — Inpatient Hospital Stay: Payer: BC Managed Care – PPO

## 2022-09-19 DIAGNOSIS — D509 Iron deficiency anemia, unspecified: Secondary | ICD-10-CM | POA: Diagnosis not present

## 2022-09-19 DIAGNOSIS — R519 Headache, unspecified: Secondary | ICD-10-CM | POA: Insufficient documentation

## 2022-09-19 DIAGNOSIS — Z79899 Other long term (current) drug therapy: Secondary | ICD-10-CM | POA: Insufficient documentation

## 2022-09-19 DIAGNOSIS — K921 Melena: Secondary | ICD-10-CM | POA: Diagnosis not present

## 2022-09-19 DIAGNOSIS — Z79624 Long term (current) use of inhibitors of nucleotide synthesis: Secondary | ICD-10-CM | POA: Insufficient documentation

## 2022-09-19 LAB — CBC WITH DIFFERENTIAL (CANCER CENTER ONLY)
Abs Immature Granulocytes: 0.01 10*3/uL (ref 0.00–0.07)
Basophils Absolute: 0.1 10*3/uL (ref 0.0–0.1)
Basophils Relative: 1 %
Eosinophils Absolute: 0.1 10*3/uL (ref 0.0–0.5)
Eosinophils Relative: 1 %
HCT: 34.9 % — ABNORMAL LOW (ref 36.0–46.0)
Hemoglobin: 10.9 g/dL — ABNORMAL LOW (ref 12.0–15.0)
Immature Granulocytes: 0 %
Lymphocytes Relative: 36 %
Lymphs Abs: 2.1 10*3/uL (ref 0.7–4.0)
MCH: 23.5 pg — ABNORMAL LOW (ref 26.0–34.0)
MCHC: 31.2 g/dL (ref 30.0–36.0)
MCV: 75.4 fL — ABNORMAL LOW (ref 80.0–100.0)
Monocytes Absolute: 0.4 10*3/uL (ref 0.1–1.0)
Monocytes Relative: 7 %
Neutro Abs: 3.2 10*3/uL (ref 1.7–7.7)
Neutrophils Relative %: 55 %
Platelet Count: 496 10*3/uL — ABNORMAL HIGH (ref 150–400)
RBC: 4.63 MIL/uL (ref 3.87–5.11)
RDW: 17.3 % — ABNORMAL HIGH (ref 11.5–15.5)
WBC Count: 5.8 10*3/uL (ref 4.0–10.5)
nRBC: 0 % (ref 0.0–0.2)

## 2022-09-19 LAB — FERRITIN: Ferritin: 2 ng/mL — ABNORMAL LOW (ref 11–307)

## 2022-09-19 LAB — IRON AND IRON BINDING CAPACITY (CC-WL,HP ONLY)
Iron: 23 ug/dL — ABNORMAL LOW (ref 28–170)
Saturation Ratios: 5 % — ABNORMAL LOW (ref 10.4–31.8)
TIBC: 508 ug/dL — ABNORMAL HIGH (ref 250–450)
UIBC: 485 ug/dL — ABNORMAL HIGH (ref 148–442)

## 2022-09-19 NOTE — Assessment & Plan Note (Signed)
Chronic iron deficiency anemia: Labs  02/06/2022: Ferritin 2.6, TIBC 669, there was no hemoglobin available to review. 02/17/2022: Hemoglobin 7.9, MCV 66, RDW 21.2, platelets 593,  Patient tells me that she has had iron deficiency anemia for a long long time. Over the course of several years she has received iron infusions as well as blood transfusions. She is also taking oral iron replacement therapy which apparently does not improve her symptoms.  (She had a prior history of multiple PEs and had seen Dr. Marin Olp previously)  IV iron: March 2023

## 2022-09-20 ENCOUNTER — Other Ambulatory Visit: Payer: Self-pay | Admitting: Hematology and Oncology

## 2022-09-20 LAB — KAPPA/LAMBDA LIGHT CHAINS
Kappa free light chain: 30 mg/L — ABNORMAL HIGH (ref 3.3–19.4)
Kappa, lambda light chain ratio: 1.99 — ABNORMAL HIGH (ref 0.26–1.65)
Lambda free light chains: 15.1 mg/L (ref 5.7–26.3)

## 2022-09-21 ENCOUNTER — Other Ambulatory Visit: Payer: Self-pay | Admitting: Hematology and Oncology

## 2022-09-21 ENCOUNTER — Telehealth: Payer: Self-pay | Admitting: Psychiatry

## 2022-09-21 ENCOUNTER — Telehealth (INDEPENDENT_AMBULATORY_CARE_PROVIDER_SITE_OTHER): Payer: BC Managed Care – PPO | Admitting: Family Medicine

## 2022-09-21 DIAGNOSIS — G4452 New daily persistent headache (NDPH): Secondary | ICD-10-CM | POA: Diagnosis not present

## 2022-09-21 DIAGNOSIS — F3160 Bipolar disorder, current episode mixed, unspecified: Secondary | ICD-10-CM | POA: Diagnosis not present

## 2022-09-21 NOTE — Telephone Encounter (Signed)
For the CTA head and CT venogram, insurance will only cover one or the other. Please let me know which scan to have scheduled. Thanks!

## 2022-09-21 NOTE — Telephone Encounter (Signed)
Let's schedule the CTV, thanks

## 2022-09-21 NOTE — Telephone Encounter (Signed)
[employer] via Pilar Jarvis auth: [id number] exp. 09/21/2022 - 11/20/2022 sent to GI [phone]

## 2022-09-21 NOTE — Progress Notes (Deleted)
[place] at [facility] [address] [place], [place] [address] [phone]  Date:  09/22/2022   Name:  Paula Benson   DOB:  May 08, 1986   MRN:  [id number]  PCP:  Darreld Mclean, MD    Chief Complaint: No chief complaint on file.   History of Present Illness:  Paula Benson is a 36 y.o. very pleasant female patient who presents with the following:  Seen today for an in person visit, virtual visit yesterday History of multiple pulmonary embolism, asthma, iron deficiency anemia, migraine, mood disorder  She was seen by neurology on 10/11 to follow-up on recent brain MRI which revealed PCA stenosis: IMPRESSION: 36 year old female with a history of multiple PEs, asthma, iron deficiency anemia, migraines who presents for evaluation of worsening headaches and blurred vision over the past 1.5 months. No clear papilledema on fundus exam today, but given new pulsatile tinnitus and mild restriction of abduction OU on today's exam, will refer for formal ophthalmology exam. Will order CTV to assess for sinus venous thrombosis given her history of multiple blood clots. MRA with apparent moderate PCA stenosis, though she does not have any clear risk factors for vascular disease. CTA ordered to clarify severity of this stenosis. Her sharp occipital/retro-orbital pain sounds consistent with occipital neuralgia. Will prescribe PRN tizanidine to help with muscle tension. She will start Nurtec every other day for headache prevention and let me know if this is effective for her.   PLAN: -CTA, CT venogram -Referral to ophthalmology -Start Nurtec 75 mg every other day -Tizanidine 4 mg PRN for muscle spasms I spent a total of 62 minutes chart reviewing and counseling the patient. Headache education was done. Discussed treatment options including preventive and acute medications. Discussed medication side effects, adverse reactions and drug  interactions. Written educational materials and patient instructions outlining all of the above were given.   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[place]) 12/31/2015   CAP (community acquired pneumonia) 11/02/2014   Pyelonephritis 10/31/2014   Hypokalemia 10/31/2014   Neck pain 02/05/2014   Migraine, unspecified, without mention of intractable migraine without mention of status migrainosus 08/14/2013   Palpitations 04/12/2012   Hx of preeclampsia, prior pregnancy, currently pregnant--had eclamptic seizure 2 weeks postpartum 02/20/2012   History of aspiration pneumonitis 02/20/2012   Iron deficiency anemia 02/20/2012   Recurrent urinary tract infection 08/25/2011   Genital herpes 06/09/2011   Panic disorder 03/21/2011   History of pulmonary embolus (PE) 03/17/2011   Chronic anticoagulation 03/17/2011   ASTHMA, INTERMITTENT 03/04/2010    Past Medical History:  Diagnosis Date   Anemia    Anxiety    Asthma    Bipolar II disorder ([place])    Complication of anesthesia 2003   ASPIRATED DURING EMERGENCY CS   Depression    doing ok   Eclamptic seizure 2003   Fibroid    Genital herpes    Headache(784.0)    Herpes    History of blood clots    x3   History of blood transfusion MARCH 2013   Menometrorrhagia 07/27/2016   Migraines    Personal history of PE (pulmonary embolism) x 3, latest 02/2010   noncompliant with INR checks, must limit refills   Preeclampsia 2003   with first pregnancy   Pregnancy induced hypertension 2003   seized 2wks after   Pyelonephritis 2012  Seizures ([place]) 2003   after first delivery   Suicidal ideations    Urinary tract infection     Past Surgical History:  Procedure Laterality Date   CESAREAN SECTION  12/2001 and 01/2008   CESAREAN SECTION  06/26/2012   Procedure: CESAREAN SECTION;  Surgeon: Lavonia Drafts, MD;  Location: [facility];  Service: Obstetrics;  Laterality:  N/A;   INDUCED ABORTION  01/2010    Social History   Tobacco Use   Smoking status: Never   Smokeless tobacco: Never  Substance Use Topics   Alcohol use: No   Drug use: No    Family History  Problem Relation Age of Onset   Diabetes Mother        Type 2   Anemia Sister    Hypertension Sister    Breast cancer Maternal Aunt    Anesthesia problems Neg Hx     No Known Allergies  Medication list has been reviewed and updated.  Current Outpatient Medications on File Prior to Visit  Medication Sig Dispense Refill   acetaminophen (TYLENOL) 500 MG tablet Take 1,000 mg by mouth every 6 (six) hours as needed for mild pain, moderate pain or headache.     acyclovir (ZOVIRAX) 200 MG capsule Take 2 capsules (400 mg total) by mouth 2 (two) times daily. 14 capsule 0   albuterol (VENTOLIN HFA) 108 (90 Base) MCG/ACT inhaler Inhale 2 puffs into the lungs every 6 (six) hours as needed for wheezing or shortness of breath. 1 each 6   ALPRAZolam (XANAX) 0.25 MG tablet Take 1 tablet (0.25 mg total) by mouth 3 (three) times daily as needed for anxiety. 20 tablet 0   ARIPiprazole (ABILIFY) 15 MG tablet Take 1 tablet (15 mg total) by mouth daily. 30 tablet 0   busPIRone (BUSPAR) 10 MG tablet Take 1 tablet (10 mg total) by mouth 2 (two) times daily. 60 tablet 0   Fluocinolone Acetonide Scalp 0.01 % OIL Apply to scalp daily as needed, wash out 8- 12 hours later 118 mL 1   lurasidone (LATUDA) 20 MG TABS tablet Take 1 tablet (20 mg total) by mouth daily. 30 tablet 0   Rimegepant Sulfate (NURTEC) 75 MG TBDP Take every other day for headache prevention 15 tablet 2   Semaglutide,0.25 or 0.'5MG'$ /DOS, (OZEMPIC, 0.25 OR 0.5 MG/DOSE,) 2 MG/3ML SOPN Inject 0.5 mg into the skin once a week. 3 mL 1   tiZANidine (ZANAFLEX) 4 MG tablet Take 1 tablet (4 mg total) by mouth every 8 (eight) hours as needed for muscle spasms. 30 tablet 6   triamcinolone ointment (KENALOG) 0.5 % Apply 1 Application topically 2 (two) times daily.  Use as needed for eczema, do not use on face 60 g 1   Vitamin D, Ergocalciferol, (DRISDOL) 1.25 MG (50000 UNIT) CAPS capsule Take 50,000 Units by mouth once a week.     No current facility-administered medications on file prior to visit.    Review of Systems:  ***  Physical Examination: There were no vitals filed for this visit. There were no vitals filed for this visit. There is no height or weight on file to calculate BMI. Ideal Body Weight:    ***  Assessment and Plan: ***  Signed Lamar Blinks, MD

## 2022-09-22 ENCOUNTER — Other Ambulatory Visit: Payer: Self-pay | Admitting: *Deleted

## 2022-09-22 ENCOUNTER — Ambulatory Visit: Payer: BC Managed Care – PPO | Admitting: Family Medicine

## 2022-09-22 LAB — MULTIPLE MYELOMA PANEL, SERUM
Albumin SerPl Elph-Mcnc: 3.5 g/dL (ref 2.9–4.4)
Albumin/Glob SerPl: 1 (ref 0.7–1.7)
Alpha 1: 0.2 g/dL (ref 0.0–0.4)
Alpha2 Glob SerPl Elph-Mcnc: 0.9 g/dL (ref 0.4–1.0)
B-Globulin SerPl Elph-Mcnc: 1.2 g/dL (ref 0.7–1.3)
Gamma Glob SerPl Elph-Mcnc: 1.4 g/dL (ref 0.4–1.8)
Globulin, Total: 3.7 g/dL (ref 2.2–3.9)
IgA: 293 mg/dL (ref 87–352)
IgG (Immunoglobin G), Serum: 1363 mg/dL (ref 586–1602)
IgM (Immunoglobulin M), Srm: 56 mg/dL (ref 26–217)
Total Protein ELP: 7.2 g/dL (ref 6.0–8.5)

## 2022-09-26 ENCOUNTER — Inpatient Hospital Stay: Payer: BC Managed Care – PPO

## 2022-09-26 ENCOUNTER — Ambulatory Visit: Payer: BC Managed Care – PPO | Admitting: Family Medicine

## 2022-09-26 ENCOUNTER — Telehealth: Payer: Self-pay

## 2022-09-26 ENCOUNTER — Encounter: Payer: Self-pay | Admitting: Hematology and Oncology

## 2022-09-26 VITALS — BP 114/80 | HR 85 | Temp 98.1°F | Resp 16

## 2022-09-26 DIAGNOSIS — D509 Iron deficiency anemia, unspecified: Secondary | ICD-10-CM | POA: Diagnosis not present

## 2022-09-26 DIAGNOSIS — K921 Melena: Secondary | ICD-10-CM | POA: Diagnosis not present

## 2022-09-26 DIAGNOSIS — Z79624 Long term (current) use of inhibitors of nucleotide synthesis: Secondary | ICD-10-CM | POA: Diagnosis not present

## 2022-09-26 DIAGNOSIS — Z79899 Other long term (current) drug therapy: Secondary | ICD-10-CM | POA: Diagnosis not present

## 2022-09-26 DIAGNOSIS — N921 Excessive and frequent menstruation with irregular cycle: Secondary | ICD-10-CM

## 2022-09-26 DIAGNOSIS — R519 Headache, unspecified: Secondary | ICD-10-CM | POA: Diagnosis not present

## 2022-09-26 DIAGNOSIS — Z7901 Long term (current) use of anticoagulants: Secondary | ICD-10-CM

## 2022-09-26 MED ORDER — SODIUM CHLORIDE 0.9 % IV SOLN
300.0000 mg | Freq: Once | INTRAVENOUS | Status: AC
  Administered 2022-09-26: 300 mg via INTRAVENOUS
  Filled 2022-09-26: qty 300

## 2022-09-26 MED ORDER — DIPHENHYDRAMINE HCL 25 MG PO CAPS
25.0000 mg | ORAL_CAPSULE | Freq: Once | ORAL | Status: AC
  Administered 2022-09-26: 25 mg via ORAL
  Filled 2022-09-26: qty 1

## 2022-09-26 MED ORDER — ACETAMINOPHEN 325 MG PO TABS
650.0000 mg | ORAL_TABLET | Freq: Once | ORAL | Status: AC
  Administered 2022-09-26: 650 mg via ORAL
  Filled 2022-09-26: qty 2

## 2022-09-26 MED ORDER — SODIUM CHLORIDE 0.9 % IV SOLN: Freq: Once | INTRAVENOUS | Status: AC

## 2022-09-26 NOTE — Telephone Encounter (Signed)
No show letter sent via mychart and mailed to pt home.

## 2022-09-26 NOTE — Patient Instructions (Signed)

## 2022-09-28 ENCOUNTER — Other Ambulatory Visit: Payer: BC Managed Care – PPO

## 2022-09-29 DIAGNOSIS — F411 Generalized anxiety disorder: Secondary | ICD-10-CM | POA: Diagnosis not present

## 2022-09-29 DIAGNOSIS — F40298 Other specified phobia: Secondary | ICD-10-CM | POA: Diagnosis not present

## 2022-09-29 DIAGNOSIS — F3132 Bipolar disorder, current episode depressed, moderate: Secondary | ICD-10-CM | POA: Diagnosis not present

## 2022-09-29 DIAGNOSIS — F422 Mixed obsessional thoughts and acts: Secondary | ICD-10-CM | POA: Diagnosis not present

## 2022-10-02 NOTE — Progress Notes (Unsigned)
[place] at [facility] [address] [place], [place] [address] [phone]  Date:  10/05/2022   Name:  Paula Benson   DOB:  06/05/1986   MRN:  [id number]  PCP:  Darreld Mclean, MD    Chief Complaint: No chief complaint on file.   History of Present Illness:  Paula Benson is a 36 y.o. 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[place]) 12/31/2015   CAP (community acquired pneumonia) 11/02/2014   Pyelonephritis 10/31/2014   Hypokalemia 10/31/2014   Neck pain 02/05/2014   Migraine, unspecified, without mention of intractable migraine without mention of status migrainosus 08/14/2013   Palpitations 04/12/2012   Hx of preeclampsia, prior pregnancy, currently pregnant--had eclamptic seizure 2 weeks postpartum 02/20/2012   History of aspiration pneumonitis 02/20/2012   Iron deficiency anemia 02/20/2012   Recurrent urinary tract infection 08/25/2011   Genital herpes 06/09/2011   Panic disorder 03/21/2011   History of pulmonary embolus (PE) 03/17/2011   Chronic anticoagulation 03/17/2011   ASTHMA, INTERMITTENT 03/04/2010    Past Medical History:  Diagnosis Date   Anemia    Anxiety    Asthma    Bipolar II disorder ([place])    Complication of anesthesia 2003   ASPIRATED DURING EMERGENCY CS   Depression    doing ok   Eclamptic seizure 2003   Fibroid    Genital herpes    Headache(784.0)    Herpes    History of blood clots    x3   History of blood transfusion MARCH 2013   Menometrorrhagia 07/27/2016   Migraines    Personal history of PE (pulmonary embolism) x 3, latest 02/2010   noncompliant with INR checks, must limit refills   Preeclampsia 2003   with first pregnancy   Pregnancy induced hypertension 2003   seized 2wks after    Pyelonephritis 2012   Seizures ([place]) 2003   after first delivery   Suicidal ideations    Urinary tract infection     Past Surgical History:  Procedure Laterality Date   CESAREAN SECTION  12/2001 and 01/2008   CESAREAN SECTION  06/26/2012   Procedure: CESAREAN SECTION;  Surgeon: Lavonia Drafts, MD;  Location: [facility];  Service: Obstetrics;  Laterality: N/A;   INDUCED ABORTION  01/2010    Social History   Tobacco Use   Smoking status: Never   Smokeless tobacco: Never  Substance Use Topics   Alcohol use: No   Drug use: No    Family History  Problem Relation Age of Onset   Diabetes Mother        Type 2   Anemia Sister    Hypertension Sister    Breast cancer Maternal Aunt    Anesthesia problems Neg Hx     No Known Allergies  Medication list has been reviewed and updated.  Current Outpatient Medications on File Prior to Visit  Medication Sig Dispense Refill   acetaminophen (TYLENOL) 500 MG tablet Take 1,000 mg by mouth every 6 (six) hours as needed for mild pain, moderate pain or headache.     acyclovir (ZOVIRAX) 200 MG capsule Take 2 capsules (400 mg total) by mouth 2 (two) times daily. 14 capsule 0   albuterol (VENTOLIN HFA) 108 (90 Base) MCG/ACT inhaler Inhale 2 puffs into the  lungs every 6 (six) hours as needed for wheezing or shortness of breath. 1 each 6   ALPRAZolam (XANAX) 0.25 MG tablet Take 1 tablet (0.25 mg total) by mouth 3 (three) times daily as needed for anxiety. 20 tablet 0   ARIPiprazole (ABILIFY) 15 MG tablet Take 1 tablet (15 mg total) by mouth daily. 30 tablet 0   busPIRone (BUSPAR) 10 MG tablet Take 1 tablet (10 mg total) by mouth 2 (two) times daily. 60 tablet 0   Fluocinolone Acetonide Scalp 0.01 % OIL Apply to scalp daily as needed, wash out 8- 12 hours later 118 mL 1   lurasidone (LATUDA) 20 MG TABS tablet Take 1 tablet (20 mg total) by mouth daily. 30 tablet 0   Rimegepant Sulfate (NURTEC) 75 MG TBDP Take every other day for headache prevention  15 tablet 2   Semaglutide,0.25 or 0.'5MG'$ /DOS, (OZEMPIC, 0.25 OR 0.5 MG/DOSE,) 2 MG/3ML SOPN Inject 0.5 mg into the skin once a week. 3 mL 1   tiZANidine (ZANAFLEX) 4 MG tablet Take 1 tablet (4 mg total) by mouth every 8 (eight) hours as needed for muscle spasms. 30 tablet 6   triamcinolone ointment (KENALOG) 0.5 % Apply 1 Application topically 2 (two) times daily. Use as needed for eczema, do not use on face 60 g 1   Vitamin D, Ergocalciferol, (DRISDOL) 1.25 MG (50000 UNIT) CAPS capsule Take 50,000 Units by mouth once a week.     No current facility-administered medications on file prior to visit.    Review of Systems:  As per HPI- otherwise negative.   Physical Examination: There were no vitals filed for this visit. There were no vitals filed for this visit. There is no height or weight on file to calculate BMI. Ideal Body Weight:    GEN: no acute distress. HEENT: Atraumatic, Normocephalic.  Ears and Nose: No external deformity. CV: RRR, No M/G/R. No JVD. No thrill. No extra heart sounds. PULM: CTA B, no wheezes, crackles, rhonchi. No retractions. No resp. distress. No accessory muscle use. ABD: S, NT, ND, +BS. No rebound. No HSM. EXTR: No c/c/e PSYCH: Normally interactive. Conversant.    Assessment and Plan: ***  Signed Lamar Blinks, MD

## 2022-10-03 ENCOUNTER — Encounter: Payer: Self-pay | Admitting: Family Medicine

## 2022-10-03 ENCOUNTER — Inpatient Hospital Stay: Payer: BC Managed Care – PPO

## 2022-10-03 DIAGNOSIS — Z79899 Other long term (current) drug therapy: Secondary | ICD-10-CM | POA: Diagnosis not present

## 2022-10-03 DIAGNOSIS — N921 Excessive and frequent menstruation with irregular cycle: Secondary | ICD-10-CM

## 2022-10-03 DIAGNOSIS — K921 Melena: Secondary | ICD-10-CM | POA: Diagnosis not present

## 2022-10-03 DIAGNOSIS — R519 Headache, unspecified: Secondary | ICD-10-CM | POA: Diagnosis not present

## 2022-10-03 DIAGNOSIS — D509 Iron deficiency anemia, unspecified: Secondary | ICD-10-CM

## 2022-10-03 DIAGNOSIS — Z79624 Long term (current) use of inhibitors of nucleotide synthesis: Secondary | ICD-10-CM | POA: Diagnosis not present

## 2022-10-03 DIAGNOSIS — Z7901 Long term (current) use of anticoagulants: Secondary | ICD-10-CM

## 2022-10-03 MED ORDER — SODIUM CHLORIDE 0.9 % IV SOLN
300.0000 mg | Freq: Once | INTRAVENOUS | Status: AC
  Administered 2022-10-03: 300 mg via INTRAVENOUS
  Filled 2022-10-03: qty 300

## 2022-10-03 MED ORDER — ACETAMINOPHEN 325 MG PO TABS
650.0000 mg | ORAL_TABLET | Freq: Once | ORAL | Status: AC
  Administered 2022-10-03: 650 mg via ORAL
  Filled 2022-10-03: qty 2

## 2022-10-03 MED ORDER — SODIUM CHLORIDE 0.9 % IV SOLN: INTRAVENOUS | Status: DC

## 2022-10-03 MED ORDER — DIPHENHYDRAMINE HCL 25 MG PO CAPS
25.0000 mg | ORAL_CAPSULE | Freq: Once | ORAL | Status: AC
  Administered 2022-10-03: 25 mg via ORAL
  Filled 2022-10-03: qty 1

## 2022-10-05 ENCOUNTER — Ambulatory Visit (INDEPENDENT_AMBULATORY_CARE_PROVIDER_SITE_OTHER): Payer: BC Managed Care – PPO | Admitting: Family Medicine

## 2022-10-05 ENCOUNTER — Encounter: Payer: Self-pay | Admitting: Family Medicine

## 2022-10-05 VITALS — BP 120/78 | HR 79 | Temp 97.6°F | Resp 18 | Ht 63.0 in | Wt 201.2 lb

## 2022-10-05 DIAGNOSIS — R222 Localized swelling, mass and lump, trunk: Secondary | ICD-10-CM

## 2022-10-05 DIAGNOSIS — G4452 New daily persistent headache (NDPH): Secondary | ICD-10-CM

## 2022-10-05 DIAGNOSIS — F411 Generalized anxiety disorder: Secondary | ICD-10-CM | POA: Diagnosis not present

## 2022-10-05 DIAGNOSIS — F422 Mixed obsessional thoughts and acts: Secondary | ICD-10-CM | POA: Diagnosis not present

## 2022-10-05 DIAGNOSIS — R5383 Other fatigue: Secondary | ICD-10-CM | POA: Diagnosis not present

## 2022-10-05 DIAGNOSIS — R82998 Other abnormal findings in urine: Secondary | ICD-10-CM | POA: Diagnosis not present

## 2022-10-05 DIAGNOSIS — Z131 Encounter for screening for diabetes mellitus: Secondary | ICD-10-CM

## 2022-10-05 DIAGNOSIS — Z1159 Encounter for screening for other viral diseases: Secondary | ICD-10-CM

## 2022-10-05 DIAGNOSIS — D509 Iron deficiency anemia, unspecified: Secondary | ICD-10-CM

## 2022-10-05 DIAGNOSIS — Z832 Family history of diseases of the blood and blood-forming organs and certain disorders involving the immune mechanism: Secondary | ICD-10-CM

## 2022-10-05 DIAGNOSIS — F3132 Bipolar disorder, current episode depressed, moderate: Secondary | ICD-10-CM | POA: Diagnosis not present

## 2022-10-05 LAB — CBC
HCT: 36.1 % (ref 36.0–46.0)
Hemoglobin: 11.1 g/dL — ABNORMAL LOW (ref 12.0–15.0)
MCHC: 30.9 g/dL (ref 30.0–36.0)
MCV: 75.6 fl — ABNORMAL LOW (ref 78.0–100.0)
Platelets: 371 10*3/uL (ref 150.0–400.0)
RBC: 4.77 Mil/uL (ref 3.87–5.11)
RDW: 19 % — ABNORMAL HIGH (ref 11.5–15.5)
WBC: 6.1 10*3/uL (ref 4.0–10.5)

## 2022-10-05 LAB — COMPREHENSIVE METABOLIC PANEL
ALT: 16 U/L (ref 0–35)
AST: 16 U/L (ref 0–37)
Albumin: 4.2 g/dL (ref 3.5–5.2)
Alkaline Phosphatase: 58 U/L (ref 39–117)
BUN: 12 mg/dL (ref 6–23)
CO2: 25 mEq/L (ref 19–32)
Calcium: 9.2 mg/dL (ref 8.4–10.5)
Chloride: 103 mEq/L (ref 96–112)
Creatinine, Ser: 0.88 mg/dL (ref 0.40–1.20)
GFR: 83.15 mL/min (ref >60.00)
Glucose, Bld: 84 mg/dL (ref 70–99)
Potassium: 4.3 mEq/L (ref 3.5–5.1)
Sodium: 135 mEq/L (ref 135–145)
Total Bilirubin: 0.4 mg/dL (ref 0.2–1.2)
Total Protein: 7.5 g/dL (ref 6.0–8.3)

## 2022-10-05 LAB — POCT URINALYSIS DIP (MANUAL ENTRY)
Bilirubin, UA: NEGATIVE
Blood, UA: NEGATIVE
Glucose, UA: NEGATIVE mg/dL
Ketones, POC UA: NEGATIVE mg/dL
Leukocytes, UA: NEGATIVE
Nitrite, UA: NEGATIVE
Protein Ur, POC: NEGATIVE mg/dL
Spec Grav, UA: 1.015 (ref 1.010–1.025)
Urobilinogen, UA: 0.2 E.U./dL
pH, UA: 6.5 (ref 5.0–8.0)

## 2022-10-05 LAB — TSH: TSH: 1.58 u[IU]/mL (ref 0.35–5.50)

## 2022-10-05 LAB — HEMOGLOBIN A1C: Hgb A1c MFr Bld: 5.8 % (ref 4.6–6.5)

## 2022-10-05 LAB — POCT URINE PREGNANCY: Preg Test, Ur: NEGATIVE

## 2022-10-06 ENCOUNTER — Ambulatory Visit
Admission: RE | Admit: 2022-10-06 | Discharge: 2022-10-06 | Disposition: A | Discharge status: Home / Self Care | Payer: BC Managed Care – PPO | Source: Ambulatory Visit | Attending: Psychiatry | Admitting: Psychiatry

## 2022-10-06 ENCOUNTER — Other Ambulatory Visit: Payer: Self-pay | Admitting: Psychiatry

## 2022-10-06 DIAGNOSIS — R51 Headache with orthostatic component, not elsewhere classified: Secondary | ICD-10-CM

## 2022-10-06 LAB — HEPATITIS C ANTIBODY: Hepatitis C Ab: NONREACTIVE

## 2022-10-08 ENCOUNTER — Encounter: Payer: Self-pay | Admitting: Family Medicine

## 2022-10-10 ENCOUNTER — Inpatient Hospital Stay: Payer: BC Managed Care – PPO | Attending: Hematology and Oncology

## 2022-10-10 ENCOUNTER — Other Ambulatory Visit: Payer: Self-pay

## 2022-10-10 ENCOUNTER — Inpatient Hospital Stay (HOSPITAL_BASED_OUTPATIENT_CLINIC_OR_DEPARTMENT_OTHER): Payer: BC Managed Care – PPO | Admitting: Physician Assistant

## 2022-10-10 VITALS — BP 114/71 | HR 88 | Resp 14

## 2022-10-10 DIAGNOSIS — Z79899 Other long term (current) drug therapy: Secondary | ICD-10-CM | POA: Diagnosis not present

## 2022-10-10 DIAGNOSIS — Z79624 Long term (current) use of inhibitors of nucleotide synthesis: Secondary | ICD-10-CM | POA: Insufficient documentation

## 2022-10-10 DIAGNOSIS — N921 Excessive and frequent menstruation with irregular cycle: Secondary | ICD-10-CM

## 2022-10-10 DIAGNOSIS — D509 Iron deficiency anemia, unspecified: Secondary | ICD-10-CM | POA: Diagnosis not present

## 2022-10-10 DIAGNOSIS — M79602 Pain in left arm: Secondary | ICD-10-CM | POA: Diagnosis not present

## 2022-10-10 DIAGNOSIS — Z7901 Long term (current) use of anticoagulants: Secondary | ICD-10-CM

## 2022-10-10 MED ORDER — SODIUM CHLORIDE 0.9 % IV SOLN
300.0000 mg | Freq: Once | INTRAVENOUS | Status: AC
  Administered 2022-10-10: 300 mg via INTRAVENOUS
  Filled 2022-10-10: qty 300

## 2022-10-10 MED ORDER — ACETAMINOPHEN 325 MG PO TABS
650.0000 mg | ORAL_TABLET | Freq: Once | ORAL | Status: AC
  Administered 2022-10-10: 650 mg via ORAL
  Filled 2022-10-10: qty 2

## 2022-10-10 MED ORDER — DIPHENHYDRAMINE HCL 25 MG PO CAPS
25.0000 mg | ORAL_CAPSULE | Freq: Once | ORAL | Status: AC
  Administered 2022-10-10: 25 mg via ORAL
  Filled 2022-10-10: qty 1

## 2022-10-10 NOTE — Patient Instructions (Signed)

## 2022-10-10 NOTE — Progress Notes (Signed)
Approximately 30 minutes into infusion of Venofer, patient report pain in her AC space.Infusion paused, IV checked for patency. Blood return noted and flushed easily with no resistance. No swelling noted. Several minutes later, patient reported pain in left wrist. Again, IV checked for patency and blood return noted, flushed easily. RN stated to patient that although IV appeared to be working properly, we should replace the IV and rotate sites. Patient declined, stating she felt fine now and would like to proceed using current IV site. RN checked site several times throughout infusion and no swelling noted. After flushing when Venofer was completed, slight swelling noted around IV site, but IV still had good blood return and flushed easily. Sonda Rumble notified and at chairside for assessment.

## 2022-10-10 NOTE — Progress Notes (Signed)
Symptom Management Consult note [facility]    Patient Care Team: Copland, Gay Filler, MD as PCP - General (Family Medicine)    Name of the patient: Paula Benson  [id number]  December 16, 1985   Date of visit: 10/10/2022   Chief Complaint/Reason for visit: arm pain   Current Therapy: IV Venofer 300  Last treatment:  today   ASSESSMENT & PLAN: Patient is a 36 y.o. female  with pertient history of iron deficiency anemia followed by Dr. Lindi Adie.  I have viewed most recent oncology note and lab work.    #) Iron deficiency anemia - Known history of this. Here for infusion today   #) left arm pain -Pain and minimal swelling around IV site. She had pain in her left AC and left wrist during treatment with good blood return and without pain during saline flush. RN offered to start new IV on right arm which patient refused. -Swelling around IV site started after post infusion flush. Question if this is infiltration from iron infusion vs NS. Timing of onset more suggestive of NS.  -Compartments in left upper extremity are soft and neurovascularly intact. Heat was applied. Discussed symptomatic care and ED precautions with patient should symptoms worsen.     Heme/Onc History: Oncology History   No history exists.      Interval history-: Paula Benson is a 36 y.o. female with pertinent history of IDA seen in the infusion center today with chief complaint of sudden onset of left arm pain.  Patient had just finished iron infusion which she has had multiple times in the past.  RN provides additional history.  He states that patient had I be in her left forearm.  During the infusion she reported pain in her left AC and wrist.  The IV was checked multiple times for patency with good blood return and without pain when flushing or drawing back.  RN offered to start an IV in the right arm however patient declined this.  There is no visible swelling at this time.  While patient  was on her observation postinfusion she reported noticing swelling and now around the IV site.  She had mild pain localized to the area that she described as a soreness.  She rated the pain 3 out of 10 in severity.  She received Tylenol as a premed and declined the need for any pain medicine at this time.  She denies any numbness or tingling.        ROS  All other systems are reviewed and are negative for acute change except as noted in the HPI.    No Known Allergies   Past Medical History:  Diagnosis Date   Anemia    Anxiety    Asthma    Bipolar II disorder ([place])    Complication of anesthesia 2003   ASPIRATED DURING EMERGENCY CS   Depression    doing ok   Eclamptic seizure 2003   Fibroid    Genital herpes    Headache(784.0)    Herpes    History of blood clots    x3   History of blood transfusion MARCH 2013   Menometrorrhagia 07/27/2016   Migraines    Personal history of PE (pulmonary embolism) x 3, latest 02/2010   noncompliant with INR checks, must limit refills   Preeclampsia 2003   with first pregnancy   Pregnancy induced hypertension 2003   seized 2wks after   Pyelonephritis 2012   Seizures ([place])  2003   after first delivery   Suicidal ideations    Urinary tract infection      Past Surgical History:  Procedure Laterality Date   CESAREAN SECTION  12/2001 and 01/2008   CESAREAN SECTION  06/26/2012   Procedure: CESAREAN SECTION;  Surgeon: Lavonia Drafts, MD;  Location: [facility];  Service: Obstetrics;  Laterality: N/A;   INDUCED ABORTION  01/2010    Social History   Socioeconomic History   Marital status: Single    Spouse name: Not on file   Number of children: Not on file   Years of education: Not on file   Highest education level: Not on file  Occupational History   Not on file  Tobacco Use   Smoking status: Never   Smokeless tobacco: Never  Substance and Sexual Activity   Alcohol use: No   Drug use: No   Sexual activity: Yes    Birth  control/protection: Condom  Other Topics Concern   Not on file  Social History Narrative   Student. Plans on staying here in [place] (originally living in [place]). 2 daughters.   Social Determinants of Health   Financial Resource Strain: Not on file  Food Insecurity: Not on file  Transportation Needs: Not on file  Physical Activity: Not on file  Stress: Not on file  Social Connections: Not on file  Intimate Partner Violence: Not on file    Family History  Problem Relation Age of Onset   Diabetes Mother        Type 2   Anemia Sister    Hypertension Sister    Breast cancer Maternal Aunt    Anesthesia problems Neg Hx      Current Outpatient Medications:    acetaminophen (TYLENOL) 500 MG tablet, Take 1,000 mg by mouth every 6 (six) hours as needed for mild pain, moderate pain or headache., Disp: , Rfl:    acyclovir (ZOVIRAX) 200 MG capsule, Take 2 capsules (400 mg total) by mouth 2 (two) times daily., Disp: 14 capsule, Rfl: 0   albuterol (VENTOLIN HFA) 108 (90 Base) MCG/ACT inhaler, Inhale 2 puffs into the lungs every 6 (six) hours as needed for wheezing or shortness of breath., Disp: 1 each, Rfl: 6   ALPRAZolam (XANAX) 0.25 MG tablet, Take 1 tablet (0.25 mg total) by mouth 3 (three) times daily as needed for anxiety., Disp: 20 tablet, Rfl: 0   ARIPiprazole (ABILIFY) 15 MG tablet, Take 1 tablet (15 mg total) by mouth daily., Disp: 30 tablet, Rfl: 0   busPIRone (BUSPAR) 10 MG tablet, Take 1 tablet (10 mg total) by mouth 2 (two) times daily., Disp: 60 tablet, Rfl: 0   Fluocinolone Acetonide Scalp 0.01 % OIL, Apply to scalp daily as needed, wash out 8- 12 hours later, Disp: 118 mL, Rfl: 1   lurasidone (LATUDA) 40 MG TABS tablet, Take 40 mg by mouth daily., Disp: , Rfl:    Rimegepant Sulfate (NURTEC) 75 MG TBDP, Take every other day for headache prevention, Disp: 15 tablet, Rfl: 2   Semaglutide,0.25 or 0.'5MG'$ /DOS, (OZEMPIC, 0.25 OR 0.5 MG/DOSE,) 2 MG/3ML SOPN, Inject 0.5 mg into the  skin once a week., Disp: 3 mL, Rfl: 1   tiZANidine (ZANAFLEX) 4 MG tablet, Take 1 tablet (4 mg total) by mouth every 8 (eight) hours as needed for muscle spasms., Disp: 30 tablet, Rfl: 6   triamcinolone ointment (KENALOG) 0.5 %, Apply 1 Application topically 2 (two) times daily. Use as needed for eczema, do not use on face,  Disp: 60 g, Rfl: 1   Vitamin D, Ergocalciferol, (DRISDOL) 1.25 MG (50000 UNIT) CAPS capsule, Take 50,000 Units by mouth once a week., Disp: , Rfl:   PHYSICAL EXAM: ECOG FS:1 - Symptomatic but completely ambulatory  T: 97.6   BP: 114/71     HR: 88    Resp: 16    O2: 99% RA Physical Exam Vitals and nursing note reviewed.  Constitutional:      Appearance: She is well-developed. She is not ill-appearing or toxic-appearing.  HENT:     Head: Normocephalic.     Nose: Nose normal.  Eyes:     Conjunctiva/sclera: Conjunctivae normal.  Neck:     Vascular: No JVD.  Cardiovascular:     Rate and Rhythm: Normal rate and regular rhythm.     Pulses: Normal pulses.          Radial pulses are 2+ on the right side and 2+ on the left side.     Heart sounds: Normal heart sounds.  Pulmonary:     Effort: Pulmonary effort is normal.  Abdominal:     General: There is no distension.  Musculoskeletal:     Cervical back: Normal range of motion.     Comments: Full ROM of LUE. Compartments soft in LUE and neurovascularly intact.  Approximately 3 x 2 cm area of swelling on left forearm. Non tender to palpation. No overlying erythema or color change. No streaking   Skin:    General: Skin is warm and dry.     Capillary Refill: Capillary refill takes less than 2 seconds.  Neurological:     Mental Status: She is oriented to person, place, and time.        LABORATORY DATA: I have reviewed the data as listed    Latest Ref Rng & Units 10/05/2022   11:14 AM 09/19/2022    2:03 PM 05/05/2022    3:05 PM  CBC  WBC 4.0 - 10.5 K/uL 6.1  5.8  6.4   Hemoglobin 12.0 - 15.0 g/dL 11.1  10.9  11.4    Hematocrit 36.0 - 46.0 % 36.1  34.9  35.3   Platelets 150.0 - 400.0 K/uL 371.0  496  406.0         Latest Ref Rng & Units 10/05/2022   11:14 AM 05/05/2022    3:05 PM 12/08/2018   10:37 PM  CMP  Glucose 70 - 99 mg/dL 84  82  85   BUN 6 - 23 mg/dL '12  14  13   '$ Creatinine 0.40 - 1.20 mg/dL 0.88  0.93  0.91   Sodium 135 - 145 mEq/L 135  136  135   Potassium 3.5 - 5.1 mEq/L 4.3  4.1  3.6   Chloride 96 - 112 mEq/L 103  103  102   CO2 19 - 32 mEq/L '25  25  23   '$ Calcium 8.4 - 10.5 mg/dL 9.2  9.4  8.5   Total Protein 6.0 - 8.3 g/dL 7.5  7.3  7.7   Total Bilirubin 0.2 - 1.2 mg/dL 0.4  0.3  0.4   Alkaline Phos 39 - 117 U/L 58  54  43   AST 0 - 37 U/L '16  15  30   '$ ALT 0 - 35 U/L '16  14  18        '$ RADIOGRAPHIC STUDIES (from last 24 hours if applicable) I have personally reviewed the radiological images as listed and agreed with the findings in the report.  No results found.      Visit Diagnosis: 1. Iron deficiency anemia, unspecified iron deficiency anemia type   2. Pain of left upper extremity      No orders of the defined types were placed in this encounter.   All questions were answered. The patient knows to call the clinic with any problems, questions or concerns. No barriers to learning was detected.  I have spent a total of 10 minutes minutes of face-to-face and non-face-to-face time, preparing to see the patient, obtaining and/or reviewing separately obtained history, performing a medically appropriate examination, counseling and educating the patient, documenting clinical information in the electronic health record, and care coordination (communications with other health care professionals or caregivers).    Thank you for allowing me to participate in the care of this patient.    Barrie Folk, PA-C Department of Hematology/Oncology [facility] Phone: [phone]  [phone]    10/10/2022 12:47 PM

## 2022-10-11 ENCOUNTER — Telehealth: Payer: Self-pay | Admitting: Family Medicine

## 2022-10-11 ENCOUNTER — Encounter: Payer: Self-pay | Admitting: Family Medicine

## 2022-10-11 NOTE — Telephone Encounter (Signed)
Melanie with [facility] preservice called to advise that patient needs a prior authorization for CT scan to be done with [facility]. Her phone number is [phone] ext [phone]

## 2022-10-11 NOTE — Telephone Encounter (Signed)
Form printed

## 2022-10-14 ENCOUNTER — Ambulatory Visit (HOSPITAL_BASED_OUTPATIENT_CLINIC_OR_DEPARTMENT_OTHER): Payer: BC Managed Care – PPO

## 2022-10-14 ENCOUNTER — Telehealth (HOSPITAL_BASED_OUTPATIENT_CLINIC_OR_DEPARTMENT_OTHER): Payer: Self-pay

## 2022-10-15 ENCOUNTER — Ambulatory Visit (HOSPITAL_BASED_OUTPATIENT_CLINIC_OR_DEPARTMENT_OTHER): Payer: BC Managed Care – PPO

## 2022-10-15 ENCOUNTER — Ambulatory Visit (HOSPITAL_BASED_OUTPATIENT_CLINIC_OR_DEPARTMENT_OTHER): Payer: BC Managed Care – PPO | Attending: Family Medicine

## 2022-10-17 ENCOUNTER — Encounter: Payer: Self-pay | Admitting: Family Medicine

## 2022-10-19 ENCOUNTER — Telehealth (HOSPITAL_BASED_OUTPATIENT_CLINIC_OR_DEPARTMENT_OTHER): Payer: Self-pay

## 2022-10-21 ENCOUNTER — Encounter: Payer: Self-pay | Admitting: Family Medicine

## 2022-10-21 DIAGNOSIS — N10 Acute tubulo-interstitial nephritis: Secondary | ICD-10-CM

## 2022-10-21 MED ORDER — AMOXICILLIN-POT CLAVULANATE 500-125 MG PO TABS: 1.0000 | ORAL_TABLET | Freq: Two times a day (BID) | ORAL | 0 refills | Status: DC

## 2022-10-28 DIAGNOSIS — F3132 Bipolar disorder, current episode depressed, moderate: Secondary | ICD-10-CM | POA: Diagnosis not present

## 2022-10-28 DIAGNOSIS — F422 Mixed obsessional thoughts and acts: Secondary | ICD-10-CM | POA: Diagnosis not present

## 2022-10-28 DIAGNOSIS — F40298 Other specified phobia: Secondary | ICD-10-CM | POA: Diagnosis not present

## 2022-10-28 DIAGNOSIS — F411 Generalized anxiety disorder: Secondary | ICD-10-CM | POA: Diagnosis not present

## 2022-11-10 ENCOUNTER — Encounter: Payer: Self-pay | Admitting: Family Medicine

## 2022-11-10 NOTE — Telephone Encounter (Signed)
Letter printed for completion.

## 2022-11-14 ENCOUNTER — Other Ambulatory Visit: Payer: Self-pay | Admitting: Family Medicine

## 2022-11-14 ENCOUNTER — Encounter: Payer: Self-pay | Admitting: Family Medicine

## 2022-11-14 DIAGNOSIS — E669 Obesity, unspecified: Secondary | ICD-10-CM

## 2022-12-06 ENCOUNTER — Encounter: Payer: Self-pay | Admitting: Family Medicine

## 2022-12-06 DIAGNOSIS — Z13220 Encounter for screening for lipoid disorders: Secondary | ICD-10-CM

## 2022-12-06 NOTE — Telephone Encounter (Signed)
Form has been filled out and placed in folder.

## 2022-12-19 ENCOUNTER — Encounter: Payer: Self-pay | Admitting: Hematology and Oncology

## 2022-12-26 ENCOUNTER — Ambulatory Visit: Payer: BC Managed Care – PPO | Admitting: Adult Health

## 2022-12-26 ENCOUNTER — Encounter: Payer: Self-pay | Admitting: Adult Health

## 2022-12-26 NOTE — Progress Notes (Deleted)
Referring:  Darreld Mclean, MD [address] [place],  [place] [address]  PCP: Darreld Mclean, MD  Neurology was asked to evaluate Paula Benson, a 37 year old female for a chief complaint of headaches.  Our recommendations of care will be communicated by shared medical record.    CC:  headaches  History provided from self  Follow-up visit:  Prior visit: 09/14/2022 with Dr. Billey Gosling (initial consult visit)  Brief HPI:  Paula Benson is a 37 y.o. female with PMH significant for multiple PEs, asthma, iron deficiency anemia, migraines who presents for follow-up regarding daily headaches, blurred and facial pain which began around 07/2022 consistent with occipital neuralgia.   At prior visit, was started on tizanidine for pain and Nurtec every other day for headache prophylaxis. Was referred to ophthalmology for blurred vision. Recommended completion of CTV to rule out CVST d/t hx of clots. Recommended completion of CTA due to prior MRA showing moderate PCA stenosis although no apparent vascular risk factors.    Interval history:     CTA and CTV not yet completed  Ophthalmology ***    The patient presents for evaluation of headaches which began 1.5 months ago. She describes her headaches as sharp pain in her left temple which typically lasts for 4-5 minutes at a time, but can occur multiple times per day. She did have one time where the headache lasted the entire day. Headaches are associated with photophobia, phonophobia, and nausea. She currently has a headache every day. PCP recently prescribed Nurtec every other day, which she has picked up but has not yet started.  She also noticed intermittent vertigo and lightheadedness. This is sometimes associated with her headache but can occur without it as well. She has also noticed worsening blurred vision which is constant. Feels her eyes are "jumping around" at times. She will occasionally hear her heartbeat in  her ears. Saw an optometrist who recommended bifocals, and has not seen an ophthalmologist.  She has a history of migraines for several years but states her current headaches feel different.  MRI brain 09/09/22 showed abnormal T1 hypointense marrow in the calvarium and C-spine. Brain was unremarkable. She is scheduled to see her hematologist next week.  MRA showed moderate left PCA stenosis and was otherwise unremarkable. Recent A1c was 5.6 and LDL was 89.  Headache History: Onset: 1.5 months ago Triggers: none Aura: blurred vision Location: left temple, retro-orbital, occiput Quality/Description: sharp Associated Symptoms:  Photophobia: yes  Phonophobia: yes  Nausea: yes Other symptoms: dizziness Worse with activity?: yes Duration of headaches: 5 minutes-24 hours  Headache days per month: 30 Headache free days per month: 0  Current Treatment: Abortive Aleve  Preventative none  Prior Therapies                                 Nurtec every other day Imitrex 25 mg PRN  LABS: CBC    Component Value Date/Time   WBC 6.1 10/05/2022 1114   RBC 4.77 10/05/2022 1114   HGB 11.1 (L) 10/05/2022 1114   HGB 10.9 (L) 09/19/2022 1403   HCT 36.1 10/05/2022 1114   PLT 371.0 10/05/2022 1114   PLT 496 (H) 09/19/2022 1403   MCV 75.6 (L) 10/05/2022 1114   MCH 23.5 (L) 09/19/2022 1403   MCHC 30.9 10/05/2022 1114   RDW 19.0 (H) 10/05/2022 1114   LYMPHSABS 2.1 09/19/2022 1403  MONOABS 0.4 09/19/2022 1403   EOSABS 0.1 09/19/2022 1403   BASOSABS 0.1 09/19/2022 1403      Latest Ref Rng & Units 10/05/2022   11:14 AM 05/05/2022    3:05 PM 12/08/2018   10:37 PM  CMP  Glucose 70 - 99 mg/dL 84  82  85   BUN 6 - 23 mg/dL 12  14  13   $ Creatinine 0.40 - 1.20 mg/dL 0.88  0.93  0.91   Sodium 135 - 145 mEq/L 135  136  135   Potassium 3.5 - 5.1 mEq/L 4.3  4.1  3.6   Chloride 96 - 112 mEq/L 103  103  102   CO2 19 - 32 mEq/L 25  25  23   $ Calcium 8.4 - 10.5 mg/dL 9.2  9.4  8.5   Total Protein  6.0 - 8.3 g/dL 7.5  7.3  7.7   Total Bilirubin 0.2 - 1.2 mg/dL 0.4  0.3  0.4   Alkaline Phos 39 - 117 U/L 58  54  43   AST 0 - 37 U/L 16  15  30   $ ALT 0 - 35 U/L 16  14  18       $ IMAGING:  MRI brain 09/09/22: 1. Unremarkable MRI appearance of the brain. No evidence of acute intracranial abnormality. 2. Abnormal T1 hypointense marrow signal within the calvarium and visualized upper cervical spine. While this finding can reflect a marrow infiltrative process, the most common causes include chronic anemia, smoking and obesity.  MRA head 09/09/22: 1. No intracranial aneurysm is identified. 2. 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  Imaging independently reviewed on December 26, 2022   Current Outpatient Medications on File Prior to Visit  Medication Sig Dispense Refill   acetaminophen (TYLENOL) 500 MG tablet Take 1,000 mg by mouth every 6 (six) hours as needed for mild pain, moderate pain or headache.     acyclovir (ZOVIRAX) 200 MG capsule Take 2 capsules (400 mg total) by mouth 2 (two) times daily. 14 capsule 0   albuterol (VENTOLIN HFA) 108 (90 Base) MCG/ACT inhaler Inhale 2 puffs into the lungs every 6 (six) hours as needed for wheezing or shortness of breath. 1 each 6   ALPRAZolam (XANAX) 0.25 MG tablet Take 1 tablet (0.25 mg total) by mouth 3 (three) times daily as needed for anxiety. 20 tablet 0   amoxicillin-clavulanate (AUGMENTIN) 500-125 MG tablet Take 1 tablet by mouth 2 (two) times daily. 20 tablet 0   ARIPiprazole (ABILIFY) 15 MG tablet Take 1 tablet (15 mg total) by mouth daily. 30 tablet 0   busPIRone (BUSPAR) 10 MG tablet Take 1 tablet (10 mg total) by mouth 2 (two) times daily. 60 tablet 0   Fluocinolone Acetonide Scalp 0.01 % OIL Apply to scalp daily as needed, wash out 8- 12 hours later 118 mL 1   lurasidone (LATUDA) 40 MG TABS tablet Take 40 mg by mouth daily.     OZEMPIC, 0.25 OR 0.5 MG/DOSE, 2 MG/3ML SOPN DIAL AND INJECT UNDER THE  SKIN 0.5 MG WEEKLY 3 mL 1   Rimegepant Sulfate (NURTEC) 75 MG TBDP Take every other day for headache prevention 15 tablet 2   tiZANidine (ZANAFLEX) 4 MG tablet Take 1 tablet (4 mg total) by mouth every 8 (eight) hours as needed for muscle spasms. 30 tablet 6   triamcinolone ointment (KENALOG) 0.5 % Apply 1 Application topically 2 (two) times daily. Use as needed for eczema, do  not use on face 60 g 1   Vitamin D, Ergocalciferol, (DRISDOL) 1.25 MG (50000 UNIT) CAPS capsule Take 50,000 Units by mouth once a week.     No current facility-administered medications on file prior to visit.     Allergies: No Known Allergies  Family History: Migraine or other headaches in the family:  daughter has migraines Aneurysms in a first degree relative:  aunt had an aneurysm Brain tumors in the family:  no Other neurological illness in the family:   no  Past Medical History: Past Medical History:  Diagnosis Date   Anemia    Anxiety    Asthma    Bipolar II disorder ([place])    Complication of anesthesia 2003   ASPIRATED DURING EMERGENCY CS   Depression    doing ok   Eclamptic seizure 2003   Fibroid    Genital herpes    Headache(784.0)    Herpes    History of blood clots    x3   History of blood transfusion MARCH 2013   Menometrorrhagia 07/27/2016   Migraines    Personal history of PE (pulmonary embolism) x 3, latest 02/2010   noncompliant with INR checks, must limit refills   Preeclampsia 2003   with first pregnancy   Pregnancy induced hypertension 2003   seized 2wks after   Pyelonephritis 2012   Seizures ([place]) 2003   after first delivery   Suicidal ideations    Urinary tract infection     Past Surgical History Past Surgical History:  Procedure Laterality Date   CESAREAN SECTION  12/2001 and 01/2008   CESAREAN SECTION  06/26/2012   Procedure: CESAREAN SECTION;  Surgeon: Lavonia Drafts, MD;  Location: [facility];  Service: Obstetrics;  Laterality: N/A;   INDUCED ABORTION  01/2010     Social History: Social History   Tobacco Use   Smoking status: Never   Smokeless tobacco: Never  Substance Use Topics   Alcohol use: No   Drug use: No     ROS: Negative for fevers, chills. Positive for headaches. All other systems reviewed and negative unless stated otherwise in HPI.   Physical Exam:   Vital Signs: There were no vitals taken for this visit. GENERAL: well appearing,in no acute distress,alert SKIN:  Color, texture, turgor normal. No rashes or lesions HEAD:  Normocephalic/atraumatic. CV:  RRR RESP: Normal respiratory effort MSK: no tenderness to palpation over occiput, neck, or shoulders  NEUROLOGICAL: Mental Status: Alert, oriented to person, place and time,Follows commands Cranial Nerves: PERRL, no papilledema visualized, visual fields intact to confrontation, EOM with mildly limited abduction OU, facial sensation intact, no facial droop or ptosis, hearing grossly intact, no dysarthria, palate elevate symmetrically, tongue protrudes midline, shoulder shrug intact and symmetric Motor: muscle strength 5/5 both upper and lower extremities,no drift, normal tone Reflexes: 2+ throughout Sensation: intact to light touch all 4 extremities Coordination: Finger-to- nose-finger intact bilaterally Gait: normal-based   IMPRESSION: 37 year old female with a history of multiple PEs, asthma, iron deficiency anemia, migraines who presents for follow up of worsening headaches and blurred vision over since around 07/2022.  At prior visit, recommended completion of CTV to assess for sinus venous thrombosis given history of multiple blood clots and CTA to further clarify stenosis as MRA showing moderate PCA stenosis (no clear risk factors for vascular disease) but not yet completed.  Refer to ophthalmology for formal exam due to complaints of pulsatile tinnitus and mild restriction of OU abduction, no papilledema on exam. Felt sharp  occipital/retro-orbital pain consistent with  occipital neuralgia was started on tizanidine.  Started on Nurtec every other day for headache prevention.   No clear papilledema on fundus exam today, but given new pulsatile tinnitus and mild restriction of abduction OU on today's exam, will refer for formal ophthalmology exam. Will order CTV to assess for sinus venous thrombosis given her history of multiple blood clots. MRA with apparent moderate PCA stenosis, though she does not have any clear risk factors for vascular disease. CTA ordered to clarify severity of this stenosis. Her sharp occipital/retro-orbital pain sounds consistent with occipital neuralgia. Will prescribe PRN tizanidine to help with muscle tension. She will start Nurtec every other day for headache prevention and let me know if this is effective for her.  PLAN: -CTA, CT venogram -Referral to ophthalmology -Start Nurtec 75 mg every other day -Tizanidine 4 mg PRN for muscle spasms     I spent *** minutes of face-to-face and non-face-to-face time with patient.  This included previsit chart review, lab review, study review, order entry, electronic health record documentation, patient education  Paula Benson, [facility]  [facility] [address] [place], [place] [address]  Phone [phone] Fax [phone] Note: This document was prepared with digital dictation and possible smart phrase technology. Any transcriptional errors that result from this process are unintentional.

## 2023-01-09 ENCOUNTER — Encounter (INDEPENDENT_AMBULATORY_CARE_PROVIDER_SITE_OTHER): Payer: BC Managed Care – PPO | Admitting: Family Medicine

## 2023-01-09 DIAGNOSIS — A6000 Herpesviral infection of urogenital system, unspecified: Secondary | ICD-10-CM | POA: Diagnosis not present

## 2023-01-09 NOTE — Telephone Encounter (Signed)

## 2023-01-10 MED ORDER — ACYCLOVIR 200 MG PO CAPS: 400.0000 mg | ORAL_CAPSULE | Freq: Two times a day (BID) | ORAL | 3 refills | Status: DC

## 2023-01-10 NOTE — Addendum Note (Signed)
Addended by: Lamar Blinks C on: 01/10/2023 05:21 PM   Modules accepted: Orders

## 2023-01-13 ENCOUNTER — Other Ambulatory Visit: Payer: Self-pay | Admitting: Family Medicine

## 2023-01-13 DIAGNOSIS — E669 Obesity, unspecified: Secondary | ICD-10-CM

## 2023-02-05 ENCOUNTER — Encounter: Payer: Self-pay | Admitting: Hematology and Oncology

## 2023-02-05 NOTE — Progress Notes (Deleted)
[place] [facility] [address], [place], [place] [address] [phone]  Date:  02/09/2023   Name:  Paula Benson   DOB:  Dec 12, 1985   MRN:  [id number]  PCP:  Darreld Mclean, MD    Chief Complaint: No chief complaint on file.   History of Present Illness:  Paula Benson is a 37 y.o. very pleasant female patient who presents with the following:  Pt seen today with concern of UTI History of multiple pulmonary embolism, asthma, iron deficiency anemia, migraine, mood disorder  Last seen by myself in November- from that visit: ron deficiency anemia, unspecified iron deficiency anemia type - Plan: CT Abdomen Pelvis W Contrast Patient seen today for follow-up.  Tomorrow she is seeing ophthalmology and also having CTA of her head to evaluate possible vascular abnormality Her main concern today is persistent severe fatigue.  This could be related to iron deficiency anemia.  However she also relates a strong family history of sarcoidosis today.  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[place]) 12/31/2015   CAP (community acquired pneumonia) 11/02/2014   Pyelonephritis 10/31/2014   Hypokalemia 10/31/2014   Neck pain 02/05/2014   Migraine, unspecified, without mention of intractable migraine without mention of status migrainosus 08/14/2013   Palpitations 04/12/2012   Hx  of preeclampsia, prior pregnancy, currently pregnant--had eclamptic seizure 2 weeks postpartum 02/20/2012   History of aspiration pneumonitis 02/20/2012   Iron deficiency anemia 02/20/2012   Recurrent urinary tract infection 08/25/2011   Genital herpes 06/09/2011   Panic disorder 03/21/2011   History of pulmonary embolus (PE) 03/17/2011   Chronic anticoagulation 03/17/2011   ASTHMA, INTERMITTENT 03/04/2010    Past Medical History:  Diagnosis Date   Anemia    Anxiety    Asthma    Bipolar II disorder ([place])    Complication of anesthesia 2003   ASPIRATED DURING EMERGENCY CS   Depression    doing ok   Eclamptic seizure 2003   Fibroid    Genital herpes    Headache(784.0)    Herpes    History of blood clots    x3   History of blood transfusion MARCH 2013   Menometrorrhagia 07/27/2016   Migraines    Personal history of PE (pulmonary embolism) x 3, latest 02/2010   noncompliant with INR checks, must limit refills   Preeclampsia 2003   with first pregnancy   Pregnancy induced hypertension 2003   seized 2wks after   Pyelonephritis 2012   Seizures ([place]) 2003   after first delivery   Suicidal ideations    Urinary tract infection     Past Surgical History:  Procedure Laterality Date   CESAREAN SECTION  12/2001 and 01/2008   CESAREAN SECTION  06/26/2012   Procedure: CESAREAN SECTION;  Surgeon: Lavonia Drafts, MD;  Location: [facility];  Service: Obstetrics;  Laterality: N/A;   INDUCED ABORTION  01/2010    Social History   Tobacco Use   Smoking status: Never   Smokeless tobacco: Never  Substance Use Topics   Alcohol use: No   Drug use: No    Family History  Problem Relation Age of Onset   Diabetes Mother        Type 2   Anemia Sister    Hypertension Sister    Breast cancer Maternal Aunt    Anesthesia problems Neg Hx     No Known Allergies  Medication list has been reviewed and updated.  Current Outpatient Medications on File Prior to Visit  Medication Sig  Dispense Refill   acetaminophen (TYLENOL) 500 MG tablet Take 1,000 mg by mouth every 6 (six) hours as needed for mild pain, moderate pain or headache.     acyclovir (ZOVIRAX) 200 MG capsule Take 2 capsules (400 mg total) by mouth 2 (two) times daily. Use for HSV suppression.  If outbreak take 800 mg TID for 2 days 400 capsule 3   albuterol (VENTOLIN HFA) 108 (90 Base) MCG/ACT inhaler Inhale 2 puffs into the lungs every 6 (six) hours as needed for wheezing or shortness of breath. 1 each 6   ALPRAZolam (XANAX) 0.25 MG tablet Take 1 tablet (0.25 mg total) by mouth 3 (three) times daily as needed for anxiety. 20 tablet 0   amoxicillin-clavulanate (AUGMENTIN) 500-125 MG tablet Take 1 tablet by mouth 2 (two) times daily. 20 tablet 0   ARIPiprazole (ABILIFY) 15 MG tablet Take 1 tablet (15 mg total) by mouth daily. 30 tablet 0   busPIRone (BUSPAR) 10 MG tablet Take 1 tablet (10 mg total) by mouth 2 (two) times daily. 60 tablet 0   Fluocinolone Acetonide Scalp 0.01 % OIL Apply to scalp daily as needed, wash out 8- 12 hours later 118 mL 1   lurasidone (LATUDA) 40 MG TABS tablet Take 40 mg by mouth daily.     OZEMPIC, 0.25 OR 0.5 MG/DOSE, 2 MG/3ML SOPN DIAL AND INJECT UNDER THE SKIN 0.5 MG WEEKLY 3 mL 1   Rimegepant Sulfate (NURTEC) 75 MG TBDP Take every other day for headache prevention 15 tablet 2   tiZANidine (ZANAFLEX) 4 MG tablet Take 1 tablet (4 mg total) by mouth every 8 (eight) hours as needed for muscle spasms. 30 tablet 6   triamcinolone ointment (KENALOG) 0.5 % Apply 1 Application topically 2 (two) times daily. Use as needed for eczema, do not use on face 60 g 1   Vitamin D, Ergocalciferol, (DRISDOL) 1.25 MG (50000 UNIT) CAPS capsule Take 50,000 Units by mouth once a week.     No current facility-administered medications on file prior to visit.    Review of Systems:  As per HPI- otherwise negative.   Physical Examination: There were no vitals filed for this visit. There were no vitals filed  for this visit. There is no height or weight on file to calculate BMI. Ideal Body Weight:    GEN: no acute distress. HEENT: Atraumatic, Normocephalic.  Ears and Nose: No external deformity. CV: RRR, No M/G/R. No JVD. No thrill. No extra heart sounds. PULM: CTA B, no wheezes, crackles, rhonchi. No retractions. No resp. distress. No accessory muscle use. ABD: S, NT, ND, +BS. No rebound. No HSM. EXTR: No c/c/e PSYCH: Normally interactive. Conversant.    Assessment and Plan: ***  Signed Lamar Blinks, MD

## 2023-02-06 ENCOUNTER — Other Ambulatory Visit (INDEPENDENT_AMBULATORY_CARE_PROVIDER_SITE_OTHER): Payer: Medicaid Other

## 2023-02-06 ENCOUNTER — Encounter: Payer: Self-pay | Admitting: Family Medicine

## 2023-02-06 DIAGNOSIS — R300 Dysuria: Secondary | ICD-10-CM | POA: Diagnosis not present

## 2023-02-06 LAB — POCT URINALYSIS DIP (MANUAL ENTRY)
Bilirubin, UA: NEGATIVE
Glucose, UA: NEGATIVE mg/dL
Ketones, POC UA: NEGATIVE mg/dL
Nitrite, UA: NEGATIVE
Spec Grav, UA: 1.01 (ref 1.010–1.025)
Urobilinogen, UA: 0.2 E.U./dL
pH, UA: 5 (ref 5.0–8.0)

## 2023-02-06 LAB — URINALYSIS, MICROSCOPIC ONLY

## 2023-02-06 MED ORDER — CEPHALEXIN 500 MG PO CAPS: 500.0000 mg | ORAL_CAPSULE | Freq: Two times a day (BID) | ORAL | 0 refills | Status: DC

## 2023-02-06 NOTE — Telephone Encounter (Signed)
Okay for urine drop off?

## 2023-02-06 NOTE — Addendum Note (Signed)
Addended by: Lamar Blinks C on: 02/06/2023 03:01 PM   Modules accepted: Orders

## 2023-02-08 ENCOUNTER — Encounter: Payer: Self-pay | Admitting: Family Medicine

## 2023-02-08 LAB — URINE CULTURE
MICRO NUMBER:: 14644516
SPECIMEN QUALITY:: ADEQUATE

## 2023-02-08 MED ORDER — FLUCONAZOLE 150 MG PO TABS: 150.0000 mg | ORAL_TABLET | Freq: Once | ORAL | 0 refills | Status: AC

## 2023-02-09 ENCOUNTER — Ambulatory Visit: Payer: Medicaid Other | Admitting: Family Medicine

## 2023-02-22 ENCOUNTER — Encounter: Payer: Self-pay | Admitting: Hematology and Oncology

## 2023-02-24 ENCOUNTER — Ambulatory Visit (INDEPENDENT_AMBULATORY_CARE_PROVIDER_SITE_OTHER): Payer: No Typology Code available for payment source | Admitting: Family Medicine

## 2023-02-24 ENCOUNTER — Encounter: Payer: Self-pay | Admitting: Family Medicine

## 2023-02-24 VITALS — BP 129/83 | HR 92 | Temp 98.6°F | Resp 18 | Ht 63.0 in | Wt 188.6 lb

## 2023-02-24 DIAGNOSIS — R399 Unspecified symptoms and signs involving the genitourinary system: Secondary | ICD-10-CM | POA: Diagnosis not present

## 2023-02-24 LAB — POCT URINALYSIS DIPSTICK
Bilirubin, UA: NEGATIVE
Glucose, UA: NEGATIVE
Ketones, UA: NEGATIVE
Nitrite, UA: NEGATIVE
Protein, UA: POSITIVE — AB
Spec Grav, UA: 1.025 (ref 1.010–1.025)
Urobilinogen, UA: 0.2 E.U./dL
pH, UA: 6 (ref 5.0–8.0)

## 2023-02-24 LAB — URINALYSIS, MICROSCOPIC ONLY

## 2023-02-24 MED ORDER — AMOXICILLIN-POT CLAVULANATE 875-125 MG PO TABS: 1.0000 | ORAL_TABLET | Freq: Two times a day (BID) | ORAL | 0 refills | Status: AC

## 2023-02-24 MED ORDER — PHENAZOPYRIDINE HCL 200 MG PO TABS: 200.0000 mg | ORAL_TABLET | Freq: Three times a day (TID) | ORAL | 0 refills | Status: DC | PRN

## 2023-02-24 NOTE — Progress Notes (Signed)
Acute Office Visit  Subjective:     Patient ID: Paula Benson, female    DOB: 15-Nov-1986, 37 y.o.   MRN: [id number]  CC: urinary symptoms   HPI Patient is in today for urinary symptoms.    About a month ago went to UC and for urinary symptoms, but UA was normal. Symptoms persisted and PCP had her bring a urine sample for culture - she was started on Keflex but it made her sick (vomiting), but she did finish it. Symptoms were starting to ease up, but within 2 days of stopping, they started up again and are now worse than they were originally. Three days ago when wiping, she noticed pink-tinge, but it was not time for her cycle. LMP about 2 weeks ago. Symptoms have included: suprapubic pressure, dysuria, hematuria, urgency, frequency.  No vaginal symptoms - she had taken Diflucan with the Keflex to prevent yeast infection..     ROS All review of systems negative except what is listed in the HPI      Objective:    BP 129/83 (BP Location: Left Arm, Patient Position: Sitting, Cuff Size: Large)   Pulse 92   Temp 98.6 F (37 C) (Oral)   Resp 18   Ht 5\' 3"  (1.6 m)   Wt 188 lb 9.6 oz (85.5 kg)   SpO2 99%   BMI 33.41 kg/m    Physical Exam Vitals reviewed.  Constitutional:      Appearance: Normal appearance.  Cardiovascular:     Rate and Rhythm: Normal rate and regular rhythm.     Pulses: Normal pulses.     Heart sounds: Normal heart sounds.  Pulmonary:     Effort: Pulmonary effort is normal.     Breath sounds: Normal breath sounds.  Abdominal:     General: There is no distension.     Tenderness: There is no abdominal tenderness. There is no right CVA tenderness, left CVA tenderness or guarding.  Skin:    General: Skin is warm and dry.  Neurological:     Mental Status: She is alert and oriented to person, place, and time.  Psychiatric:        Mood and Affect: Mood normal.        Behavior: Behavior normal.        Thought Content: Thought content normal.         Judgment: Judgment normal.     Results for orders placed or performed in visit on 02/24/23  POCT Urinalysis Dipstick  Result Value Ref Range   Color, UA yellow    Clarity, UA cloudy    Glucose, UA Negative Negative   Bilirubin, UA negative    Ketones, UA negative    Spec Grav, UA 1.025 1.010 - 1.025   Blood, UA moderate    pH, UA 6.0 5.0 - 8.0   Protein, UA Positive (A) Negative   Urobilinogen, UA 0.2 0.2 or 1.0 E.U./dL   Nitrite, UA negative    Leukocytes, UA Large (3+) (A) Negative   Appearance cloudy    Odor none         Assessment & Plan:   Problem List Items Addressed This Visit   None Visit Diagnoses     UTI symptoms    -  Primary Urine is still abnormal. I will start you on a new antibiotic for now and we will see if we have to make any changes when the culture comes back. There was some blood; sometimes we can  see this with infection, kidney stones, etc. - if you develop any severe pain, we can do a scan for kidney stone. Otherwise, let's treat the infection and then we will have you repeat a urine sample in about 3-4 weeks to make sure the blood is gone. Make sure you are staying well hydrated.  Also adding pyridium for the discomfort - this will turn your urine orange.    Relevant Medications   amoxicillin-clavulanate (AUGMENTIN) 875-125 MG tablet   phenazopyridine (PYRIDIUM) 200 MG tablet   Other Relevant Orders   Urine Microscopic   POCT Urinalysis Dipstick (Completed)   Urine Culture       Meds ordered this encounter  Medications   amoxicillin-clavulanate (AUGMENTIN) 875-125 MG tablet    Sig: Take 1 tablet by mouth 2 (two) times daily for 7 days.    Dispense:  14 tablet    Refill:  0    Order Specific Question:   Supervising Provider    Answer:   Penni Homans A [[id number]]   phenazopyridine (PYRIDIUM) 200 MG tablet    Sig: Take 1 tablet (200 mg total) by mouth 3 (three) times daily as needed for pain.    Dispense:  10 tablet    Refill:  0    Order  Specific Question:   Supervising Provider    Answer:   Penni Homans A [[id number]]    Return if symptoms worsen or fail to improve.  Terrilyn Saver, NP

## 2023-02-24 NOTE — Patient Instructions (Addendum)
Urine is still abnormal. I will start you on a new antibiotic for now and we will see if we have to make any changes when the culture comes back. There was some blood; sometimes we can see this with infection, kidney stones, etc. - if you develop any severe pain, we can do a scan for kidney stone. Otherwise, let's treat the infection and then we will have you repeat a urine sample in about 3-4 weeks to make sure the blood is gone. Make sure you are staying well hydrated.  Also adding pyridium for the discomfort - this will turn your urine orange.

## 2023-02-26 LAB — URINE CULTURE
MICRO NUMBER:: 14729401
SPECIMEN QUALITY:: ADEQUATE

## 2023-02-28 ENCOUNTER — Encounter: Payer: Self-pay | Admitting: Family Medicine

## 2023-03-06 ENCOUNTER — Ambulatory Visit: Payer: Medicaid Other | Admitting: Family Medicine

## 2023-03-09 ENCOUNTER — Encounter: Payer: Self-pay | Admitting: Family Medicine

## 2023-03-09 DIAGNOSIS — L309 Dermatitis, unspecified: Secondary | ICD-10-CM

## 2023-03-09 MED ORDER — TRIAMCINOLONE ACETONIDE 0.5 % EX OINT: 1.0000 | TOPICAL_OINTMENT | Freq: Two times a day (BID) | CUTANEOUS | 1 refills | Status: DC

## 2023-03-17 ENCOUNTER — Encounter: Payer: Self-pay | Admitting: Family Medicine

## 2023-03-20 NOTE — Progress Notes (Addendum)
[facility] [address], [address] [place], [place] [address] [phone]  Date:  03/22/2023   Name:  Paula Benson   DOB:  May 28, 1986   MRN:  [id number]  PCP:  Pearline Cables, MD    Chief Complaint: Abdominal Pain (Pt says she feels better overall but when she uses the restroom the end of her stream still hurts. )   History of Present Illness:  Paula Benson is a 37 y.o. very pleasant female patient who presents with the following:  Patient seen today for possible urinary tract infection Most recent visit with myself was in November, although we have exchanged quite a few MyChart messages since that time  History of multiple pulmonary embolism, asthma, iron deficiency anemia, migraine, mood disorder At her most recent visit in November she complained of persistent fatigue, and also had recently learned of a strong family history of sarcoidosis.  She also had a possible lymph node over her clavicle. I ordered a CT chest, abdomen pelvis at that time She was receiving iron infusions for severe anemia and was in the process of getting a CT angiogram and venogram per neurology She did not complete any of the above-mentioned imaging studies as of yet  We did get a urine culture in March for concern of urinary tract infection, she grew strep which should have responded to Keflex. Pt notes this made her feel sick so she was changed over to augmentin She finished the augmentin without any issues  She does note some vaginal - "irritated just a little bit" of mild discharge She will use boric acid vaginal suppositories as needed I offered to do a vaginal exam today, patient declines.  She states no new sexual partners  She works at a call center and request some paperwork completed today allowing for more frequent bathroom visits  She notes a normal menstrual period within the last 30 days Patient Active Problem List    Diagnosis Date Noted   Menometrorrhagia 07/27/2016   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[facility]) 12/31/2015   CAP (community acquired pneumonia) 11/02/2014   Pyelonephritis 10/31/2014   Hypokalemia 10/31/2014   Neck pain 02/05/2014   Migraine, unspecified, without mention of intractable migraine without mention of status migrainosus 08/14/2013   Palpitations 04/12/2012   Hx of preeclampsia, prior pregnancy, currently pregnant--had eclamptic seizure 2 weeks postpartum 02/20/2012   History of aspiration pneumonitis 02/20/2012   Iron deficiency anemia 02/20/2012   Recurrent urinary tract infection 08/25/2011   Genital herpes 06/09/2011   Panic disorder 03/21/2011   History of pulmonary embolus (PE) 03/17/2011   Chronic anticoagulation 03/17/2011   ASTHMA, INTERMITTENT 03/04/2010    Past Medical History:  Diagnosis Date   Anemia    Anxiety    Asthma    Bipolar II disorder ([facility])    Complication of anesthesia 2003   ASPIRATED DURING EMERGENCY CS   Depression    doing ok   Eclamptic seizure 2003   Fibroid    Genital herpes    Headache(784.0)    Herpes    History of blood clots    x3   History of blood transfusion MARCH 2013   Menometrorrhagia 07/27/2016   Migraines    Personal history of PE (pulmonary embolism) x 3, latest 02/2010   noncompliant with INR checks, must limit refills   Preeclampsia 2003   with first pregnancy   Pregnancy induced hypertension 2003   seized 2wks after  Pyelonephritis 2012   Seizures ([facility]) 2003   after first delivery   Suicidal ideations    Urinary tract infection     Past Surgical History:  Procedure Laterality Date   CESAREAN SECTION  12/2001 and 01/2008   CESAREAN SECTION  06/26/2012   Procedure: CESAREAN SECTION;  Surgeon: Willodean Rosenthal, MD;  Location: [facility];  Service: Obstetrics;  Laterality: N/A;   INDUCED ABORTION  01/2010    Social History   Tobacco Use   Smoking status: Never   Smokeless tobacco: Never  Substance Use Topics    Alcohol use: No   Drug use: No    Family History  Problem Relation Age of Onset   Diabetes Mother        Type 2   Anemia Sister    Hypertension Sister    Breast cancer Maternal Aunt    Anesthesia problems Neg Hx     No Known Allergies  Medication list has been reviewed and updated.  Current Outpatient Medications on File Prior to Visit  Medication Sig Dispense Refill   acetaminophen (TYLENOL) 500 MG tablet Take 1,000 mg by mouth every 6 (six) hours as needed for mild pain, moderate pain or headache.     acyclovir (ZOVIRAX) 200 MG capsule Take 2 capsules (400 mg total) by mouth 2 (two) times daily. Use for HSV suppression.  If outbreak take 800 mg TID for 2 days 400 capsule 3   albuterol (VENTOLIN HFA) 108 (90 Base) MCG/ACT inhaler Inhale 2 puffs into the lungs every 6 (six) hours as needed for wheezing or shortness of breath. 1 each 6   ALPRAZolam (XANAX) 0.25 MG tablet Take 1 tablet (0.25 mg total) by mouth 3 (three) times daily as needed for anxiety. 20 tablet 0   ARIPiprazole (ABILIFY) 15 MG tablet Take 1 tablet (15 mg total) by mouth daily. 30 tablet 0   busPIRone (BUSPAR) 10 MG tablet Take 1 tablet (10 mg total) by mouth 2 (two) times daily. 60 tablet 0   Fluocinolone Acetonide Scalp 0.01 % OIL Apply to scalp daily as needed, wash out 8- 12 hours later 118 mL 1   lurasidone (LATUDA) 40 MG TABS tablet Take 40 mg by mouth daily.     OZEMPIC, 0.25 OR 0.5 MG/DOSE, 2 MG/3ML SOPN DIAL AND INJECT UNDER THE SKIN 0.5 MG WEEKLY 3 mL 1   phenazopyridine (PYRIDIUM) 200 MG tablet Take 1 tablet (200 mg total) by mouth 3 (three) times daily as needed for pain. 10 tablet 0   Rimegepant Sulfate (NURTEC) 75 MG TBDP Take every other day for headache prevention 15 tablet 2   tiZANidine (ZANAFLEX) 4 MG tablet Take 1 tablet (4 mg total) by mouth every 8 (eight) hours as needed for muscle spasms. 30 tablet 6   triamcinolone ointment (KENALOG) 0.5 % Apply 1 Application topically 2 (two) times daily.  Use as needed for eczema, do not use on face 60 g 1   Vitamin D, Ergocalciferol, (DRISDOL) 1.25 MG (50000 UNIT) CAPS capsule Take 50,000 Units by mouth once a week.     No current facility-administered medications on file prior to visit.    Review of Systems:  As per HPI- otherwise negative.   Physical Examination: Vitals:   03/22/23 1517  BP: 122/82  Pulse: 77  Resp: 18  Temp: 97.9 F (36.6 C)  SpO2: 97%   Vitals:   03/22/23 1517  Weight: 192 lb (87.1 kg)  Height:  (1.6 m)   Body mass  index is 34.01 kg/m. Ideal Body Weight: Weight in (lb) to have BMI = 25: 140.8  GEN: no acute distress.  Obese, looks well  HEENT: Atraumatic, Normocephalic.  Ears and Nose: No external deformity. CV: RRR, No M/G/R. No JVD. No thrill. No extra heart sounds. PULM: CTA B, no wheezes, crackles, rhonchi. No retractions. No resp. distress. No accessory muscle use. ABD: S, NT, ND, +BS. No rebound. No HSM.  Benign belly, no CVA tenderness EXTR: No c/c/e PSYCH: Normally interactive. Conversant.  Results for orders placed or performed in visit on 03/22/23  Urine Culture   Specimen: Urine  Result Value Ref Range   MICRO NUMBER: [id number]    SPECIMEN QUALITY: Adequate    Sample Source URINE    STATUS: FINAL    ISOLATE 1: Klebsiella pneumoniae (A)       Susceptibility   Klebsiella pneumoniae - URINE CULTURE, REFLEX    AMOX/CLAVULANIC <=2 Sensitive     AMPICILLIN >=32 Resistant     AMPICILLIN/SULBACTAM 4 Sensitive     CEFAZOLIN* <=4 Not Reportable      * For infections other than uncomplicated UTI caused by E. coli, K. pneumoniae or P. mirabilis: Cefazolin is resistant if MIC > or = 8 mcg/mL. (Distinguishing susceptible versus intermediate for isolates with MIC < or = 4 mcg/mL requires additional testing.) For uncomplicated UTI caused by E. coli, K. pneumoniae or P. mirabilis: Cefazolin is susceptible if MIC <32 mcg/mL and predicts susceptible to the oral agents cefaclor,  cefdinir, cefpodoxime, cefprozil, cefuroxime, cephalexin and loracarbef.     CEFTAZIDIME <=1 Sensitive     CEFEPIME <=1 Sensitive     CEFTRIAXONE <=1 Sensitive     CIPROFLOXACIN <=0.25 Sensitive     LEVOFLOXACIN <=0.12 Sensitive     GENTAMICIN <=1 Sensitive     IMIPENEM <=0.25 Sensitive     NITROFURANTOIN 128 Resistant     PIP/TAZO <=4 Sensitive     TOBRAMYCIN <=1 Sensitive     TRIMETH/SULFA* <=20 Sensitive      * For infections other than uncomplicated UTI caused by E. coli, K. pneumoniae or P. mirabilis: Cefazolin is resistant if MIC > or = 8 mcg/mL. (Distinguishing susceptible versus intermediate for isolates with MIC < or = 4 mcg/mL requires additional testing.) For uncomplicated UTI caused by E. coli, K. pneumoniae or P. mirabilis: Cefazolin is susceptible if MIC <32 mcg/mL and predicts susceptible to the oral agents cefaclor, cefdinir, cefpodoxime, cefprozil, cefuroxime, cephalexin and loracarbef. Legend: S = Susceptible  I = Intermediate R = Resistant  NS = Not susceptible * = Not tested  NR = Not reported **NN = See antimicrobic comments   POCT urinalysis dipstick  Result Value Ref Range   Color, UA yellow yellow   Clarity, UA cloudy (A) clear   Glucose, UA negative negative mg/dL   Bilirubin, UA negative negative   Ketones, POC UA negative negative mg/dL   Spec Grav, UA 9.562 1.308 - 1.025   Blood, UA negative negative   pH, UA 6.5 5.0 - 8.0   Protein Ur, POC negative negative mg/dL   Urobilinogen, UA 0.2 0.2 or 1.0 E.U./dL   Nitrite, UA Negative Negative   Leukocytes, UA Trace (A) Negative     Assessment and Plan: Dysuria - Plan: Urine Culture, POCT urinalysis dipstick, Ambulatory referral to Urology  Urinary frequency  Pt seen today with concern of possible persistent UTI  Dipstick appears fairly benign, will obtain a culture today.  Patient also relates history of frequent UTIs since  childhood, she reports she has not been evaluated by urologist.  I  made a referral to urology to look for any other contributing factors Will plan further follow- up pending labs. Completed paperwork for her job today  Signed Abbe Amsterdam, MD  Addnd 4/20- received urine culture which is positive for infection Sent in keflex- message to pt  Results for orders placed or performed in visit on 03/22/23  Urine Culture   Specimen: Urine  Result Value Ref Range   MICRO NUMBER: [id number]    SPECIMEN QUALITY: Adequate    Sample Source URINE    STATUS: FINAL    ISOLATE 1: Klebsiella pneumoniae (A)       Susceptibility   Klebsiella pneumoniae - URINE CULTURE, REFLEX    AMOX/CLAVULANIC <=2 Sensitive     AMPICILLIN >=32 Resistant     AMPICILLIN/SULBACTAM 4 Sensitive     CEFAZOLIN* <=4 Not Reportable      * For infections other than uncomplicated UTI caused by E. coli, K. pneumoniae or P. mirabilis: Cefazolin is resistant if MIC > or = 8 mcg/mL. (Distinguishing susceptible versus intermediate for isolates with MIC < or = 4 mcg/mL requires additional testing.) For uncomplicated UTI caused by E. coli, K. pneumoniae or P. mirabilis: Cefazolin is susceptible if MIC <32 mcg/mL and predicts susceptible to the oral agents cefaclor, cefdinir, cefpodoxime, cefprozil, cefuroxime, cephalexin and loracarbef.     CEFTAZIDIME <=1 Sensitive     CEFEPIME <=1 Sensitive     CEFTRIAXONE <=1 Sensitive     CIPROFLOXACIN <=0.25 Sensitive     LEVOFLOXACIN <=0.12 Sensitive     GENTAMICIN <=1 Sensitive     IMIPENEM <=0.25 Sensitive     NITROFURANTOIN 128 Resistant     PIP/TAZO <=4 Sensitive     TOBRAMYCIN <=1 Sensitive     TRIMETH/SULFA* <=20 Sensitive      * For infections other than uncomplicated UTI caused by E. coli, K. pneumoniae or P. mirabilis: Cefazolin is resistant if MIC > or = 8 mcg/mL. (Distinguishing susceptible versus intermediate for isolates with MIC < or = 4 mcg/mL requires additional testing.) For uncomplicated UTI caused by E. coli, K.  pneumoniae or P. mirabilis: Cefazolin is susceptible if MIC <32 mcg/mL and predicts susceptible to the oral agents cefaclor, cefdinir, cefpodoxime, cefprozil, cefuroxime, cephalexin and loracarbef. Legend: S = Susceptible  I = Intermediate R = Resistant  NS = Not susceptible * = Not tested  NR = Not reported **NN = See antimicrobic comments   POCT urinalysis dipstick  Result Value Ref Range   Color, UA yellow yellow   Clarity, UA cloudy (A) clear   Glucose, UA negative negative mg/dL   Bilirubin, UA negative negative   Ketones, POC UA negative negative mg/dL   Spec Grav, UA 5.409 8.119 - 1.025   Blood, UA negative negative   pH, UA 6.5 5.0 - 8.0   Protein Ur, POC negative negative mg/dL   Urobilinogen, UA 0.2 0.2 or 1.0 E.U./dL   Nitrite, UA Negative Negative   Leukocytes, UA Trace (A) Negative

## 2023-03-22 ENCOUNTER — Ambulatory Visit: Payer: No Typology Code available for payment source | Admitting: Family Medicine

## 2023-03-22 ENCOUNTER — Encounter: Payer: Self-pay | Admitting: Family Medicine

## 2023-03-22 VITALS — BP 122/82 | HR 77 | Temp 97.9°F | Resp 18 | Ht 63.0 in | Wt 192.0 lb

## 2023-03-22 DIAGNOSIS — R300 Dysuria: Secondary | ICD-10-CM

## 2023-03-22 DIAGNOSIS — R350 Frequency of micturition: Secondary | ICD-10-CM

## 2023-03-22 LAB — POCT URINALYSIS DIP (MANUAL ENTRY)
Bilirubin, UA: NEGATIVE
Blood, UA: NEGATIVE
Glucose, UA: NEGATIVE mg/dL
Ketones, POC UA: NEGATIVE mg/dL
Nitrite, UA: NEGATIVE
Protein Ur, POC: NEGATIVE mg/dL
Spec Grav, UA: 1.01 (ref 1.010–1.025)
Urobilinogen, UA: 0.2 E.U./dL
pH, UA: 6.5 (ref 5.0–8.0)

## 2023-03-25 ENCOUNTER — Encounter: Payer: Self-pay | Admitting: Family Medicine

## 2023-03-25 LAB — URINE CULTURE
MICRO NUMBER:: 14843113
SPECIMEN QUALITY:: ADEQUATE

## 2023-03-25 MED ORDER — SULFAMETHOXAZOLE-TRIMETHOPRIM 800-160 MG PO TABS: 1.0000 | ORAL_TABLET | Freq: Two times a day (BID) | ORAL | 0 refills | Status: DC

## 2023-03-25 MED ORDER — CEPHALEXIN 500 MG PO CAPS: 500.0000 mg | ORAL_CAPSULE | Freq: Two times a day (BID) | ORAL | 0 refills | Status: DC

## 2023-03-25 NOTE — Addendum Note (Signed)
Addended by: Abbe Amsterdam C on: 03/25/2023 02:41 PM   Modules accepted: Orders

## 2023-04-04 ENCOUNTER — Encounter: Payer: No Typology Code available for payment source | Admitting: Urology

## 2023-04-17 ENCOUNTER — Encounter: Payer: Self-pay | Admitting: Family Medicine

## 2023-04-17 DIAGNOSIS — A6000 Herpesviral infection of urogenital system, unspecified: Secondary | ICD-10-CM

## 2023-04-17 MED ORDER — ACYCLOVIR 200 MG PO CAPS: 400.0000 mg | ORAL_CAPSULE | Freq: Two times a day (BID) | ORAL | 3 refills | Status: DC

## 2023-04-18 ENCOUNTER — Encounter: Payer: No Typology Code available for payment source | Admitting: Urology

## 2023-06-24 NOTE — Progress Notes (Unsigned)
[facility] [address], [address] [place], [place] [address] [phone]  Date:  06/28/2023   Name:  Paula Benson   DOB:  1986-04-08   MRN:  [id number]  PCP:  Pearline Cables, MD    Chief Complaint: No chief complaint on file.   History of Present Illness:  Paula Benson is a 37 y.o. very pleasant female patient who presents with the following:  Patient seen today with concern about her iron level and also possible UTI Most recent visit with myself was in April History of multiple pulmonary embolism, asthma, iron deficiency anemia, migraine, mood disorder   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HCC) 12/31/2015   CAP (community acquired pneumonia) 11/02/2014   Pyelonephritis 10/31/2014   Hypokalemia 10/31/2014   Neck pain 02/05/2014   Migraine, unspecified, without mention of intractable migraine without mention of status migrainosus 08/14/2013   Palpitations 04/12/2012   Hx of preeclampsia, prior pregnancy, currently pregnant--had eclamptic seizure 2 weeks postpartum 02/20/2012   History of aspiration pneumonitis 02/20/2012   Iron deficiency anemia 02/20/2012   Recurrent urinary tract infection 08/25/2011   Genital herpes 06/09/2011   Panic disorder 03/21/2011   History of pulmonary embolus (PE) 03/17/2011   Chronic anticoagulation 03/17/2011   ASTHMA, INTERMITTENT 03/04/2010    Past Medical History:  Diagnosis Date   Anemia    Anxiety    Asthma    Bipolar II disorder (HCC)    Complication of anesthesia 2003   ASPIRATED DURING EMERGENCY CS   Depression    doing ok   Eclamptic seizure 2003   Fibroid    Genital herpes    Headache(784.0)    Herpes    History of blood clots    x3   History of blood transfusion MARCH 2013   Menometrorrhagia 07/27/2016   Migraines    Personal history of PE (pulmonary embolism) x 3, latest  02/2010   noncompliant with INR checks, must limit refills   Preeclampsia 2003   with first pregnancy   Pregnancy induced hypertension 2003   seized 2wks after   Pyelonephritis 2012   Seizures (HCC) 2003   after first delivery   Suicidal ideations    Urinary tract infection     Past Surgical History:  Procedure Laterality Date   CESAREAN SECTION  12/2001 and 01/2008   CESAREAN SECTION  06/26/2012   Procedure: CESAREAN SECTION;  Surgeon: Willodean Rosenthal, MD;  Location: [facility];  Service: Obstetrics;  Laterality: N/A;   INDUCED ABORTION  01/2010    Social History   Tobacco Use   Smoking status: Never   Smokeless tobacco: Never  Substance Use Topics   Alcohol use: No   Drug use: No    Family History  Problem Relation Age of Onset   Diabetes Mother        Type 2   Anemia Sister    Hypertension Sister    Breast cancer Maternal Aunt    Anesthesia problems Neg Hx     No Known Allergies  Medication list has been reviewed and updated.  Current Outpatient Medications on File Prior to Visit  Medication Sig Dispense Refill   acetaminophen (TYLENOL) 500 MG tablet Take 1,000 mg by mouth every 6 (six) hours as needed for mild pain, moderate pain or headache.     acyclovir (ZOVIRAX) 200 MG capsule Take 2 capsules (  400 mg total) by mouth 2 (two) times daily. Use for HSV suppression.  If outbreak take 800 mg TID for 2 days 400 capsule 3   albuterol (VENTOLIN HFA) 108 (90 Base) MCG/ACT inhaler Inhale 2 puffs into the lungs every 6 (six) hours as needed for wheezing or shortness of breath. 1 each 6   ALPRAZolam (XANAX) 0.25 MG tablet Take 1 tablet (0.25 mg total) by mouth 3 (three) times daily as needed for anxiety. 20 tablet 0   ARIPiprazole (ABILIFY) 15 MG tablet Take 1 tablet (15 mg total) by mouth daily. 30 tablet 0   busPIRone (BUSPAR) 10 MG tablet Take 1 tablet (10 mg total) by mouth 2 (two) times daily. 60 tablet 0   cephALEXin (KEFLEX) 500 MG capsule Take 1 capsule (500 mg  total) by mouth 2 (two) times daily. 14 capsule 0   Fluocinolone Acetonide Scalp 0.01 % OIL Apply to scalp daily as needed, wash out 8- 12 hours later 118 mL 1   lurasidone (LATUDA) 40 MG TABS tablet Take 40 mg by mouth daily.     OZEMPIC, 0.25 OR 0.5 MG/DOSE, 2 MG/3ML SOPN DIAL AND INJECT UNDER THE SKIN 0.5 MG WEEKLY 3 mL 1   phenazopyridine (PYRIDIUM) 200 MG tablet Take 1 tablet (200 mg total) by mouth 3 (three) times daily as needed for pain. 10 tablet 0   Rimegepant Sulfate (NURTEC) 75 MG TBDP Take every other day for headache prevention 15 tablet 2   sulfamethoxazole-trimethoprim (BACTRIM DS) 800-160 MG tablet Take 1 tablet by mouth 2 (two) times daily. 10 tablet 0   tiZANidine (ZANAFLEX) 4 MG tablet Take 1 tablet (4 mg total) by mouth every 8 (eight) hours as needed for muscle spasms. 30 tablet 6   triamcinolone ointment (KENALOG) 0.5 % Apply 1 Application topically 2 (two) times daily. Use as needed for eczema, do not use on face 60 g 1   Vitamin D, Ergocalciferol, (DRISDOL) 1.25 MG (50000 UNIT) CAPS capsule Take 50,000 Units by mouth once a week.     No current facility-administered medications on file prior to visit.    Review of Systems:  As per HPI- otherwise negative.   Physical Examination: There were no vitals filed for this visit. There were no vitals filed for this visit. There is no height or weight on file to calculate BMI. Ideal Body Weight:    GEN: no acute distress. HEENT: Atraumatic, Normocephalic.  Ears and Nose: No external deformity. CV: RRR, No M/G/R. No JVD. No thrill. No extra heart sounds. PULM: CTA B, no wheezes, crackles, rhonchi. No retractions. No resp. distress. No accessory muscle use. ABD: S, NT, ND, +BS. No rebound. No HSM. EXTR: No c/c/e PSYCH: Normally interactive. Conversant.    Assessment and Plan: ***  Signed Abbe Amsterdam, MD

## 2023-06-28 ENCOUNTER — Telehealth: Payer: Self-pay

## 2023-06-28 ENCOUNTER — Ambulatory Visit (INDEPENDENT_AMBULATORY_CARE_PROVIDER_SITE_OTHER): Payer: No Typology Code available for payment source | Admitting: Family Medicine

## 2023-06-28 VITALS — BP 124/60 | HR 85 | Temp 98.0°F | Resp 18 | Ht 63.0 in | Wt 184.4 lb

## 2023-06-28 DIAGNOSIS — G4452 New daily persistent headache (NDPH): Secondary | ICD-10-CM

## 2023-06-28 DIAGNOSIS — R399 Unspecified symptoms and signs involving the genitourinary system: Secondary | ICD-10-CM | POA: Diagnosis not present

## 2023-06-28 DIAGNOSIS — E611 Iron deficiency: Secondary | ICD-10-CM | POA: Diagnosis not present

## 2023-06-28 MED ORDER — NURTEC 75 MG PO TBDP: ORAL_TABLET | ORAL | 2 refills | Status: DC

## 2023-06-28 NOTE — Patient Instructions (Signed)
It was good to see you today, I will be in touch with your labs soon as possible.  We suspect you may be low on iron again, in which case we can treat as needed Perhaps try taking the Nurtec every other day for about 2 weeks, then you can use it just as needed

## 2023-06-28 NOTE — Telephone Encounter (Signed)
PA initiated via Covermymeds; KEY: [id number]. Awaiting determination.

## 2023-06-28 NOTE — Telephone Encounter (Signed)
Plan is going to require her to try and fail    TWO of the following prophylactic therapies? (1) amitriptyline (Elavil), (2) candesartan (Atacand), (3) divalproex sodium (Depakote/Depakote ER), (4) topiramate (Topamax), (5) venlafaxine (Effexor/Effexor XR), (6) one of the following beta-blockers: atenolol; metoprolol; nadolol; propranolol; or timolol*

## 2023-06-29 ENCOUNTER — Encounter: Payer: Self-pay | Admitting: Family Medicine

## 2023-06-29 LAB — IRON: Iron: 26 ug/dL — ABNORMAL LOW (ref 42–145)

## 2023-06-29 LAB — CBC
HCT: 34.4 % — ABNORMAL LOW (ref 36.0–46.0)
Hemoglobin: 10.6 g/dL — ABNORMAL LOW (ref 12.0–15.0)
MCHC: 30.8 g/dL (ref 30.0–36.0)
MCV: 79.1 fl (ref 78.0–100.0)
RBC: 4.35 Mil/uL (ref 3.87–5.11)
RDW: 16.7 % — ABNORMAL HIGH (ref 11.5–15.5)

## 2023-06-29 LAB — COMPREHENSIVE METABOLIC PANEL
ALT: 12 U/L (ref 0–35)
AST: 15 U/L (ref 0–37)
Albumin: 4 g/dL (ref 3.5–5.2)
Alkaline Phosphatase: 54 U/L (ref 39–117)
BUN: 14 mg/dL (ref 6–23)
CO2: 26 mEq/L (ref 19–32)
Calcium: 9.1 mg/dL (ref 8.4–10.5)
Chloride: 104 mEq/L (ref 96–112)
Creatinine, Ser: 0.85 mg/dL (ref 0.40–1.20)
GFR: 86.24 mL/min (ref >60.00)
Glucose, Bld: 103 mg/dL — ABNORMAL HIGH (ref 70–99)
Potassium: 3.8 mEq/L (ref 3.5–5.1)
Sodium: 138 mEq/L (ref 135–145)
Total Bilirubin: 0.3 mg/dL (ref 0.2–1.2)
Total Protein: 6.9 g/dL (ref 6.0–8.3)

## 2023-06-29 LAB — FERRITIN: Ferritin: 3.6 ng/mL — ABNORMAL LOW (ref 10.0–291.0)

## 2023-06-29 NOTE — Telephone Encounter (Signed)
PA sent, awaiting determination.

## 2023-06-29 NOTE — Telephone Encounter (Signed)
PA approved.   Request Reference Number: [id number]. NURTEC TAB 75MG  ODT is approved through 06/28/2024. Your patient may now fill this prescription and it will be covered. Authorization Expiration Date: 06/28/2024

## 2023-06-29 NOTE — Telephone Encounter (Signed)
Copland, Gwenlyn Found, MD  You11 hours ago (8:36 PM)    Thank you for working on this [facility]  Can you let them know, I am not able to try  (1) amitriptyline (Elavil), (2) candesartan (Atacand), (3) divalproex sodium (Depakote/Depakote ER), (4) topiramate (Topamax), (5) venlafaxine (Effexor/Effexor XR) Due to patient's current treatment regimen for mental health.  She is already on several medications which should not be combined with the above suggestions. I can try a beta blocker but will not be able to fulfill their requirement to try this plus one of the other medications listed above. Also, due to depression beta blocker may be inappropriate for her anyway.  She was on nurtec in the recent past and did well, stopped using it due to her headaches going away :(

## 2023-06-30 ENCOUNTER — Telehealth: Payer: Self-pay

## 2023-06-30 ENCOUNTER — Inpatient Hospital Stay
Payer: No Typology Code available for payment source | Attending: Hematology and Oncology | Admitting: Hematology and Oncology

## 2023-06-30 DIAGNOSIS — D509 Iron deficiency anemia, unspecified: Secondary | ICD-10-CM | POA: Diagnosis not present

## 2023-06-30 NOTE — Assessment & Plan Note (Signed)
Chronic iron deficiency anemia:  (She had a prior history of multiple PEs and had seen Dr. Myna Hidalgo previously)   IV iron: March 2023, October 2023  Labs  02/06/2022: Ferritin 2.6, TIBC 669, there was no hemoglobin available to review. 02/17/2022: Hemoglobin 7.9, MCV 66, RDW 21.2, platelets 593 06/28/23: Hemoglobin: 10.6, Ferritin: 3.6  Will refer to GI. Probably malabsorption. Will administer 3 doses of IV iron.  Recheck labs in 3 months and telephone visit 2 days after to discuss results.

## 2023-06-30 NOTE — Progress Notes (Signed)
HEMATOLOGY-ONCOLOGY TELEPHONE VISIT PROGRESS NOTE  I connected with our patient on 06/30/23 at 11:45 AM EDT by telephone and verified that I am speaking with the correct person using two identifiers.  I discussed the limitations, risks, security and privacy concerns of performing an evaluation and management service by telephone and the availability of in person appointments.  I also discussed with the patient that there may be a patient responsible charge related to this service. The patient expressed understanding and agreed to proceed.   History of Present Illness: Follow-up regarding iron deficiency anemia    REVIEW OF SYSTEMS:   Constitutional: Denies fevers, chills or abnormal weight loss All other systems were reviewed with the patient and are negative. Observations/Objective:     Assessment Plan:  Iron deficiency anemia Chronic iron deficiency anemia:  (She had a prior history of multiple PEs and had seen Dr. Myna Hidalgo previously)   IV iron: March 2023, October 2023  Labs  02/06/2022: Ferritin 2.6, TIBC 669, there was no hemoglobin available to review. 02/17/2022: Hemoglobin 7.9, MCV 66, RDW 21.2, platelets 593 06/28/23: Hemoglobin: 10.6, Ferritin: 3.6  Will refer to GI. Probably malabsorption. Will administer 3 doses of IV iron.  Recheck labs in 3 months and telephone visit 2 days after to discuss results.    I discussed the assessment and treatment plan with the patient. The patient was provided an opportunity to ask questions and all were answered. The patient agreed with the plan and demonstrated an understanding of the instructions. The patient was advised to call back or seek an in-person evaluation if the symptoms worsen or if the condition fails to improve as anticipated.   I provided 12 minutes of non-face-to-face time during this encounter.  This includes time for charting and coordination of care   Tamsen Meek, MD  I Janan Ridge am acting as a scribe for  Dr.Trinika Cortese  I have reviewed the above documentation for accuracy and completeness, and I agree with the above.

## 2023-06-30 NOTE — Telephone Encounter (Signed)
Attempted to call pt as we received a message from Dr [name] regarding pt's most recent labs and pt's need for iron. LVM for pt to call back to discuss and set up phone visit with MD to make a plan for IV iron.

## 2023-07-03 ENCOUNTER — Encounter: Payer: Self-pay | Admitting: Hematology and Oncology

## 2023-07-04 ENCOUNTER — Telehealth: Payer: Self-pay | Admitting: Hematology and Oncology

## 2023-07-04 ENCOUNTER — Encounter: Payer: Self-pay | Admitting: Family Medicine

## 2023-07-04 ENCOUNTER — Telehealth: Payer: Self-pay

## 2023-07-04 ENCOUNTER — Other Ambulatory Visit: Payer: Self-pay

## 2023-07-04 DIAGNOSIS — Z7901 Long term (current) use of anticoagulants: Secondary | ICD-10-CM

## 2023-07-04 DIAGNOSIS — D509 Iron deficiency anemia, unspecified: Secondary | ICD-10-CM

## 2023-07-04 NOTE — Progress Notes (Signed)
Referral to GI entered per MD and faxed to [facility] [phone]. Fax confirmation received.

## 2023-07-04 NOTE — Telephone Encounter (Signed)
Scheduled appointments per 7/30 staff message. Patient is aware of the made appointments.

## 2023-07-04 NOTE — Telephone Encounter (Signed)
Should I ask her to schedule today? But should it be virtual?

## 2023-07-04 NOTE — Telephone Encounter (Signed)
S/w pt regarding MyChart message. She reports she is experiencing a great deal of pain, states her bilaterl hands are swollen and joints are stiff. 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 She has multiple sites of unexplained bruising. Loris is scheduled for Iron 8/2 and 8/9 as well. She will see MD 8/9 at 0800 then go for iron infusion. She was encouraged to reach out to Dr [name] regarding her new onset of pain.

## 2023-07-04 NOTE — Progress Notes (Unsigned)
[facility] [address], [address] [place], [place] [address] [phone]  Date:  07/05/2023   Name:  Paula Benson   DOB:  [birth year]   MRN:  [id number]  PCP:  Pearline Cables, MD    Chief Complaint: swelling in the finger (UTI sxs, pain/)   History of Present Illness:  Paula Benson is a 37 y.o. very pleasant female patient who presents with the following:  Patient seen today with concern of pain in her body and signs I saw her most recently just about 1 week ago-at that time her main concern was possible symptoms of recurrent iron deficiency History of multiple pulmonary embolism, asthma, iron deficiency anemia, migraine, mood disorder  She notes she may bruise easily on her legs She is also having some right knee pain She wonders if her iron may be low- she is craving ice again She is getting migraine HA again- she was on Nurtec but stopped using it as her HA were better -she would be interested in going back on Nurtec Her most recent iron infusion was in November   We checked her lab work and hemoglobin 10.6, 3.6 She has already been to see hematology, appointment 7/26; they ordered a GI referral to rule out any occult blood loss and plan for 3 IV iron infusions She was able to get her nurtec filled for headache control  In the interim she contacted with some new concerns so we made this appointment: Hello Dr. [name], I have noticed that I have bruises all over my legs and on small parts of my arms. The picture of the bigger bruise is just from scratching my leg after a mosquito bit me in a matter of hours. Honestly, I have also noticed other small things like a few of my finger nails being extremely sore and the skin lifting up almost detached from the nails. Honestly not sure if those are symptoms of Anemia but just really bothering me.  I have spoken to Dr [name] and waiting to be scheduled for 3 rounds of  infusions and he's referring me out to a Gastroenterologist to make sure I'm not bleeding internally. I am also having issues with a UTI again and uncomfortable.  I am in extreme pain and in Order for me to take care of everything to avoid losing my job I need to go in short term disability. Can you either call me or can I schedule a virtual visit ASAP? I am in excruciating pain.   She has noted swelling of her right long finger - it hurt last week, but then noted it "swelling" over the last 2 days She is not aware of any injury or other cause She notes that her fingernails feel tender She has also noted that she will get a bruise if she scratches her skin such as with a mosquito bite  She has mostly noted bruises on her right leg  No fevers  She is getting her first iron infusion on Friday   She wonders about getting short-term disability for work as she does not have any vacation or sick time available and has not been at this job long enough for [employer].  She would like to go out on STD while she gets her iron infusions and deals with her other symptoms She will get me this paperwork  She notes she had to stop seeing her therapist for a period of  time due to her new job  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HCC) 12/31/2015   CAP (community acquired pneumonia) 11/02/2014   Pyelonephritis 10/31/2014   Hypokalemia 10/31/2014   Neck pain 02/05/2014   Migraine, unspecified, without mention of intractable migraine without mention of status migrainosus 08/14/2013   Palpitations 04/12/2012   Hx of preeclampsia, prior pregnancy, currently pregnant--had eclamptic seizure 2 weeks postpartum 02/20/2012   History of aspiration pneumonitis 02/20/2012   Iron deficiency anemia 02/20/2012   Recurrent urinary tract infection 08/25/2011   Genital herpes 06/09/2011   Panic disorder 03/21/2011   History of pulmonary embolus (PE)  03/17/2011   Chronic anticoagulation 03/17/2011   ASTHMA, INTERMITTENT 03/04/2010    Past Medical History:  Diagnosis Date   Anemia    Anxiety    Asthma    Bipolar II disorder (HCC)    Complication of anesthesia 2003   ASPIRATED DURING EMERGENCY CS   Depression    doing ok   Eclamptic seizure 2003   Fibroid    Genital herpes    Headache(784.0)    Herpes    History of blood clots    x3   History of blood transfusion MARCH 2013   Menometrorrhagia 07/27/2016   Migraines    Personal history of PE (pulmonary embolism) x 3, latest 02/2010   noncompliant with INR checks, must limit refills   Preeclampsia 2003   with first pregnancy   Pregnancy induced hypertension 2003   seized 2wks after   Pyelonephritis 2012   Seizures (HCC) 2003   after first delivery   Suicidal ideations    Urinary tract infection     Past Surgical History:  Procedure Laterality Date   CESAREAN SECTION  12/2001 and 01/2008   CESAREAN SECTION  06/26/2012   Procedure: CESAREAN SECTION;  Surgeon: Willodean Rosenthal, MD;  Location: [facility];  Service: Obstetrics;  Laterality: N/A;   INDUCED ABORTION  01/2010    Social History   Tobacco Use   Smoking status: Never   Smokeless tobacco: Never  Substance Use Topics   Alcohol use: No   Drug use: No    Family History  Problem Relation Age of Onset   Diabetes Mother        Type 2   Anemia Sister    Hypertension Sister    Breast cancer Maternal Aunt    Anesthesia problems Neg Hx     No Known Allergies  Medication list has been reviewed and updated.  Current Outpatient Medications on File Prior to Visit  Medication Sig Dispense Refill   acetaminophen (TYLENOL) 500 MG tablet Take 1,000 mg by mouth every 6 (six) hours as needed for mild pain, moderate pain or headache.     acyclovir (ZOVIRAX) 200 MG capsule Take 2 capsules (400 mg total) by mouth 2 (two) times daily. Use for HSV suppression.  If outbreak take 800 mg TID for 2 days 400 capsule 3    albuterol (VENTOLIN HFA) 108 (90 Base) MCG/ACT inhaler Inhale 2 puffs into the lungs every 6 (six) hours as needed for wheezing or shortness of breath. 1 each 6   ALPRAZolam (XANAX) 0.25 MG tablet Take 1 tablet (0.25 mg total) by mouth 3 (three) times daily as needed for anxiety. 20 tablet 0   ARIPiprazole (ABILIFY) 15 MG tablet Take 1 tablet (15 mg total) by mouth daily. 30 tablet 0   busPIRone (BUSPAR) 10 MG tablet Take 1  tablet (10 mg total) by mouth 2 (two) times daily. 60 tablet 0   Fluocinolone Acetonide Scalp 0.01 % OIL Apply to scalp daily as needed, wash out 8- 12 hours later 118 mL 1   lurasidone (LATUDA) 40 MG TABS tablet Take 40 mg by mouth daily.     OZEMPIC, 0.25 OR 0.5 MG/DOSE, 2 MG/3ML SOPN DIAL AND INJECT UNDER THE SKIN 0.5 MG WEEKLY 3 mL 1   Rimegepant Sulfate (NURTEC) 75 MG TBDP Take every other day for headache prevention 15 tablet 2   tiZANidine (ZANAFLEX) 4 MG tablet Take 1 tablet (4 mg total) by mouth every 8 (eight) hours as needed for muscle spasms. 30 tablet 6   triamcinolone ointment (KENALOG) 0.5 % Apply 1 Application topically 2 (two) times daily. Use as needed for eczema, do not use on face 60 g 1   Vitamin D, Ergocalciferol, (DRISDOL) 1.25 MG (50000 UNIT) CAPS capsule Take 50,000 Units by mouth once a week.     No current facility-administered medications on file prior to visit.    Review of Systems:  As per HPI- otherwise negative.   Physical Examination: Vitals:   07/05/23 0809  BP: 132/80  Pulse: 90  Resp: 18  Temp: 98.3 F (36.8 C)  SpO2: 98%   Vitals:   07/05/23 0809  Weight: 183 lb 6.4 oz (83.2 kg)  Height: 5\' 3"  (1.6 m)   Body mass index is 32.49 kg/m. Ideal Body Weight: Weight in (lb) to have BMI = 25: 140.8  GEN: no acute distress.  Mildly obese, looks well HEENT: Atraumatic, Normocephalic.  Ears and Nose: No external deformity. CV: RRR, No M/G/R. No JVD. No thrill. No extra heart sounds. PULM: CTA B, no wheezes, crackles, rhonchi. No  retractions. No resp. distress. No accessory muscle use.Marland Kitchen EXTR: No c/c/e PSYCH: Normally interactive. Conversant.  There is minimal swelling of the right long finger, no particular redness or heat is noted.  Patient endorses tenderness with palpation of her fingernails; of note, she is wearing long decorative acrylic nails over her natural nails I am not able to appreciate any redness or other nailbed abnormality There are a few scattered discrete bruises on her right leg, do not appear pathologic       Assessment and Plan: Finger swelling - Plan: Sedimentation rate, C-reactive protein, Uric acid, cephALEXin (KEFLEX) 500 MG capsule, Rheumatoid Factor, Antinuclear Antib (ANA)  Urinary urgency - Plan: Urine Culture  Easy bruising - Plan: Pathologist smear review  Patient seen today with a few concerns.  She notes her right long finger seems to be swollen and tender.  I am not able to appreciate a definite cause, swelling is minimal.  However, we will do lab work as above and will have her take Keflex 3 times daily for 5 days in case this is a developing cellulitis  Urine culture pending, patient notes recurrent urinary frequency and dysuria  She is undergoing treatment per hematology for iron deficiency anemia.  They have referred her to GI to make sure no other occult blood loss She notes concern of easy bruising, I will check a pathology smear Advised patient I can complete short-term disability paperwork when it arrives  Signed Abbe Amsterdam, MD  Received labs as below, message to pt  Results for orders placed or performed in visit on 07/05/23  Sedimentation rate  Result Value Ref Range   Sed Rate 32 (H) 0 - 20 mm/hr  C-reactive protein  Result Value Ref Range  CRP <1.0 0.5 - 20.0 mg/dL  Uric acid  Result Value Ref Range   Uric Acid, Serum 3.9 2.4 - 7.0 mg/dL

## 2023-07-05 ENCOUNTER — Ambulatory Visit (INDEPENDENT_AMBULATORY_CARE_PROVIDER_SITE_OTHER): Payer: No Typology Code available for payment source | Admitting: Family Medicine

## 2023-07-05 ENCOUNTER — Encounter: Payer: Self-pay | Admitting: Family Medicine

## 2023-07-05 VITALS — BP 132/80 | HR 90 | Temp 98.3°F | Resp 18 | Ht 63.0 in | Wt 183.4 lb

## 2023-07-05 DIAGNOSIS — R3915 Urgency of urination: Secondary | ICD-10-CM

## 2023-07-05 DIAGNOSIS — L309 Dermatitis, unspecified: Secondary | ICD-10-CM

## 2023-07-05 DIAGNOSIS — M7989 Other specified soft tissue disorders: Secondary | ICD-10-CM

## 2023-07-05 DIAGNOSIS — R233 Spontaneous ecchymoses: Secondary | ICD-10-CM

## 2023-07-05 LAB — URIC ACID: Uric Acid, Serum: 3.9 mg/dL (ref 2.4–7.0)

## 2023-07-05 LAB — SEDIMENTATION RATE: Sed Rate: 32 mm/hr — ABNORMAL HIGH (ref 0–20)

## 2023-07-05 LAB — C-REACTIVE PROTEIN: CRP: <1 mg/dL (ref 0.5–20.0)

## 2023-07-05 MED ORDER — CEPHALEXIN 500 MG PO CAPS: 500.0000 mg | ORAL_CAPSULE | Freq: Three times a day (TID) | ORAL | 0 refills | Status: DC

## 2023-07-05 MED ORDER — TRIAMCINOLONE ACETONIDE 0.5 % EX OINT: 1.0000 | TOPICAL_OINTMENT | Freq: Two times a day (BID) | CUTANEOUS | 1 refills | Status: DC

## 2023-07-05 NOTE — Patient Instructions (Addendum)
I will be in touch in with your labs asap Let's treat your finger with keflex three times a day for 5 days I will take care of your short term disability when I get the paperwork in  Let me know if anything changes!

## 2023-07-07 ENCOUNTER — Encounter: Payer: Self-pay | Admitting: Family Medicine

## 2023-07-07 ENCOUNTER — Other Ambulatory Visit: Payer: Self-pay

## 2023-07-07 ENCOUNTER — Telehealth: Payer: Self-pay | Admitting: *Deleted

## 2023-07-07 ENCOUNTER — Other Ambulatory Visit: Payer: Self-pay | Admitting: *Deleted

## 2023-07-07 ENCOUNTER — Inpatient Hospital Stay: Payer: No Typology Code available for payment source | Attending: Hematology and Oncology

## 2023-07-07 VITALS — BP 115/80 | HR 69 | Temp 97.9°F | Resp 18

## 2023-07-07 DIAGNOSIS — Z79899 Other long term (current) drug therapy: Secondary | ICD-10-CM | POA: Diagnosis not present

## 2023-07-07 DIAGNOSIS — Z79624 Long term (current) use of inhibitors of nucleotide synthesis: Secondary | ICD-10-CM | POA: Diagnosis not present

## 2023-07-07 DIAGNOSIS — D691 Qualitative platelet defects: Secondary | ICD-10-CM | POA: Diagnosis not present

## 2023-07-07 DIAGNOSIS — D509 Iron deficiency anemia, unspecified: Secondary | ICD-10-CM | POA: Insufficient documentation

## 2023-07-07 DIAGNOSIS — R233 Spontaneous ecchymoses: Secondary | ICD-10-CM

## 2023-07-07 MED ORDER — NITROFURANTOIN MONOHYD MACRO 100 MG PO CAPS: 100.0000 mg | ORAL_CAPSULE | Freq: Two times a day (BID) | ORAL | 0 refills | Status: DC

## 2023-07-07 MED ORDER — SODIUM CHLORIDE 0.9 % IV SOLN
300.0000 mg | Freq: Once | INTRAVENOUS | Status: AC
  Administered 2023-07-07: 300 mg via INTRAVENOUS
  Filled 2023-07-07: qty 300

## 2023-07-07 MED ORDER — DIPHENHYDRAMINE HCL 50 MG/ML IJ SOLN
25.0000 mg | Freq: Once | INTRAMUSCULAR | Status: AC
  Administered 2023-07-07: 25 mg via INTRAVENOUS
  Filled 2023-07-07: qty 1

## 2023-07-07 MED ORDER — SODIUM CHLORIDE 0.9 % IV SOLN: Freq: Once | INTRAVENOUS | Status: AC

## 2023-07-07 NOTE — Telephone Encounter (Signed)
Pt needs to come back in for lab draw.  She stated that she may come in today for recollect.

## 2023-07-07 NOTE — Patient Instructions (Signed)
Iron Sucrose Injection What is this medication? IRON SUCROSE (EYE ern SOO krose) treats low levels of iron (iron deficiency anemia) in people with kidney disease. Iron is a mineral that plays an important role in making red blood cells, which carry oxygen from your lungs to the rest of your body. This medicine may be used for other purposes; ask your health care provider or pharmacist if you have questions. COMMON BRAND NAME(S): Venofer What should I tell my care team before I take this medication? They need to know if you have any of these conditions: Anemia not caused by low iron levels Heart disease High levels of iron in the blood Kidney disease Liver disease An unusual or allergic reaction to iron, other medications, foods, dyes, or preservatives Pregnant or trying to get pregnant Breastfeeding How should I use this medication? This medication is for infusion into a vein. It is given in a hospital or clinic setting. Talk to your care team about the use of this medication in children. While this medication may be prescribed for children as young as 2 years for selected conditions, precautions do apply. Overdosage: If you think you have taken too much of this medicine contact a poison control center or emergency room at once. NOTE: This medicine is only for you. Do not share this medicine with others. What if I miss a dose? Keep appointments for follow-up doses. It is important not to miss your dose. Call your care team if you are unable to keep an appointment. What may interact with this medication? Do not take this medication with any of the following: Deferoxamine Dimercaprol Other iron products This medication may also interact with the following: Chloramphenicol Deferasirox This list may not describe all possible interactions. Give your health care provider a list of all the medicines, herbs, non-prescription drugs, or dietary supplements you use. Also tell them if you smoke,  drink alcohol, or use illegal drugs. Some items may interact with your medicine. What should I watch for while using this medication? Visit your care team regularly. Tell your care team if your symptoms do not start to get better or if they get worse. You may need blood work done while you are taking this medication. You may need to follow a special diet. Talk to your care team. Foods that contain iron include: whole grains/cereals, dried fruits, beans, or peas, leafy green vegetables, and organ meats (liver, kidney). What side effects may I notice from receiving this medication? Side effects that you should report to your care team as soon as possible: Allergic reactions--skin rash, itching, hives, swelling of the face, lips, tongue, or throat Low blood pressure--dizziness, feeling faint or lightheaded, blurry vision Shortness of breath Side effects that usually do not require medical attention (report to your care team if they continue or are bothersome): Flushing Headache Joint pain Muscle pain Nausea Pain, redness, or irritation at injection site This list may not describe all possible side effects. Call your doctor for medical advice about side effects. You may report side effects to FDA at 1-800-FDA-1088. Where should I keep my medication? This medication is given in a hospital or clinic. It will not be stored at home. NOTE: This sheet is a summary. It may not cover all possible information. If you have questions about this medicine, talk to your doctor, pharmacist, or health care provider.  2024 Elsevier/Gold Standard (2023-04-28 00:00:00)

## 2023-07-10 ENCOUNTER — Encounter: Payer: Self-pay | Admitting: *Deleted

## 2023-07-11 NOTE — Progress Notes (Signed)
Patient Care Team: Copland, Gwenlyn Found, MD as PCP - General (Family Medicine)  DIAGNOSIS:  Encounter Diagnosis  Name Primary?   Iron deficiency anemia, unspecified iron deficiency anemia type Yes      CHIEF COMPLIANT: Chronic iron deficiency anemia   INTERVAL HISTORY: Paula Benson is a 37 y.o. female is here because of extensive history of iron deficiency anemia. She presents to the clinic today for follow-up.  She is here today for second iron infusion and she tolerated the iron infusions fairly well.  She is complaining of easy bruising which has been unexplained for several years.   ALLERGIES:  has No Known Allergies.  MEDICATIONS:  Current Outpatient Medications  Medication Sig Dispense Refill   acetaminophen (TYLENOL) 500 MG tablet Take 1,000 mg by mouth every 6 (six) hours as needed for mild pain, moderate pain or headache.     acyclovir (ZOVIRAX) 200 MG capsule Take 2 capsules (400 mg total) by mouth 2 (two) times daily. Use for HSV suppression.  If outbreak take 800 mg TID for 2 days 400 capsule 3   albuterol (VENTOLIN HFA) 108 (90 Base) MCG/ACT inhaler Inhale 2 puffs into the lungs every 6 (six) hours as needed for wheezing or shortness of breath. 1 each 6   ALPRAZolam (XANAX) 0.25 MG tablet Take 1 tablet (0.25 mg total) by mouth 3 (three) times daily as needed for anxiety. 20 tablet 0   ARIPiprazole (ABILIFY) 15 MG tablet Take 1 tablet (15 mg total) by mouth daily. 30 tablet 0   busPIRone (BUSPAR) 10 MG tablet Take 1 tablet (10 mg total) by mouth 2 (two) times daily. 60 tablet 0   cephALEXin (KEFLEX) 500 MG capsule Take 1 capsule (500 mg total) by mouth 3 (three) times daily. 15 capsule 0   Fluocinolone Acetonide Scalp 0.01 % OIL Apply to scalp daily as needed, wash out 8- 12 hours later 118 mL 1   lurasidone (LATUDA) 40 MG TABS tablet Take 40 mg by mouth daily.     nitrofurantoin, macrocrystal-monohydrate, (MACROBID) 100 MG capsule Take 1 capsule (100 mg total) by mouth  2 (two) times daily. 14 capsule 0   OZEMPIC, 0.25 OR 0.5 MG/DOSE, 2 MG/3ML SOPN DIAL AND INJECT UNDER THE SKIN 0.5 MG WEEKLY 3 mL 1   Rimegepant Sulfate (NURTEC) 75 MG TBDP Take every other day for headache prevention 15 tablet 2   tiZANidine (ZANAFLEX) 4 MG tablet Take 1 tablet (4 mg total) by mouth every 8 (eight) hours as needed for muscle spasms. 30 tablet 6   triamcinolone ointment (KENALOG) 0.5 % Apply 1 Application topically 2 (two) times daily. Use as needed for eczema, do not use on face 60 g 1   Vitamin D, Ergocalciferol, (DRISDOL) 1.25 MG (50000 UNIT) CAPS capsule Take 50,000 Units by mouth once a week.     No current facility-administered medications for this visit.    PHYSICAL EXAMINATION: ECOG PERFORMANCE STATUS: 1 - Symptomatic but completely ambulatory  There were no vitals filed for this visit. There were no vitals filed for this visit.    LABORATORY DATA:  I have reviewed the data as listed    Latest Ref Rng & Units 06/28/2023    4:18 PM 10/05/2022   11:14 AM 05/05/2022    3:05 PM  CMP  Glucose 70 - 99 mg/dL 161  84  82   BUN 6 - 23 mg/dL 14  12  14    Creatinine 0.40 - 1.20 mg/dL 0.96  0.45  0.93   Sodium 135 - 145 mEq/L 138  135  136   Potassium 3.5 - 5.1 mEq/L 3.8  4.3  4.1   Chloride 96 - 112 mEq/L 104  103  103   CO2 19 - 32 mEq/L 26  25  25    Calcium 8.4 - 10.5 mg/dL 9.1  9.2  9.4   Total Protein 6.0 - 8.3 g/dL 6.9  7.5  7.3   Total Bilirubin 0.2 - 1.2 mg/dL 0.3  0.4  0.3   Alkaline Phos 39 - 117 U/L 54  58  54   AST 0 - 37 U/L 15  16  15    ALT 0 - 35 U/L 12  16  14      Lab Results  Component Value Date   WBC 7.6 06/28/2023   HGB 10.6 (L) 06/28/2023   HCT 34.4 (L) 06/28/2023   MCV 79.1 06/28/2023   PLT 497.0 (H) 06/28/2023   NEUTROABS 3.2 09/19/2022    ASSESSMENT & PLAN:  Iron deficiency anemia Chronic iron deficiency anemia:   (She had a prior history of multiple PEs and had seen Dr. Myna Hidalgo previously)   IV iron: March 2023, October 2023,  07/07/2023   Labs  02/06/2022: Ferritin 2.6, TIBC 669, there was no hemoglobin available to review. 02/17/2022: Hemoglobin 7.9, MCV 66, RDW 21.2, platelets 593 06/28/23: Hemoglobin: 10.6, Ferritin: 3.6  Difficulty performing tasks with pain in the hands, unexplained bruising Easy bruising: I discussed with the patient potential causes of easy bruising most related to platelet dysfunction. I would like to send for platelet function assay along with von Willebrand factor panel and factor VIII levels. If all of those tests come back negative we might need to consider electron microscopy to evaluate for delta granule storage pool disease.  Patient is frustrated because there is no clear-cut etiology behind her recurrent blood clots as well as the easy bruising.  Telephone visit after these results are available to discuss  No orders of the defined types were placed in this encounter.  The patient has a good understanding of the overall plan. she agrees with it. she will call with any problems that may develop before the next visit here. Total time spent: 30 mins including face to face time and time spent for planning, charting and co-ordination of care   Tamsen Meek, MD 07/14/23    I Janan Ridge am acting as a Neurosurgeon for [employer]  I have reviewed the above documentation for accuracy and completeness, and I agree with the above.

## 2023-07-12 ENCOUNTER — Encounter: Payer: Self-pay | Admitting: Gastroenterology

## 2023-07-14 ENCOUNTER — Ambulatory Visit: Payer: No Typology Code available for payment source

## 2023-07-14 ENCOUNTER — Other Ambulatory Visit: Payer: Self-pay

## 2023-07-14 ENCOUNTER — Inpatient Hospital Stay: Payer: No Typology Code available for payment source

## 2023-07-14 ENCOUNTER — Inpatient Hospital Stay (HOSPITAL_BASED_OUTPATIENT_CLINIC_OR_DEPARTMENT_OTHER): Payer: No Typology Code available for payment source | Admitting: Hematology and Oncology

## 2023-07-14 VITALS — BP 139/85 | HR 88 | Temp 97.3°F | Resp 18 | Ht 63.0 in | Wt 179.2 lb

## 2023-07-14 VITALS — BP 127/76 | HR 70 | Resp 18

## 2023-07-14 DIAGNOSIS — R233 Spontaneous ecchymoses: Secondary | ICD-10-CM

## 2023-07-14 DIAGNOSIS — D509 Iron deficiency anemia, unspecified: Secondary | ICD-10-CM | POA: Diagnosis not present

## 2023-07-14 LAB — CBC WITH DIFFERENTIAL (CANCER CENTER ONLY)
Abs Immature Granulocytes: 0.02 10*3/uL (ref 0.00–0.07)
Basophils Absolute: 0.1 10*3/uL (ref 0.0–0.1)
Basophils Relative: 1 %
Eosinophils Absolute: 0.2 10*3/uL (ref 0.0–0.5)
Eosinophils Relative: 3 %
HCT: 32.7 % — ABNORMAL LOW (ref 36.0–46.0)
Hemoglobin: 10.2 g/dL — ABNORMAL LOW (ref 12.0–15.0)
Immature Granulocytes: 0 %
Lymphocytes Relative: 25 %
Lymphs Abs: 1.7 10*3/uL (ref 0.7–4.0)
MCH: 25 pg — ABNORMAL LOW (ref 26.0–34.0)
MCHC: 31.2 g/dL (ref 30.0–36.0)
MCV: 80.1 fL (ref 80.0–100.0)
Monocytes Absolute: 0.4 10*3/uL (ref 0.1–1.0)
Monocytes Relative: 7 %
Neutro Abs: 4.2 10*3/uL (ref 1.7–7.7)
Neutrophils Relative %: 64 %
Platelet Count: 506 10*3/uL — ABNORMAL HIGH (ref 150–400)
RBC: 4.08 MIL/uL (ref 3.87–5.11)
RDW: 18.8 % — ABNORMAL HIGH (ref 11.5–15.5)
WBC Count: 6.6 10*3/uL (ref 4.0–10.5)
nRBC: 0 % (ref 0.0–0.2)

## 2023-07-14 LAB — PLATELET FUNCTION ASSAY: Collagen / Epinephrine: 193 seconds (ref 0–193)

## 2023-07-14 MED ORDER — DIPHENHYDRAMINE HCL 50 MG/ML IJ SOLN
25.0000 mg | Freq: Once | INTRAMUSCULAR | Status: AC
  Administered 2023-07-14: 25 mg via INTRAVENOUS
  Filled 2023-07-14: qty 1

## 2023-07-14 MED ORDER — SODIUM CHLORIDE 0.9 % IV SOLN
300.0000 mg | Freq: Once | INTRAVENOUS | Status: AC
  Administered 2023-07-14: 300 mg via INTRAVENOUS
  Filled 2023-07-14: qty 300

## 2023-07-14 MED ORDER — SODIUM CHLORIDE 0.9 % IV SOLN: Freq: Once | INTRAVENOUS | Status: AC

## 2023-07-14 NOTE — Assessment & Plan Note (Signed)
Chronic iron deficiency anemia:   (She had a prior history of multiple PEs and had seen Dr. Myna Hidalgo previously)   IV iron: March 2023, October 2023, 07/07/2023   Labs  02/06/2022: Ferritin 2.6, TIBC 669, there was no hemoglobin available to review. 02/17/2022: Hemoglobin 7.9, MCV 66, RDW 21.2, platelets 593 06/28/23: Hemoglobin: 10.6, Ferritin: 3.6  Difficulty performing tasks with pain in the hands, unexplained bruising

## 2023-07-14 NOTE — Progress Notes (Signed)
 Pt observed for 30 minutes post Venofer infusion. Pt tolerated Tx well w/out incident. VSS at discharge.  Ambulatory to lobby.

## 2023-07-14 NOTE — Patient Instructions (Signed)
 Iron Sucrose Injection What is this medication? IRON SUCROSE (EYE ern SOO krose) treats low levels of iron (iron deficiency anemia) in people with kidney disease. Iron is a mineral that plays an important role in making red blood cells, which carry oxygen from your lungs to the rest of your body. This medicine may be used for other purposes; ask your health care provider or pharmacist if you have questions. COMMON BRAND NAME(S): Venofer What should I tell my care team before I take this medication? They need to know if you have any of these conditions: Anemia not caused by low iron levels Heart disease High levels of iron in the blood Kidney disease Liver disease An unusual or allergic reaction to iron, other medications, foods, dyes, or preservatives Pregnant or trying to get pregnant Breastfeeding How should I use this medication? This medication is for infusion into a vein. It is given in a hospital or clinic setting. Talk to your care team about the use of this medication in children. While this medication may be prescribed for children as young as 2 years for selected conditions, precautions do apply. Overdosage: If you think you have taken too much of this medicine contact a poison control center or emergency room at once. NOTE: This medicine is only for you. Do not share this medicine with others. What if I miss a dose? Keep appointments for follow-up doses. It is important not to miss your dose. Call your care team if you are unable to keep an appointment. What may interact with this medication? Do not take this medication with any of the following: Deferoxamine Dimercaprol Other iron products This medication may also interact with the following: Chloramphenicol Deferasirox This list may not describe all possible interactions. Give your health care provider a list of all the medicines, herbs, non-prescription drugs, or dietary supplements you use. Also tell them if you smoke,  drink alcohol, or use illegal drugs. Some items may interact with your medicine. What should I watch for while using this medication? Visit your care team regularly. Tell your care team if your symptoms do not start to get better or if they get worse. You may need blood work done while you are taking this medication. You may need to follow a special diet. Talk to your care team. Foods that contain iron include: whole grains/cereals, dried fruits, beans, or peas, leafy green vegetables, and organ meats (liver, kidney). What side effects may I notice from receiving this medication? Side effects that you should report to your care team as soon as possible: Allergic reactions--skin rash, itching, hives, swelling of the face, lips, tongue, or throat Low blood pressure--dizziness, feeling faint or lightheaded, blurry vision Shortness of breath Side effects that usually do not require medical attention (report to your care team if they continue or are bothersome): Flushing Headache Joint pain Muscle pain Nausea Pain, redness, or irritation at injection site This list may not describe all possible side effects. Call your doctor for medical advice about side effects. You may report side effects to FDA at 1-800-FDA-1088. Where should I keep my medication? This medication is given in a hospital or clinic. It will not be stored at home. NOTE: This sheet is a summary. It may not cover all possible information. If you have questions about this medicine, talk to your doctor, pharmacist, or health care provider.  2024 Elsevier/Gold Standard (2023-04-28 00:00:00)

## 2023-07-21 ENCOUNTER — Encounter: Payer: Self-pay | Admitting: Hematology and Oncology

## 2023-07-21 ENCOUNTER — Inpatient Hospital Stay: Payer: No Typology Code available for payment source

## 2023-07-21 ENCOUNTER — Other Ambulatory Visit: Payer: Self-pay

## 2023-07-21 ENCOUNTER — Telehealth: Payer: Self-pay | Admitting: Hematology and Oncology

## 2023-07-21 ENCOUNTER — Encounter: Payer: Self-pay | Admitting: Family Medicine

## 2023-07-21 ENCOUNTER — Telehealth: Payer: Self-pay | Admitting: *Deleted

## 2023-07-21 DIAGNOSIS — D509 Iron deficiency anemia, unspecified: Secondary | ICD-10-CM

## 2023-07-21 LAB — CBC WITH DIFFERENTIAL (CANCER CENTER ONLY)
Abs Immature Granulocytes: 0.01 10*3/uL (ref 0.00–0.07)
Basophils Absolute: 0.1 10*3/uL (ref 0.0–0.1)
Basophils Relative: 1 %
Eosinophils Absolute: 0.3 10*3/uL (ref 0.0–0.5)
Eosinophils Relative: 4 %
HCT: 37.5 % (ref 36.0–46.0)
Hemoglobin: 11.6 g/dL — ABNORMAL LOW (ref 12.0–15.0)
Immature Granulocytes: 0 %
Lymphocytes Relative: 34 %
Lymphs Abs: 2.1 10*3/uL (ref 0.7–4.0)
MCH: 25.7 pg — ABNORMAL LOW (ref 26.0–34.0)
MCHC: 30.9 g/dL (ref 30.0–36.0)
MCV: 83 fL (ref 80.0–100.0)
Monocytes Absolute: 0.3 10*3/uL (ref 0.1–1.0)
Monocytes Relative: 5 %
Neutro Abs: 3.5 10*3/uL (ref 1.7–7.7)
Neutrophils Relative %: 56 %
Platelet Count: 399 10*3/uL (ref 150–400)
RBC: 4.52 MIL/uL (ref 3.87–5.11)
RDW: 21.6 % — ABNORMAL HIGH (ref 11.5–15.5)
WBC Count: 6.2 10*3/uL (ref 4.0–10.5)
nRBC: 0 % (ref 0.0–0.2)

## 2023-07-21 LAB — IRON AND IRON BINDING CAPACITY (CC-WL,HP ONLY)
Iron: 68 ug/dL (ref 28–170)
Saturation Ratios: 16 % (ref 10.4–31.8)
TIBC: 420 ug/dL (ref 250–450)
UIBC: 352 ug/dL

## 2023-07-21 LAB — VITAMIN B12: Vitamin B-12: 242 pg/mL (ref 180–914)

## 2023-07-21 LAB — FERRITIN: Ferritin: 51 ng/mL (ref 11–307)

## 2023-07-21 NOTE — Telephone Encounter (Signed)
Per lab - pt is a difficult blood draw with noted clotting interfering with collection. Unable to get zinc, copper and selenium.  Will proceed with other lab orders and pt will come in tomorrow for needed IV iron as scheduled.

## 2023-07-21 NOTE — Telephone Encounter (Signed)
I tried to call the patient with regards to other questions that she raised regarding how she feels that something is going on that we are not able to identify.  However her phone goes straight to voicemail but then it says voicemail box is full. We would like her to finish her final iron infusion. We could also run additional testing for copper zinc selenium to see if she has any micronutrient deficiencies.

## 2023-07-22 ENCOUNTER — Inpatient Hospital Stay: Payer: No Typology Code available for payment source

## 2023-07-22 VITALS — BP 113/71 | HR 72 | Temp 98.6°F | Resp 16

## 2023-07-22 DIAGNOSIS — D509 Iron deficiency anemia, unspecified: Secondary | ICD-10-CM

## 2023-07-22 MED ORDER — SODIUM CHLORIDE 0.9 % IV SOLN
300.0000 mg | Freq: Once | INTRAVENOUS | Status: AC
  Administered 2023-07-22: 300 mg via INTRAVENOUS
  Filled 2023-07-22: qty 300

## 2023-07-22 MED ORDER — SODIUM CHLORIDE 0.9 % IV SOLN: Freq: Once | INTRAVENOUS | Status: AC

## 2023-07-22 MED ORDER — DIPHENHYDRAMINE HCL 50 MG/ML IJ SOLN
25.0000 mg | Freq: Once | INTRAMUSCULAR | Status: AC
  Administered 2023-07-22: 25 mg via INTRAVENOUS
  Filled 2023-07-22: qty 1

## 2023-07-22 NOTE — Patient Instructions (Signed)
 Iron Sucrose Injection What is this medication? IRON SUCROSE (EYE ern SOO krose) treats low levels of iron (iron deficiency anemia) in people with kidney disease. Iron is a mineral that plays an important role in making red blood cells, which carry oxygen from your lungs to the rest of your body. This medicine may be used for other purposes; ask your health care provider or pharmacist if you have questions. COMMON BRAND NAME(S): Venofer What should I tell my care team before I take this medication? They need to know if you have any of these conditions: Anemia not caused by low iron levels Heart disease High levels of iron in the blood Kidney disease Liver disease An unusual or allergic reaction to iron, other medications, foods, dyes, or preservatives Pregnant or trying to get pregnant Breastfeeding How should I use this medication? This medication is for infusion into a vein. It is given in a hospital or clinic setting. Talk to your care team about the use of this medication in children. While this medication may be prescribed for children as young as 2 years for selected conditions, precautions do apply. Overdosage: If you think you have taken too much of this medicine contact a poison control center or emergency room at once. NOTE: This medicine is only for you. Do not share this medicine with others. What if I miss a dose? Keep appointments for follow-up doses. It is important not to miss your dose. Call your care team if you are unable to keep an appointment. What may interact with this medication? Do not take this medication with any of the following: Deferoxamine Dimercaprol Other iron products This medication may also interact with the following: Chloramphenicol Deferasirox This list may not describe all possible interactions. Give your health care provider a list of all the medicines, herbs, non-prescription drugs, or dietary supplements you use. Also tell them if you smoke,  drink alcohol, or use illegal drugs. Some items may interact with your medicine. What should I watch for while using this medication? Visit your care team regularly. Tell your care team if your symptoms do not start to get better or if they get worse. You may need blood work done while you are taking this medication. You may need to follow a special diet. Talk to your care team. Foods that contain iron include: whole grains/cereals, dried fruits, beans, or peas, leafy green vegetables, and organ meats (liver, kidney). What side effects may I notice from receiving this medication? Side effects that you should report to your care team as soon as possible: Allergic reactions--skin rash, itching, hives, swelling of the face, lips, tongue, or throat Low blood pressure--dizziness, feeling faint or lightheaded, blurry vision Shortness of breath Side effects that usually do not require medical attention (report to your care team if they continue or are bothersome): Flushing Headache Joint pain Muscle pain Nausea Pain, redness, or irritation at injection site This list may not describe all possible side effects. Call your doctor for medical advice about side effects. You may report side effects to FDA at 1-800-FDA-1088. Where should I keep my medication? This medication is given in a hospital or clinic. It will not be stored at home. NOTE: This sheet is a summary. It may not cover all possible information. If you have questions about this medicine, talk to your doctor, pharmacist, or health care provider.  2024 Elsevier/Gold Standard (2023-04-28 00:00:00)

## 2023-07-24 LAB — FOLATE RBC
Folate, Hemolysate: 411 ng/mL
Folate, RBC: 1135 ng/mL (ref >498)
Hematocrit: 36.2 % (ref 34.0–46.6)

## 2023-08-02 ENCOUNTER — Encounter: Payer: Self-pay | Admitting: Family Medicine

## 2023-08-02 DIAGNOSIS — N920 Excessive and frequent menstruation with regular cycle: Secondary | ICD-10-CM

## 2023-08-19 ENCOUNTER — Ambulatory Visit (HOSPITAL_BASED_OUTPATIENT_CLINIC_OR_DEPARTMENT_OTHER)
Admission: RE | Admit: 2023-08-19 | Discharge: 2023-08-19 | Disposition: A | Discharge status: Home / Self Care | Payer: No Typology Code available for payment source | Source: Ambulatory Visit | Attending: Family Medicine | Admitting: Family Medicine

## 2023-08-19 DIAGNOSIS — N920 Excessive and frequent menstruation with regular cycle: Secondary | ICD-10-CM | POA: Diagnosis present

## 2023-08-21 ENCOUNTER — Encounter (INDEPENDENT_AMBULATORY_CARE_PROVIDER_SITE_OTHER): Payer: No Typology Code available for payment source | Admitting: Family Medicine

## 2023-08-21 DIAGNOSIS — R3915 Urgency of urination: Secondary | ICD-10-CM

## 2023-08-22 NOTE — Telephone Encounter (Signed)
Forms have been printed and placed in folder.

## 2023-08-23 NOTE — Telephone Encounter (Signed)
Please see the MyChart message reply(ies) for my assessment and plan.  The patient gave consent for this Medical Advice Message and is aware that it may result in a bill to their insurance company as well as the possibility that this may result in a co-payment or deductible. They are an established patient, but are not seeking medical advice exclusively about a problem treated during an in person or video visit in the last 7 days. I did not recommend an in person or video visit within 7 days of my reply.  I spent a total of 15 minutes cumulative time within 7 days through [employer] Abbe Amsterdam, MD

## 2023-08-24 ENCOUNTER — Encounter: Payer: Self-pay | Admitting: Family Medicine

## 2023-08-24 DIAGNOSIS — N920 Excessive and frequent menstruation with regular cycle: Secondary | ICD-10-CM

## 2023-08-25 NOTE — Addendum Note (Signed)
Addended by: Abbe Amsterdam C on: 08/25/2023 01:56 PM   Modules accepted: Orders

## 2023-08-25 NOTE — Telephone Encounter (Signed)
This has been refaxed

## 2023-09-27 ENCOUNTER — Ambulatory Visit: Payer: No Typology Code available for payment source | Admitting: Gastroenterology

## 2023-10-17 ENCOUNTER — Other Ambulatory Visit: Payer: Self-pay | Admitting: *Deleted

## 2023-10-17 DIAGNOSIS — D509 Iron deficiency anemia, unspecified: Secondary | ICD-10-CM

## 2023-10-18 ENCOUNTER — Inpatient Hospital Stay: Payer: No Typology Code available for payment source

## 2023-10-19 ENCOUNTER — Inpatient Hospital Stay: Payer: No Typology Code available for payment source | Attending: Hematology and Oncology

## 2023-10-19 DIAGNOSIS — E538 Deficiency of other specified B group vitamins: Secondary | ICD-10-CM | POA: Diagnosis not present

## 2023-10-19 DIAGNOSIS — D509 Iron deficiency anemia, unspecified: Secondary | ICD-10-CM | POA: Insufficient documentation

## 2023-10-19 DIAGNOSIS — Z79899 Other long term (current) drug therapy: Secondary | ICD-10-CM | POA: Insufficient documentation

## 2023-10-19 LAB — CBC WITH DIFFERENTIAL (CANCER CENTER ONLY)
Abs Immature Granulocytes: 0.02 10*3/uL (ref 0.00–0.07)
Basophils Absolute: 0 10*3/uL (ref 0.0–0.1)
Basophils Relative: 1 %
Eosinophils Absolute: 0 10*3/uL (ref 0.0–0.5)
Eosinophils Relative: 0 %
HCT: 33.2 % — ABNORMAL LOW (ref 36.0–46.0)
Hemoglobin: 11.1 g/dL — ABNORMAL LOW (ref 12.0–15.0)
Immature Granulocytes: 0 %
Lymphocytes Relative: 21 %
Lymphs Abs: 1.2 10*3/uL (ref 0.7–4.0)
MCH: 28.7 pg (ref 26.0–34.0)
MCHC: 33.4 g/dL (ref 30.0–36.0)
MCV: 85.8 fL (ref 80.0–100.0)
Monocytes Absolute: 0.3 10*3/uL (ref 0.1–1.0)
Monocytes Relative: 5 %
Neutro Abs: 4.3 10*3/uL (ref 1.7–7.7)
Neutrophils Relative %: 73 %
Platelet Count: 455 10*3/uL — ABNORMAL HIGH (ref 150–400)
RBC: 3.87 MIL/uL (ref 3.87–5.11)
RDW: 15.3 % (ref 11.5–15.5)
WBC Count: 5.9 10*3/uL (ref 4.0–10.5)
nRBC: 0 % (ref 0.0–0.2)

## 2023-10-19 LAB — IRON AND IRON BINDING CAPACITY (CC-WL,HP ONLY)
Iron: 16 ug/dL — ABNORMAL LOW (ref 28–170)
Saturation Ratios: 3 % — ABNORMAL LOW (ref 10.4–31.8)
TIBC: 466 ug/dL — ABNORMAL HIGH (ref 250–450)
UIBC: 450 ug/dL — ABNORMAL HIGH (ref 148–442)

## 2023-10-19 LAB — VITAMIN B12: Vitamin B-12: 159 pg/mL — ABNORMAL LOW (ref 180–914)

## 2023-10-19 LAB — FERRITIN: Ferritin: 6 ng/mL — ABNORMAL LOW (ref 11–307)

## 2023-10-20 ENCOUNTER — Inpatient Hospital Stay (HOSPITAL_BASED_OUTPATIENT_CLINIC_OR_DEPARTMENT_OTHER): Payer: No Typology Code available for payment source | Admitting: Hematology and Oncology

## 2023-10-20 ENCOUNTER — Encounter: Payer: Self-pay | Admitting: Family Medicine

## 2023-10-20 DIAGNOSIS — E538 Deficiency of other specified B group vitamins: Secondary | ICD-10-CM | POA: Diagnosis not present

## 2023-10-20 DIAGNOSIS — D509 Iron deficiency anemia, unspecified: Secondary | ICD-10-CM | POA: Diagnosis not present

## 2023-10-20 LAB — FOLATE RBC
Folate, Hemolysate: 309 ng/mL
Folate, RBC: 890 ng/mL (ref >498)
Hematocrit: 34.7 % (ref 34.0–46.6)

## 2023-10-20 NOTE — Assessment & Plan Note (Addendum)
(  She had a prior history of multiple PEs and had seen Dr. Myna Hidalgo previously)   IV iron: March 2023, October 2023, 07/07/2023   Labs  02/06/2022: Ferritin 2.6, TIBC 669, there was no hemoglobin available to review. 02/17/2022: Hemoglobin 7.9, MCV 66, RDW 21.2, platelets 593 06/28/23: Hemoglobin: 10.6, Ferritin: 3.6 10/19/2023: Hemoglobin 11.1, MCV 85.8, B12 159, iron saturation 3%, ferritin 6   Difficulty performing tasks with pain in the hands, unexplained bruising: Workup including platelet function assay: Normal, von Willebrand factor: Normal, factor VIII: 98% von Willebrand factor antigen: 86%  Recommendation: Iron infusions B12 replacement therapy orally  Recheck labs 1 month after the IV iron along with the B12 levels to see if oral B12 is adequate.

## 2023-10-20 NOTE — Progress Notes (Signed)
HEMATOLOGY-ONCOLOGY TELEPHONE VISIT PROGRESS NOTE  I connected with our patient on 10/20/23 at  8:15 AM EST by telephone and verified that I am speaking with the correct person using two identifiers.  I discussed the limitations, risks, security and privacy concerns of performing an evaluation and management service by telephone and the availability of in person appointments.  I also discussed with the patient that there may be a patient responsible charge related to this service. The patient expressed understanding and agreed to proceed.   History of Present Illness:  Discussed the use of AI scribe software for clinical note transcription with the patient, who gave verbal consent to proceed.  History of Present Illness   The patient, with a history of iron deficiency anemia, presents with ongoing fatigue and intermittent dizziness. She reports a craving for ice chips, a common symptom of iron deficiency anemia. Despite receiving IV iron three months ago, her iron levels have returned to being 'terribly low.' She also reports a new finding of vitamin B12 deficiency, which she has not previously experienced. She suspects she may have an absorption issue, as she has difficulty maintaining adequate nutrient levels.  The patient denies any current bleeding issues. She recently saw a gynecologist due to concerns about fibroids potentially contributing to her anemia. Small fibroids were identified, but her primary care provider did not believe these were significant. She experienced two months of unusually heavy menstrual cycles, but these have since returned to normal. The patient does not believe this is related to her current issues with iron and B12 deficiency.        Oncology History   No history exists.    REVIEW OF SYSTEMS:   Constitutional: Denies fevers, chills or abnormal weight loss All other systems were reviewed with the patient and are negative. Observations/Objective:      Assessment Plan:  Iron deficiency anemia (She had a prior history of multiple PEs and had seen Dr. Myna Hidalgo previously)   IV iron: March 2023, October 2023, 07/07/2023   Labs  02/06/2022: Ferritin 2.6, TIBC 669, there was no hemoglobin available to review. 02/17/2022: Hemoglobin 7.9, MCV 66, RDW 21.2, platelets 593 06/28/23: Hemoglobin: 10.6, Ferritin: 3.6 10/19/2023: Hemoglobin 11.1, MCV 85.8, B12 159, iron saturation 3%, ferritin 6   Difficulty performing tasks with pain in the hands, unexplained bruising: Workup including platelet function assay: Normal, von Willebrand factor: Normal, factor VIII: 98% von Willebrand factor antigen: 86%  Recommendation: Iron infusions B12 replacement therapy orally  Recheck labs in 3 months after the IV iron along with the B12 levels to see if oral B12 is adequate. --------------------------------- Assessment and Plan    Iron Deficiency Anemia Persistent despite IV iron supplementation in August. Hemoglobin slightly improved to 11.1. No overt bleeding identified. Possible absorption issue suggested by concurrent B12 deficiency. -Schedule for additional IV iron infusion. -Recheck iron levels and hemoglobin in three months.  Vitamin B12 Deficiency Newly identified. Possible absorption issue. -Start over-the-counter B12 supplement, preferably sublingual daily. -Recheck B12 levels in three months.  Menstrual Irregularities Recent episodes of heavy menstrual bleeding. Small fibroid identified but not thought to be significant. -No immediate intervention discussed.  Plan for further evaluation and management if iron and B12 deficiencies persist despite supplementation.          I discussed the assessment and treatment plan with the patient. The patient was provided an opportunity to ask questions and all were answered. The patient agreed with the plan and demonstrated an understanding of the instructions.  The patient was advised to call back  or seek an in-person evaluation if the symptoms worsen or if the condition fails to improve as anticipated.   I provided 12 minutes of non-face-to-face time during this encounter.  This includes time for charting and coordination of care   Tamsen Meek, MD

## 2023-10-22 NOTE — Progress Notes (Unsigned)
[facility] [address], [address] [place], [place] [address] [phone]  Date:  10/25/2023   Name:  Paula Benson   DOB:  1986/06/11   MRN:  [id number]  PCP:  Pearline Cables, MD    Chief Complaint: No chief complaint on file.   History of Present Illness:  Paula Benson is a 37 y.o. very pleasant female patient who presents with the following:  Patient seen today for follow-up visit and to discuss FMLA Most recent visit with myself was in July History of multiple pulmonary embolism, asthma, iron deficiency anemia, migraine, mood disorder   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HCC) 12/31/2015   CAP (community acquired pneumonia) 11/02/2014   Pyelonephritis 10/31/2014   Hypokalemia 10/31/2014   Neck pain 02/05/2014   Migraine headache 08/14/2013   Palpitations 04/12/2012   Hx of preeclampsia, prior pregnancy, currently pregnant--had eclamptic seizure 2 weeks postpartum 02/20/2012   History of aspiration pneumonitis 02/20/2012   Iron deficiency anemia 02/20/2012   Recurrent urinary tract infection 08/25/2011   Genital herpes 06/09/2011   Panic disorder 03/21/2011   History of pulmonary embolus (PE) 03/17/2011   Chronic anticoagulation 03/17/2011   ASTHMA, INTERMITTENT 03/04/2010    Past Medical History:  Diagnosis Date   Anemia    Anxiety    Asthma    Bipolar II disorder (HCC)    Complication of anesthesia 2003   ASPIRATED DURING EMERGENCY CS   Depression    doing ok   Eclamptic seizure 2003   Fibroid    Genital herpes    Headache(784.0)    Herpes    History of blood clots    x3   History of blood transfusion MARCH 2013   Menometrorrhagia 07/27/2016   Migraines    Personal history of PE (pulmonary embolism) x 3, latest 02/2010   noncompliant with INR checks, must limit refills   Preeclampsia 2003   with first  pregnancy   Pregnancy induced hypertension 2003   seized 2wks after   Pyelonephritis 2012   Seizures (HCC) 2003   after first delivery   Suicidal ideations    Urinary tract infection     Past Surgical History:  Procedure Laterality Date   CESAREAN SECTION  12/2001 and 01/2008   CESAREAN SECTION  06/26/2012   Procedure: CESAREAN SECTION;  Surgeon: Willodean Rosenthal, MD;  Location: [facility];  Service: Obstetrics;  Laterality: N/A;   INDUCED ABORTION  01/2010    Social History   Tobacco Use   Smoking status: Never   Smokeless tobacco: Never  Substance Use Topics   Alcohol use: No   Drug use: No    Family History  Problem Relation Age of Onset   Diabetes Mother        Type 2   Anemia Sister    Hypertension Sister    Breast cancer Maternal Aunt    Anesthesia problems Neg Hx     No Known Allergies  Medication list has been reviewed and updated.  Current Outpatient Medications on File Prior to Visit  Medication Sig Dispense Refill   acetaminophen (TYLENOL) 500 MG tablet Take 1,000 mg by mouth every 6 (six) hours as needed for mild pain, moderate pain or headache.     acyclovir (ZOVIRAX) 200 MG capsule Take 2 capsules (400 mg total) by mouth 2 (two) times daily. Use for HSV suppression.  If outbreak take 800 mg TID for 2 days 400 capsule 3   albuterol (VENTOLIN HFA) 108 (90 Base) MCG/ACT inhaler Inhale 2 puffs into the lungs every 6 (six) hours as needed for wheezing or shortness of breath. 1 each 6   ALPRAZolam (XANAX) 0.25 MG tablet Take 1 tablet (0.25 mg total) by mouth 3 (three) times daily as needed for anxiety. 20 tablet 0   ARIPiprazole (ABILIFY) 15 MG tablet Take 1 tablet (15 mg total) by mouth daily. 30 tablet 0   busPIRone (BUSPAR) 10 MG tablet Take 1 tablet (10 mg total) by mouth 2 (two) times daily. 60 tablet 0   cephALEXin (KEFLEX) 500 MG capsule Take 1 capsule (500 mg total) by mouth 3 (three) times daily. 15 capsule 0   Fluocinolone Acetonide Scalp 0.01 % OIL  Apply to scalp daily as needed, wash out 8- 12 hours later 118 mL 1   lurasidone (LATUDA) 40 MG TABS tablet Take 40 mg by mouth daily.     nitrofurantoin, macrocrystal-monohydrate, (MACROBID) 100 MG capsule Take 1 capsule (100 mg total) by mouth 2 (two) times daily. 14 capsule 0   OZEMPIC, 0.25 OR 0.5 MG/DOSE, 2 MG/3ML SOPN DIAL AND INJECT UNDER THE SKIN 0.5 MG WEEKLY 3 mL 1   Rimegepant Sulfate (NURTEC) 75 MG TBDP Take every other day for headache prevention 15 tablet 2   tiZANidine (ZANAFLEX) 4 MG tablet Take 1 tablet (4 mg total) by mouth every 8 (eight) hours as needed for muscle spasms. 30 tablet 6   triamcinolone ointment (KENALOG) 0.5 % Apply 1 Application topically 2 (two) times daily. Use as needed for eczema, do not use on face 60 g 1   Vitamin D, Ergocalciferol, (DRISDOL) 1.25 MG (50000 UNIT) CAPS capsule Take 50,000 Units by mouth once a week.     No current facility-administered medications on file prior to visit.    Review of Systems:  As per HPI- otherwise negative.   Physical Examination: There were no vitals filed for this visit. There were no vitals filed for this visit. There is no height or weight on file to calculate BMI. Ideal Body Weight:    GEN: no acute distress. HEENT: Atraumatic, Normocephalic.  Ears and Nose: No external deformity. CV: RRR, No M/G/R. No JVD. No thrill. No extra heart sounds. PULM: CTA B, no wheezes, crackles, rhonchi. No retractions. No resp. distress. No accessory muscle use. ABD: S, NT, ND, +BS. No rebound. No HSM. EXTR: No c/c/e PSYCH: Normally interactive. Conversant.    Assessment and Plan: ***  Signed Abbe Amsterdam, MD

## 2023-10-23 ENCOUNTER — Encounter: Payer: Self-pay | Admitting: Hematology and Oncology

## 2023-10-23 ENCOUNTER — Telehealth: Payer: Self-pay | Admitting: Hematology and Oncology

## 2023-10-23 NOTE — Telephone Encounter (Signed)
Per Dr. [name] scheduling orders on 10/20/2023 left patient a message regarding scheduling as well as left my callback number when ready to schedule future appointments

## 2023-10-24 ENCOUNTER — Other Ambulatory Visit: Payer: Self-pay | Admitting: Hematology and Oncology

## 2023-10-24 ENCOUNTER — Telehealth: Payer: Self-pay | Admitting: Hematology and Oncology

## 2023-10-24 NOTE — Telephone Encounter (Signed)
Spoke with patient confirming upcoming appointment  

## 2023-10-25 ENCOUNTER — Ambulatory Visit (INDEPENDENT_AMBULATORY_CARE_PROVIDER_SITE_OTHER): Payer: No Typology Code available for payment source | Admitting: Family Medicine

## 2023-10-25 VITALS — BP 112/72 | HR 82 | Temp 97.7°F | Resp 18 | Ht 63.0 in | Wt 180.0 lb

## 2023-10-25 DIAGNOSIS — R002 Palpitations: Secondary | ICD-10-CM

## 2023-10-25 DIAGNOSIS — G43709 Chronic migraine without aura, not intractable, without status migrainosus: Secondary | ICD-10-CM | POA: Diagnosis not present

## 2023-10-25 DIAGNOSIS — N920 Excessive and frequent menstruation with regular cycle: Secondary | ICD-10-CM | POA: Diagnosis not present

## 2023-10-25 NOTE — Patient Instructions (Signed)
EKG is normal- let me know if palpitations come back We will send in [facility] for you

## 2023-10-31 ENCOUNTER — Encounter: Payer: Self-pay | Admitting: Hematology and Oncology

## 2023-10-31 ENCOUNTER — Inpatient Hospital Stay: Payer: No Typology Code available for payment source

## 2023-10-31 NOTE — Patient Instructions (Signed)
Iron Sucrose Injection What is this medication? IRON SUCROSE (EYE ern SOO krose) treats low levels of iron (iron deficiency anemia) in people with kidney disease. Iron is a mineral that plays an important role in making red blood cells, which carry oxygen from your lungs to the rest of your body. This medicine may be used for other purposes; ask your health care provider or pharmacist if you have questions. COMMON BRAND NAME(S): Venofer What should I tell my care team before I take this medication? They need to know if you have any of these conditions: Anemia not caused by low iron levels Heart disease High levels of iron in the blood Kidney disease Liver disease An unusual or allergic reaction to iron, other medications, foods, dyes, or preservatives Pregnant or trying to get pregnant Breastfeeding How should I use this medication? This medication is for infusion into a vein. It is given in a hospital or clinic setting. Talk to your care team about the use of this medication in children. While this medication may be prescribed for children as young as 2 years for selected conditions, precautions do apply. Overdosage: If you think you have taken too much of this medicine contact a poison control center or emergency room at once. NOTE: This medicine is only for you. Do not share this medicine with others. What if I miss a dose? Keep appointments for follow-up doses. It is important not to miss your dose. Call your care team if you are unable to keep an appointment. What may interact with this medication? Do not take this medication with any of the following: Deferoxamine Dimercaprol Other iron products This medication may also interact with the following: Chloramphenicol Deferasirox This list may not describe all possible interactions. Give your health care provider a list of all the medicines, herbs, non-prescription drugs, or dietary supplements you use. Also tell them if you smoke,  drink alcohol, or use illegal drugs. Some items may interact with your medicine. What should I watch for while using this medication? Visit your care team regularly. Tell your care team if your symptoms do not start to get better or if they get worse. You may need blood work done while you are taking this medication. You may need to follow a special diet. Talk to your care team. Foods that contain iron include: whole grains/cereals, dried fruits, beans, or peas, leafy green vegetables, and organ meats (liver, kidney). What side effects may I notice from receiving this medication? Side effects that you should report to your care team as soon as possible: Allergic reactions--skin rash, itching, hives, swelling of the face, lips, tongue, or throat Low blood pressure--dizziness, feeling faint or lightheaded, blurry vision Shortness of breath Side effects that usually do not require medical attention (report to your care team if they continue or are bothersome): Flushing Headache Joint pain Muscle pain Nausea Pain, redness, or irritation at injection site This list may not describe all possible side effects. Call your doctor for medical advice about side effects. You may report side effects to FDA at 1-800-FDA-1088. Where should I keep my medication? This medication is given in a hospital or clinic. It will not be stored at home. NOTE: This sheet is a summary. It may not cover all possible information. If you have questions about this medicine, talk to your doctor, pharmacist, or health care provider.  2024 Elsevier/Gold Standard (2023-04-28 00:00:00)

## 2023-10-31 NOTE — Progress Notes (Signed)
Patient was seen today for IV Venofer 300 mg. Patient was stuck 8 times for a PIV without any success. Patient verbalized that this was not unusual for her and that she has had to be stuck using the ultra sound at [facility] in the past. Patient was rescheduled to [facility] on Saturday 11/30 @ 0830 and she was agreeable with this.

## 2023-11-01 NOTE — Telephone Encounter (Signed)
Corrected forms have been faxed.

## 2023-11-04 ENCOUNTER — Inpatient Hospital Stay: Payer: No Typology Code available for payment source

## 2023-11-04 VITALS — BP 116/84 | HR 65 | Temp 97.9°F | Resp 18

## 2023-11-04 DIAGNOSIS — D509 Iron deficiency anemia, unspecified: Secondary | ICD-10-CM | POA: Diagnosis not present

## 2023-11-04 MED ORDER — SODIUM CHLORIDE 0.9 % IV SOLN
300.0000 mg | Freq: Once | INTRAVENOUS | Status: AC
  Administered 2023-11-04: 300 mg via INTRAVENOUS
  Filled 2023-11-04: qty 5

## 2023-11-04 MED ORDER — SODIUM CHLORIDE 0.9 % IV SOLN: INTRAVENOUS | Status: DC

## 2023-11-04 MED ORDER — CYANOCOBALAMIN 1000 MCG/ML IJ SOLN
1000.0000 ug | Freq: Once | INTRAMUSCULAR | Status: AC
  Administered 2023-11-04: 1000 ug via INTRAMUSCULAR
  Filled 2023-11-04: qty 1

## 2023-11-04 MED ORDER — DIPHENHYDRAMINE HCL 50 MG/ML IJ SOLN
25.0000 mg | Freq: Once | INTRAMUSCULAR | Status: AC
  Administered 2023-11-04: 25 mg via INTRAVENOUS
  Filled 2023-11-04: qty 1

## 2023-11-04 NOTE — Patient Instructions (Signed)
Iron Sucrose Injection What is this medication? IRON SUCROSE (EYE ern SOO krose) treats low levels of iron (iron deficiency anemia) in people with kidney disease. Iron is a mineral that plays an important role in making red blood cells, which carry oxygen from your lungs to the rest of your body. This medicine may be used for other purposes; ask your health care provider or pharmacist if you have questions. COMMON BRAND NAME(S): Venofer What should I tell my care team before I take this medication? They need to know if you have any of these conditions: Anemia not caused by low iron levels Heart disease High levels of iron in the blood Kidney disease Liver disease An unusual or allergic reaction to iron, other medications, foods, dyes, or preservatives Pregnant or trying to get pregnant Breastfeeding How should I use this medication? This medication is for infusion into a vein. It is given in a hospital or clinic setting. Talk to your care team about the use of this medication in children. While this medication may be prescribed for children as young as 2 years for selected conditions, precautions do apply. Overdosage: If you think you have taken too much of this medicine contact a poison control center or emergency room at once. NOTE: This medicine is only for you. Do not share this medicine with others. What if I miss a dose? Keep appointments for follow-up doses. It is important not to miss your dose. Call your care team if you are unable to keep an appointment. What may interact with this medication? Do not take this medication with any of the following: Deferoxamine Dimercaprol Other iron products This medication may also interact with the following: Chloramphenicol Deferasirox This list may not describe all possible interactions. Give your health care provider a list of all the medicines, herbs, non-prescription drugs, or dietary supplements you use. Also tell them if you smoke,  drink alcohol, or use illegal drugs. Some items may interact with your medicine. What should I watch for while using this medication? Visit your care team regularly. Tell your care team if your symptoms do not start to get better or if they get worse. You may need blood work done while you are taking this medication. You may need to follow a special diet. Talk to your care team. Foods that contain iron include: whole grains/cereals, dried fruits, beans, or peas, leafy green vegetables, and organ meats (liver, kidney). What side effects may I notice from receiving this medication? Side effects that you should report to your care team as soon as possible: Allergic reactions--skin rash, itching, hives, swelling of the face, lips, tongue, or throat Low blood pressure--dizziness, feeling faint or lightheaded, blurry vision Shortness of breath Side effects that usually do not require medical attention (report to your care team if they continue or are bothersome): Flushing Headache Joint pain Muscle pain Nausea Pain, redness, or irritation at injection site This list may not describe all possible side effects. Call your doctor for medical advice about side effects. You may report side effects to FDA at 1-800-FDA-1088. Where should I keep my medication? This medication is given in a hospital or clinic. It will not be stored at home. NOTE: This sheet is a summary. It may not cover all possible information. If you have questions about this medicine, talk to your doctor, pharmacist, or health care provider.  2024 Elsevier/Gold Standard (2023-04-28 00:00:00)  Vitamin B12 Injection What is this medication? Vitamin B12 (VAHY tuh min B12) prevents and treats low  vitamin B12 levels in your body. It is used in people who do not get enough vitamin B12 from their diet or when their digestive tract does not absorb enough. Vitamin B12 plays an important role in maintaining the health of your nervous system and  red blood cells. This medicine may be used for other purposes; ask your health care provider or pharmacist if you have questions. COMMON BRAND NAME(S): B-12 Compliance Kit, B-12 Injection Kit, Cyomin, Dodex, LA-12, Nutri-Twelve, Physicians EZ Use B-12, Primabalt, Vitamin Deficiency Injectable System - B12 What should I tell my care team before I take this medication? They need to know if you have any of these conditions: Kidney disease Leber's disease Megaloblastic anemia An unusual or allergic reaction to cyanocobalamin, cobalt, other medications, foods, dyes, or preservatives Pregnant or trying to get pregnant Breast-feeding How should I use this medication? This medication is injected into a muscle or deeply under the skin. It is usually given in a clinic or care team's office. However, your care team may teach you how to inject yourself. Follow all instructions. Talk to your care team about the use of this medication in children. Special care may be needed. Overdosage: If you think you have taken too much of this medicine contact a poison control center or emergency room at once. NOTE: This medicine is only for you. Do not share this medicine with others. What if I miss a dose? If you are given your dose at a clinic or care team's office, call to reschedule your appointment. If you give your own injections, and you miss a dose, take it as soon as you can. If it is almost time for your next dose, take only that dose. Do not take double or extra doses. What may interact with this medication? Alcohol Colchicine This list may not describe all possible interactions. Give your health care provider a list of all the medicines, herbs, non-prescription drugs, or dietary supplements you use. Also tell them if you smoke, drink alcohol, or use illegal drugs. Some items may interact with your medicine. What should I watch for while using this medication? Visit your care team regularly. You may need  blood work done while you are taking this medication. You may need to follow a special diet. Talk to your care team. Limit your alcohol intake and avoid smoking to get the best benefit. What side effects may I notice from receiving this medication? Side effects that you should report to your care team as soon as possible: Allergic reactions--skin rash, itching, hives, swelling of the face, lips, tongue, or throat Swelling of the ankles, hands, or feet Trouble breathing Side effects that usually do not require medical attention (report to your care team if they continue or are bothersome): Diarrhea This list may not describe all possible side effects. Call your doctor for medical advice about side effects. You may report side effects to FDA at 1-800-FDA-1088. Where should I keep my medication? Keep out of the reach of children. Store at room temperature between 15 and 30 degrees C (59 and 85 degrees F). Protect from light. Throw away any unused medication after the expiration date. NOTE: This sheet is a summary. It may not cover all possible information. If you have questions about this medicine, talk to your doctor, pharmacist, or health care provider.  2024 Elsevier/Gold Standard (2021-08-03 00:00:00)

## 2023-11-07 NOTE — Progress Notes (Unsigned)
[facility] [address], [address] [place], [place] [address] [phone]  Date:  11/08/2023   Name:  Paula Benson   DOB:  [birth year]   MRN:  [id number]  PCP:  Pearline Cables, MD    Chief Complaint: No chief complaint on file.   History of Present Illness:  Paula Benson is a 37 y.o. very pleasant female patient who presents with the following:  Patient seen today for a virtual visit.  Patient location is home, my location is office.  Patient identity confirmed with 2 factors, she gives consent for virtual visit today I saw Aquilla most recently on 11/20- History of multiple pulmonary embolism, asthma, iron deficiency anemia, migraine, mood disorder    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HCC) 12/31/2015   CAP (community acquired pneumonia) 11/02/2014   Pyelonephritis 10/31/2014   Hypokalemia 10/31/2014   Neck pain 02/05/2014   Migraine headache 08/14/2013   Palpitations 04/12/2012   History of aspiration pneumonitis 02/20/2012   Iron deficiency anemia 02/20/2012   Recurrent urinary tract infection 08/25/2011   Genital herpes 06/09/2011   Panic disorder 03/21/2011   Chronic anticoagulation 03/17/2011   ASTHMA, INTERMITTENT 03/04/2010    Past Medical History:  Diagnosis Date   Anemia    Anxiety    Asthma    Bipolar II disorder (HCC)    Complication of anesthesia 2003   ASPIRATED DURING EMERGENCY CS   Depression    doing ok   Eclamptic seizure 2003   Fibroid    Genital herpes    Headache(784.0)    Herpes    History of blood clots    x3   History of blood transfusion MARCH 2013   Menometrorrhagia 07/27/2016   Migraines    Personal history of PE (pulmonary embolism) x 3, latest 02/2010   noncompliant with INR checks, must limit refills   Preeclampsia 2003   with first pregnancy   Pregnancy induced hypertension 2003    seized 2wks after   Pyelonephritis 2012   Seizures (HCC) 2003   after first delivery   Suicidal ideations    Urinary tract infection     Past Surgical History:  Procedure Laterality Date   CESAREAN SECTION  12/2001 and 01/2008   CESAREAN SECTION  06/26/2012   Procedure: CESAREAN SECTION;  Surgeon: Willodean Rosenthal, MD;  Location: [facility];  Service: Obstetrics;  Laterality: N/A;   INDUCED ABORTION  01/2010    Social History   Tobacco Use   Smoking status: Never   Smokeless tobacco: Never  Substance Use Topics   Alcohol use: No   Drug use: No    Family History  Problem Relation Age of Onset   Diabetes Mother        Type 2   Anemia Sister    Hypertension Sister    Breast cancer Maternal Aunt    Anesthesia problems Neg Hx     No Known Allergies  Medication list has been reviewed and updated.  Current Outpatient Medications on File Prior to Visit  Medication Sig Dispense Refill   acetaminophen (TYLENOL) 500 MG tablet Take 1,000 mg by mouth every 6 (six) hours as needed for mild pain, moderate pain or headache.     acyclovir (ZOVIRAX) 200 MG capsule Take 2 capsules (400 mg total) by mouth 2 (two) times daily. Use for HSV suppression.  If outbreak  take 800 mg TID for 2 days 400 capsule 3   albuterol (VENTOLIN HFA) 108 (90 Base) MCG/ACT inhaler Inhale 2 puffs into the lungs every 6 (six) hours as needed for wheezing or shortness of breath. 1 each 6   ALPRAZolam (XANAX) 0.25 MG tablet Take 1 tablet (0.25 mg total) by mouth 3 (three) times daily as needed for anxiety. 20 tablet 0   ARIPiprazole (ABILIFY) 15 MG tablet Take 1 tablet (15 mg total) by mouth daily. 30 tablet 0   busPIRone (BUSPAR) 10 MG tablet Take 1 tablet (10 mg total) by mouth 2 (two) times daily. 60 tablet 0   cephALEXin (KEFLEX) 500 MG capsule Take 1 capsule (500 mg total) by mouth 3 (three) times daily. 15 capsule 0   Fluocinolone Acetonide Scalp 0.01 % OIL Apply to scalp daily as needed, wash out 8- 12 hours  later 118 mL 1   lurasidone (LATUDA) 40 MG TABS tablet Take 40 mg by mouth daily.     nitrofurantoin, macrocrystal-monohydrate, (MACROBID) 100 MG capsule Take 1 capsule (100 mg total) by mouth 2 (two) times daily. 14 capsule 0   OZEMPIC, 0.25 OR 0.5 MG/DOSE, 2 MG/3ML SOPN DIAL AND INJECT UNDER THE SKIN 0.5 MG WEEKLY 3 mL 1   Rimegepant Sulfate (NURTEC) 75 MG TBDP Take every other day for headache prevention 15 tablet 2   tiZANidine (ZANAFLEX) 4 MG tablet Take 1 tablet (4 mg total) by mouth every 8 (eight) hours as needed for muscle spasms. 30 tablet 6   triamcinolone ointment (KENALOG) 0.5 % Apply 1 Application topically 2 (two) times daily. Use as needed for eczema, do not use on face 60 g 1   Vitamin D, Ergocalciferol, (DRISDOL) 1.25 MG (50000 UNIT) CAPS capsule Take 50,000 Units by mouth once a week.     No current facility-administered medications on file prior to visit.    Review of Systems:  As per HPI- otherwise negative.   Physical Examination: There were no vitals filed for this visit. There were no vitals filed for this visit. There is no height or weight on file to calculate BMI. Ideal Body Weight:    GEN: no acute distress. HEENT: Atraumatic, Normocephalic.  Ears and Nose: No external deformity. CV: RRR, No M/G/R. No JVD. No thrill. No extra heart sounds. PULM: CTA B, no wheezes, crackles, rhonchi. No retractions. No resp. distress. No accessory muscle use. ABD: S, NT, ND, +BS. No rebound. No HSM. EXTR: No c/c/e PSYCH: Normally interactive. Conversant. '  Assessment and Plan: ***  Signed Abbe Amsterdam, MD

## 2023-11-08 ENCOUNTER — Telehealth: Payer: No Typology Code available for payment source | Admitting: Family Medicine

## 2023-11-08 ENCOUNTER — Telehealth: Payer: Self-pay

## 2023-11-08 DIAGNOSIS — E611 Iron deficiency: Secondary | ICD-10-CM | POA: Diagnosis not present

## 2023-11-08 NOTE — Telephone Encounter (Signed)
This has been faxed again

## 2023-11-08 NOTE — Telephone Encounter (Signed)
Left voicemail notifying patient that her FMLA documents were completed and faxed back to company. Fax confirmation received. Copy of documents place upfront for pick up at next visit.

## 2023-11-10 ENCOUNTER — Other Ambulatory Visit: Payer: Self-pay | Admitting: *Deleted

## 2023-11-10 ENCOUNTER — Telehealth: Payer: Self-pay | Admitting: *Deleted

## 2023-11-10 DIAGNOSIS — D509 Iron deficiency anemia, unspecified: Secondary | ICD-10-CM

## 2023-11-10 DIAGNOSIS — I878 Other specified disorders of veins: Secondary | ICD-10-CM

## 2023-11-10 MED ORDER — LIDOCAINE-PRILOCAINE 2.5-2.5 % EX CREA: 1.0000 | TOPICAL_CREAM | CUTANEOUS | 0 refills | Status: DC | PRN

## 2023-11-10 NOTE — Progress Notes (Signed)
Per MD request, orders placed for pt to have port a cath placed due to poor venous access for IV iron.

## 2023-11-10 NOTE — Telephone Encounter (Signed)
RN placed call to pt to furuther discuss port a cath placement and when to apply emla cream.  Pt wishing to start IV iron post port a cath placement.  Message sent to scheduling pool to adjust appts.  Message also sent to schedule pt q8 week port flush appts.

## 2023-11-11 ENCOUNTER — Inpatient Hospital Stay: Payer: No Typology Code available for payment source

## 2023-11-14 ENCOUNTER — Other Ambulatory Visit: Payer: Self-pay | Admitting: Urology

## 2023-11-16 ENCOUNTER — Other Ambulatory Visit (HOSPITAL_COMMUNITY): Payer: No Typology Code available for payment source

## 2023-11-16 NOTE — H&P (Signed)
Chief Complaint: Durable IV access for Iron Infusions. Request is for portacath placement  Referring Physician(s): Gudena,Vinay  Supervising Physician: Oley Balm  Patient Status: [facility] - Out-pt  History of Present Illness: Paula Benson is a 37 y.o. female  outpatient. History of PE (not currently on anticoagulation), iron deficiency requiring transfusion. The patient is a difficult IV stick. Team is requesting a portacath for durable IV access.   Currently without any significant complaints. Patient alert and laying in bed,calm. Denies any fevers, headache, chest pain, SOB, cough, abdominal pain, nausea, vomiting or bleeding.   No recent labs, All medications are within acceptable parameters. NKDA. Patient has been NPO since midnight.  Return precautions and treatment recommendations and follow-up discussed with the patient  who is agreeable with the plan.  Past Medical History:  Diagnosis Date   Anemia    Anxiety    Asthma    Bipolar II disorder (HCC)    Complication of anesthesia 2003   ASPIRATED DURING EMERGENCY CS   Depression    doing ok   Eclamptic seizure 2003   Fibroid    Genital herpes    Headache(784.0)    Herpes    History of blood clots    x3   History of blood transfusion MARCH 2013   Menometrorrhagia 07/27/2016   Migraines    Personal history of PE (pulmonary embolism) x 3, latest 02/2010   noncompliant with INR checks, must limit refills   Preeclampsia 2003   with first pregnancy   Pregnancy induced hypertension 2003   seized 2wks after   Pyelonephritis 2012   Seizures (HCC) 2003   after first delivery   Suicidal ideations    Urinary tract infection     Past Surgical History:  Procedure Laterality Date   CESAREAN SECTION  12/2001 and 01/2008   CESAREAN SECTION  06/26/2012   Procedure: CESAREAN SECTION;  Surgeon: Willodean Rosenthal, MD;  Location: [facility];  Service: Obstetrics;  Laterality: N/A;   INDUCED ABORTION  01/2010     Allergies: Patient has no known allergies.  Medications: Prior to Admission medications   Medication Sig Start Date End Date Taking? Authorizing Provider  acetaminophen (TYLENOL) 500 MG tablet Take 1,000 mg by mouth every 6 (six) hours as needed for mild pain, moderate pain or headache.    [provider]  acyclovir (ZOVIRAX) 200 MG capsule Take 2 capsules (400 mg total) by mouth 2 (two) times daily. Use for HSV suppression.  If outbreak take 800 mg TID for 2 days 04/17/23   Copland, Gwenlyn Found, MD  albuterol (VENTOLIN HFA) 108 (90 Base) MCG/ACT inhaler Inhale 2 puffs into the lungs every 6 (six) hours as needed for wheezing or shortness of breath. 06/15/22   Copland, Gwenlyn Found, MD  ALPRAZolam Prudy Feeler) 0.25 MG tablet Take 1 tablet (0.25 mg total) by mouth 3 (three) times daily as needed for anxiety. 07/28/22   Copland, Gwenlyn Found, MD  ARIPiprazole (ABILIFY) 15 MG tablet Take 1 tablet (15 mg total) by mouth daily. 07/28/22   Copland, Gwenlyn Found, MD  busPIRone (BUSPAR) 10 MG tablet Take 1 tablet (10 mg total) by mouth 2 (two) times daily. 07/28/22   Copland, Gwenlyn Found, MD  cephALEXin (KEFLEX) 500 MG capsule Take 1 capsule (500 mg total) by mouth 3 (three) times daily. 07/05/23   Copland, Gwenlyn Found, MD  Fluocinolone Acetonide Scalp 0.01 % OIL Apply to scalp daily as needed, wash out 8- 12 hours later 04/13/22   Copland, Shanda Bumps  C, MD  lidocaine-prilocaine (EMLA) cream Apply 1 Application topically as needed. 11/10/23   Serena Croissant, MD  lurasidone (LATUDA) 40 MG TABS tablet Take 40 mg by mouth daily. 09/15/22   [provider]  nitrofurantoin, macrocrystal-monohydrate, (MACROBID) 100 MG capsule Take 1 capsule (100 mg total) by mouth 2 (two) times daily. 07/07/23   Copland, Gwenlyn Found, MD  OZEMPIC, 0.25 OR 0.5 MG/DOSE, 2 MG/3ML SOPN DIAL AND INJECT UNDER THE SKIN 0.5 MG WEEKLY 01/13/23   Copland, Gwenlyn Found, MD  Rimegepant Sulfate (NURTEC) 75 MG TBDP Take every other day for headache prevention  06/28/23   Copland, Gwenlyn Found, MD  tiZANidine (ZANAFLEX) 4 MG tablet Take 1 tablet (4 mg total) by mouth every 8 (eight) hours as needed for muscle spasms. 09/14/22   Ocie Doyne, MD  triamcinolone ointment (KENALOG) 0.5 % Apply 1 Application topically 2 (two) times daily. Use as needed for eczema, do not use on face 07/05/23   Copland, Gwenlyn Found, MD  Vitamin D, Ergocalciferol, (DRISDOL) 1.25 MG (50000 UNIT) CAPS capsule Take 50,000 Units by mouth once a week. 02/08/22   [provider]     Family History  Problem Relation Age of Onset   Diabetes Mother        Type 2   Anemia Sister    Hypertension Sister    Breast cancer Maternal Aunt    Anesthesia problems Neg Hx     Social History   Socioeconomic History   Marital status: Single    Spouse name: Not on file   Number of children: Not on file   Years of education: Not on file   Highest education level: Not on file  Occupational History   Not on file  Tobacco Use   Smoking status: Never   Smokeless tobacco: Never  Vaping Use   Vaping status: Never Used  Substance and Sexual Activity   Alcohol use: No   Drug use: No   Sexual activity: Yes    Birth control/protection: Condom  Other Topics Concern   Not on file  Social History Narrative   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 2 daughters.   Social Drivers of Corporate investment banker Strain: Not on file  Food Insecurity: Not on file  Transportation Needs: Not on file  Physical Activity: Not on file  Stress: Not on file  Social Connections: Unknown (01/11/2023)   [facility]   Social Network    Social Network: Not on file     Review of Systems: A 12 point ROS discussed and pertinent positives are indicated in the HPI above.  All other systems are negative.  Review of Systems  Constitutional:  Negative for fatigue and fever.  HENT:  Negative for congestion.   Respiratory:  Negative  for cough and shortness of breath.   Gastrointestinal:  Negative for abdominal pain, diarrhea, nausea and vomiting.    Vital Signs: BP 129/82   Pulse 69   Temp 98.7 F (37.1 C) (Oral)   Resp 18   Ht 5\' 3"  (1.6 m)   Wt 186 lb 5 oz (84.5 kg)   SpO2 97%   BMI 33.00 kg/m     Physical Exam Vitals and nursing note reviewed.  Constitutional:      Appearance: She is well-developed.  HENT:     Head: Normocephalic and atraumatic.  Eyes:     Conjunctiva/sclera: Conjunctivae normal.  Cardiovascular:  Rate and Rhythm: Normal rate and regular rhythm.  Pulmonary:     Effort: Pulmonary effort is normal.     Breath sounds: Normal breath sounds.  Musculoskeletal:        General: Normal range of motion.     Cervical back: Normal range of motion.  Skin:    General: Skin is warm and dry.  Neurological:     General: No focal deficit present.     Mental Status: She is alert and oriented to person, place, and time. Mental status is at baseline.  Psychiatric:        Mood and Affect: Mood normal.        Behavior: Behavior normal.        Thought Content: Thought content normal.        Judgment: Judgment normal.     Imaging: No results found.  Labs:  CBC: Recent Labs    06/28/23 1618 07/14/23 0937 07/21/23 1615 10/19/23 0832 10/19/23 0833  WBC 7.6 6.6 6.2  --  5.9  HGB 10.6* 10.2* 11.6*  --  11.1*  HCT 34.4* 32.7* 37.5  36.2 34.7 33.2*  PLT 497.0* 506* 399  --  455*    COAGS: No results for input(s): "INR", "APTT" in the last 8760 hours.  BMP: Recent Labs    06/28/23 1618  NA 138  K 3.8  CL 104  CO2 26  GLUCOSE 103*  BUN 14  CALCIUM 9.1  CREATININE 0.85    LIVER FUNCTION TESTS: Recent Labs    06/28/23 1618  BILITOT 0.3  AST 15  ALT 12  ALKPHOS 54  PROT 6.9  ALBUMIN 4.0      Assessment and Plan:  37 y.o. Female outpatient. History of PE (not currently on anticoagulation), iron deficiency requiring transfusion The patient is a difficult IV  stick. Team is requesting a portacath for durable IV access.   PLAN: IR PORTACATH PLACEMENT  Risks and benefits of image guided port-a-catheter placement was discussed with the patient including, but not limited to bleeding, infection, pneumothorax, or fibrin sheath development and need for additional procedures.  All of the patient's questions were answered, patient is agreeable to proceed. Consent signed and in chart.  Thank you for this interesting consult.  I greatly enjoyed meeting Paula Benson and look forward to participating in their care.  A copy of this report was sent to the requesting provider on this date.  Electronically Signed: Alene Mires, NP 11/17/2023, 1:01 PM   I spent a total of  30 Minutes   in face to face in clinical consultation, greater than 50% of which was counseling/coordinating care for portacath placement

## 2023-11-17 ENCOUNTER — Encounter (HOSPITAL_COMMUNITY): Payer: Self-pay

## 2023-11-17 ENCOUNTER — Ambulatory Visit (HOSPITAL_COMMUNITY)
Admission: RE | Admit: 2023-11-17 | Discharge: 2023-11-17 | Disposition: A | Discharge status: Home / Self Care | Payer: No Typology Code available for payment source | Source: Ambulatory Visit | Attending: Hematology and Oncology | Admitting: Hematology and Oncology

## 2023-11-17 ENCOUNTER — Ambulatory Visit (HOSPITAL_COMMUNITY)
Admission: RE | Admit: 2023-11-17 | Discharge: 2023-11-17 | Disposition: A | Discharge status: Home / Self Care | Payer: No Typology Code available for payment source | Source: Ambulatory Visit | Attending: Hematology and Oncology

## 2023-11-17 DIAGNOSIS — D509 Iron deficiency anemia, unspecified: Secondary | ICD-10-CM | POA: Insufficient documentation

## 2023-11-17 DIAGNOSIS — I878 Other specified disorders of veins: Secondary | ICD-10-CM | POA: Diagnosis not present

## 2023-11-17 DIAGNOSIS — Z86711 Personal history of pulmonary embolism: Secondary | ICD-10-CM | POA: Insufficient documentation

## 2023-11-17 HISTORY — PX: IR IMAGING GUIDED PORT INSERTION: IMG5740

## 2023-11-17 MED ORDER — DIPHENHYDRAMINE HCL 50 MG/ML IJ SOLN
INTRAMUSCULAR | Status: AC | PRN
  Administered 2023-11-17: 25 mg via INTRAVENOUS

## 2023-11-17 MED ORDER — FENTANYL CITRATE (PF) 100 MCG/2ML IJ SOLN
INTRAMUSCULAR | Status: AC | PRN
  Administered 2023-11-17 (×2): 50 ug via INTRAVENOUS

## 2023-11-17 MED ORDER — MIDAZOLAM HCL 2 MG/2ML IJ SOLN
INTRAMUSCULAR | Status: AC
Start: 2023-11-17 — End: ?
  Filled 2023-11-17: qty 4

## 2023-11-17 MED ORDER — MIDAZOLAM HCL 2 MG/2ML IJ SOLN
INTRAMUSCULAR | Status: AC | PRN
  Administered 2023-11-17 (×2): 1 mg via INTRAVENOUS

## 2023-11-17 MED ORDER — LIDOCAINE-EPINEPHRINE 1 %-1:100000 IJ SOLN
INTRAMUSCULAR | Status: AC
  Filled 2023-11-17: qty 1

## 2023-11-17 MED ORDER — LIDOCAINE-EPINEPHRINE 1 %-1:100000 IJ SOLN
20.0000 mL | Freq: Once | INTRAMUSCULAR | Status: AC
Start: 2023-11-17 — End: 2023-11-17
  Administered 2023-11-17: 20 mL via INTRADERMAL

## 2023-11-17 MED ORDER — FENTANYL CITRATE (PF) 100 MCG/2ML IJ SOLN
INTRAMUSCULAR | Status: AC
  Filled 2023-11-17: qty 2

## 2023-11-17 MED ORDER — DIPHENHYDRAMINE HCL 50 MG/ML IJ SOLN
INTRAMUSCULAR | Status: AC
  Filled 2023-11-17: qty 1

## 2023-11-17 MED ORDER — HEPARIN SOD (PORK) LOCK FLUSH 100 UNIT/ML IV SOLN
INTRAVENOUS | Status: AC
Start: 2023-11-17 — End: ?
  Filled 2023-11-17: qty 5

## 2023-11-17 MED ORDER — SODIUM CHLORIDE 0.9 % IV SOLN: INTRAVENOUS | Status: DC

## 2023-11-17 MED ORDER — HEPARIN SOD (PORK) LOCK FLUSH 100 UNIT/ML IV SOLN
500.0000 [IU] | Freq: Once | INTRAVENOUS | Status: AC
  Administered 2023-11-17: 500 [IU] via INTRAVENOUS

## 2023-11-17 NOTE — Discharge Instructions (Signed)
Implanted Port Insertion, Care After  The following information offers guidance on how to care for yourself after your procedure. Your health care provider may also give you more specific instructions. If you have problems or questions, contact your health care provider.  What can I expect after the procedure? After the procedure, it is common to have: Discomfort at the port insertion site. Bruising on the skin over the port. This should improve over 3-4 days.   Urgent needs - Interventional Radiology, clinic [phone] (mon-fri 8-5).   Wound - May remove dressing and shower in 24 to 48 hours.  Keep site clean and dry.  Replace with bandaid as needed.  Do not submerge in tub or water until site healing well. If closed with glue, glue will flake off on its own.   If ordered by your provider, may start Emla cream (or any other creams ointments or lotions) in 2 weeks or after incision is healed. Port is ready for use immediately.   After completion of treatment, your provider should have you set up for monthly port flushes.   Follow these instructions at home: Port care After your port is placed, you will get a manufacturer's information card. The card has information about your port. Keep this card with you at all times. Take care of the port as told by your health care provider. Ask your health care provider if you or a family member can get training for taking care of the port at home. A home health care nurse will be be available to help care for the port. Make sure to remember what type of port you have. Incision care     Follow instructions from your health care provider about how to take care of your port insertion site. Make sure you: Wash your hands with soap and water for at least 20 seconds before and after you change your bandage (dressing). If soap and water are not available, use hand sanitizer. Change your dressing as told by your health care provider. Leave stitches  (sutures), skin glue, or adhesive strips in place. These skin closures may need to stay in place for 2 weeks or longer. If adhesive strip edges start to loosen and curl up, you may trim the loose edges. Do not remove adhesive strips completely unless your health care provider tells you to do that. Check your port insertion site every day for signs of infection. Check for: Redness, swelling, or pain. Fluid or blood. Warmth. Pus or a bad smell. Activity Return to your normal activities as told by your health care provider. Ask your health care provider what activities are safe for you. You may have to avoid lifting. Ask your health care provider how much you can safely lift. General instructions Take over-the-counter and prescription medicines only as told by your health care provider. Do not take baths, swim, or use a hot tub until your health care provider approves. Ask your health care provider if you may take showers. You may only be allowed to take sponge baths. If you were given a sedative during the procedure, it can affect you for several hours. Do not drive or operate machinery until your health care provider says that it is safe. Wear a medical alert bracelet in case of an emergency. This will tell any health care providers that you have a port. Keep all follow-up visits. This is important. Contact a health care provider if: You cannot flush your port with saline as directed, or you cannot   draw blood from the port. You have a fever or chills. You have redness, swelling, or pain around your port insertion site. You have fluid or blood coming from your port insertion site. Your port insertion site feels warm to the touch. You have pus or a bad smell coming from the port insertion site. Get help right away if: You have chest pain or shortness of breath. You have bleeding from your port that you cannot control. These symptoms may be an emergency. Get help right away. Call 911. Do not  wait to see if the symptoms will go away. Do not drive yourself to the hospital. Summary Take care of the port as told by your health care provider. Keep the manufacturer's information card with you at all times. Change your dressing as told by your health care provider. Contact a health care provider if you have a fever or chills or if you have redness, swelling, or pain around your port insertion site. Keep all follow-up visits. This information is not intended to replace advice given to you by your health care provider. Make sure you discuss any questions you have with your health care provider. Document Revised: 05/25/2021 Document Reviewed: 05/25/2021 Elsevier Patient Education  2023 Elsevier Inc.    Moderate Conscious Sedation  Adult  Care After (English)  After the procedure, it is common to have: Sleepiness for a few hours. Impaired judgment for a few hours. Trouble with balance. Nausea or vomiting if you eat too soon. Follow these instructions at home: For the time period you were told by your health care provider:  Rest. Do not participate in activities where you could fall or become injured. Do not drive or use machinery. Do not drink alcohol. Do not take sleeping pills or medicines that cause drowsiness. Do not make important decisions or sign legal documents. Do not take care of children on your own. Eating and drinking Follow instructions from your health care provider about what you may eat and drink. Drink enough fluid to keep your urine pale yellow. If you vomit: Drink clear fluids slowly and in small amounts as you are able. Clear fluids include water, ice chips, low-calorie sports drinks, and fruit juice that has water added to it (diluted fruit juice). Eat light and bland foods in small amounts as you are able. These foods include bananas, applesauce, rice, lean meats, toast, and crackers. General instructions Take over-the-counter and prescription medicines  only as told by your health care provider. Have a responsible adult stay with you for the time you are told. Do not use any products that contain nicotine or tobacco. These products include cigarettes, chewing tobacco, and vaping devices, such as e-cigarettes. If you need help quitting, ask your health care provider. Return to your normal activities as told by your health care provider. Ask your health care provider what activities are safe for you. Your health care provider may give you more instructions. Make sure you know what you can and cannot do. Contact a health care provider if: You are still sleepy or having trouble with balance after 24 hours. You feel light-headed. You vomit every time you eat or drink. You get a rash. You have a fever. You have redness or swelling around the IV site. Get help right away if: You have trouble breathing. You start to feel confused at home. These symptoms may be an emergency. Get help right away. Call 911. Do not wait to see if the symptoms will go away. Do not   drive yourself to the hospital. This information is not intended to replace advice given to you by your health care provider. Make sure you discuss any questions you have with your health care provider. 

## 2023-11-17 NOTE — Procedures (Signed)
  Procedure:  R internal jugular Port catheter placement   Preprocedure diagnosis: Diagnoses of Iron deficiency anemia, unspecified iron deficiency anemia type and Poor venous access were pertinent to this visit. Postprocedure diagnosis: same EBL:    minimal Complications:   none immediate  See full dictation in [employer].  Thora Lance MD Main # [phone] Pager  [phone] Mobile [phone]

## 2023-11-18 ENCOUNTER — Inpatient Hospital Stay: Payer: No Typology Code available for payment source | Attending: Hematology and Oncology

## 2023-11-18 VITALS — BP 117/76 | HR 77 | Temp 98.3°F | Resp 16

## 2023-11-18 DIAGNOSIS — E538 Deficiency of other specified B group vitamins: Secondary | ICD-10-CM | POA: Insufficient documentation

## 2023-11-18 DIAGNOSIS — D509 Iron deficiency anemia, unspecified: Secondary | ICD-10-CM | POA: Insufficient documentation

## 2023-11-18 MED ORDER — SODIUM CHLORIDE 0.9 % IV SOLN
300.0000 mg | Freq: Once | INTRAVENOUS | Status: AC
  Administered 2023-11-18: 300 mg via INTRAVENOUS
  Filled 2023-11-18: qty 300

## 2023-11-18 MED ORDER — DIPHENHYDRAMINE HCL 50 MG/ML IJ SOLN
25.0000 mg | Freq: Once | INTRAMUSCULAR | Status: AC
  Administered 2023-11-18: 25 mg via INTRAVENOUS
  Filled 2023-11-18: qty 1

## 2023-11-18 MED ORDER — CYANOCOBALAMIN 1000 MCG/ML IJ SOLN
1000.0000 ug | Freq: Once | INTRAMUSCULAR | Status: AC
  Administered 2023-11-18: 1000 ug via INTRAMUSCULAR
  Filled 2023-11-18: qty 1

## 2023-11-18 NOTE — Patient Instructions (Signed)
Iron Sucrose Injection What is this medication? IRON SUCROSE (EYE ern SOO krose) treats low levels of iron (iron deficiency anemia) in people with kidney disease. Iron is a mineral that plays an important role in making red blood cells, which carry oxygen from your lungs to the rest of your body. This medicine may be used for other purposes; ask your health care provider or pharmacist if you have questions. COMMON BRAND NAME(S): Venofer What should I tell my care team before I take this medication? They need to know if you have any of these conditions: Anemia not caused by low iron levels Heart disease High levels of iron in the blood Kidney disease Liver disease An unusual or allergic reaction to iron, other medications, foods, dyes, or preservatives Pregnant or trying to get pregnant Breastfeeding How should I use this medication? This medication is for infusion into a vein. It is given in a hospital or clinic setting. Talk to your care team about the use of this medication in children. While this medication may be prescribed for children as young as 2 years for selected conditions, precautions do apply. Overdosage: If you think you have taken too much of this medicine contact a poison control center or emergency room at once. NOTE: This medicine is only for you. Do not share this medicine with others. What if I miss a dose? Keep appointments for follow-up doses. It is important not to miss your dose. Call your care team if you are unable to keep an appointment. What may interact with this medication? Do not take this medication with any of the following: Deferoxamine Dimercaprol Other iron products This medication may also interact with the following: Chloramphenicol Deferasirox This list may not describe all possible interactions. Give your health care provider a list of all the medicines, herbs, non-prescription drugs, or dietary supplements you use. Also tell them if you smoke,  drink alcohol, or use illegal drugs. Some items may interact with your medicine. What should I watch for while using this medication? Visit your care team regularly. Tell your care team if your symptoms do not start to get better or if they get worse. You may need blood work done while you are taking this medication. You may need to follow a special diet. Talk to your care team. Foods that contain iron include: whole grains/cereals, dried fruits, beans, or peas, leafy green vegetables, and organ meats (liver, kidney). What side effects may I notice from receiving this medication? Side effects that you should report to your care team as soon as possible: Allergic reactions--skin rash, itching, hives, swelling of the face, lips, tongue, or throat Low blood pressure--dizziness, feeling faint or lightheaded, blurry vision Shortness of breath Side effects that usually do not require medical attention (report to your care team if they continue or are bothersome): Flushing Headache Joint pain Muscle pain Nausea Pain, redness, or irritation at injection site This list may not describe all possible side effects. Call your doctor for medical advice about side effects. You may report side effects to FDA at 1-800-FDA-1088. Where should I keep my medication? This medication is given in a hospital or clinic. It will not be stored at home. NOTE: This sheet is a summary. It may not cover all possible information. If you have questions about this medicine, talk to your doctor, pharmacist, or health care provider.  2024 Elsevier/Gold Standard (2023-04-28 00:00:00)

## 2023-11-18 NOTE — Progress Notes (Signed)
Tolerated infusion well and observed for 30 minutes. VSS, discharged alert and ambulatory.

## 2023-11-20 ENCOUNTER — Encounter: Payer: Self-pay | Admitting: Hematology and Oncology

## 2023-11-20 ENCOUNTER — Encounter: Payer: Self-pay | Admitting: *Deleted

## 2023-11-21 ENCOUNTER — Encounter: Payer: Self-pay | Admitting: Hematology and Oncology

## 2023-11-25 ENCOUNTER — Ambulatory Visit: Payer: No Typology Code available for payment source

## 2023-12-01 ENCOUNTER — Other Ambulatory Visit: Payer: Self-pay | Admitting: Family Medicine

## 2023-12-01 DIAGNOSIS — L309 Dermatitis, unspecified: Secondary | ICD-10-CM

## 2023-12-01 MED FILL — Iron Sucrose Inj 20 MG/ML (Fe Equiv): INTRAVENOUS | Qty: 15 | Status: AC

## 2023-12-02 ENCOUNTER — Telehealth: Payer: Self-pay | Admitting: *Deleted

## 2023-12-02 ENCOUNTER — Ambulatory Visit: Payer: No Typology Code available for payment source

## 2023-12-02 NOTE — Telephone Encounter (Signed)
Pt was No Show for  Iron infusion scheduled for  12/02/23.  Attempted to call pt with no answers.  Left message on voice mail informing pt that provider and collaborative nurse will be notified.  Appt will be rescheduled.

## 2023-12-07 ENCOUNTER — Encounter (INDEPENDENT_AMBULATORY_CARE_PROVIDER_SITE_OTHER): Payer: No Typology Code available for payment source | Admitting: Family Medicine

## 2023-12-07 DIAGNOSIS — B3731 Acute candidiasis of vulva and vagina: Secondary | ICD-10-CM

## 2023-12-07 MED ORDER — FLUCONAZOLE 150 MG PO TABS: 150.0000 mg | ORAL_TABLET | Freq: Once | ORAL | 0 refills | Status: AC

## 2023-12-07 NOTE — Telephone Encounter (Signed)
 Please see the MyChart message reply(ies) for my assessment and plan.  The patient gave consent for this Medical Advice Message and is aware that it may result in a bill to their insurance company as well as the possibility that this may result in a co-payment or deductible. They are an established patient, but are not seeking medical advice exclusively about a problem treated during an in person or video visit in the last 7 days. I did not recommend an in person or video visit within 7 days of my reply.  I spent a total of 10 minutes cumulative time within 7 days through MyChart messaging Paula Schroeder, MD

## 2023-12-08 ENCOUNTER — Ambulatory Visit: Payer: No Typology Code available for payment source

## 2023-12-08 ENCOUNTER — Inpatient Hospital Stay: Payer: No Typology Code available for payment source | Attending: Hematology and Oncology

## 2023-12-08 VITALS — BP 124/79 | HR 77 | Temp 98.3°F | Resp 18

## 2023-12-08 DIAGNOSIS — D509 Iron deficiency anemia, unspecified: Secondary | ICD-10-CM | POA: Insufficient documentation

## 2023-12-08 DIAGNOSIS — E538 Deficiency of other specified B group vitamins: Secondary | ICD-10-CM | POA: Diagnosis not present

## 2023-12-08 MED ORDER — DIPHENHYDRAMINE HCL 50 MG/ML IJ SOLN
25.0000 mg | Freq: Once | INTRAMUSCULAR | Status: AC
  Administered 2023-12-08: 25 mg via INTRAVENOUS
  Filled 2023-12-08: qty 1

## 2023-12-08 MED ORDER — SODIUM CHLORIDE 0.9 % IV SOLN
300.0000 mg | Freq: Once | INTRAVENOUS | Status: AC
  Administered 2023-12-08: 300 mg via INTRAVENOUS
  Filled 2023-12-08: qty 15
  Filled 2023-12-08: qty 300

## 2023-12-08 MED ORDER — CYANOCOBALAMIN 1000 MCG/ML IJ SOLN
1000.0000 ug | Freq: Once | INTRAMUSCULAR | Status: AC
  Administered 2023-12-08: 1000 ug via INTRAMUSCULAR
  Filled 2023-12-08: qty 1

## 2023-12-08 MED ORDER — SODIUM CHLORIDE 0.9 % IV SOLN
INTRAVENOUS | Status: DC
Start: 2023-12-08 — End: 2023-12-08

## 2023-12-08 MED ORDER — HEPARIN SOD (PORK) LOCK FLUSH 100 UNIT/ML IV SOLN
500.0000 [IU] | Freq: Once | INTRAVENOUS | Status: AC | PRN
  Administered 2023-12-08: 500 [IU]

## 2023-12-08 MED ORDER — SODIUM CHLORIDE 0.9% FLUSH
10.0000 mL | Freq: Two times a day (BID) | INTRAVENOUS | Status: DC
  Administered 2023-12-08: 10 mL via INTRAVENOUS

## 2023-12-13 ENCOUNTER — Encounter: Payer: Self-pay | Admitting: Family Medicine

## 2023-12-15 ENCOUNTER — Ambulatory Visit
Admission: RE | Admit: 2023-12-15 | Discharge: 2023-12-15 | Disposition: A | Discharge status: Home / Self Care | Payer: No Typology Code available for payment source | Source: Ambulatory Visit | Attending: Family Medicine | Admitting: Family Medicine

## 2023-12-15 VITALS — BP 147/95 | HR 98 | Temp 98.4°F | Resp 17

## 2023-12-15 DIAGNOSIS — R350 Frequency of micturition: Secondary | ICD-10-CM | POA: Insufficient documentation

## 2023-12-15 DIAGNOSIS — R5383 Other fatigue: Secondary | ICD-10-CM | POA: Insufficient documentation

## 2023-12-15 LAB — POCT URINALYSIS DIP (MANUAL ENTRY)
Bilirubin, UA: NEGATIVE
Blood, UA: NEGATIVE
Glucose, UA: NEGATIVE mg/dL
Ketones, POC UA: NEGATIVE mg/dL
Nitrite, UA: NEGATIVE
Protein Ur, POC: NEGATIVE mg/dL
Spec Grav, UA: 1.025
Urobilinogen, UA: 0.2 U/dL
pH, UA: 5.5

## 2023-12-15 LAB — POCT URINE PREGNANCY: Preg Test, Ur: NEGATIVE

## 2023-12-15 LAB — POCT FASTING CBG KUC MANUAL ENTRY: POCT Glucose (KUC): 89 mg/dL (ref 70–99)

## 2023-12-15 MED ORDER — CEPHALEXIN 500 MG PO CAPS: 500.0000 mg | ORAL_CAPSULE | Freq: Two times a day (BID) | ORAL | 0 refills | Status: AC

## 2023-12-15 NOTE — ED Provider Notes (Signed)
 [facility]    CSN: [id number] Arrival date & time: 12/15/23  1333      History   Chief Complaint Chief Complaint  Patient presents with   Fatigue    Frequent UTI, Sever Thirst, Fatigue - Entered by patient    HPI Paula Benson is a 38 y.o. female past medical history of anemia, bipolar 2 asthma, anxiety, depression, migraines, PE presents for urinary frequency and fatigue.  Patient reports yesterday she had frequent urination with extreme thirst.  Also states she has been feeling more tired than normal.  She does have a history of iron  deficiency anemia and does receive infusions for this.  Denies any fevers, nausea/vomiting, flank pain.  No vaginal discharge or STD concern.  States her PCP wanted her to get checked for UTI and diabetes.  She reports history of prediabetes only.  States she last ate about 2 hours ago.  Is only been drinking water today.  No other concerns at this time.  HPI  Past Medical History:  Diagnosis Date   Anemia    Anxiety    Asthma    Bipolar II disorder (HCC)    Complication of anesthesia 2003   ASPIRATED DURING EMERGENCY CS   Depression    doing ok   Eclamptic seizure 2003   Fibroid    Genital herpes    Headache(784.0)    Herpes    History of blood clots    x3   History of blood transfusion MARCH 2013   Menometrorrhagia 07/27/2016   Migraines    Personal history of PE (pulmonary embolism) x 3, latest 02/2010   noncompliant with INR checks, must limit refills   Preeclampsia 2003   with first pregnancy   Pregnancy induced hypertension 2003   seized 2wks after   Pyelonephritis 2012   Seizures (HCC) 2003   after first delivery   Suicidal ideations    Urinary tract infection     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HCC) 12/31/2015   CAP (community acquired pneumonia) 11/02/2014   Pyelonephritis 10/31/2014   Hypokalemia 10/31/2014   Neck pain  02/05/2014   Migraine headache 08/14/2013   Palpitations 04/12/2012   History of aspiration pneumonitis 02/20/2012   Iron  deficiency anemia 02/20/2012   Recurrent urinary tract infection 08/25/2011   Genital herpes 06/09/2011   Panic disorder 03/21/2011   Chronic anticoagulation 03/17/2011   ASTHMA, INTERMITTENT 03/04/2010    Past Surgical History:  Procedure Laterality Date   CESAREAN SECTION  12/2001 and 01/2008   CESAREAN SECTION  06/26/2012   Procedure: CESAREAN SECTION;  Surgeon: Elveria Mungo, MD;  Location: [facility];  Service: Obstetrics;  Laterality: N/A;   INDUCED ABORTION  01/2010   IR IMAGING GUIDED PORT INSERTION  11/17/2023    OB History     Gravida  4   Para  3   Term  3   Preterm  0   AB  1   Living  3      SAB  0   IAB  1   Ectopic  0   Multiple  0   Live Births  3            Home Medications    Prior to Admission medications   Medication Sig Start Date End Date Taking? Authorizing Provider  cephALEXin  (KEFLEX ) 500 MG capsule Take 1 capsule (500 mg total) by mouth 2 (two) times daily  for 7 days. 12/15/23 12/22/23 Yes Havah Ammon, Jodi R, NP  acetaminophen  (TYLENOL ) 500 MG tablet Take 1,000 mg by mouth every 6 (six) hours as needed for mild pain, moderate pain or headache.    [provider]  acyclovir  (ZOVIRAX ) 200 MG capsule Take 2 capsules (400 mg total) by mouth 2 (two) times daily. Use for HSV suppression.  If outbreak take 800 mg TID for 2 days 04/17/23   Copland, Harlene BROCKS, MD  albuterol  (VENTOLIN  HFA) 108 (90 Base) MCG/ACT inhaler Inhale 2 puffs into the lungs every 6 (six) hours as needed for wheezing or shortness of breath. 06/15/22   Copland, Harlene BROCKS, MD  ALPRAZolam  (XANAX ) 0.25 MG tablet Take 1 tablet (0.25 mg total) by mouth 3 (three) times daily as needed for anxiety. 07/28/22   Copland, Jessica C, MD  ARIPiprazole (ABILIFY) 15 MG tablet Take 1 tablet (15 mg total) by mouth daily. 07/28/22   Copland, Jessica C, MD   busPIRone (BUSPAR) 10 MG tablet Take 1 tablet (10 mg total) by mouth 2 (two) times daily. 07/28/22   Copland, Jessica C, MD  Fluocinolone  Acetonide Scalp 0.01 % OIL Apply to scalp daily as needed, wash out 8- 12 hours later 04/13/22   Copland, Harlene BROCKS, MD  lidocaine -prilocaine  (EMLA ) cream Apply 1 Application topically as needed. 11/10/23   Gudena, Vinay, MD  lurasidone (LATUDA) 40 MG TABS tablet Take 40 mg by mouth daily. 09/15/22   [provider]  nitrofurantoin , macrocrystal-monohydrate, (MACROBID ) 100 MG capsule Take 1 capsule (100 mg total) by mouth 2 (two) times daily. 07/07/23   Copland, Harlene BROCKS, MD  OZEMPIC , 0.25 OR 0.5 MG/DOSE, 2 MG/3ML SOPN DIAL AND INJECT UNDER THE SKIN 0.5 MG WEEKLY 01/13/23   Copland, Harlene BROCKS, MD  Rimegepant Sulfate (NURTEC) 75 MG TBDP Take every other day for headache prevention 06/28/23   Copland, Jessica C, MD  tiZANidine  (ZANAFLEX ) 4 MG tablet Take 1 tablet (4 mg total) by mouth every 8 (eight) hours as needed for muscle spasms. 09/14/22   Rush Nest, MD  triamcinolone  ointment (KENALOG ) 0.5 % APPLY 1 APPLICATION TO AFFECTED AREA(S) TWO TIMES A DAY AS NEEDED - DO NOT USE ON FACE 12/04/23   Copland, Harlene BROCKS, MD  Vitamin D, Ergocalciferol, (DRISDOL) 1.25 MG (50000 UNIT) CAPS capsule Take 50,000 Units by mouth once a week. 02/08/22   [provider]    Family History Family History  Problem Relation Age of Onset   Diabetes Mother        Type 2   Anemia Sister    Hypertension Sister    Breast cancer Maternal Aunt    Anesthesia problems Neg Hx     Social History Social History   Tobacco Use   Smoking status: Never   Smokeless tobacco: Never  Vaping Use   Vaping status: Never Used  Substance Use Topics   Alcohol  use: No   Drug use: No     Allergies   Patient has no known allergies.   Review of Systems Review of Systems  Constitutional:  Positive for fatigue.  Endocrine: Positive for polydipsia.  Genitourinary:  Positive  for frequency.     Physical Exam Triage Vital Signs ED Triage Vitals [12/15/23 1343]  Encounter Vitals Group     BP (!) 147/95     Systolic BP Percentile      Diastolic BP Percentile      Pulse Rate 98     Resp 17     Temp 98.4  F (36.9 C)     Temp Source Oral     SpO2 98 %     Weight      Height      Head Circumference      Peak Flow      Pain Score 0     Pain Loc      Pain Education      Exclude from Growth Chart    No data found.  Updated Vital Signs BP (!) 147/95   Pulse 98   Temp 98.4 F (36.9 C) (Oral)   Resp 17   LMP 11/28/2023 (Exact Date)   SpO2 98%   Visual Acuity Right Eye Distance:   Left Eye Distance:   Bilateral Distance:    Right Eye Near:   Left Eye Near:    Bilateral Near:     Physical Exam   UC Treatments / Results  Labs (all labs ordered are listed, but only abnormal results are displayed) Labs Reviewed  POCT URINALYSIS DIP (MANUAL ENTRY) - Abnormal; Notable for the following components:      Result Value   Leukocytes, UA Trace (*)    All other components within normal limits  URINE CULTURE  POCT URINE PREGNANCY  POCT FASTING CBG K[facility] MANUAL ENTRY    EKG   Radiology No results found.  Procedures Procedures (including critical care time)  Medications Ordered in UC Medications - No data to display  Initial Impression / Assessment and Plan / UC Course  I have reviewed the triage vital signs and the nursing notes.  Pertinent labs & imaging results that were available during my care of the patient were reviewed by me and considered in my medical decision making (see chart for details).     Reviewed exam and sx with patient, no red flags. UA +UTI, will culture and start keflex .  Blood sugar within normal limits.  Advised patient to continue increase water/electrolyte intake and follow-up with PCP if symptoms do not improve.  ER precautions reviewed. Final Clinical Impressions(s) / UC Diagnoses   Final diagnoses:  Other  fatigue  Urinary frequency     Discharge Instructions      The clinic will contact you with results of the urine culture done today if positive.  Start Keflex  twice daily for 7 days.  Lots of rest and fluids.  Please follow-up with your PCP if your symptoms do not improve.  Please go to the ER for any worsening symptoms.  Hope you feel better soon!     ED Prescriptions     Medication Sig Dispense Auth. Provider   cephALEXin  (KEFLEX ) 500 MG capsule Take 1 capsule (500 mg total) by mouth 2 (two) times daily for 7 days. 14 capsule Faraaz Wolin, Jodi R, NP      PDMP not reviewed this encounter.   Loreda Myla SAUNDERS, NP 12/15/23 1434

## 2023-12-15 NOTE — ED Triage Notes (Signed)
 Pt presents for frequent UTI'S and severe thirst. Pt states she has been feeling fatigue.

## 2023-12-15 NOTE — Discharge Instructions (Signed)
 The clinic will contact you with results of the urine culture done today if positive.  Start Keflex twice daily for 7 days.  Lots of rest and fluids.  Please follow-up with your PCP if your symptoms do not improve.  Please go to the ER for any worsening symptoms.  Hope you feel better soon!

## 2023-12-18 LAB — URINE CULTURE: Culture: 20000 — AB

## 2023-12-18 NOTE — Telephone Encounter (Signed)
 Please see the MyChart message reply(ies) for my assessment and plan.  The patient gave consent for this Medical Advice Message and is aware that it may result in a bill to their insurance company as well as the possibility that this may result in a co-payment or deductible. They are an established patient, but are not seeking medical advice exclusively about a problem treated during an in person or video visit in the last 7 days. I did not recommend an in person or video visit within 7 days of my reply.  I spent a total of 15 minutes cumulative time within 7 days through [employer] Abbe Amsterdam, MD

## 2023-12-20 ENCOUNTER — Encounter (INDEPENDENT_AMBULATORY_CARE_PROVIDER_SITE_OTHER): Payer: No Typology Code available for payment source | Admitting: Family Medicine

## 2023-12-20 DIAGNOSIS — R899 Unspecified abnormal finding in specimens from other organs, systems and tissues: Secondary | ICD-10-CM

## 2023-12-20 NOTE — Telephone Encounter (Signed)

## 2024-01-03 ENCOUNTER — Encounter (INDEPENDENT_AMBULATORY_CARE_PROVIDER_SITE_OTHER): Payer: No Typology Code available for payment source | Admitting: Family Medicine

## 2024-01-03 ENCOUNTER — Telehealth: Payer: No Typology Code available for payment source | Admitting: Family Medicine

## 2024-01-03 DIAGNOSIS — R5381 Other malaise: Secondary | ICD-10-CM

## 2024-01-03 DIAGNOSIS — J111 Influenza due to unidentified influenza virus with other respiratory manifestations: Secondary | ICD-10-CM | POA: Diagnosis not present

## 2024-01-03 MED ORDER — OSELTAMIVIR PHOSPHATE 75 MG PO CAPS: 75.0000 mg | ORAL_CAPSULE | Freq: Two times a day (BID) | ORAL | 0 refills | Status: AC

## 2024-01-03 NOTE — Patient Instructions (Signed)

## 2024-01-03 NOTE — Progress Notes (Signed)
Virtual Visit Consent   [name], you are scheduled for a virtual visit with a [facility] provider today. Just as with appointments in the office, your consent must be obtained to participate. Your consent will be active for this visit and any virtual visit you may have with one of our providers in the next 365 days. If you have a MyChart account, a copy of this consent can be sent to you electronically.  As this is a virtual visit, video technology does not allow for your provider to perform a traditional examination. This may limit your provider's ability to fully assess your condition. If your provider identifies any concerns that need to be evaluated in person or the need to arrange testing (such as labs, EKG, etc.), we will make arrangements to do so. Although advances in technology are sophisticated, we cannot ensure that it will always work on either your end or our end. If the connection with a video visit is poor, the visit may have to be switched to a telephone visit. With either a video or telephone visit, we are not always able to ensure that we have a secure connection.  By engaging in this virtual visit, you consent to the provision of healthcare and authorize for your insurance to be billed (if applicable) for the services provided during this visit. Depending on your insurance coverage, you may receive a charge related to this service.  I need to obtain your verbal consent now. Are you willing to proceed with your visit today? Paula Benson has provided verbal consent on 01/03/2024 for a virtual visit (video or telephone). Georgana Curio, FNP  Date: 01/03/2024 6:36 PM  Virtual Visit via Video Note   I, Georgana Curio, connected with  HELLEN SHANLEY  ([id number], 1986-09-04) on 01/03/24 at  6:30 PM EST by a video-enabled telemedicine application and verified that I am speaking with the correct person using two identifiers.  Location: Patient: Virtual Visit Location Patient:  Home Provider: Virtual Visit Location Provider: Home Office   I discussed the limitations of evaluation and management by telemedicine and the availability of in person appointments. The patient expressed understanding and agreed to proceed.    History of Present Illness: Paula Benson is a 38 y.o. who identifies as a female who was assigned female at birth, and is being seen today for chills, body aches, head congestion for 2 days worsening. Marland Kitchen  HPI: HPI  Problems:  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HCC) 12/31/2015   CAP (community acquired pneumonia) 11/02/2014   Pyelonephritis 10/31/2014   Hypokalemia 10/31/2014   Neck pain 02/05/2014   Migraine headache 08/14/2013   Palpitations 04/12/2012   History of aspiration pneumonitis 02/20/2012   Iron deficiency anemia 02/20/2012   Recurrent urinary tract infection 08/25/2011   Genital herpes 06/09/2011   Panic disorder 03/21/2011   Chronic anticoagulation 03/17/2011   ASTHMA, INTERMITTENT 03/04/2010    Allergies: No Known Allergies Medications:  Current Outpatient Medications:    acetaminophen (TYLENOL) 500 MG tablet, Take 1,000 mg by mouth every 6 (six) hours as needed for mild pain, moderate pain or headache., Disp: , Rfl:    acyclovir (ZOVIRAX) 200 MG capsule, Take 2 capsules (400 mg total) by mouth 2 (two) times daily. Use for HSV suppression.  If outbreak take 800 mg TID for 2 days, Disp: 400 capsule, Rfl: 3   albuterol (VENTOLIN HFA) 108 (90 Base) MCG/ACT  inhaler, Inhale 2 puffs into the lungs every 6 (six) hours as needed for wheezing or shortness of breath., Disp: 1 each, Rfl: 6   ALPRAZolam (XANAX) 0.25 MG tablet, Take 1 tablet (0.25 mg total) by mouth 3 (three) times daily as needed for anxiety., Disp: 20 tablet, Rfl: 0   ARIPiprazole (ABILIFY) 15 MG tablet, Take 1 tablet (15 mg total) by mouth daily., Disp: 30 tablet, Rfl: 0   busPIRone  (BUSPAR) 10 MG tablet, Take 1 tablet (10 mg total) by mouth 2 (two) times daily., Disp: 60 tablet, Rfl: 0   Fluocinolone Acetonide Scalp 0.01 % OIL, Apply to scalp daily as needed, wash out 8- 12 hours later, Disp: 118 mL, Rfl: 1   lidocaine-prilocaine (EMLA) cream, Apply 1 Application topically as needed., Disp: 30 g, Rfl: 0   lurasidone (LATUDA) 40 MG TABS tablet, Take 40 mg by mouth daily., Disp: , Rfl:    nitrofurantoin, macrocrystal-monohydrate, (MACROBID) 100 MG capsule, Take 1 capsule (100 mg total) by mouth 2 (two) times daily., Disp: 14 capsule, Rfl: 0   OZEMPIC, 0.25 OR 0.5 MG/DOSE, 2 MG/3ML SOPN, DIAL AND INJECT UNDER THE SKIN 0.5 MG WEEKLY, Disp: 3 mL, Rfl: 1   Rimegepant Sulfate (NURTEC) 75 MG TBDP, Take every other day for headache prevention, Disp: 15 tablet, Rfl: 2   tiZANidine (ZANAFLEX) 4 MG tablet, Take 1 tablet (4 mg total) by mouth every 8 (eight) hours as needed for muscle spasms., Disp: 30 tablet, Rfl: 6   triamcinolone ointment (KENALOG) 0.5 %, APPLY 1 APPLICATION TO AFFECTED AREA(S) TWO TIMES A DAY AS NEEDED - DO NOT USE ON FACE, Disp: 60 g, Rfl: 1   Vitamin D, Ergocalciferol, (DRISDOL) 1.25 MG (50000 UNIT) CAPS capsule, Take 50,000 Units by mouth once a week., Disp: , Rfl:   Observations/Objective: Patient is well-developed, well-nourished in no acute distress.  Resting comfortably  at home.  Head is normocephalic, atraumatic.  No labored breathing.  Speech is clear and coherent with logical content.  Patient is alert and oriented at baseline.    Assessment and Plan: 1. Influenza-like illness (Primary)  Increase fluids, tylenol or ibuprofen as directed, UC if sx persist or worsen  Follow Up Instructions: I discussed the assessment and treatment plan with the patient. The patient was provided an opportunity to ask questions and all were answered. The patient agreed with the plan and demonstrated an understanding of the instructions.  A copy of instructions were sent  to the patient via MyChart unless otherwise noted below.     The patient was advised to call back or seek an in-person evaluation if the symptoms worsen or if the condition fails to improve as anticipated.    Georgana Curio, FNP

## 2024-01-03 NOTE — Telephone Encounter (Signed)

## 2024-01-07 ENCOUNTER — Other Ambulatory Visit: Payer: Self-pay | Admitting: Hematology and Oncology

## 2024-01-08 ENCOUNTER — Encounter: Payer: Self-pay | Admitting: Hematology and Oncology

## 2024-01-18 ENCOUNTER — Inpatient Hospital Stay: Payer: No Typology Code available for payment source | Attending: Hematology and Oncology

## 2024-01-18 DIAGNOSIS — D509 Iron deficiency anemia, unspecified: Secondary | ICD-10-CM | POA: Diagnosis present

## 2024-01-18 MED ORDER — SODIUM CHLORIDE 0.9% FLUSH
10.0000 mL | Freq: Once | INTRAVENOUS | Status: AC
  Administered 2024-01-18: 10 mL

## 2024-01-18 MED ORDER — HEPARIN SOD (PORK) LOCK FLUSH 100 UNIT/ML IV SOLN
500.0000 [IU] | Freq: Once | INTRAVENOUS | Status: AC
  Administered 2024-01-18: 500 [IU]

## 2024-01-30 ENCOUNTER — Encounter: Payer: Self-pay | Admitting: Hematology and Oncology

## 2024-02-02 ENCOUNTER — Inpatient Hospital Stay: Payer: No Typology Code available for payment source

## 2024-02-02 DIAGNOSIS — D509 Iron deficiency anemia, unspecified: Secondary | ICD-10-CM | POA: Diagnosis not present

## 2024-02-02 DIAGNOSIS — E538 Deficiency of other specified B group vitamins: Secondary | ICD-10-CM

## 2024-02-02 LAB — CBC WITH DIFFERENTIAL (CANCER CENTER ONLY)
Abs Immature Granulocytes: 0.02 10*3/uL (ref 0.00–0.07)
Basophils Absolute: 0.1 10*3/uL (ref 0.0–0.1)
Basophils Relative: 1 %
Eosinophils Absolute: 0.1 10*3/uL (ref 0.0–0.5)
Eosinophils Relative: 2 %
HCT: 34.7 % — ABNORMAL LOW (ref 36.0–46.0)
Hemoglobin: 11.5 g/dL — ABNORMAL LOW (ref 12.0–15.0)
Immature Granulocytes: 0 %
Lymphocytes Relative: 39 %
Lymphs Abs: 2.2 10*3/uL (ref 0.7–4.0)
MCH: 29.1 pg (ref 26.0–34.0)
MCHC: 33.1 g/dL (ref 30.0–36.0)
MCV: 87.8 fL (ref 80.0–100.0)
Monocytes Absolute: 0.4 10*3/uL (ref 0.1–1.0)
Monocytes Relative: 6 %
Neutro Abs: 3 10*3/uL (ref 1.7–7.7)
Neutrophils Relative %: 52 %
Platelet Count: 368 10*3/uL (ref 150–400)
RBC: 3.95 MIL/uL (ref 3.87–5.11)
RDW: 15.5 % (ref 11.5–15.5)
WBC Count: 5.8 10*3/uL (ref 4.0–10.5)
nRBC: 0 % (ref 0.0–0.2)

## 2024-02-02 LAB — IRON AND IRON BINDING CAPACITY (CC-WL,HP ONLY)
Iron: 32 ug/dL (ref 28–170)
Saturation Ratios: 8 % — ABNORMAL LOW (ref 10.4–31.8)
TIBC: 412 ug/dL (ref 250–450)
UIBC: 380 ug/dL (ref 148–442)

## 2024-02-02 LAB — VITAMIN B12: Vitamin B-12: 189 pg/mL (ref 180–914)

## 2024-02-02 LAB — FERRITIN: Ferritin: 9 ng/mL — ABNORMAL LOW (ref 11–307)

## 2024-02-02 MED ORDER — HEPARIN SOD (PORK) LOCK FLUSH 100 UNIT/ML IV SOLN
500.0000 [IU] | Freq: Once | INTRAVENOUS | Status: AC
Start: 2024-02-02 — End: 2024-02-02
  Administered 2024-02-02: 500 [IU]

## 2024-02-02 MED ORDER — SODIUM CHLORIDE 0.9% FLUSH
10.0000 mL | Freq: Once | INTRAVENOUS | Status: AC
  Administered 2024-02-02: 10 mL

## 2024-02-05 ENCOUNTER — Inpatient Hospital Stay
Payer: No Typology Code available for payment source | Attending: Hematology and Oncology | Admitting: Hematology and Oncology

## 2024-02-05 ENCOUNTER — Telehealth: Payer: Self-pay

## 2024-02-05 DIAGNOSIS — D509 Iron deficiency anemia, unspecified: Secondary | ICD-10-CM | POA: Diagnosis not present

## 2024-02-05 NOTE — Progress Notes (Signed)
 Patient Care Team: Copland, Gwenlyn Found, MD as PCP - General (Family Medicine)  DIAGNOSIS:  Encounter Diagnosis  Name Primary?   Iron deficiency anemia, unspecified iron deficiency anemia type Yes      CHIEF COMPLIANT: Iron def anemia  HISTORY OF PRESENT ILLNESS:   History of Present Illness The patient, with a history of iron deficiency anemia, presents for follow-up after receiving iron infusions in December. Despite the treatment, her ferritin remains at nine. She denies any known sources of bleeding and has recently seen a gynecologist to rule out fibroids as a cause of blood loss. The patient also reports a history of blood clots and pulmonary embolism, but has not been on anticoagulation therapy for the past ten years. She has been experiencing symptoms of fatigue and ice chip cravings, which prompted her to request a blood test. The patient's B12 level was found to be 189, despite receiving B12 with her iron infusions.     ALLERGIES:  has no known allergies.  MEDICATIONS:  Current Outpatient Medications  Medication Sig Dispense Refill   acetaminophen (TYLENOL) 500 MG tablet Take 1,000 mg by mouth every 6 (six) hours as needed for mild pain, moderate pain or headache.     acyclovir (ZOVIRAX) 200 MG capsule Take 2 capsules (400 mg total) by mouth 2 (two) times daily. Use for HSV suppression.  If outbreak take 800 mg TID for 2 days 400 capsule 3   albuterol (VENTOLIN HFA) 108 (90 Base) MCG/ACT inhaler Inhale 2 puffs into the lungs every 6 (six) hours as needed for wheezing or shortness of breath. 1 each 6   ALPRAZolam (XANAX) 0.25 MG tablet Take 1 tablet (0.25 mg total) by mouth 3 (three) times daily as needed for anxiety. 20 tablet 0   ARIPiprazole (ABILIFY) 15 MG tablet Take 1 tablet (15 mg total) by mouth daily. 30 tablet 0   busPIRone (BUSPAR) 10 MG tablet Take 1 tablet (10 mg total) by mouth 2 (two) times daily. 60 tablet 0   Fluocinolone Acetonide Scalp 0.01 % OIL Apply to  scalp daily as needed, wash out 8- 12 hours later 118 mL 1   lidocaine-prilocaine (EMLA) cream APPLY TOPICALLY AS NEEDED 30 g 0   lurasidone (LATUDA) 40 MG TABS tablet Take 40 mg by mouth daily.     nitrofurantoin, macrocrystal-monohydrate, (MACROBID) 100 MG capsule Take 1 capsule (100 mg total) by mouth 2 (two) times daily. 14 capsule 0   OZEMPIC, 0.25 OR 0.5 MG/DOSE, 2 MG/3ML SOPN DIAL AND INJECT UNDER THE SKIN 0.5 MG WEEKLY 3 mL 1   Rimegepant Sulfate (NURTEC) 75 MG TBDP Take every other day for headache prevention 15 tablet 2   tiZANidine (ZANAFLEX) 4 MG tablet Take 1 tablet (4 mg total) by mouth every 8 (eight) hours as needed for muscle spasms. 30 tablet 6   triamcinolone ointment (KENALOG) 0.5 % APPLY 1 APPLICATION TO AFFECTED AREA(S) TWO TIMES A DAY AS NEEDED - DO NOT USE ON FACE 60 g 1   Vitamin D, Ergocalciferol, (DRISDOL) 1.25 MG (50000 UNIT) CAPS capsule Take 50,000 Units by mouth once a week.     No current facility-administered medications for this visit.    PHYSICAL EXAMINATION: ECOG PERFORMANCE STATUS: 1 - Symptomatic but completely ambulatory  There were no vitals filed for this visit. There were no vitals filed for this visit.  Physical Exam   (exam performed in the presence of a chaperone)  LABORATORY DATA:  I have reviewed the data as listed  Latest Ref Rng & Units 06/28/2023    4:18 PM 10/05/2022   11:14 AM 05/05/2022    3:05 PM  CMP  Glucose 70 - 99 mg/dL 161  84  82   BUN 6 - 23 mg/dL 14  12  14    Creatinine 0.40 - 1.20 mg/dL 0.96  0.45  4.09   Sodium 135 - 145 mEq/L 138  135  136   Potassium 3.5 - 5.1 mEq/L 3.8  4.3  4.1   Chloride 96 - 112 mEq/L 104  103  103   CO2 19 - 32 mEq/L 26  25  25    Calcium 8.4 - 10.5 mg/dL 9.1  9.2  9.4   Total Protein 6.0 - 8.3 g/dL 6.9  7.5  7.3   Total Bilirubin 0.2 - 1.2 mg/dL 0.3  0.4  0.3   Alkaline Phos 39 - 117 U/L 54  58  54   AST 0 - 37 U/L 15  16  15    ALT 0 - 35 U/L 12  16  14      Lab Results  Component Value  Date   WBC 5.8 02/02/2024   HGB 11.5 (L) 02/02/2024   HCT 34.7 (L) 02/02/2024   MCV 87.8 02/02/2024   PLT 368 02/02/2024   NEUTROABS 3.0 02/02/2024    ASSESSMENT & PLAN:  Iron deficiency anemia (She had a prior history of multiple PEs and had seen Dr. Myna Hidalgo previously)   IV iron: March 2023, October 2023, 07/07/2023, December 2024   Labs  02/06/2022: Ferritin 2.6, TIBC 669, there was no hemoglobin available to review. 02/17/2022: Hemoglobin 7.9, MCV 66, RDW 21.2, platelets 593 06/28/23: Hemoglobin: 10.6, Ferritin: 3.6 10/19/2023: Hemoglobin 11.1, MCV 85.8, B12 159, iron saturation 3%, ferritin 6 02/02/2024: Hemoglobin 11.5, MCV 87.8, iron saturation 8%, ferritin 9, B12 189 (ice chip cravings)   Cause: unclear. No gyn bleeding  Difficulty performing tasks with pain in the hands, unexplained bruising: Workup including platelet function assay: Normal, von Willebrand factor: Normal, factor VIII: 98% von Willebrand factor antigen: 86%  Treatment plan: I recommend continued IV iron treatment.  I also recommended B12 injections since the B12 levels are not improving significantly.  Return to clinic with 3 months with labs and telephone visit after that to discuss results ------------------------------------- Assessment and Plan Assessment & Plan Iron Deficiency Anemia Persistent despite iron infusions. No known source of bleeding. Fatigue and pica symptoms present. -Order more iron infusions. -Refer to gastroenterology for upper and lower endoscopy to rule out GI bleeding. -Recheck labs in 3 months.  Vitamin B12 Deficiency Despite receiving B12 with iron infusions, levels remain low, suggesting possible malabsorption. -Start sublingual B12 supplementation.  History of Pulmonary Embolism No current anticoagulation therapy. Last episode 10 years ago during pregnancy. -No new anticoagulation therapy at this time due to potential bleeding risk.      No orders of the defined types  were placed in this encounter.  The patient has a good understanding of the overall plan. she agrees with it. she will call with any problems that may develop before the next visit here. Total time spent: 30 mins including face to face time and time spent for planning, charting and co-ordination of care   Tamsen Meek, MD 02/05/24

## 2024-02-05 NOTE — Assessment & Plan Note (Signed)
(  She had a prior history of multiple PEs and had seen Dr. Myna Hidalgo previously)   IV iron: March 2023, October 2023, 07/07/2023, December 2024   Labs  02/06/2022: Ferritin 2.6, TIBC 669, there was no hemoglobin available to review. 02/17/2022: Hemoglobin 7.9, MCV 66, RDW 21.2, platelets 593 06/28/23: Hemoglobin: 10.6, Ferritin: 3.6 10/19/2023: Hemoglobin 11.1, MCV 85.8, B12 159, iron saturation 3%, ferritin 6 02/02/2024: Hemoglobin 11.5, MCV 87.8, iron saturation 8%, ferritin 9, B12 189   Difficulty performing tasks with pain in the hands, unexplained bruising: Workup including platelet function assay: Normal, von Willebrand factor: Normal, factor VIII: 98% von Willebrand factor antigen: 86%  Treatment plan: I recommend continued IV iron treatment.  I also recommended B12 injections since the B12 levels are not improving significantly.  Return to clinic with 3 months with labs and telephone visit after that to discuss results

## 2024-02-05 NOTE — Telephone Encounter (Signed)
 Dr. [name], patient will be scheduled as soon as possible.  Auth Submission: NO AUTH NEEDED Site of care: Site of care: [facility] Payer: [employer] commercial and [employer] medicaid Medication & CPT/J Code(s) submitted: Venofer (Iron Sucrose) J1756 Route of submission (phone, fax, portal):  Phone # Fax # Auth type: Buy/Bill PB Units/visits requested: 300mg  x 3 doses Reference number:  Approval from: 02/05/24 to 08/07/24

## 2024-02-10 ENCOUNTER — Encounter: Payer: Self-pay | Admitting: Family Medicine

## 2024-02-10 ENCOUNTER — Telehealth: Admitting: Physician Assistant

## 2024-02-10 DIAGNOSIS — U071 COVID-19: Secondary | ICD-10-CM

## 2024-02-10 DIAGNOSIS — E611 Iron deficiency: Secondary | ICD-10-CM

## 2024-02-10 DIAGNOSIS — R110 Nausea: Secondary | ICD-10-CM

## 2024-02-10 DIAGNOSIS — J209 Acute bronchitis, unspecified: Secondary | ICD-10-CM

## 2024-02-10 MED ORDER — FLUTICASONE PROPIONATE 50 MCG/ACT NA SUSP: 2.0000 | Freq: Every day | NASAL | 6 refills | Status: AC

## 2024-02-10 MED ORDER — BENZONATATE 100 MG PO CAPS: 100.0000 mg | ORAL_CAPSULE | Freq: Two times a day (BID) | ORAL | 0 refills | Status: AC | PRN

## 2024-02-10 MED ORDER — CETIRIZINE HCL 10 MG PO TABS: 10.0000 mg | ORAL_TABLET | Freq: Every day | ORAL | 0 refills | Status: AC

## 2024-02-10 NOTE — Patient Instructions (Signed)
 Paula Benson, thank you for joining [name], PA-C for today's virtual visit.  While this provider is not your primary care provider (PCP), if your PCP is located in our provider database this encounter information will be shared with them immediately following your visit.   A [facility] MyChart account gives you access to today's visit and all your visits, tests, and labs performed at [facility] " click here if you don't have a [facility] MyChart account or go to [email or web address]  Consent: (Patient) Paula Benson provided verbal consent for this virtual visit at the beginning of the encounter.  Current Medications:  Current Outpatient Medications:    acetaminophen (TYLENOL) 500 MG tablet, Take 1,000 mg by mouth every 6 (six) hours as needed for mild pain, moderate pain or headache., Disp: , Rfl:    acyclovir (ZOVIRAX) 200 MG capsule, Take 2 capsules (400 mg total) by mouth 2 (two) times daily. Use for HSV suppression.  If outbreak take 800 mg TID for 2 days, Disp: 400 capsule, Rfl: 3   albuterol (VENTOLIN HFA) 108 (90 Base) MCG/ACT inhaler, Inhale 2 puffs into the lungs every 6 (six) hours as needed for wheezing or shortness of breath., Disp: 1 each, Rfl: 6   ALPRAZolam (XANAX) 0.25 MG tablet, Take 1 tablet (0.25 mg total) by mouth 3 (three) times daily as needed for anxiety., Disp: 20 tablet, Rfl: 0   ARIPiprazole (ABILIFY) 15 MG tablet, Take 1 tablet (15 mg total) by mouth daily., Disp: 30 tablet, Rfl: 0   busPIRone (BUSPAR) 10 MG tablet, Take 1 tablet (10 mg total) by mouth 2 (two) times daily., Disp: 60 tablet, Rfl: 0   Fluocinolone Acetonide Scalp 0.01 % OIL, Apply to scalp daily as needed, wash out 8- 12 hours later, Disp: 118 mL, Rfl: 1   lidocaine-prilocaine (EMLA) cream, APPLY TOPICALLY AS NEEDED, Disp: 30 g, Rfl: 0   lurasidone (LATUDA) 40 MG TABS tablet, Take 40 mg by mouth daily., Disp: , Rfl:    nitrofurantoin, macrocrystal-monohydrate,  (MACROBID) 100 MG capsule, Take 1 capsule (100 mg total) by mouth 2 (two) times daily., Disp: 14 capsule, Rfl: 0   OZEMPIC, 0.25 OR 0.5 MG/DOSE, 2 MG/3ML SOPN, DIAL AND INJECT UNDER THE SKIN 0.5 MG WEEKLY, Disp: 3 mL, Rfl: 1   Rimegepant Sulfate (NURTEC) 75 MG TBDP, Take every other day for headache prevention, Disp: 15 tablet, Rfl: 2   tiZANidine (ZANAFLEX) 4 MG tablet, Take 1 tablet (4 mg total) by mouth every 8 (eight) hours as needed for muscle spasms., Disp: 30 tablet, Rfl: 6   triamcinolone ointment (KENALOG) 0.5 %, APPLY 1 APPLICATION TO AFFECTED AREA(S) TWO TIMES A DAY AS NEEDED - DO NOT USE ON FACE, Disp: 60 g, Rfl: 1   Vitamin D, Ergocalciferol, (DRISDOL) 1.25 MG (50000 UNIT) CAPS capsule, Take 50,000 Units by mouth once a week., Disp: , Rfl:    Medications ordered in this encounter:  No orders of the defined types were placed in this encounter.    *If you need refills on other medications prior to your next appointment, please contact your pharmacy*  Follow-Up: Call back or seek an in-person evaluation if the symptoms worsen or if the condition fails to improve as anticipated.  [facility] [phone]  Other Instructions Please report to the nearest Emergency room with any worsening symptoms. Follow up with primary care provider (PCP) in 2 -3 days.    If you have been instructed to have an in-person  evaluation today at a local Urgent Care facility, please use the link below. It will take you to a list of all of our available [facility], including address, phone number and hours of operation. Please do not delay care.  [facility]  If you or a family member do not have a primary care provider, use the link below to schedule a visit and establish care. When you choose a [facility] primary care physician or advanced practice provider, you gain a long-term partner in health. Find a Primary Care Provider  Learn more about [facility]  in-office and virtual care options: [facility] - Get Care Now

## 2024-02-10 NOTE — Progress Notes (Signed)
 Virtual Visit Consent   Paula Benson, you are scheduled for a virtual visit with a [facility] provider today. Just as with appointments in the office, your consent must be obtained to participate. Your consent will be active for this visit and any virtual visit you may have with one of our providers in the next 365 days. If you have a MyChart account, a copy of this consent can be sent to you electronically.  As this is a virtual visit, video technology does not allow for your provider to perform a traditional examination. This may limit your provider's ability to fully assess your condition. If your provider identifies any concerns that need to be evaluated in person or the need to arrange testing (such as labs, EKG, etc.), we will make arrangements to do so. Although advances in technology are sophisticated, we cannot ensure that it will always work on either your end or our end. If the connection with a video visit is poor, the visit may have to be switched to a telephone visit. With either a video or telephone visit, we are not always able to ensure that we have a secure connection.  By engaging in this virtual visit, you consent to the provision of healthcare and authorize for your insurance to be billed (if applicable) for the services provided during this visit. Depending on your insurance coverage, you may receive a charge related to this service.  I need to obtain your verbal consent now. Are you willing to proceed with your visit today? Paula Benson has provided verbal consent on 02/10/2024 for a virtual visit (video or telephone). Paula Benson, [place]  Date: 02/10/2024 2:55 PM   Virtual Visit via Video Note   I, Paula Benson, connected with  [name]  ([id number], 1986/10/23) on 02/10/24 at  2:45 PM EST by a video-enabled telemedicine application and verified that I am speaking with the correct person using two identifiers.  Location: Patient: Virtual Visit Location Patient:  Home Provider: Virtual Visit Location Provider: Home Office   I discussed the limitations of evaluation and management by telemedicine and the availability of in person appointments. The patient expressed understanding and agreed to proceed.    History of Present Illness: Paula Benson is a 38 y.o. who identifies as a female who was assigned female at birth, and is being seen today for COVID + home  test.  HPI: URI  This is a new problem. The current episode started today. The problem has been unchanged. There has been no fever. Associated symptoms include coughing and rhinorrhea. She has tried nothing for the symptoms. The treatment provided no relief.    Problems:  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HCC) 12/31/2015   CAP (community acquired pneumonia) 11/02/2014   Pyelonephritis 10/31/2014   Hypokalemia 10/31/2014   Neck pain 02/05/2014   Migraine headache 08/14/2013   Palpitations 04/12/2012   History of aspiration pneumonitis 02/20/2012   Iron deficiency anemia 02/20/2012   Recurrent urinary tract infection 08/25/2011   Genital herpes 06/09/2011   Panic disorder 03/21/2011   Chronic anticoagulation 03/17/2011   ASTHMA, INTERMITTENT 03/04/2010    Allergies: No Known Allergies Medications:  Current Outpatient Medications:    acetaminophen (TYLENOL) 500 MG tablet, Take 1,000 mg by mouth every 6 (six) hours as needed for mild pain, moderate pain or headache., Disp: , Rfl:    acyclovir (ZOVIRAX) 200 MG capsule, Take 2 capsules (  400 mg total) by mouth 2 (two) times daily. Use for HSV suppression.  If outbreak take 800 mg TID for 2 days, Disp: 400 capsule, Rfl: 3   albuterol (VENTOLIN HFA) 108 (90 Base) MCG/ACT inhaler, Inhale 2 puffs into the lungs every 6 (six) hours as needed for wheezing or shortness of breath., Disp: 1 each, Rfl: 6   ALPRAZolam (XANAX) 0.25 MG tablet, Take 1 tablet (0.25 mg total)  by mouth 3 (three) times daily as needed for anxiety., Disp: 20 tablet, Rfl: 0   ARIPiprazole (ABILIFY) 15 MG tablet, Take 1 tablet (15 mg total) by mouth daily., Disp: 30 tablet, Rfl: 0   busPIRone (BUSPAR) 10 MG tablet, Take 1 tablet (10 mg total) by mouth 2 (two) times daily., Disp: 60 tablet, Rfl: 0   Fluocinolone Acetonide Scalp 0.01 % OIL, Apply to scalp daily as needed, wash out 8- 12 hours later, Disp: 118 mL, Rfl: 1   lidocaine-prilocaine (EMLA) cream, APPLY TOPICALLY AS NEEDED, Disp: 30 g, Rfl: 0   lurasidone (LATUDA) 40 MG TABS tablet, Take 40 mg by mouth daily., Disp: , Rfl:    nitrofurantoin, macrocrystal-monohydrate, (MACROBID) 100 MG capsule, Take 1 capsule (100 mg total) by mouth 2 (two) times daily., Disp: 14 capsule, Rfl: 0   OZEMPIC, 0.25 OR 0.5 MG/DOSE, 2 MG/3ML SOPN, DIAL AND INJECT UNDER THE SKIN 0.5 MG WEEKLY, Disp: 3 mL, Rfl: 1   Rimegepant Sulfate (NURTEC) 75 MG TBDP, Take every other day for headache prevention, Disp: 15 tablet, Rfl: 2   tiZANidine (ZANAFLEX) 4 MG tablet, Take 1 tablet (4 mg total) by mouth every 8 (eight) hours as needed for muscle spasms., Disp: 30 tablet, Rfl: 6   triamcinolone ointment (KENALOG) 0.5 %, APPLY 1 APPLICATION TO AFFECTED AREA(S) TWO TIMES A DAY AS NEEDED - DO NOT USE ON FACE, Disp: 60 g, Rfl: 1   Vitamin D, Ergocalciferol, (DRISDOL) 1.25 MG (50000 UNIT) CAPS capsule, Take 50,000 Units by mouth once a week., Disp: , Rfl:   Observations/Objective: Patient is well-developed, well-nourished in no acute distress.  Resting comfortably  at home.  Head is normocephalic, atraumatic.  No labored breathing.  Speech is clear and coherent with logical content.  Patient is alert and oriented at baseline.    Assessment and Plan: 1. COVID-19 (Primary)  Patient presents with symptoms suspicious for likely COVID 19 in the setting of a positive home test.  Differentials include bacterial pneumonia, sinusitis, allergic rhinitis. Do not suspect  underlying cardiopulmonary process. I considered, but think unlikely, dangerous causes of this patient's symptoms to include ACS, CHF or COPD exacerbations, pneumonia, pneumothorax. Patient is nontoxic appearing and not in need of emergent medical intervention.  Plan: reassurance, reassessment, over the counter medications, discharge with PCP follow-up.  Follow Up Instructions: I discussed the assessment and treatment plan with the patient. The patient was provided an opportunity to ask questions and all were answered. The patient agreed with the plan and demonstrated an understanding of the instructions.  A copy of instructions were sent to the patient via MyChart unless otherwise noted below.     The patient was advised to call back or seek an in-person evaluation if the symptoms worsen or if the condition fails to improve as anticipated.    Paula Kidney, PA-C

## 2024-02-11 MED ORDER — ONDANSETRON HCL 4 MG PO TABS: 4.0000 mg | ORAL_TABLET | Freq: Three times a day (TID) | ORAL | 0 refills | Status: AC | PRN

## 2024-02-11 NOTE — Addendum Note (Signed)
 Addended by: Pearline Cables on: 02/11/2024 08:38 PM   Modules accepted: Orders

## 2024-02-11 NOTE — Telephone Encounter (Signed)
 Please see the MyChart message reply(ies) for my assessment and plan.  The patient gave consent for this Medical Advice Message and is aware that it may result in a bill to their insurance company as well as the possibility that this may result in a co-payment or deductible. They are an established patient, but are not seeking medical advice exclusively about a problem treated during an in person or video visit in the last 7 days. I did not recommend an in person or video visit within 7 days of my reply.  I spent a total of 15 minutes cumulative time within 7 days through [employer] Abbe Amsterdam, MD

## 2024-02-12 ENCOUNTER — Telehealth: Payer: Self-pay | Admitting: Pharmacy Technician

## 2024-02-12 ENCOUNTER — Other Ambulatory Visit: Payer: Self-pay | Admitting: Hematology and Oncology

## 2024-02-12 MED ORDER — DOXYCYCLINE HYCLATE 100 MG PO CAPS: 100.0000 mg | ORAL_CAPSULE | Freq: Two times a day (BID) | ORAL | 0 refills | Status: AC

## 2024-02-12 NOTE — Telephone Encounter (Signed)
 Called her back- she notes she is not feeling well- she has chest congestion and cough  She had flu a month ago- then about 4 days ago she started feeling ill again and tested + for covid at home  She is worried she is going to get pneumonia which has occurred a few times in the past  She is supposed to get an iron infusion tomorrow  She expresses frustration that she has recurrent iron deficiency, we have not been able to really figure out why this is happening yet.  She also notes history of blood clots on multiple occasions.  She would be interested in getting a second opinion for hematology to see if there is anything else she could be doing.  I will set this up for her

## 2024-02-12 NOTE — Addendum Note (Signed)
 Addended by: Ihor Austin D on: 02/12/2024 09:56 AM   Modules accepted: Orders

## 2024-02-12 NOTE — Telephone Encounter (Signed)
 Auth Submission: NO AUTH NEEDED Site of care: Site of care: [facility] Payer: [employer] Medication & CPT/J Code(s) submitted: Venofer (Iron Sucrose) J1756 Route of submission (phone, fax, portal):  Phone # Fax # Auth type: Buy/Bill PB Units/visits requested: 3 DOSES Reference number:  Approval from: 02/12/24 to 07/14/24

## 2024-02-12 NOTE — Addendum Note (Signed)
 Addended by: Pearline Cables on: 02/12/2024 06:42 PM   Modules accepted: Orders

## 2024-02-13 ENCOUNTER — Ambulatory Visit

## 2024-02-13 MED ORDER — IRON SUCROSE 20 MG/ML IV SOLN
300.0000 mg | Freq: Once | INTRAVENOUS | Status: DC
  Filled 2024-02-13: qty 15

## 2024-02-13 MED ORDER — IRON SUCROSE 300 MG IVPB - SIMPLE MED: 300.0000 mg | Status: DC

## 2024-02-14 ENCOUNTER — Encounter: Payer: Self-pay | Admitting: Hematology and Oncology

## 2024-02-15 ENCOUNTER — Other Ambulatory Visit: Payer: No Typology Code available for payment source

## 2024-02-16 ENCOUNTER — Other Ambulatory Visit: Payer: No Typology Code available for payment source

## 2024-02-19 ENCOUNTER — Telehealth: Payer: No Typology Code available for payment source | Admitting: Hematology and Oncology

## 2024-02-20 ENCOUNTER — Ambulatory Visit

## 2024-02-20 VITALS — BP 126/85 | HR 83 | Temp 98.7°F | Resp 16 | Ht 63.0 in | Wt 187.0 lb

## 2024-02-20 DIAGNOSIS — E538 Deficiency of other specified B group vitamins: Secondary | ICD-10-CM | POA: Diagnosis not present

## 2024-02-20 DIAGNOSIS — D509 Iron deficiency anemia, unspecified: Secondary | ICD-10-CM | POA: Diagnosis not present

## 2024-02-20 MED ORDER — IRON SUCROSE 20 MG/ML IV SOLN
300.0000 mg | Freq: Once | INTRAVENOUS | Status: AC
  Administered 2024-02-20: 300 mg via INTRAVENOUS
  Filled 2024-02-20: qty 15

## 2024-02-20 MED ORDER — CYANOCOBALAMIN 1000 MCG/ML IJ SOLN
1000.0000 ug | Freq: Once | INTRAMUSCULAR | Status: AC
  Administered 2024-02-20: 1000 ug via INTRAMUSCULAR
  Filled 2024-02-20: qty 1

## 2024-02-20 MED ORDER — HEPARIN SOD (PORK) LOCK FLUSH 100 UNIT/ML IV SOLN
500.0000 [IU] | Freq: Once | INTRAVENOUS | Status: AC | PRN
  Administered 2024-02-20: 500 [IU]
  Filled 2024-02-20: qty 5

## 2024-02-20 MED ORDER — IRON SUCROSE 300 MG IVPB - SIMPLE MED: 300.0000 mg | Status: DC

## 2024-02-20 NOTE — Progress Notes (Signed)
 Diagnosis: Iron Deficiency Anemia  Provider:  Chilton Greathouse MD  Procedure: IV Infusion  IV Type: Port a Cath, IV Location: R Chest  Venofer (Iron Sucrose), Dose: 300 mg  Infusion Start Time: 0847  Infusion Stop Time: 1029  Post Infusion IV Care: Patient declined observation and Port a Cath Deaccessed/Flushed and heparin locked  Discharge: Condition: Stable, Destination: Home . AVS Declined  Performed by:  Wyvonne Lenz, RN

## 2024-02-27 ENCOUNTER — Encounter: Payer: Self-pay | Admitting: Hematology and Oncology

## 2024-02-27 ENCOUNTER — Ambulatory Visit (INDEPENDENT_AMBULATORY_CARE_PROVIDER_SITE_OTHER)

## 2024-02-27 VITALS — BP 123/82 | HR 84 | Temp 98.9°F | Resp 18 | Ht 63.0 in | Wt 184.4 lb

## 2024-02-27 DIAGNOSIS — D509 Iron deficiency anemia, unspecified: Secondary | ICD-10-CM | POA: Diagnosis not present

## 2024-02-27 MED ORDER — SODIUM CHLORIDE 0.9 % IV SOLN
300.0000 mg | INTRAVENOUS | Status: DC
  Administered 2024-02-27: 300 mg via INTRAVENOUS
  Filled 2024-02-27: qty 15

## 2024-02-27 MED ORDER — HEPARIN SOD (PORK) LOCK FLUSH 100 UNIT/ML IV SOLN
500.0000 [IU] | Freq: Once | INTRAVENOUS | Status: AC | PRN
  Administered 2024-02-27: 500 [IU]
  Filled 2024-02-27: qty 5

## 2024-02-27 MED ORDER — DIPHENHYDRAMINE HCL 25 MG PO CAPS
25.0000 mg | ORAL_CAPSULE | Freq: Once | ORAL | Status: DC
Start: 2024-02-27 — End: 2024-02-27

## 2024-02-27 NOTE — Progress Notes (Signed)
 Diagnosis: Iron Deficiency Anemia  Provider:  Chilton Greathouse MD  Procedure: IV Infusion  IV Type: Port a Cath, IV Location: R Chest  Venofer (Iron Sucrose), Dose: 300 mg  Infusion Start Time: 0842  Infusion Stop Time: 1027  Post Infusion IV Care: Patient declined observation and Peripheral IV Discontinued  Discharge: Condition: Good, Destination: Home . AVS Declined  Performed by:  Adriana Mccallum, RN    Patient refused pre-medications. Nurse educated patient and stressed the importance of taking pre-medications as a precaution in the event of a medication reaction. Patient verbalized understanding.

## 2024-02-28 ENCOUNTER — Encounter: Payer: Self-pay | Admitting: Hematology and Oncology

## 2024-02-28 ENCOUNTER — Encounter: Payer: Self-pay | Admitting: Gastroenterology

## 2024-02-28 ENCOUNTER — Telehealth (INDEPENDENT_AMBULATORY_CARE_PROVIDER_SITE_OTHER): Admitting: Family Medicine

## 2024-02-28 DIAGNOSIS — D509 Iron deficiency anemia, unspecified: Secondary | ICD-10-CM

## 2024-02-28 NOTE — Progress Notes (Signed)
 [facility] [address] [place], [place] [address] [phone]  Date:  02/28/2024   Name:  Paula Benson   DOB:  02-Feb-1986   MRN:  [id number]  PCP:  Pearline Cables, MD    Chief Complaint: Fatigue   History of Present Illness:  Paula Benson is a 38 y.o. very pleasant female patient who presents with the following:  Connected with pt via mychart video today She is at home, I am at office Pt ID confirmed with 2 factors, she gives consent for a virtual visit today. Pt and myself are present on the call today  Pt notes she has felt very tired the last 2 days She got an iron infusion yesterday-she notes that she tends to feel very fatigued after her iron infusions.  She has another infusion planned in about a week  She plans to take a LOA from her job at [employer]; she is working overnight which is making her fatigue worse  She needs documentation from me regarding this LOA  She would like to start her LOA on the date of her next infusion, April 1  She is getting another opinion from hemology via [facility] in April due to persistent iron deficiency which has been requiring frequent infusions over the years  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HCC) 12/31/2015   CAP (community acquired pneumonia) 11/02/2014   Pyelonephritis 10/31/2014   Hypokalemia 10/31/2014   Neck pain 02/05/2014   Migraine headache 08/14/2013   Palpitations 04/12/2012   History of aspiration pneumonitis 02/20/2012   Iron deficiency anemia 02/20/2012   Recurrent urinary tract infection 08/25/2011   Genital herpes 06/09/2011   Panic disorder 03/21/2011   Chronic anticoagulation 03/17/2011   ASTHMA, INTERMITTENT 03/04/2010    Past Medical History:  Diagnosis Date   Anemia    Anxiety    Asthma    Bipolar II disorder (HCC)    Complication of anesthesia 2003    ASPIRATED DURING EMERGENCY CS   Depression    doing ok   Eclamptic seizure 2003   Fibroid    Genital herpes    Headache(784.0)    Herpes    History of blood clots    x3   History of blood transfusion MARCH 2013   Menometrorrhagia 07/27/2016   Migraines    Personal history of PE (pulmonary embolism) x 3, latest 02/2010   noncompliant with INR checks, must limit refills   Preeclampsia 2003   with first pregnancy   Pregnancy induced hypertension 2003   seized 2wks after   Pyelonephritis 2012   Seizures (HCC) 2003   after first delivery   Suicidal ideations    Urinary tract infection     Past Surgical History:  Procedure Laterality Date   CESAREAN SECTION  12/2001 and 01/2008   CESAREAN SECTION  06/26/2012   Procedure: CESAREAN SECTION;  Surgeon: Willodean Rosenthal, MD;  Location: [facility];  Service: Obstetrics;  Laterality: N/A;   INDUCED ABORTION  01/2010   IR IMAGING GUIDED PORT INSERTION  11/17/2023    Social History   Tobacco Use   Smoking status: Never   Smokeless tobacco: Never  Vaping Use   Vaping status: Never Used  Substance Use Topics   Alcohol use: No   Drug use: No    Family History  Problem Relation Age of Onset   Diabetes  Mother        Type 2   Anemia Sister    Hypertension Sister    Breast cancer Maternal Aunt    Anesthesia problems Neg Hx     No Known Allergies  Medication list has been reviewed and updated.  Current Outpatient Medications on File Prior to Visit  Medication Sig Dispense Refill   acetaminophen (TYLENOL) 500 MG tablet Take 1,000 mg by mouth every 6 (six) hours as needed for mild pain, moderate pain or headache.     acyclovir (ZOVIRAX) 200 MG capsule Take 2 capsules (400 mg total) by mouth 2 (two) times daily. Use for HSV suppression.  If outbreak take 800 mg TID for 2 days 400 capsule 3   albuterol (VENTOLIN HFA) 108 (90 Base) MCG/ACT inhaler Inhale 2 puffs into the lungs every 6 (six) hours as needed for wheezing or shortness  of breath. 1 each 6   ALPRAZolam (XANAX) 0.25 MG tablet Take 1 tablet (0.25 mg total) by mouth 3 (three) times daily as needed for anxiety. 20 tablet 0   ARIPiprazole (ABILIFY) 15 MG tablet Take 1 tablet (15 mg total) by mouth daily. 30 tablet 0   busPIRone (BUSPAR) 10 MG tablet Take 1 tablet (10 mg total) by mouth 2 (two) times daily. 60 tablet 0   cetirizine (ZYRTEC ALLERGY) 10 MG tablet Take 1 tablet (10 mg total) by mouth daily for 14 days. 14 tablet 0   doxycycline (VIBRAMYCIN) 100 MG capsule Take 1 capsule (100 mg total) by mouth 2 (two) times daily. 20 capsule 0   Fluocinolone Acetonide Scalp 0.01 % OIL Apply to scalp daily as needed, wash out 8- 12 hours later 118 mL 1   fluticasone (FLONASE) 50 MCG/ACT nasal spray Place 2 sprays into both nostrils daily. 16 g 6   lidocaine-prilocaine (EMLA) cream APPLY TOPICALLY AS NEEDED 30 g 0   lurasidone (LATUDA) 40 MG TABS tablet Take 40 mg by mouth daily.     nitrofurantoin, macrocrystal-monohydrate, (MACROBID) 100 MG capsule Take 1 capsule (100 mg total) by mouth 2 (two) times daily. 14 capsule 0   ondansetron (ZOFRAN) 4 MG tablet Take 1 tablet (4 mg total) by mouth every 8 (eight) hours as needed for nausea or vomiting. 20 tablet 0   OZEMPIC, 0.25 OR 0.5 MG/DOSE, 2 MG/3ML SOPN DIAL AND INJECT UNDER THE SKIN 0.5 MG WEEKLY 3 mL 1   Rimegepant Sulfate (NURTEC) 75 MG TBDP Take every other day for headache prevention 15 tablet 2   tiZANidine (ZANAFLEX) 4 MG tablet Take 1 tablet (4 mg total) by mouth every 8 (eight) hours as needed for muscle spasms. 30 tablet 6   triamcinolone ointment (KENALOG) 0.5 % APPLY 1 APPLICATION TO AFFECTED AREA(S) TWO TIMES A DAY AS NEEDED - DO NOT USE ON FACE 60 g 1   Vitamin D, Ergocalciferol, (DRISDOL) 1.25 MG (50000 UNIT) CAPS capsule Take 50,000 Units by mouth once a week.     No current facility-administered medications on file prior to visit.    Review of Systems:  As per HPI- otherwise negative.   Physical  Examination: There were no vitals filed for this visit. There were no vitals filed for this visit. There is no height or weight on file to calculate BMI. Ideal Body Weight:    Patient is her via MyChart video.  She looks well, her normal self  Assessment and Plan: Iron deficiency anemia, unspecified iron deficiency anemia type  Patient seen virtually via MyChart video today  to discuss iron deficiency anemia.  She is getting periodic iron infusions.  Shahed is currently working 1 job during the day and also does a job at night for [employer].  She plans to take a leave of absence from her [employer] job due to extreme fatigue associated with iron deficiency and associated infusion treatment.  I provided documentation regarding her planned leave of absence today  Signed Abbe Amsterdam, MD

## 2024-03-05 ENCOUNTER — Ambulatory Visit

## 2024-03-05 VITALS — BP 116/69 | HR 87 | Temp 98.9°F | Resp 16 | Ht 63.0 in | Wt 186.4 lb

## 2024-03-05 DIAGNOSIS — D509 Iron deficiency anemia, unspecified: Secondary | ICD-10-CM

## 2024-03-05 DIAGNOSIS — E538 Deficiency of other specified B group vitamins: Secondary | ICD-10-CM

## 2024-03-05 MED ORDER — IRON SUCROSE 300 MG IVPB - SIMPLE MED
300.0000 mg | Freq: Once | Status: DC
Start: 2024-03-05 — End: 2024-03-05

## 2024-03-05 MED ORDER — HEPARIN SOD (PORK) LOCK FLUSH 100 UNIT/ML IV SOLN
500.0000 [IU] | Freq: Once | INTRAVENOUS | Status: AC | PRN
  Administered 2024-03-05: 500 [IU]
  Filled 2024-03-05: qty 5

## 2024-03-05 MED ORDER — SODIUM CHLORIDE 0.9 % IV SOLN
300.0000 mg | Freq: Once | INTRAVENOUS | Status: AC
  Administered 2024-03-05: 300 mg via INTRAVENOUS
  Filled 2024-03-05: qty 15

## 2024-03-05 MED ORDER — DIPHENHYDRAMINE HCL 25 MG PO CAPS
25.0000 mg | ORAL_CAPSULE | Freq: Once | ORAL | Status: AC
  Administered 2024-03-05: 25 mg via ORAL
  Filled 2024-03-05: qty 1

## 2024-03-05 MED ORDER — CYANOCOBALAMIN 1000 MCG/ML IJ SOLN
1000.0000 ug | Freq: Once | INTRAMUSCULAR | Status: AC
  Administered 2024-03-05: 1000 ug via INTRAMUSCULAR
  Filled 2024-03-05: qty 1

## 2024-03-05 NOTE — Progress Notes (Signed)
 Diagnosis: Iron Deficiency Anemia  Provider:  Chilton Greathouse MD  Procedure: IV Infusion  IV Type: Port a Cath, IV Location: R Chest  Venofer (Iron Sucrose), Dose: 300 mg  Infusion Start Time: 0857  Infusion Stop Time: 1045    Procedure: Injection  Cyanocobalamin, Dose: , Site: intramuscular, Number of injections: 1  Injection Site(s): Right arm    Post Infusion IV Care: Patient declined observation and Port a Cath Deaccessed/Flushed  Discharge: Condition: Good, Destination: Home . AVS Declined  Performed by:  Adriana Mccallum, RN

## 2024-03-28 ENCOUNTER — Inpatient Hospital Stay: Payer: No Typology Code available for payment source | Attending: Hematology and Oncology

## 2024-03-28 DIAGNOSIS — Z452 Encounter for adjustment and management of vascular access device: Secondary | ICD-10-CM | POA: Insufficient documentation

## 2024-03-28 DIAGNOSIS — D509 Iron deficiency anemia, unspecified: Secondary | ICD-10-CM | POA: Diagnosis present

## 2024-03-28 DIAGNOSIS — Z95828 Presence of other vascular implants and grafts: Secondary | ICD-10-CM | POA: Insufficient documentation

## 2024-03-28 MED ORDER — SODIUM CHLORIDE 0.9% FLUSH
10.0000 mL | Freq: Once | INTRAVENOUS | Status: AC
  Administered 2024-03-28: 10 mL

## 2024-03-28 MED ORDER — HEPARIN SOD (PORK) LOCK FLUSH 100 UNIT/ML IV SOLN
250.0000 [IU] | Freq: Once | INTRAVENOUS | Status: AC
  Administered 2024-03-28: 250 [IU]

## 2024-04-01 ENCOUNTER — Encounter: Payer: Self-pay | Admitting: Family Medicine

## 2024-04-02 ENCOUNTER — Encounter: Payer: Self-pay | Admitting: Hematology and Oncology

## 2024-04-02 ENCOUNTER — Telehealth: Payer: Self-pay | Admitting: *Deleted

## 2024-04-02 NOTE — Telephone Encounter (Signed)
 Pt called with concerns about PE forming after termination of pregnancy and would like to speak with provider about restarting blood thinners. Pt has been scheduled for lab and appt with Dr. Gudena to discuss blood thinners. Pt is aware of appt date and time.

## 2024-04-04 ENCOUNTER — Encounter: Payer: Self-pay | Admitting: Family Medicine

## 2024-04-05 ENCOUNTER — Encounter: Payer: Self-pay | Admitting: Family Medicine

## 2024-04-09 NOTE — Telephone Encounter (Signed)
 Forms have been re-scanned to pts email and I have received a confirmation. I hope you have a great day!

## 2024-04-10 NOTE — Telephone Encounter (Signed)
 Forms have been scanned to pts email.

## 2024-04-16 ENCOUNTER — Inpatient Hospital Stay

## 2024-04-16 ENCOUNTER — Inpatient Hospital Stay: Admitting: Hematology and Oncology

## 2024-04-16 ENCOUNTER — Inpatient Hospital Stay: Attending: Hematology and Oncology

## 2024-04-16 ENCOUNTER — Other Ambulatory Visit: Payer: Self-pay | Admitting: *Deleted

## 2024-04-16 DIAGNOSIS — M79606 Pain in leg, unspecified: Secondary | ICD-10-CM

## 2024-04-16 DIAGNOSIS — D509 Iron deficiency anemia, unspecified: Secondary | ICD-10-CM | POA: Insufficient documentation

## 2024-04-16 DIAGNOSIS — Z95828 Presence of other vascular implants and grafts: Secondary | ICD-10-CM

## 2024-04-16 LAB — CBC WITH DIFFERENTIAL (CANCER CENTER ONLY)
Abs Immature Granulocytes: 0.03 10*3/uL (ref 0.00–0.07)
Basophils Absolute: 0.1 10*3/uL (ref 0.0–0.1)
Basophils Relative: 1 %
Eosinophils Absolute: 0.1 10*3/uL (ref 0.0–0.5)
Eosinophils Relative: 2 %
HCT: 34.9 % — ABNORMAL LOW (ref 36.0–46.0)
Hemoglobin: 11.8 g/dL — ABNORMAL LOW (ref 12.0–15.0)
Immature Granulocytes: 0 %
Lymphocytes Relative: 27 %
Lymphs Abs: 2.2 10*3/uL (ref 0.7–4.0)
MCH: 30 pg (ref 26.0–34.0)
MCHC: 33.8 g/dL (ref 30.0–36.0)
MCV: 88.8 fL (ref 80.0–100.0)
Monocytes Absolute: 0.4 10*3/uL (ref 0.1–1.0)
Monocytes Relative: 5 %
Neutro Abs: 5.3 10*3/uL (ref 1.7–7.7)
Neutrophils Relative %: 65 %
Platelet Count: 337 10*3/uL (ref 150–400)
RBC: 3.93 MIL/uL (ref 3.87–5.11)
RDW: 15 % (ref 11.5–15.5)
WBC Count: 8.1 10*3/uL (ref 4.0–10.5)
nRBC: 0 % (ref 0.0–0.2)

## 2024-04-16 LAB — IRON AND IRON BINDING CAPACITY (CC-WL,HP ONLY)
Iron: 94 ug/dL (ref 28–170)
Saturation Ratios: 25 % (ref 10.4–31.8)
TIBC: 378 ug/dL (ref 250–450)
UIBC: 284 ug/dL (ref 148–442)

## 2024-04-16 LAB — VITAMIN B12: Vitamin B-12: 240 pg/mL (ref 180–914)

## 2024-04-16 LAB — D-DIMER, QUANTITATIVE: D-Dimer, Quant: <0.27 ug{FEU}/mL (ref 0.00–0.50)

## 2024-04-16 MED ORDER — HEPARIN SOD (PORK) LOCK FLUSH 100 UNIT/ML IV SOLN
500.0000 [IU] | Freq: Once | INTRAVENOUS | Status: DC
Start: 2024-04-16 — End: 2024-04-16

## 2024-04-16 MED ORDER — SODIUM CHLORIDE 0.9% FLUSH
10.0000 mL | Freq: Once | INTRAVENOUS | Status: DC
Start: 2024-04-16 — End: 2024-04-16

## 2024-04-16 NOTE — Assessment & Plan Note (Deleted)
(  She had a prior history of multiple PEs and had seen Dr. Maria Shiner previously)   IV iron : March 2023, October 2023, 07/07/2023, December 2024, March 2025   Labs  02/06/2022: Ferritin 2.6, TIBC 669, there was no hemoglobin available to review. 02/17/2022: Hemoglobin 7.9, MCV 66, RDW 21.2, platelets 593 06/28/23: Hemoglobin: 10.6, Ferritin: 3.6 10/19/2023: Hemoglobin 11.1, MCV 85.8, B12 159, iron  saturation 3%, ferritin 6 02/02/2024: Hemoglobin 11.5, MCV 87.8, iron  saturation 8%, ferritin 9, B12 189 (ice chip cravings)   Cause: unclear. No gyn bleeding   Difficulty performing tasks with pain in the hands, unexplained bruising: Workup including platelet function assay: Normal, von Willebrand factor : Normal, factor VIII: 98% von Willebrand factor  antigen: 86%   Treatment plan: Intermittent IV iron  infusions B12 injections   Return to clinic with 3 months with labs and telephone visit after that to discuss results

## 2024-04-16 NOTE — Progress Notes (Signed)
 Pt requesting labs to be ordered to check for blood clots. Spoke with Verdis Glade, RN about this. Ordered d-dimer which cannot be drawn from port. Lab appt made, pt sent up to main lobby. Advised to go to the ED if she feels like she needs to be evaluated prior to phone appt tomorrow morning.

## 2024-04-17 ENCOUNTER — Inpatient Hospital Stay (HOSPITAL_BASED_OUTPATIENT_CLINIC_OR_DEPARTMENT_OTHER): Admitting: Hematology and Oncology

## 2024-04-17 DIAGNOSIS — E538 Deficiency of other specified B group vitamins: Secondary | ICD-10-CM | POA: Diagnosis not present

## 2024-04-17 DIAGNOSIS — Z86718 Personal history of other venous thrombosis and embolism: Secondary | ICD-10-CM

## 2024-04-17 DIAGNOSIS — D509 Iron deficiency anemia, unspecified: Secondary | ICD-10-CM | POA: Diagnosis not present

## 2024-04-17 LAB — FERRITIN: Ferritin: 56 ng/mL (ref 11–307)

## 2024-04-17 NOTE — Progress Notes (Signed)
 HEMATOLOGY-ONCOLOGY TELEPHONE VISIT PROGRESS NOTE  I connected with our patient on 04/17/24 at  8:00 AM EDT by telephone and verified that I am speaking with the correct person using two identifiers.  I discussed the limitations, risks, security and privacy concerns of performing an evaluation and management service by telephone and the availability of in person appointments.  I also discussed with the patient that there may be a patient responsible charge related to this service. The patient expressed understanding and agreed to proceed.   History of Present Illness:    History of Present Illness Paula Benson is a 38 year old female with iron  deficiency anemia who presents with concerns about post-termination bleeding and leg pain.  She experienced heavy bleeding starting on May 9th after taking misoprostol, which continues to the present day. She is concerned about her iron  levels due to the ongoing bleeding. Her hemoglobin has improved to 11.8 g/dL, and iron  saturation has increased from 8% to 25%. Her last iron  infusion was on April 1st.  She has a history of pulmonary embolism following previous terminations, with the last occurrence in 2011. She is concerned about the possibility of developing another blood clot. She experienced leg pain yesterday, described as feeling like a pulled muscle, without any physical activity to cause such an injury.    Oncology History   No history exists.    REVIEW OF SYSTEMS:   Constitutional: Denies fevers, chills or abnormal weight loss All other systems were reviewed with the patient and are negative. Observations/Objective:     Assessment Plan:  Iron  deficiency anemia (She had a prior history of multiple PEs and had seen Dr. Maria Shiner previously)   IV iron : March 2023, October 2023, 07/07/2023, December 2024, March 2025,   Labs  02/06/2022: Ferritin 2.6, TIBC 669, there was no hemoglobin available to review. 02/17/2022: Hemoglobin 7.9, MCV 66,  RDW 21.2, platelets 593 06/28/23: Hemoglobin: 10.6, Ferritin: 3.6 10/19/2023: Hemoglobin 11.1, MCV 85.8, B12 159, iron  saturation 3%, ferritin 6 02/02/2024: Hemoglobin 11.5, MCV 87.8, iron  saturation 8%, ferritin 9, B12 189 (ice chip cravings) 04/16/24: Hemoglobin 11.8, MCV 88.8, iron  saturation 25%, B12 240, ferritin pending   Cause: unclear. Seeing GI 04/25/24   Pregnancy termination 04/11/24: continuing to have bleeding   unexplained bruising: Workup including platelet function assay: Normal, von Willebrand factor : Normal, factor VIII: 98% von Willebrand factor  antigen: 86%   Treatment plan: No need of IV iron  at this time.  Leg pains: Eval for blood clots with vascular ultrasound (patient previously had developed blood clots around pregnancies)   Because of ongoing issues with continued bleeding, we would like to recheck her labs in 1 month and telephone visit, 2 days later to discuss results     Assessment & Plan Iron  deficiency anemia Improved hemoglobin and iron  saturation. Recent termination of pregnancy and ongoing bleeding may contribute to decreased iron  levels. - Recheck hemoglobin and iron  levels in one month. - Monitor bleeding and assess need for further iron  supplementation.  Post-termination bleeding Ongoing heavy bleeding since termination of pregnancy with misoprostol use. Bleeding expected to decrease over time. - Monitor bleeding and assess need for intervention if bleeding persists.  Leg pain, possible blood clot Intermittent leg pain following termination of pregnancy, raising concern for possible blood clot. - Order leg ultrasound to check for blood clots.  Vitamin B12 deficiency Vitamin B12 levels improved, indicating positive response to treatment.  Follow-up Plans discussed to monitor current conditions and address any arising issues. - Schedule leg  ultrasound and notify her of date and time. - Recheck labs in one month and notify her of date and time. -  Follow up with gastroenterologist on Apr 25, 2024.      I discussed the assessment and treatment plan with the patient. The patient was provided an opportunity to ask questions and all were answered. The patient agreed with the plan and demonstrated an understanding of the instructions. The patient was advised to call back or seek an in-person evaluation if the symptoms worsen or if the condition fails to improve as anticipated.   I provided 20 minutes of non-face-to-face time during this encounter.  This includes time for charting and coordination of care   Margert Sheerer, MD

## 2024-04-17 NOTE — Assessment & Plan Note (Signed)
(  She had a prior history of multiple PEs and had seen Dr. Maria Shiner previously)   IV iron : March 2023, October 2023, 07/07/2023, December 2024   Labs  02/06/2022: Ferritin 2.6, TIBC 669, there was no hemoglobin available to review. 02/17/2022: Hemoglobin 7.9, MCV 66, RDW 21.2, platelets 593 06/28/23: Hemoglobin: 10.6, Ferritin: 3.6 10/19/2023: Hemoglobin 11.1, MCV 85.8, B12 159, iron  saturation 3%, ferritin 6 02/02/2024: Hemoglobin 11.5, MCV 87.8, iron  saturation 8%, ferritin 9, B12 189 (ice chip cravings) 04/16/24: Hemoglobin 11.8, MCV 88.8, iron  saturation 25%, B12 240, ferritin pending   Cause: unclear. No gyn bleeding   unexplained bruising: Workup including platelet function assay: Normal, von Willebrand factor : Normal, factor VIII: 98% von Willebrand factor  antigen: 86%   Treatment plan: No need of IV iron  at this time.   Return to clinic with 3 months with labs and telephone visit after that to discuss results

## 2024-04-18 ENCOUNTER — Ambulatory Visit (HOSPITAL_COMMUNITY)
Admission: RE | Admit: 2024-04-18 | Discharge: 2024-04-18 | Disposition: A | Discharge status: Home / Self Care | Source: Ambulatory Visit | Attending: Hematology and Oncology | Admitting: Hematology and Oncology

## 2024-04-18 ENCOUNTER — Telehealth: Payer: Self-pay | Admitting: Hematology and Oncology

## 2024-04-18 DIAGNOSIS — D509 Iron deficiency anemia, unspecified: Secondary | ICD-10-CM | POA: Diagnosis present

## 2024-04-18 NOTE — Telephone Encounter (Signed)
 Confirmed with pt schedued appt date and time

## 2024-04-19 ENCOUNTER — Encounter: Payer: Self-pay | Admitting: Family Medicine

## 2024-04-24 NOTE — Progress Notes (Deleted)
 Paula Benson

## 2024-04-25 ENCOUNTER — Ambulatory Visit: Admitting: Gastroenterology

## 2024-05-06 ENCOUNTER — Inpatient Hospital Stay

## 2024-05-07 ENCOUNTER — Telehealth: Payer: Self-pay | Admitting: *Deleted

## 2024-05-07 NOTE — Assessment & Plan Note (Deleted)
(  She had a prior history of multiple PEs and had seen Dr. Maria Shiner previously)   IV iron : March 2023, October 2023, 07/07/2023, December 2024, March 2025,   Labs  02/06/2022: Ferritin 2.6, TIBC 669, there was no hemoglobin available to review. 02/17/2022: Hemoglobin 7.9, MCV 66, RDW 21.2, platelets 593 06/28/23: Hemoglobin: 10.6, Ferritin: 3.6 10/19/2023: Hemoglobin 11.1, MCV 85.8, B12 159, iron  saturation 3%, ferritin 6 02/02/2024: Hemoglobin 11.5, MCV 87.8, iron  saturation 8%, ferritin 9, B12 189 (ice chip cravings) 04/16/24: Hemoglobin 11.8, MCV 88.8, iron  saturation 25%, B12 240, ferritin pending   Cause: unclear. Seeing GI 04/25/24   Pregnancy termination 04/11/24: continuing to have bleeding   unexplained bruising: Workup including platelet function assay: Normal, von Willebrand factor : Normal, factor VIII: 98% von Willebrand factor  antigen: 86%   Treatment plan: No need of IV iron  at this time.

## 2024-05-07 NOTE — Telephone Encounter (Signed)
 Per MD request, appt for tomorrow canceled due to lab work not being completed the day prior.  Pt is already scheduled for labs and telephone visit 2 days later, next week.  RN attempt x1 to contact pt, no answer, LVM regarding appt details.

## 2024-05-08 ENCOUNTER — Telehealth: Admitting: Hematology and Oncology

## 2024-05-17 ENCOUNTER — Other Ambulatory Visit

## 2024-05-17 ENCOUNTER — Inpatient Hospital Stay: Attending: Hematology and Oncology

## 2024-05-17 DIAGNOSIS — E538 Deficiency of other specified B group vitamins: Secondary | ICD-10-CM

## 2024-05-17 DIAGNOSIS — D509 Iron deficiency anemia, unspecified: Secondary | ICD-10-CM | POA: Diagnosis present

## 2024-05-17 DIAGNOSIS — Z79899 Other long term (current) drug therapy: Secondary | ICD-10-CM | POA: Insufficient documentation

## 2024-05-17 DIAGNOSIS — Z95828 Presence of other vascular implants and grafts: Secondary | ICD-10-CM

## 2024-05-17 LAB — CBC WITH DIFFERENTIAL (CANCER CENTER ONLY)
Abs Immature Granulocytes: 0.01 10*3/uL (ref 0.00–0.07)
Basophils Absolute: 0.1 10*3/uL (ref 0.0–0.1)
Basophils Relative: 2 %
Eosinophils Absolute: 0.1 10*3/uL (ref 0.0–0.5)
Eosinophils Relative: 3 %
HCT: 36.9 % (ref 36.0–46.0)
Hemoglobin: 12.1 g/dL (ref 12.0–15.0)
Immature Granulocytes: 0 %
Lymphocytes Relative: 41 %
Lymphs Abs: 1.9 10*3/uL (ref 0.7–4.0)
MCH: 29.7 pg (ref 26.0–34.0)
MCHC: 32.8 g/dL (ref 30.0–36.0)
MCV: 90.7 fL (ref 80.0–100.0)
Monocytes Absolute: 0.3 10*3/uL (ref 0.1–1.0)
Monocytes Relative: 6 %
Neutro Abs: 2.4 10*3/uL (ref 1.7–7.7)
Neutrophils Relative %: 48 %
Platelet Count: 350 10*3/uL (ref 150–400)
RBC: 4.07 MIL/uL (ref 3.87–5.11)
RDW: 14.3 % (ref 11.5–15.5)
WBC Count: 4.8 10*3/uL (ref 4.0–10.5)
nRBC: 0 % (ref 0.0–0.2)

## 2024-05-17 LAB — IRON AND IRON BINDING CAPACITY (CC-WL,HP ONLY)
Iron: 46 ug/dL (ref 28–170)
Saturation Ratios: 12 % (ref 10.4–31.8)
TIBC: 398 ug/dL (ref 250–450)
UIBC: 352 ug/dL (ref 148–442)

## 2024-05-17 LAB — VITAMIN B12: Vitamin B-12: 295 pg/mL (ref 180–914)

## 2024-05-17 LAB — FERRITIN: Ferritin: 24 ng/mL (ref 11–307)

## 2024-05-17 MED ORDER — SODIUM CHLORIDE 0.9% FLUSH
10.0000 mL | Freq: Once | INTRAVENOUS | Status: AC
  Administered 2024-05-17: 10 mL

## 2024-05-17 MED ORDER — HEPARIN SOD (PORK) LOCK FLUSH 100 UNIT/ML IV SOLN
250.0000 [IU] | Freq: Once | INTRAVENOUS | Status: AC
  Administered 2024-05-17: 250 [IU]

## 2024-05-18 ENCOUNTER — Other Ambulatory Visit: Payer: Self-pay | Admitting: Family Medicine

## 2024-05-18 ENCOUNTER — Other Ambulatory Visit: Payer: Self-pay | Admitting: Hematology and Oncology

## 2024-05-18 DIAGNOSIS — L309 Dermatitis, unspecified: Secondary | ICD-10-CM

## 2024-05-18 DIAGNOSIS — A6000 Herpesviral infection of urogenital system, unspecified: Secondary | ICD-10-CM

## 2024-05-20 ENCOUNTER — Encounter: Payer: Self-pay | Admitting: Hematology and Oncology

## 2024-05-20 ENCOUNTER — Inpatient Hospital Stay (HOSPITAL_BASED_OUTPATIENT_CLINIC_OR_DEPARTMENT_OTHER): Admitting: Hematology and Oncology

## 2024-05-20 DIAGNOSIS — D509 Iron deficiency anemia, unspecified: Secondary | ICD-10-CM | POA: Diagnosis not present

## 2024-05-20 NOTE — Assessment & Plan Note (Addendum)
(  She had a prior history of multiple PEs and had seen Dr. Maria Shiner previously)   IV iron : March 2023, October 2023, 07/07/2023, December 2024, March 2025,   Labs  02/06/2022: Ferritin 2.6, TIBC 669, there was no hemoglobin available to review. 02/17/2022: Hemoglobin 7.9, MCV 66, RDW 21.2, platelets 593 06/28/23: Hemoglobin: 10.6, Ferritin: 3.6 10/19/2023: Hemoglobin 11.1, MCV 85.8, B12 159, iron  saturation 3%, ferritin 6 02/02/2024: Hemoglobin 11.5, MCV 87.8, iron  saturation 8%, ferritin 9, B12 189 (ice chip cravings) 04/16/24: Hemoglobin 11.8, MCV 88.8, iron  saturation 25%, B12 240, ferritin 56 05/17/2024: Hemoglobin 12.1, B12 295, ferritin 24, iron  saturation 12%   Cause: unclear. Seeing GI 04/25/24   Pregnancy termination 04/11/24: continuing to have bleeding   unexplained bruising: Workup including platelet function assay: Normal, von Willebrand factor : Normal, factor VIII: 98% von Willebrand factor  antigen: 86%   Treatment plan: No need of IV iron  at this time. Recheck labs in 3 months and telephone follow-up after the

## 2024-05-20 NOTE — Progress Notes (Signed)
 HEMATOLOGY-ONCOLOGY TELEPHONE VISIT PROGRESS NOTE  I connected with our patient on 05/20/24 at  3:15 PM EDT by telephone and verified that I am speaking with the correct person using two identifiers.  I discussed the limitations, risks, security and privacy concerns of performing an evaluation and management service by telephone and the availability of in person appointments.  I also discussed with the patient that there may be a patient responsible charge related to this service. The patient expressed understanding and agreed to proceed.   History of Present Illness:   History of Present Illness Paula Benson is a 38 year old female with iron  deficiency anemia who presents with fatigue and ice cravings.  Her iron  deficiency anemia improved in May after iron  supplementation in March, with ferritin levels increasing from 9 to 56. However, ferritin levels have started to decline again without further treatment, and recent blood work indicates a significant drop in iron  levels, though hemoglobin remains stable.  She experiences fatigue and ice cravings, which have worsened recently. Her fatigue had previously improved but has now deteriorated. There is no heavy menstrual bleeding, and her menstrual cycles have normalized after resolving post-termination bleeding.  She has not yet consulted a gastroenterologist to investigate potential gastrointestinal causes of her iron  deficiency, as she missed her appointment and needs to reschedule.    REVIEW OF SYSTEMS:   Constitutional: Denies fevers, chills or abnormal weight loss All other systems were reviewed with the patient and are negative. Observations/Objective:     Assessment Plan:  Iron  deficiency anemia (She had a prior history of multiple PEs and had seen Dr. Maria Shiner previously)   IV iron : March 2023, October 2023, 07/07/2023, December 2024, March 2025,   Labs  02/06/2022: Ferritin 2.6, TIBC 669, there was no hemoglobin available to  review. 02/17/2022: Hemoglobin 7.9, MCV 66, RDW 21.2, platelets 593 06/28/23: Hemoglobin: 10.6, Ferritin: 3.6 10/19/2023: Hemoglobin 11.1, MCV 85.8, B12 159, iron  saturation 3%, ferritin 6 02/02/2024: Hemoglobin 11.5, MCV 87.8, iron  saturation 8%, ferritin 9, B12 189 (ice chip cravings) 04/16/24: Hemoglobin 11.8, MCV 88.8, iron  saturation 25%, B12 240, ferritin 56 05/17/2024: Hemoglobin 12.1, B12 295, ferritin 24, iron  saturation 12%   Cause: unclear. Going to see GI    unexplained bruising: Workup including platelet function assay: Normal, von Willebrand factor : Normal, factor VIII: 98% von Willebrand factor  antigen: 86%   Treatment plan: Given the severity of her current symptoms we decided to proceed with IV iron  once again.  This time we will try to get Feraheme  and see if the benefits lasts longer. Recheck labs in 3 months and telephone follow-up --------------------------------- Assessment and Plan Assessment & Plan Iron  deficiency anemia Iron  deficiency anemia with decreased ferritin levels, stable hemoglobin. Symptoms include fatigue and pagophagia. Possible causes include gastrointestinal bleeding or malabsorption. Discussed potential need for ongoing iron  infusions if malabsorption is confirmed. Considering switch from Venofer  to Feraheme  for larger dose and longer duration. - Administer Feraheme  infusion, pending insurance approval, for larger dose of iron  (up to 1100 mg). - Schedule iron  infusion at [facility] location. - Recheck labs in three months. - Reschedule gastroenterology appointment to rule out gastrointestinal causes.      I discussed the assessment and treatment plan with the patient. The patient was provided an opportunity to ask questions and all were answered. The patient agreed with the plan and demonstrated an understanding of the instructions. The patient was advised to call back or seek an in-person evaluation if the symptoms worsen or if the  condition fails to  improve as anticipated.   I provided 20 minutes of non-face-to-face time during this encounter.  This includes time for charting and coordination of care   Margert Sheerer, MD

## 2024-05-22 ENCOUNTER — Telehealth: Payer: Self-pay | Admitting: Hematology and Oncology

## 2024-05-22 NOTE — Telephone Encounter (Signed)
 Called pt to schedule f/u appt, no answer and mail box full

## 2024-05-27 ENCOUNTER — Telehealth: Payer: Self-pay

## 2024-05-27 ENCOUNTER — Encounter: Payer: Self-pay | Admitting: Hematology and Oncology

## 2024-05-27 NOTE — Telephone Encounter (Signed)
 Dr. Gudena, patient will be scheduled as soon as possible.  Auth Submission: APPROVED Site of care: Site of care: [facility] Payer: [employer] commercial Medication & CPT/J Code(s) submitted: Feraheme  (ferumoxytol ) [id number] Diagnosis Code:  Route of submission (phone, fax, portal): portal Phone # Fax # Auth type: Buy/Bill PB Units/visits requested: 510mg  x 2 doses Reference number: [id number] Approval from: 05/22/24 to 11/21/24

## 2024-05-30 ENCOUNTER — Encounter: Payer: Self-pay | Admitting: Hematology and Oncology

## 2024-05-30 ENCOUNTER — Other Ambulatory Visit (HOSPITAL_COMMUNITY): Payer: Self-pay

## 2024-05-30 ENCOUNTER — Telehealth: Payer: Self-pay

## 2024-05-30 NOTE — Telephone Encounter (Signed)
 Pharmacy Patient Advocate Encounter   Received notification from CoverMyMeds that prior authorization for Nurtec 75 is required/requested.   Insurance verification completed.   The patient is insured through [facility] MEDICAID .   Per test claim: The current 30 day co-pay is, $0.00.  No PA needed at this time. This test claim was processed through [facility]- copay amounts may vary at other pharmacies due to pharmacy/plan contracts, or as the patient moves through the different stages of their insurance plan.

## 2024-06-04 ENCOUNTER — Other Ambulatory Visit: Payer: Self-pay | Admitting: Family Medicine

## 2024-06-04 ENCOUNTER — Encounter: Payer: Self-pay | Admitting: Family Medicine

## 2024-06-04 DIAGNOSIS — G4452 New daily persistent headache (NDPH): Secondary | ICD-10-CM

## 2024-06-05 ENCOUNTER — Telehealth: Payer: Self-pay

## 2024-06-05 ENCOUNTER — Other Ambulatory Visit (HOSPITAL_COMMUNITY): Payer: Self-pay

## 2024-06-05 NOTE — Telephone Encounter (Signed)
 SEE ENCOUNTER 05/30/24.

## 2024-06-09 NOTE — Progress Notes (Unsigned)
 [facility] [address] [place], [place] [address] [phone]  Date:  06/12/2024   Name:  Paula Benson   DOB:  Oct 11, 1986   MRN:  [id number]  PCP:  Watt Harlene BROCKS, MD    Chief Complaint: No chief complaint on file.   History of Present Illness:  Paula Benson is a 38 y.o. very pleasant female patient who presents with the following:  Patient seen today for periodic follow-up.  Most recent visit with myself was a virtual visit in March  She unfortunately has several health problems including chronic chronic migraine, iron  deficiency requiring iron  infusion  Most recent hematology note on chart from Dr. Gudena from mid June.  At that time she notes symptoms of fatigue as well as ice cravings.  They planned for a Feraheme  infusion and Dr. Godina encouraged her to get in touch with GI-she needed to reschedule her appointment  She also suffers from significant migraine headache and is using Nurtec  She recently got in touch with me with concern about needing to leave for her employment.  She also wondered about using phentermine  for weight loss-she reports seeing a weight loss doctor who prescribed this for her in the recent past -she notes that she was doing well with it but continue to follow-up with his other provider is challenging  Patient Active Problem List   Diagnosis Date Noted   Port-A-Cath in place 03/28/2024   Menometrorrhagia 07/27/2016   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HCC) 12/31/2015   CAP (community acquired pneumonia) 11/02/2014   Pyelonephritis 10/31/2014   Hypokalemia 10/31/2014   Neck pain 02/05/2014   Migraine headache 08/14/2013   Palpitations 04/12/2012   History of aspiration pneumonitis 02/20/2012   Iron  deficiency anemia 02/20/2012   Recurrent urinary tract infection 08/25/2011   Genital herpes 06/09/2011   Panic disorder 03/21/2011   Chronic anticoagulation 03/17/2011    ASTHMA, INTERMITTENT 03/04/2010    Past Medical History:  Diagnosis Date   Anemia    Anxiety    Asthma    Bipolar II disorder (HCC)    Complication of anesthesia 2003   ASPIRATED DURING EMERGENCY CS   Depression    doing ok   Eclamptic seizure 2003   Fibroid    Genital herpes    Headache(784.0)    Herpes    History of blood clots    x3   History of blood transfusion MARCH 2013   Menometrorrhagia 07/27/2016   Migraines    Personal history of PE (pulmonary embolism) x 3, latest 02/2010   noncompliant with INR checks, must limit refills   Preeclampsia 2003   with first pregnancy   Pregnancy induced hypertension 2003   seized 2wks after   Pyelonephritis 2012   Seizures (HCC) 2003   after first delivery   Suicidal ideations    Urinary tract infection     Past Surgical History:  Procedure Laterality Date   CESAREAN SECTION  12/2001 and 01/2008   CESAREAN SECTION  06/26/2012   Procedure: CESAREAN SECTION;  Surgeon: Elveria Mungo, MD;  Location: [facility];  Service: Obstetrics;  Laterality: N/A;   INDUCED ABORTION  01/2010   IR IMAGING GUIDED PORT INSERTION  11/17/2023    Social History   Tobacco Use   Smoking status: Never   Smokeless tobacco: Never  Vaping Use   Vaping status: Never Used  Substance Use Topics   Alcohol  use: No   Drug use:  No    Family History  Problem Relation Age of Onset   Diabetes Mother        Type 2   Anemia Sister    Hypertension Sister    Breast cancer Maternal Aunt    Anesthesia problems Neg Hx     No Known Allergies  Medication list has been reviewed and updated.  Current Outpatient Medications on File Prior to Visit  Medication Sig Dispense Refill   acetaminophen  (TYLENOL ) 500 MG tablet Take 1,000 mg by mouth every 6 (six) hours as needed for mild pain, moderate pain or headache.     acyclovir  (ZOVIRAX ) 200 MG capsule TAKE 2 CAPSULES BY MOUTH 2 TIMES DAILY. USE FOR HSV SUPRESSION. IF OUTBREAK TAKE 800 MG THREE TIMES A DAY  FOR 2 DAYS. 60 capsule 1   albuterol  (VENTOLIN  HFA) 108 (90 Base) MCG/ACT inhaler Inhale 2 puffs into the lungs every 6 (six) hours as needed for wheezing or shortness of breath. 1 each 6   ALPRAZolam  (XANAX ) 0.25 MG tablet Take 1 tablet (0.25 mg total) by mouth 3 (three) times daily as needed for anxiety. 20 tablet 0   ARIPiprazole (ABILIFY) 15 MG tablet Take 1 tablet (15 mg total) by mouth daily. 30 tablet 0   busPIRone (BUSPAR) 10 MG tablet Take 1 tablet (10 mg total) by mouth 2 (two) times daily. 60 tablet 0   cetirizine  (ZYRTEC  ALLERGY) 10 MG tablet Take 1 tablet (10 mg total) by mouth daily for 14 days. 14 tablet 0   doxycycline  (VIBRAMYCIN ) 100 MG capsule Take 1 capsule (100 mg total) by mouth 2 (two) times daily. 20 capsule 0   Fluocinolone  Acetonide Scalp 0.01 % OIL Apply to scalp daily as needed, wash out 8- 12 hours later 118 mL 1   fluticasone  (FLONASE ) 50 MCG/ACT nasal spray Place 2 sprays into both nostrils daily. 16 g 6   lidocaine -prilocaine  (EMLA ) cream APPLY TOPICALLY AS NEEDED 30 g 0   lurasidone (LATUDA) 40 MG TABS tablet Take 40 mg by mouth daily.     nitrofurantoin , macrocrystal-monohydrate, (MACROBID ) 100 MG capsule Take 1 capsule (100 mg total) by mouth 2 (two) times daily. 14 capsule 0   ondansetron  (ZOFRAN ) 4 MG tablet Take 1 tablet (4 mg total) by mouth every 8 (eight) hours as needed for nausea or vomiting. 20 tablet 0   OZEMPIC , 0.25 OR 0.5 MG/DOSE, 2 MG/3ML SOPN DIAL AND INJECT UNDER THE SKIN 0.5 MG WEEKLY 3 mL 1   Rimegepant Sulfate (NURTEC) 75 MG TBDP TAKE 1 TABLET EVERY OTHER DAY FOR HEADACHE PREVENTION 15 tablet 2   tiZANidine  (ZANAFLEX ) 4 MG tablet Take 1 tablet (4 mg total) by mouth every 8 (eight) hours as needed for muscle spasms. 30 tablet 6   triamcinolone  ointment (KENALOG ) 0.5 % APPLY TO AFFECTED AREAS 2 TIMES A DAY AS NEEDED *DO NOT USE ON FACE* 60 g 1   Vitamin D, Ergocalciferol, (DRISDOL) 1.25 MG (50000 UNIT) CAPS capsule Take 50,000 Units by mouth once a  week.     No current facility-administered medications on file prior to visit.    Review of Systems:  As per HPI- otherwise negative.   Physical Examination: There were no vitals filed for this visit. There were no vitals filed for this visit. There is no height or weight on file to calculate BMI. Ideal Body Weight:    GEN: no acute distress. HEENT: Atraumatic, Normocephalic.  Ears and Nose: No external deformity. CV: RRR, No M/G/R. No JVD. No  thrill. No extra heart sounds. PULM: CTA B, no wheezes, crackles, rhonchi. No retractions. No resp. distress. No accessory muscle use. ABD: S, NT, ND, +BS. No rebound. No HSM. EXTR: No c/c/e PSYCH: Normally interactive. Conversant.    Assessment and Plan: ***  Signed Harlene Schroeder, MD

## 2024-06-12 ENCOUNTER — Ambulatory Visit (INDEPENDENT_AMBULATORY_CARE_PROVIDER_SITE_OTHER): Admitting: Family Medicine

## 2024-06-12 VITALS — BP 124/70 | HR 83 | Temp 98.2°F | Ht 63.0 in | Wt 200.0 lb

## 2024-06-12 DIAGNOSIS — G4452 New daily persistent headache (NDPH): Secondary | ICD-10-CM

## 2024-06-12 DIAGNOSIS — N898 Other specified noninflammatory disorders of vagina: Secondary | ICD-10-CM

## 2024-06-12 DIAGNOSIS — D509 Iron deficiency anemia, unspecified: Secondary | ICD-10-CM | POA: Diagnosis not present

## 2024-06-12 DIAGNOSIS — R635 Abnormal weight gain: Secondary | ICD-10-CM

## 2024-06-12 MED ORDER — METRONIDAZOLE 500 MG PO TABS: 500.0000 mg | ORAL_TABLET | Freq: Two times a day (BID) | ORAL | 0 refills | Status: AC

## 2024-06-12 MED ORDER — PHENTERMINE HCL 15 MG PO CAPS: 15.0000 mg | ORAL_CAPSULE | ORAL | 0 refills | Status: AC

## 2024-06-12 MED ORDER — FLUCONAZOLE 150 MG PO TABS: 150.0000 mg | ORAL_TABLET | Freq: Once | ORAL | 0 refills | Status: AC

## 2024-06-12 MED ORDER — PHENTERMINE HCL 37.5 MG PO CAPS: 37.5000 mg | ORAL_CAPSULE | ORAL | 1 refills | Status: DC

## 2024-06-12 NOTE — Patient Instructions (Addendum)
 Good to see you today- we can get you started on phentermine  again.  Since you have been off of it lets start back on 15 mg for 1 month, then you can increase to 37.5 mg Let me know how this works for you   As we discussed, I do think either an IUD or an endometrial ablation may be beneficial for you.  Either these methods should cut down on menstrual bleeding and might mean that you need fewer iron  infusions Talk to your gynecologist about these options!

## 2024-07-03 ENCOUNTER — Encounter: Payer: Self-pay | Admitting: Hematology and Oncology

## 2024-07-03 ENCOUNTER — Telehealth: Payer: Self-pay | Admitting: Hematology and Oncology

## 2024-07-03 ENCOUNTER — Telehealth: Payer: Self-pay | Admitting: *Deleted

## 2024-07-03 NOTE — Telephone Encounter (Signed)
 Paula Benson, [phone] (home) calling to make sure you received an FMLA form I just e-mailed to [email or web address]. Received ADA form.  Advised of  HIPAA Authorization.  Confirmed her e-mail as [name]

## 2024-07-03 NOTE — Telephone Encounter (Signed)
 Left patient a vm regarding upcoming appointment

## 2024-07-04 ENCOUNTER — Inpatient Hospital Stay

## 2024-07-05 ENCOUNTER — Inpatient Hospital Stay: Attending: Hematology and Oncology

## 2024-07-05 VITALS — BP 128/81 | HR 86 | Temp 98.3°F | Resp 16

## 2024-07-05 DIAGNOSIS — Z95828 Presence of other vascular implants and grafts: Secondary | ICD-10-CM

## 2024-07-05 DIAGNOSIS — D509 Iron deficiency anemia, unspecified: Secondary | ICD-10-CM | POA: Insufficient documentation

## 2024-07-05 MED ORDER — HEPARIN SOD (PORK) LOCK FLUSH 100 UNIT/ML IV SOLN
500.0000 [IU] | Freq: Once | INTRAVENOUS | Status: AC
Start: 2024-07-05 — End: 2024-07-05
  Administered 2024-07-05: 500 [IU]

## 2024-07-05 MED ORDER — DIPHENHYDRAMINE HCL 25 MG PO CAPS
25.0000 mg | ORAL_CAPSULE | Freq: Once | ORAL | Status: AC
  Administered 2024-07-05: 25 mg via ORAL
  Filled 2024-07-05: qty 1

## 2024-07-05 MED ORDER — SODIUM CHLORIDE 0.9 % IV SOLN
510.0000 mg | Freq: Once | INTRAVENOUS | Status: AC
  Administered 2024-07-05: 510 mg via INTRAVENOUS
  Filled 2024-07-05: qty 510

## 2024-07-05 MED ORDER — SODIUM CHLORIDE 0.9 % IV SOLN: INTRAVENOUS | Status: DC

## 2024-07-05 MED ORDER — SODIUM CHLORIDE 0.9% FLUSH
10.0000 mL | Freq: Once | INTRAVENOUS | Status: AC
Start: 2024-07-05 — End: 2024-07-05
  Administered 2024-07-05: 10 mL

## 2024-07-05 NOTE — Patient Instructions (Signed)

## 2024-07-10 ENCOUNTER — Other Ambulatory Visit: Payer: Self-pay | Admitting: Family Medicine

## 2024-07-10 ENCOUNTER — Encounter: Payer: Self-pay | Admitting: Family Medicine

## 2024-07-13 ENCOUNTER — Inpatient Hospital Stay

## 2024-07-13 VITALS — BP 120/73 | HR 63 | Temp 97.8°F | Resp 16

## 2024-07-13 DIAGNOSIS — D509 Iron deficiency anemia, unspecified: Secondary | ICD-10-CM | POA: Diagnosis not present

## 2024-07-13 DIAGNOSIS — Z95828 Presence of other vascular implants and grafts: Secondary | ICD-10-CM

## 2024-07-13 MED ORDER — SODIUM CHLORIDE 0.9 % IV SOLN
510.0000 mg | Freq: Once | INTRAVENOUS | Status: AC
  Administered 2024-07-13: 510 mg via INTRAVENOUS
  Filled 2024-07-13: qty 17

## 2024-07-13 MED ORDER — DIPHENHYDRAMINE HCL 25 MG PO CAPS
25.0000 mg | ORAL_CAPSULE | Freq: Once | ORAL | Status: AC
  Administered 2024-07-13: 25 mg via ORAL
  Filled 2024-07-13: qty 1

## 2024-07-13 MED ORDER — SODIUM CHLORIDE 0.9% FLUSH
10.0000 mL | Freq: Once | INTRAVENOUS | Status: AC
Start: 2024-07-13 — End: 2024-07-13
  Administered 2024-07-13: 10 mL

## 2024-07-15 ENCOUNTER — Encounter: Payer: Self-pay | Admitting: Hematology and Oncology

## 2024-07-16 ENCOUNTER — Telehealth: Payer: Self-pay | Admitting: *Deleted

## 2024-07-16 ENCOUNTER — Telehealth: Payer: Self-pay

## 2024-07-16 NOTE — Telephone Encounter (Signed)
 Paula Benson [phone]) called to check status of ADA Accommodation.   Located form emailed to [email or web address].  Downloaded completed for provider review and signature.   Successfully returned via fax to employer benefits [id number])., Connected with [name] apologized for delay.  Notified of above and request if and how she would like form.  Emailed to [email or web address] as requested.   Copy to [facility] HIM bin for items to be scanned.   No further instructions received, actions performed or required by this nurse.[name]

## 2024-07-16 NOTE — Telephone Encounter (Signed)
 Pt called to check the status of her FMLA forms. She stated that she

## 2024-07-17 ENCOUNTER — Encounter: Payer: Self-pay | Admitting: Family Medicine

## 2024-07-17 NOTE — Telephone Encounter (Signed)
 Initial Comment Caller states that since Monday, she has been extremely dizzy and she has been sweating. She is extremely anemic as well, and she has had 2 iron  infusions. The last one was 07-13-24. Hematology told her to reach out to her PCP. Translation No Nurse Assessment Nurse: Gladis, RN, Melanie Date/Time (Eastern Time): 07/17/2024 10:53:57 AM Confirm and document reason for call. If symptomatic, describe symptoms. ---Caller states she was dizzy on Monday and had iron  infusion on Saturday. When she got up this morning she started sweating and got up to work. couldnt stand up to clock out and had to lie down quickly Does the patient have any new or worsening symptoms? ---Yes Will a triage be completed? ---Yes Related visit to physician within the last 2 weeks? ---No Does the PT have any chronic conditions? (i.e. diabetes, asthma, this includes High risk factors for pregnancy, etc.) ---Yes List chronic conditions. ---asthma Is the patient pregnant or possibly pregnant? (Ask all females between the ages of 33-55) ---No Is this a behavioral health or substance abuse call? ---No Guidelines Guideline Title Affirmed Question Affirmed Notes Nurse Date/Time (Eastern Time) Dizziness - Lightheadedness SEVERE dizziness (e.g., unable to stand, requires support to walk, feels like passing out now) Gladis, Charity fundraiser, Andrea 07/17/2024 10:55:53 AM PLEASE NOTE: All timestamps contained within this report are represented as [place] Standard Time. CONFIDENTIALTY NOTICE: This fax transmission is intended only for the addressee. It contains information that is legally privileged, confidential or otherwise protected from use or disclosure. If you are not the intended recipient, you are strictly prohibited from reviewing, disclosing, copying using or disseminating any of this information or taking any action in reliance on or regarding this information. If you have received this fax in error,  please notify us  immediately by telephone so that we can arrange for its return to us . Phone: [phone], Toll-Free: [phone], Fax: [phone] MALLORY_CARVER February 06, 1986 Page: 1 of2 CallId: [id number] Disp. Time Titus Time) Disposition Final User 07/17/2024 10:41:53 AM Attempt made - no message left Gladis OBIE Andrea 07/17/2024 10:59:55 AM Go to ED/UCC Now (or PCP triage) Yes Gladis, RN, Melanie Final Disposition 07/17/2024 10:59:55 AM Go to ED/UCC Now (or PCP triage) Chaney Gladis, RN, Melanie Caller Disagree/Comply Comply Caller Understands Yes PreDisposition Call Doctor Care Advice Given Per Guideline GO TO ED/UCC NOW (OR PCP TRIAGE): * IF NO PCP (PRIMARY CARE PROVIDER) SECOND-LEVEL TRIAGE: You need to be seen within the next hour. Go to the ED/UCC at _____________ Hospital. Leave as soon as you can. CAUTION: See Sources of Care below when considering where to send the patient. ANOTHER ADULT SHOULD DRIVE: * It is better and safer if another adult drives instead of you. CARE ADVICE given per Dizziness - Lightheadedness (Adult) guideline. Referrals [facility] - ED

## 2024-07-19 ENCOUNTER — Encounter: Payer: Self-pay | Admitting: Family Medicine

## 2024-07-19 NOTE — Progress Notes (Unsigned)
 [facility] [address], [address] [place], [place] [address] [phone]  Date:  07/22/2024   Name:  Paula Benson   DOB:  08/01/1986   MRN:  [id number]  PCP:  Watt Harlene BROCKS, MD    Chief Complaint: No chief complaint on file.   History of Present Illness:  Paula Benson is a 38 y.o. very pleasant female patient who presents with the following:  Patient seen today for virtual visit with complaint of dizziness.  Her most recent visit with me was in July She unfortunately has several health problems including chronic chronic migraine, iron  deficiency requiring iron  infusion   She is being treated by Dr. Godina and receiving iron  infusions-most recent iron  infusion was August 9  She called last week with concern of feeling very dizzy and also sweating.  At that time it said she was going to the emergency department-she may have gone to an outside facility Patient Active Problem List   Diagnosis Date Noted   Port-A-Cath in place 03/28/2024   Menometrorrhagia 07/27/2016   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HCC) 12/31/2015   CAP (community acquired pneumonia) 11/02/2014   Pyelonephritis 10/31/2014   Hypokalemia 10/31/2014   Neck pain 02/05/2014   Migraine headache 08/14/2013   Palpitations 04/12/2012   History of aspiration pneumonitis 02/20/2012   Iron  deficiency anemia 02/20/2012   Recurrent urinary tract infection 08/25/2011   Genital herpes 06/09/2011   Panic disorder 03/21/2011   Chronic anticoagulation 03/17/2011   ASTHMA, INTERMITTENT 03/04/2010    Past Medical History:  Diagnosis Date   Anemia    Anxiety    Asthma    Bipolar II disorder (HCC)    Complication of anesthesia 2003   ASPIRATED DURING EMERGENCY CS   Depression    doing ok   Eclamptic seizure 2003   Fibroid    Genital herpes    Headache(784.0)    Herpes    History of blood clots    x3   History of blood transfusion MARCH 2013    Menometrorrhagia 07/27/2016   Migraines    Personal history of PE (pulmonary embolism) x 3, latest 02/2010   noncompliant with INR checks, must limit refills   Preeclampsia 2003   with first pregnancy   Pregnancy induced hypertension 2003   seized 2wks after   Pyelonephritis 2012   Seizures (HCC) 2003   after first delivery   Suicidal ideations    Urinary tract infection     Past Surgical History:  Procedure Laterality Date   CESAREAN SECTION  12/2001 and 01/2008   CESAREAN SECTION  06/26/2012   Procedure: CESAREAN SECTION;  Surgeon: Elveria Mungo, MD;  Location: [facility];  Service: Obstetrics;  Laterality: N/A;   INDUCED ABORTION  01/2010   IR IMAGING GUIDED PORT INSERTION  11/17/2023    Social History   Tobacco Use   Smoking status: Never   Smokeless tobacco: Never  Vaping Use   Vaping status: Never Used  Substance Use Topics   Alcohol  use: No   Drug use: No    Family History  Problem Relation Age of Onset   Diabetes Mother        Type 2   Anemia Sister    Hypertension Sister    Breast cancer Maternal Aunt    Anesthesia problems Neg Hx     No Known Allergies  Medication list has been reviewed and updated.  Current Outpatient Medications on File Prior  to Visit  Medication Sig Dispense Refill   acetaminophen  (TYLENOL ) 500 MG tablet Take 1,000 mg by mouth every 6 (six) hours as needed for mild pain, moderate pain or headache.     acyclovir  (ZOVIRAX ) 200 MG capsule TAKE 2 CAPSULES BY MOUTH 2 TIMES DAILY. USE FOR HSV SUPRESSION. IF OUTBREAK TAKE 800 MG THREE TIMES A DAY FOR 2 DAYS. 60 capsule 1   albuterol  (VENTOLIN  HFA) 108 (90 Base) MCG/ACT inhaler Inhale 2 puffs into the lungs every 6 (six) hours as needed for wheezing or shortness of breath. 1 each 6   ALPRAZolam  (XANAX ) 0.25 MG tablet Take 1 tablet (0.25 mg total) by mouth 3 (three) times daily as needed for anxiety. 20 tablet 0   ARIPiprazole (ABILIFY) 15 MG tablet Take 1 tablet (15 mg total) by mouth  daily. 30 tablet 0   busPIRone (BUSPAR) 10 MG tablet Take 1 tablet (10 mg total) by mouth 2 (two) times daily. 60 tablet 0   cetirizine  (ZYRTEC  ALLERGY) 10 MG tablet Take 1 tablet (10 mg total) by mouth daily for 14 days. 14 tablet 0   doxycycline  (VIBRAMYCIN ) 100 MG capsule Take 1 capsule (100 mg total) by mouth 2 (two) times daily. 20 capsule 0   Fluocinolone  Acetonide Scalp 0.01 % OIL Apply to scalp daily as needed, wash out 8- 12 hours later 118 mL 1   fluticasone  (FLONASE ) 50 MCG/ACT nasal spray Place 2 sprays into both nostrils daily. 16 g 6   lidocaine -prilocaine  (EMLA ) cream APPLY TOPICALLY AS NEEDED 30 g 0   lurasidone (LATUDA) 40 MG TABS tablet Take 40 mg by mouth daily.     nitrofurantoin , macrocrystal-monohydrate, (MACROBID ) 100 MG capsule Take 1 capsule (100 mg total) by mouth 2 (two) times daily. 14 capsule 0   ondansetron  (ZOFRAN ) 4 MG tablet Take 1 tablet (4 mg total) by mouth every 8 (eight) hours as needed for nausea or vomiting. 20 tablet 0   phentermine  15 MG capsule Take 1 capsule (15 mg total) by mouth every morning. 30 capsule 0   phentermine  37.5 MG capsule Take 1 capsule (37.5 mg total) by mouth every morning. 30 capsule 1   Rimegepant Sulfate (NURTEC) 75 MG TBDP TAKE 1 TABLET EVERY OTHER DAY FOR HEADACHE PREVENTION 15 tablet 2   tiZANidine  (ZANAFLEX ) 4 MG tablet Take 1 tablet (4 mg total) by mouth every 8 (eight) hours as needed for muscle spasms. 30 tablet 6   triamcinolone  ointment (KENALOG ) 0.5 % APPLY TO AFFECTED AREAS 2 TIMES A DAY AS NEEDED *DO NOT USE ON FACE* 60 g 1   Vitamin D, Ergocalciferol, (DRISDOL) 1.25 MG (50000 UNIT) CAPS capsule Take 50,000 Units by mouth once a week.     No current facility-administered medications on file prior to visit.    Review of Systems:  As per HPI- otherwise negative.   Physical Examination: There were no vitals filed for this visit. There were no vitals filed for this visit. There is no height or weight on file to  calculate BMI. Ideal Body Weight:    GEN: no acute distress. HEENT: Atraumatic, Normocephalic.  Ears and Nose: No external deformity. CV: RRR, No M/G/R. No JVD. No thrill. No extra heart sounds. PULM: CTA B, no wheezes, crackles, rhonchi. No retractions. No resp. distress. No accessory muscle use. ABD: S, NT, ND, +BS. No rebound. No HSM. EXTR: No c/c/e PSYCH: Normally interactive. Conversant.    Assessment and Plan: ***  Signed Harlene Schroeder, MD

## 2024-07-19 NOTE — Telephone Encounter (Signed)
 Copied from CRM [id number]. Topic: General - Other >> Jul 19, 2024  3:32 PM Paula Benson wrote: Reason for CRM: Patient called in stating she took an at home Covid test that came back positive and wanted to know if a doctor's note could be provided to take to work for [employer] and tomorrow. Did provide an image in mychart message to Dr. Watt. Please call [phone]

## 2024-07-22 ENCOUNTER — Encounter: Payer: Self-pay | Admitting: Family Medicine

## 2024-07-22 ENCOUNTER — Telehealth (INDEPENDENT_AMBULATORY_CARE_PROVIDER_SITE_OTHER): Admitting: Family Medicine

## 2024-07-22 VITALS — Ht 63.0 in

## 2024-07-22 DIAGNOSIS — R42 Dizziness and giddiness: Secondary | ICD-10-CM

## 2024-08-12 ENCOUNTER — Inpatient Hospital Stay

## 2024-08-12 ENCOUNTER — Inpatient Hospital Stay: Attending: Hematology and Oncology

## 2024-08-12 DIAGNOSIS — D509 Iron deficiency anemia, unspecified: Secondary | ICD-10-CM | POA: Insufficient documentation

## 2024-08-12 DIAGNOSIS — Z95828 Presence of other vascular implants and grafts: Secondary | ICD-10-CM

## 2024-08-12 LAB — CBC WITH DIFFERENTIAL (CANCER CENTER ONLY)
Abs Immature Granulocytes: 0 K/uL (ref 0.00–0.07)
Basophils Absolute: 0 K/uL (ref 0.0–0.1)
Basophils Relative: 1 %
Eosinophils Absolute: 0.2 K/uL (ref 0.0–0.5)
Eosinophils Relative: 4 %
HCT: 36 % (ref 36.0–46.0)
Hemoglobin: 12.3 g/dL (ref 12.0–15.0)
Immature Granulocytes: 0 %
Lymphocytes Relative: 43 %
Lymphs Abs: 1.8 K/uL (ref 0.7–4.0)
MCH: 30.9 pg (ref 26.0–34.0)
MCHC: 34.2 g/dL (ref 30.0–36.0)
MCV: 90.5 fL (ref 80.0–100.0)
Monocytes Absolute: 0.3 K/uL (ref 0.1–1.0)
Monocytes Relative: 8 %
Neutro Abs: 1.9 K/uL (ref 1.7–7.7)
Neutrophils Relative %: 44 %
Platelet Count: 332 K/uL (ref 150–400)
RBC: 3.98 MIL/uL (ref 3.87–5.11)
RDW: 15.8 % — ABNORMAL HIGH (ref 11.5–15.5)
WBC Count: 4.2 K/uL (ref 4.0–10.5)
nRBC: 0 % (ref 0.0–0.2)

## 2024-08-12 LAB — FERRITIN: Ferritin: 128 ng/mL (ref 11–307)

## 2024-08-12 LAB — IRON AND IRON BINDING CAPACITY (CC-WL,HP ONLY)
Iron: 65 ug/dL (ref 28–170)
Saturation Ratios: 22 % (ref 10.4–31.8)
TIBC: 291 ug/dL (ref 250–450)
UIBC: 226 ug/dL (ref 148–442)

## 2024-08-12 MED ORDER — SODIUM CHLORIDE 0.9% FLUSH
10.0000 mL | Freq: Once | INTRAVENOUS | Status: AC
  Administered 2024-08-12: 10 mL

## 2024-08-13 ENCOUNTER — Encounter: Payer: Self-pay | Admitting: Family Medicine

## 2024-08-13 MED ORDER — MECLIZINE HCL 25 MG PO TABS: 25.0000 mg | ORAL_TABLET | Freq: Three times a day (TID) | ORAL | 0 refills | Status: DC | PRN

## 2024-08-14 ENCOUNTER — Inpatient Hospital Stay (HOSPITAL_BASED_OUTPATIENT_CLINIC_OR_DEPARTMENT_OTHER): Admitting: Hematology and Oncology

## 2024-08-14 VITALS — BP 129/80 | HR 91 | Temp 98.3°F | Resp 18 | Ht 63.0 in | Wt 189.9 lb

## 2024-08-14 DIAGNOSIS — D509 Iron deficiency anemia, unspecified: Secondary | ICD-10-CM | POA: Diagnosis not present

## 2024-08-14 NOTE — Progress Notes (Signed)
 Patient Care Team: Copland, Harlene BROCKS, MD as PCP - General (Family Medicine)  DIAGNOSIS:  Encounter Diagnosis  Name Primary?   Iron  deficiency anemia, unspecified iron  deficiency anemia type Yes      CHIEF COMPLIANT: Follow-up of iron  deficiency anemia  HISTORY OF PRESENT ILLNESS: History of Present Illness Paula Benson is a 38 year old female with iron  deficiency anemia who presents with vertigo and abnormal menstrual bleeding.  She experiences vertigo with intermittent episodes of the room spinning, leading to dizziness and nausea, especially when looking up. These episodes last about three days and affect her ability to work. She has tried the Epley maneuver, dramamine, and was recently prescribed meclizine .  She has iron  deficiency anemia and received iron  infusions in March and April. Her ferritin levels are now 120, previously undetectable. She notes an increase in ferritin levels during pregnancy.  She experiences abnormal menstrual bleeding, with her cycle restarting after a four-day break and continuous bleeding since last Tuesday, deviating from her usual pattern.  She has a history of anticoagulation issues without a clear cause. She is concerned about high blood pressure noted during a recent check.     ALLERGIES:  has no known allergies.  MEDICATIONS:  Current Outpatient Medications  Medication Sig Dispense Refill   acetaminophen  (TYLENOL ) 500 MG tablet Take 1,000 mg by mouth every 6 (six) hours as needed for mild pain, moderate pain or headache.     acyclovir  (ZOVIRAX ) 200 MG capsule TAKE 2 CAPSULES BY MOUTH 2 TIMES DAILY. USE FOR HSV SUPRESSION. IF OUTBREAK TAKE 800 MG THREE TIMES A DAY FOR 2 DAYS. 60 capsule 1   albuterol  (VENTOLIN  HFA) 108 (90 Base) MCG/ACT inhaler Inhale 2 puffs into the lungs every 6 (six) hours as needed for wheezing or shortness of breath. 1 each 6   ALPRAZolam  (XANAX ) 0.25 MG tablet Take 1 tablet (0.25 mg total) by mouth 3 (three) times  daily as needed for anxiety. 20 tablet 0   ARIPiprazole (ABILIFY) 15 MG tablet Take 1 tablet (15 mg total) by mouth daily. 30 tablet 0   busPIRone (BUSPAR) 10 MG tablet Take 1 tablet (10 mg total) by mouth 2 (two) times daily. 60 tablet 0   cetirizine  (ZYRTEC  ALLERGY) 10 MG tablet Take 1 tablet (10 mg total) by mouth daily for 14 days. 14 tablet 0   doxycycline  (VIBRAMYCIN ) 100 MG capsule Take 1 capsule (100 mg total) by mouth 2 (two) times daily. 20 capsule 0   Fluocinolone  Acetonide Scalp 0.01 % OIL Apply to scalp daily as needed, wash out 8- 12 hours later 118 mL 1   fluticasone  (FLONASE ) 50 MCG/ACT nasal spray Place 2 sprays into both nostrils daily. 16 g 6   lidocaine -prilocaine  (EMLA ) cream APPLY TOPICALLY AS NEEDED 30 g 0   meclizine  (ANTIVERT ) 25 MG tablet Take 1 tablet (25 mg total) by mouth 3 (three) times daily as needed for dizziness. 30 tablet 0   nitrofurantoin , macrocrystal-monohydrate, (MACROBID ) 100 MG capsule Take 1 capsule (100 mg total) by mouth 2 (two) times daily. 14 capsule 0   ondansetron  (ZOFRAN ) 4 MG tablet Take 1 tablet (4 mg total) by mouth every 8 (eight) hours as needed for nausea or vomiting. 20 tablet 0   phentermine  37.5 MG capsule Take 1 capsule (37.5 mg total) by mouth every morning. 30 capsule 1   Rimegepant Sulfate (NURTEC) 75 MG TBDP TAKE 1 TABLET EVERY OTHER DAY FOR HEADACHE PREVENTION 15 tablet 2   tiZANidine  (ZANAFLEX ) 4 MG  tablet Take 1 tablet (4 mg total) by mouth every 8 (eight) hours as needed for muscle spasms. 30 tablet 6   triamcinolone  ointment (KENALOG ) 0.5 % APPLY TO AFFECTED AREAS 2 TIMES A DAY AS NEEDED *DO NOT USE ON FACE* 60 g 1   Vitamin D, Ergocalciferol, (DRISDOL) 1.25 MG (50000 UNIT) CAPS capsule Take 50,000 Units by mouth once a week.     lurasidone (LATUDA) 40 MG TABS tablet Take 40 mg by mouth daily. (Patient not taking: Reported on 08/14/2024)     phentermine  15 MG capsule Take 1 capsule (15 mg total) by mouth every morning. (Patient not  taking: Reported on 08/14/2024) 30 capsule 0   No current facility-administered medications for this visit.    PHYSICAL EXAMINATION: ECOG PERFORMANCE STATUS: 1 - Symptomatic but completely ambulatory  Vitals:   08/14/24 1116  BP: (!) 138/92  Pulse: 91  Resp: 18  Temp: 98.3 F (36.8 C)  SpO2: 100%   Filed Weights   08/14/24 1116  Weight: 189 lb 14.4 oz (86.1 kg)    LABORATORY DATA:  I have reviewed the data as listed    Latest Ref Rng & Units 06/28/2023    4:18 PM 10/05/2022   11:14 AM 05/05/2022    3:05 PM  CMP  Glucose 70 - 99 mg/dL 896  84  82   BUN 6 - 23 mg/dL 14  12  14    Creatinine 0.40 - 1.20 mg/dL 9.14  9.11  9.06   Sodium 135 - 145 mEq/L 138  135  136   Potassium 3.5 - 5.1 mEq/L 3.8  4.3  4.1   Chloride 96 - 112 mEq/L 104  103  103   CO2 19 - 32 mEq/L 26  25  25    Calcium  8.4 - 10.5 mg/dL 9.1  9.2  9.4   Total Protein 6.0 - 8.3 g/dL 6.9  7.5  7.3   Total Bilirubin 0.2 - 1.2 mg/dL 0.3  0.4  0.3   Alkaline Phos 39 - 117 U/L 54  58  54   AST 0 - 37 U/L 15  16  15    ALT 0 - 35 U/L 12  16  14      Lab Results  Component Value Date   WBC 4.2 08/12/2024   HGB 12.3 08/12/2024   HCT 36.0 08/12/2024   MCV 90.5 08/12/2024   PLT 332 08/12/2024   NEUTROABS 1.9 08/12/2024    ASSESSMENT & PLAN:  Iron  deficiency anemia (She had a prior history of multiple PEs and had seen Dr. Timmy previously)   IV iron : March 2023, October 2023, 07/07/2023, December 2024, March 2025, August 2025   Labs  02/06/2022: Ferritin 2.6, TIBC 669, there was no hemoglobin available to review. 02/17/2022: Hemoglobin 7.9, MCV 66, RDW 21.2, platelets 593 06/28/23: Hemoglobin: 10.6, Ferritin: 3.6 10/19/2023: Hemoglobin 11.1, MCV 85.8, B12 159, iron  saturation 3%, ferritin 6 02/02/2024: Hemoglobin 11.5, MCV 87.8, iron  saturation 8%, ferritin 9, B12 189 (ice chip cravings) 04/16/24: Hemoglobin 11.8, MCV 88.8, iron  saturation 25%, B12 240, ferritin 56 05/17/2024: Hemoglobin 12.1, B12 295, ferritin 24,  iron  saturation 12% 08/12/2024: Hemoglobin 12.3, MCV 90.5, iron  saturation 22%, ferritin 128   Cause: unclear. She has had heavy menstrual cycles back-to-back recently   unexplained bruising: Workup including platelet function assay: Normal, von Willebrand factor : Normal, factor VIII: 98% von Willebrand factor  antigen: 86%   Multiple problems including benign positional vertigo that comes and goes.  Treatment plan: No need of IV  iron  at this time. Recheck labs in 3 months and follow-up for telephone visit   Assessment & Plan Iron  deficiency anemia Iron  deficiency anemia not well-managed. Ferritin at 124, improved from undetectable in 2023. Hemoglobin and CBC normal. Recent iron  infusion did not alleviate symptoms, suggesting iron  deficiency may not be primary cause. - Follow-up in three months for lab work and phone consultation.  Abnormal uterine bleeding Recent abnormal uterine bleeding with continuous bleeding since last cycle.  Vertigo Intermittent vertigo with nausea. Symptoms not linked to iron  infusions. Meclizine  prescribed. Epley maneuver provided some relief. No clear etiology identified.  Hypertension Elevated blood pressure noted. - Recheck blood pressure.      No orders of the defined types were placed in this encounter.  The patient has a good understanding of the overall plan. she agrees with it. she will call with any problems that may develop before the next visit here. Total time spent: 30 mins including face to face time and time spent for planning, charting and co-ordination of care   Naomi MARLA Chad, MD 08/14/24

## 2024-08-14 NOTE — Assessment & Plan Note (Signed)
(  She had a prior history of multiple PEs and had seen Dr. Timmy previously)   IV iron : March 2023, October 2023, 07/07/2023, December 2024, March 2025,   Labs  02/06/2022: Ferritin 2.6, TIBC 669, there was no hemoglobin available to review. 02/17/2022: Hemoglobin 7.9, MCV 66, RDW 21.2, platelets 593 06/28/23: Hemoglobin: 10.6, Ferritin: 3.6 10/19/2023: Hemoglobin 11.1, MCV 85.8, B12 159, iron  saturation 3%, ferritin 6 02/02/2024: Hemoglobin 11.5, MCV 87.8, iron  saturation 8%, ferritin 9, B12 189 (ice chip cravings) 04/16/24: Hemoglobin 11.8, MCV 88.8, iron  saturation 25%, B12 240, ferritin 56 05/17/2024: Hemoglobin 12.1, B12 295, ferritin 24, iron  saturation 12% 08/12/2024: Hemoglobin 12.3, MCV 90.5, iron  saturation 22%, ferritin 128   Cause: unclear. Going to see GI    unexplained bruising: Workup including platelet function assay: Normal, von Willebrand factor : Normal, factor VIII: 98% von Willebrand factor  antigen: 86%   Treatment plan: No need of IV iron  at this time. Recheck labs in 3 months and follow-up for telephone visit

## 2024-08-15 ENCOUNTER — Telehealth: Payer: Self-pay | Admitting: Hematology and Oncology

## 2024-08-15 NOTE — Telephone Encounter (Signed)
 left vm for pt about scheduled appt dates and times

## 2024-08-23 NOTE — Patient Instructions (Signed)
 It was good to see you again today Recommend a COVID booster this fall at your pharmacy

## 2024-08-23 NOTE — Progress Notes (Signed)
 [facility] [address] [place], [place] [address] [phone]  Date:  08/26/2024   Name:  Paula Benson   DOB:  [birth year]   MRN:  [id number]  PCP:  Watt Harlene BROCKS, MD    Chief Complaint: Dizziness   History of Present Illness:  Paula Benson is a 38 y.o. very pleasant female patient who presents with the following:  Patient seen today with concern of vertigo- virtual visit today   Pt location is home, my location is office  Pt ID confirmed with 2 factors, she gives consent for a virtual visit today The patient and myself are present on the call today  Also history of menorrhagia, pulmonary embolism, migraine headaches, anxiety and depression versus bipolar disorder I saw her most recently for virtual visit last month She unfortunately has several health problems including chronic chronic migraine, iron  deficiency/malabsorption requiring iron  infusion    She is being treated by Dr. Gudena and receiving iron  infusions-most recent visit September 10  Mammogram- ordered  Flu shot- recommended   BP Readings from Last 3 Encounters:  08/14/24 129/80  07/13/24 120/73  07/05/24 128/81     Discussed the use of AI scribe software for clinical note transcription with the patient, who gave verbal consent to proceed.  History of Present Illness [place] Paula Benson is a 38 year old female who presents with worsening vertigo and concerns about a dental abscess.  She experiences worsening vertigo, described as a spinning sensation similar to intoxication, accompanied by nausea but no vomiting. Her mother has similar vertigo issues. She has not yet seen a neurologist but has an upcoming appointment with a physical therapist.  She is concerned about a dental abscess related to a cracked tooth that previously underwent a root canal. She reports no pain due to the absence of a nerve in the tooth. She has previously  taken amoxicillin  and penicillin  without issues.  She has been experiencing unusual menstrual cycles, with her period starting again shortly after ending, which is a new occurrence for her. Recent blood work showed an unexpected increase in iron  levels, higher than during her pregnancy, despite no recent infusions. She is taking a new iron  supplement prescribed to her.  She reports that her blood pressure was measured as high during recent visits, but she has no known history of hypertension. She does not have a blood pressure cuff at home. Her blood pressure was only high previously during a pre-eclamptic episode in pregnancy.  She mentions having multiple jobs and often requires notes for work absences. She feels discriminated against at work due to her health issues. She has been dealing with mold in her apartment since 2021, which she has reported multiple times without resolution. She is currently working with a Clinical research associate and awaiting a city investigation.   Patient Active Problem List   Diagnosis Date Noted   Port-A-Cath in place 03/28/2024   Menometrorrhagia 07/27/2016   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HCC) 12/31/2015   CAP (community acquired pneumonia) 11/02/2014   Pyelonephritis 10/31/2014   Hypokalemia 10/31/2014   Neck pain 02/05/2014   Migraine headache 08/14/2013   Palpitations 04/12/2012   History of aspiration pneumonitis 02/20/2012   Iron  deficiency anemia 02/20/2012   Recurrent urinary tract infection 08/25/2011   Genital herpes 06/09/2011   Panic disorder 03/21/2011   Chronic anticoagulation 03/17/2011   ASTHMA, INTERMITTENT 03/04/2010    Past Medical History:  Diagnosis Date  Anemia    Anxiety    Asthma    Bipolar II disorder (HCC)    Complication of anesthesia 2003   ASPIRATED DURING EMERGENCY CS   Depression    doing ok   Eclamptic seizure 2003   Fibroid    Genital herpes    Headache(784.0)    Herpes    History of blood clots    x3   History of blood  transfusion MARCH 2013   Menometrorrhagia 07/27/2016   Migraines    Personal history of PE (pulmonary embolism) x 3, latest 02/2010   noncompliant with INR checks, must limit refills   Preeclampsia 2003   with first pregnancy   Pregnancy induced hypertension 2003   seized 2wks after   Pyelonephritis 2012   Seizures (HCC) 2003   after first delivery   Suicidal ideations    Urinary tract infection     Past Surgical History:  Procedure Laterality Date   CESAREAN SECTION  12/2001 and 01/2008   CESAREAN SECTION  06/26/2012   Procedure: CESAREAN SECTION;  Surgeon: Elveria Mungo, MD;  Location: [facility];  Service: Obstetrics;  Laterality: N/A;   INDUCED ABORTION  01/2010   IR IMAGING GUIDED PORT INSERTION  11/17/2023    Social History   Tobacco Use   Smoking status: Never   Smokeless tobacco: Never  Vaping Use   Vaping status: Never Used  Substance Use Topics   Alcohol  use: No   Drug use: No    Family History  Problem Relation Age of Onset   Diabetes Mother        Type 2   Anemia Sister    Hypertension Sister    Breast cancer Maternal Aunt    Anesthesia problems Neg Hx     No Known Allergies  Medication list has been reviewed and updated.  Current Outpatient Medications on File Prior to Visit  Medication Sig Dispense Refill   acetaminophen  (TYLENOL ) 500 MG tablet Take 1,000 mg by mouth every 6 (six) hours as needed for mild pain, moderate pain or headache.     acyclovir  (ZOVIRAX ) 200 MG capsule TAKE 2 CAPSULES BY MOUTH 2 TIMES DAILY. USE FOR HSV SUPRESSION. IF OUTBREAK TAKE 800 MG THREE TIMES A DAY FOR 2 DAYS. 60 capsule 1   albuterol  (VENTOLIN  HFA) 108 (90 Base) MCG/ACT inhaler Inhale 2 puffs into the lungs every 6 (six) hours as needed for wheezing or shortness of breath. 1 each 6   ALPRAZolam  (XANAX ) 0.25 MG tablet Take 1 tablet (0.25 mg total) by mouth 3 (three) times daily as needed for anxiety. 20 tablet 0   ARIPiprazole (ABILIFY) 15 MG tablet Take 1 tablet  (15 mg total) by mouth daily. 30 tablet 0   busPIRone (BUSPAR) 10 MG tablet Take 1 tablet (10 mg total) by mouth 2 (two) times daily. 60 tablet 0   cetirizine  (ZYRTEC  ALLERGY) 10 MG tablet Take 1 tablet (10 mg total) by mouth daily for 14 days. 14 tablet 0   doxycycline  (VIBRAMYCIN ) 100 MG capsule Take 1 capsule (100 mg total) by mouth 2 (two) times daily. 20 capsule 0   Fluocinolone  Acetonide Scalp 0.01 % OIL Apply to scalp daily as needed, wash out 8- 12 hours later 118 mL 1   fluticasone  (FLONASE ) 50 MCG/ACT nasal spray Place 2 sprays into both nostrils daily. 16 g 6   lidocaine -prilocaine  (EMLA ) cream APPLY TOPICALLY AS NEEDED 30 g 0   lurasidone (LATUDA) 40 MG TABS tablet Take 40 mg by mouth  daily. (Patient not taking: Reported on 08/26/2024)     meclizine  (ANTIVERT ) 25 MG tablet Take 1 tablet (25 mg total) by mouth 3 (three) times daily as needed for dizziness. 30 tablet 0   ondansetron  (ZOFRAN ) 4 MG tablet Take 1 tablet (4 mg total) by mouth every 8 (eight) hours as needed for nausea or vomiting. 20 tablet 0   phentermine  15 MG capsule Take 1 capsule (15 mg total) by mouth every morning. (Patient not taking: Reported on 08/26/2024) 30 capsule 0   phentermine  37.5 MG capsule Take 1 capsule (37.5 mg total) by mouth every morning. 30 capsule 1   Rimegepant Sulfate (NURTEC) 75 MG TBDP TAKE 1 TABLET EVERY OTHER DAY FOR HEADACHE PREVENTION 15 tablet 2   tiZANidine  (ZANAFLEX ) 4 MG tablet Take 1 tablet (4 mg total) by mouth every 8 (eight) hours as needed for muscle spasms. 30 tablet 6   triamcinolone  ointment (KENALOG ) 0.5 % APPLY TO AFFECTED AREAS 2 TIMES A DAY AS NEEDED *DO NOT USE ON FACE* 60 g 1   Vitamin D, Ergocalciferol, (DRISDOL) 1.25 MG (50000 UNIT) CAPS capsule Take 50,000 Units by mouth once a week.     No current facility-administered medications on file prior to visit.    Review of Systems:  As per HPI- otherwise negative.   Physical Examination: There were no vitals filed for  this visit. There were no vitals filed for this visit. There is no height or weight on file to calculate BMI. Ideal Body Weight:    Patient viewed on MyChart video, she looks well and her normal self.  No shortness of breath or distress is noted  Assessment and Plan: Dental infection - Plan: penicillin  v potassium (VEETID) 500 MG tablet  Vertigo - Plan: Ambulatory referral to Neurology, CANCELED: Ambulatory referral to Neurology  Encounter for screening mammogram for malignant neoplasm of breast - Plan: MM 3D SCREENING MAMMOGRAM BILATERAL BREAST  Assessment & Plan Dental abscess Suspected dental abscess due to cracked tooth with history of root canal. Bump on gum suggests infection. No pain due to absence of nerve. Discussed risk of systemic infection. - Prescribed penicillin , sent prescription to [facility] pharmacy. - Advised monitoring for systemic infection symptoms and seeking emergency care if she occurs. - Recommended dental appointment for further evaluation and treatment.  Vertigo Chronic vertigo with symptoms similar to previous episodes. No prior neurology consultation. Discussed potential environmental factors, though unlikely main cause. - Referred to neurologist for further evaluation. - Continue with scheduled physical therapy appointment on the second.  Signed Harlene Schroeder, MD

## 2024-08-26 ENCOUNTER — Telehealth: Admitting: Family Medicine

## 2024-08-26 DIAGNOSIS — Z1231 Encounter for screening mammogram for malignant neoplasm of breast: Secondary | ICD-10-CM

## 2024-08-26 DIAGNOSIS — R42 Dizziness and giddiness: Secondary | ICD-10-CM

## 2024-08-26 DIAGNOSIS — K047 Periapical abscess without sinus: Secondary | ICD-10-CM

## 2024-08-26 MED ORDER — PENICILLIN V POTASSIUM 500 MG PO TABS: 500.0000 mg | ORAL_TABLET | Freq: Three times a day (TID) | ORAL | 0 refills | Status: DC

## 2024-08-27 ENCOUNTER — Encounter: Payer: Self-pay | Admitting: Hematology and Oncology

## 2024-08-30 ENCOUNTER — Telehealth: Payer: Self-pay

## 2024-08-30 ENCOUNTER — Other Ambulatory Visit (HOSPITAL_COMMUNITY): Payer: Self-pay

## 2024-08-30 NOTE — Telephone Encounter (Signed)
 Pharmacy Patient Advocate Encounter   Received notification from CoverMyMeds that prior authorization for Nurtec 75MG  dispersible tablets  is required/requested.   Insurance verification completed.   The patient is insured through [facility] .   Per test claim: PA required; PA submitted to above mentioned insurance via Latent Key/confirmation #/EOC [id number] Status is pending

## 2024-09-02 ENCOUNTER — Encounter: Payer: Self-pay | Admitting: Family Medicine

## 2024-09-02 NOTE — Telephone Encounter (Signed)
 Pharmacy Patient Advocate Encounter  Received notification from [facility] that Prior Authorization for Nurtec 75MG  dispersible tablets  has been APPROVED from 08/31/2024 to 02/27/2025   PA #/Case ID/Reference #: [id number]  PLEASE BE ADVISED Request Reference Number: [id number]. NURTEC TAB 75MG  ODT is approved through 02/27/2025. Your patient may now fill this prescription and it will be covered.

## 2024-09-03 NOTE — Progress Notes (Unsigned)
 [facility] [address] [place], [place] [address] [phone]  Date:  09/05/2024   Name:  Paula Benson   DOB:  05-08-1986   MRN:  [id number]  PCP:  Watt Harlene BROCKS, MD    Chief Complaint: No chief complaint on file.   History of Present Illness:  Paula Benson is a 38 y.o. very pleasant female patient who presents with the following:  Patient seen today for virtual visit to discuss FMLA paperwork She has a history of several chronic health problems including menorrhagia, pulmonary embolism, migraine headaches, anxiety and depression versus bipolar disorder  Patient location is home, location is office.  Patient identity confirmed with 2 factors, she gives consent for virtual visit today.  The patient and myself are present on the call today  Discussed the use of AI scribe software for clinical note transcription with the patient, who gave verbal consent to proceed.  History of Present Illness     Patient Active Problem List   Diagnosis Date Noted   Port-A-Cath in place 03/28/2024   Menometrorrhagia 07/27/2016   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HCC) 12/31/2015   CAP (community acquired pneumonia) 11/02/2014   Pyelonephritis 10/31/2014   Hypokalemia 10/31/2014   Neck pain 02/05/2014   Migraine headache 08/14/2013   Palpitations 04/12/2012   History of aspiration pneumonitis 02/20/2012   Iron  deficiency anemia 02/20/2012   Recurrent urinary tract infection 08/25/2011   Genital herpes 06/09/2011   Panic disorder 03/21/2011   Chronic anticoagulation 03/17/2011   ASTHMA, INTERMITTENT 03/04/2010    Past Medical History:  Diagnosis Date   Anemia    Anxiety    Asthma    Bipolar II disorder (HCC)    Complication of anesthesia 2003   ASPIRATED DURING EMERGENCY CS   Depression    doing ok   Eclamptic seizure 2003   Fibroid    Genital herpes    Headache(784.0)    Herpes    History of blood  clots    x3   History of blood transfusion MARCH 2013   Menometrorrhagia 07/27/2016   Migraines    Personal history of PE (pulmonary embolism) x 3, latest 02/2010   noncompliant with INR checks, must limit refills   Preeclampsia 2003   with first pregnancy   Pregnancy induced hypertension 2003   seized 2wks after   Pyelonephritis 2012   Seizures (HCC) 2003   after first delivery   Suicidal ideations    Urinary tract infection     Past Surgical History:  Procedure Laterality Date   CESAREAN SECTION  12/2001 and 01/2008   CESAREAN SECTION  06/26/2012   Procedure: CESAREAN SECTION;  Surgeon: Elveria Mungo, MD;  Location: [facility];  Service: Obstetrics;  Laterality: N/A;   INDUCED ABORTION  01/2010   IR IMAGING GUIDED PORT INSERTION  11/17/2023    Social History   Tobacco Use   Smoking status: Never   Smokeless tobacco: Never  Vaping Use   Vaping status: Never Used  Substance Use Topics   Alcohol  use: No   Drug use: No    Family History  Problem Relation Age of Onset   Diabetes Mother        Type 2   Anemia Sister    Hypertension Sister    Breast cancer Maternal Aunt    Anesthesia problems Neg Hx     No Known Allergies  Medication list has been reviewed and updated.  Current Outpatient Medications on File Prior to Visit  Medication Sig Dispense Refill   acetaminophen  (TYLENOL ) 500 MG tablet Take 1,000 mg by mouth every 6 (six) hours as needed for mild pain, moderate pain or headache.     acyclovir  (ZOVIRAX ) 200 MG capsule TAKE 2 CAPSULES BY MOUTH 2 TIMES DAILY. USE FOR HSV SUPRESSION. IF OUTBREAK TAKE 800 MG THREE TIMES A DAY FOR 2 DAYS. 60 capsule 1   albuterol  (VENTOLIN  HFA) 108 (90 Base) MCG/ACT inhaler Inhale 2 puffs into the lungs every 6 (six) hours as needed for wheezing or shortness of breath. 1 each 6   ALPRAZolam  (XANAX ) 0.25 MG tablet Take 1 tablet (0.25 mg total) by mouth 3 (three) times daily as needed for anxiety. 20 tablet 0   ARIPiprazole  (ABILIFY) 15 MG tablet Take 1 tablet (15 mg total) by mouth daily. 30 tablet 0   busPIRone (BUSPAR) 10 MG tablet Take 1 tablet (10 mg total) by mouth 2 (two) times daily. 60 tablet 0   cetirizine  (ZYRTEC  ALLERGY) 10 MG tablet Take 1 tablet (10 mg total) by mouth daily for 14 days. 14 tablet 0   doxycycline  (VIBRAMYCIN ) 100 MG capsule Take 1 capsule (100 mg total) by mouth 2 (two) times daily. 20 capsule 0   Fluocinolone  Acetonide Scalp 0.01 % OIL Apply to scalp daily as needed, wash out 8- 12 hours later 118 mL 1   fluticasone  (FLONASE ) 50 MCG/ACT nasal spray Place 2 sprays into both nostrils daily. 16 g 6   lidocaine -prilocaine  (EMLA ) cream APPLY TOPICALLY AS NEEDED 30 g 0   lurasidone (LATUDA) 40 MG TABS tablet Take 40 mg by mouth daily. (Patient not taking: Reported on 08/26/2024)     meclizine  (ANTIVERT ) 25 MG tablet Take 1 tablet (25 mg total) by mouth 3 (three) times daily as needed for dizziness. 30 tablet 0   ondansetron  (ZOFRAN ) 4 MG tablet Take 1 tablet (4 mg total) by mouth every 8 (eight) hours as needed for nausea or vomiting. 20 tablet 0   penicillin  v potassium (VEETID) 500 MG tablet Take 1 tablet (500 mg total) by mouth 3 (three) times daily. 30 tablet 0   phentermine  15 MG capsule Take 1 capsule (15 mg total) by mouth every morning. (Patient not taking: Reported on 08/26/2024) 30 capsule 0   phentermine  37.5 MG capsule Take 1 capsule (37.5 mg total) by mouth every morning. 30 capsule 1   Rimegepant Sulfate (NURTEC) 75 MG TBDP TAKE 1 TABLET EVERY OTHER DAY FOR HEADACHE PREVENTION 15 tablet 2   tiZANidine  (ZANAFLEX ) 4 MG tablet Take 1 tablet (4 mg total) by mouth every 8 (eight) hours as needed for muscle spasms. 30 tablet 6   triamcinolone  ointment (KENALOG ) 0.5 % APPLY TO AFFECTED AREAS 2 TIMES A DAY AS NEEDED *DO NOT USE ON FACE* 60 g 1   Vitamin D, Ergocalciferol, (DRISDOL) 1.25 MG (50000 UNIT) CAPS capsule Take 50,000 Units by mouth once a week.     No current  facility-administered medications on file prior to visit.    Review of Systems:  As per HPI- otherwise negative.   Physical Examination: There were no vitals filed for this visit. There were no vitals filed for this visit. There is no height or weight on file to calculate BMI. Ideal Body Weight:    ***  Assessment and Plan: No diagnosis found.  Assessment & Plan   Signed Harlene Schroeder, MD

## 2024-09-04 ENCOUNTER — Ambulatory Visit: Admitting: Family Medicine

## 2024-09-05 ENCOUNTER — Ambulatory Visit (INDEPENDENT_AMBULATORY_CARE_PROVIDER_SITE_OTHER): Admitting: Family Medicine

## 2024-09-05 ENCOUNTER — Other Ambulatory Visit: Payer: Self-pay

## 2024-09-05 ENCOUNTER — Encounter: Payer: Self-pay | Admitting: Rehabilitation

## 2024-09-05 ENCOUNTER — Encounter: Payer: Self-pay | Admitting: Family Medicine

## 2024-09-05 ENCOUNTER — Ambulatory Visit: Attending: Family Medicine | Admitting: Rehabilitation

## 2024-09-05 VITALS — BP 136/86 | HR 99 | Ht 63.0 in | Wt 186.6 lb

## 2024-09-05 DIAGNOSIS — R42 Dizziness and giddiness: Secondary | ICD-10-CM

## 2024-09-05 DIAGNOSIS — R262 Difficulty in walking, not elsewhere classified: Secondary | ICD-10-CM | POA: Insufficient documentation

## 2024-09-05 DIAGNOSIS — G43009 Migraine without aura, not intractable, without status migrainosus: Secondary | ICD-10-CM | POA: Insufficient documentation

## 2024-09-05 DIAGNOSIS — D509 Iron deficiency anemia, unspecified: Secondary | ICD-10-CM

## 2024-09-05 DIAGNOSIS — H8111 Benign paroxysmal vertigo, right ear: Secondary | ICD-10-CM | POA: Insufficient documentation

## 2024-09-05 NOTE — Therapy (Signed)
 OUTPATIENT PHYSICAL THERAPY VESTIBULAR EVALUATION     Patient Name: Paula Benson MRN: [id number] DOB:1986/04/24, 38 y.o., female Today's Date: 09/05/2024  END OF SESSION:  PT End of Session - 09/05/24 0851     Visit Number 1    Date for Recertification  10/31/24    PT Start Time 0848    PT Stop Time 0930    PT Time Calculation (min) 42 min    Activity Tolerance Patient tolerated treatment well;No increased pain    Behavior During Therapy WFL for tasks assessed/performed          Past Medical History:  Diagnosis Date   Anemia    Anxiety    Asthma    Bipolar II disorder (HCC)    Complication of anesthesia 2003   ASPIRATED DURING EMERGENCY CS   Depression    doing ok   Eclamptic seizure 2003   Fibroid    Genital herpes    Headache(784.0)    Herpes    History of blood clots    x3   History of blood transfusion MARCH 2013   Menometrorrhagia 07/27/2016   Migraines    Personal history of PE (pulmonary embolism) x 3, latest 02/2010   noncompliant with INR checks, must limit refills   Preeclampsia 2003   with first pregnancy   Pregnancy induced hypertension 2003   seized 2wks after   Pyelonephritis 2012   Seizures (HCC) 2003   after first delivery   Suicidal ideations    Urinary tract infection    Past Surgical History:  Procedure Laterality Date   CESAREAN SECTION  12/2001 and 01/2008   CESAREAN SECTION  06/26/2012   Procedure: CESAREAN SECTION;  Surgeon: Elveria Mungo, MD;  Location: [facility];  Service: Obstetrics;  Laterality: N/A;   INDUCED ABORTION  01/2010   IR IMAGING GUIDED PORT INSERTION  11/17/2023   Patient Active Problem List   Diagnosis Date Noted   Port-A-Cath in place 03/28/2024   Menometrorrhagia 07/27/2016   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HCC) 12/31/2015   CAP (community acquired pneumonia) 11/02/2014   Pyelonephritis 10/31/2014   Hypokalemia 10/31/2014   Neck pain 02/05/2014   Migraine headache 08/14/2013   Palpitations 04/12/2012    History of aspiration pneumonitis 02/20/2012   Iron  deficiency anemia 02/20/2012   Recurrent urinary tract infection 08/25/2011   Genital herpes 06/09/2011   Panic disorder 03/21/2011   Chronic anticoagulation 03/17/2011   ASTHMA, INTERMITTENT 03/04/2010    PCP: Watt Raisin, MD  REFERRING PROVIDER: Watt Raisin, MD  REFERRING DIAG: R42 (ICD-10-CM) - Vertigo  THERAPY DIAG:  Migraine without aura and without status migrainosus, not intractable - Plan: PT plan of care cert/re-cert  BPPV (benign paroxysmal positional vertigo), right - Plan: PT plan of care cert/re-cert  Difficulty in walking, not elsewhere classified - Plan: PT plan of care cert/re-cert  ONSET DATE: beginning of September 2025  Rationale for Evaluation and Treatment: Rehabilitation  SUBJECTIVE:   SUBJECTIVE STATEMENT: 38 y/o patient referred to PT from Dr Watt for vertigo.   Started having symptoms a month ago for no apparent reason.   Initially the symptoms were more constant, but now more intermittent per her report.  She enters the clinic ambulating with her head positioned in maximal R cervical sidebending.   She states this relieves her symptoms.   When the symptoms occur, She has to immediately lie down to prevent worsening.  States symptoms feel like the room is spinning, like being very drunk.  Symptoms can last 1/2  a day.  Looking up or head turns provoke dizziness.  Sitting up from supine is also very provoking.  She reports [place], but no vomiting.   She Denies any falls, but reports she has nearly fallen.   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  Pt accompanied by: self  PERTINENT HISTORY: bipolar, migraines, h/o PE, asthma, anxiety, suicidal ideations  PAIN:  Are you having pain? No  PRECAUTIONS: Fall precautions due to vertigo symptoms  RED FLAGS: None   WEIGHT BEARING RESTRICTIONS: No  FALLS: Has patient fallen in last  6 months? No  LIVING ENVIRONMENT: Lives with: lives with their family Lives in: House/apartment Stairs: unknown Has following equipment at home: None  PLOF: Independent with gait  PATIENT GOALS: stop being dizzy  OBJECTIVE:  Note: Objective measures were completed at Evaluation unless otherwise noted.  DIAGNOSTIC FINDINGS:   COGNITION: Overall cognitive status: Within functional limits for tasks assessed   SENSATION: WFL  EDEMA:  N/a  MUSCLE TONE:  WNL  DTRs:  NT  POSTURE:  forward head, initially keeps her head in R sidebending, but has treatment progresses she moves into L sidbending, flexion but never extends her head into neutral or extension  Cervical ROM:    Active A/PROM (deg) eval  Flexion 100%  Extension On request unable due to dizziness, but with Hallpike she has at least 30-40 degrees extension  Right lateral flexion 100%   Left lateral flexion 100%  Right rotation 70% observed  Left rotation 70% observed  (Blank rows = not tested)  STRENGTH: NT  BED MOBILITY:  Independent but difficult due to dizziness  TRANSFERS: Assistive device utilized: None  Sit to stand: Complete Independence Stand to sit: Complete Independence Chair to chair: Complete Independence Floor: NT   GAIT: Gait pattern: walks into clinic with full R sidebending position of her cervical spine;   leaves clinic with neck in L sidebending Distance walked: into clinic 200' Assistive device utilized: None Level of assistance: Complete Independence Comments:   FUNCTIONAL TESTS:  TBD  PATIENT SURVEYS:  DHI: THE DIZZINESS HANDICAP INVENTORY (DHI)  P1. Does looking up increase your problem? 4 = Yes  E2. Because of your problem, do you feel frustrated? 4 = Yes  F3. Because of your problem, do you restrict your travel for business or recreation?  4 = Yes  P4. Does walking down the aisle of a supermarket increase your problems?  4 = Yes  F5. Because of your problem, do you  have difficulty getting into or out of bed?  4 = Yes  F6. Does your problem significantly restrict your participation in social activities, such as going out to dinner, going to the movies, dancing, or going to parties? 4 = Yes  F7. Because of your problem, do you have difficulty reading?  2 = Sometimes  P8. Does performing more ambitious activities such as sports, dancing, household chores (sweeping or putting dishes away) increase your problems?  4 = Yes  E9. Because of your problem, are you afraid to leave your home without having without having someone accompany you?  4 = Yes  E10. Because of your problem have you been embarrassed in front of others?  2 = Sometimes  P11. Do quick movements of your head increase your problem?  4 = Yes  F12. Because of your problem, do you avoid heights?  4 = Yes  P13. Does turning over in bed  increase your problem?  2 = Sometimes  F14. Because of your problem, is it difficult for you to do strenuous homework or yard work? 4 = Yes  E15. Because of your problem, are you afraid people may think you are intoxicated? 4 = Yes  F16. Because of your problem, is it difficult for you to go for a walk by yourself?  4 = Yes  P17. Does walking down a sidewalk increase your problem?  4 = Yes  E18.Because of your problem, is it difficult for you to concentrate 4 = Yes  F19. Because of your problem, is it difficult for you to walk around your house in the dark? 2 = Sometimes  E20. Because of your problem, are you afraid to stay home alone?  2 = Sometimes  E21. Because of your problem, do you feel handicapped? 2 = Sometimes  E22. Has the problem placed stress on your relationships with members of your family or friends? 4 = Yes  E23. Because of your problem, are you depressed?  4 = Yes  F24. Does your problem interfere with your job or household responsibilities?  4 = Yes  P25. Does bending over increase your problem?  4 = Yes  TOTAL 88    DHI Scoring Instructions  The  patient is asked to answer each question as it pertains to dizziness or unsteadiness problems, specifically  considering their condition during the last month. Questions are designed to incorporate functional (F), physical  (P), and emotional (E) impacts on disability.   Scores greater than 10 points should be referred to balance specialists for further evaluation.   16-34 Points (mild handicap)  36-52 Points (moderate handicap)  54+ Points (severe handicap)  Minimally Detectable Change: 17 points ([address] [place], 1990)  Goldenrod, G. SHAUNNA. and Atwood, C. W. (1990). The development of the Dizziness Handicap Inventory. Archives of Otolaryngology - Head and Neck Surgery 116(4): [id number].   VESTIBULAR ASSESSMENT:  GENERAL OBSERVATION: keeps eyes closed or slightly open, wears glasses   SYMPTOM BEHAVIOR:  Subjective history: per initial note above  Non-Vestibular symptoms: neck pain, headaches, nausea/vomiting, and migraine symptoms  Type of dizziness: Spinning/Vertigo and Unsteady with head/body turns  Frequency: multiple times daily, variable lengths of time  Duration: variable, up to half a day per her report  Aggravating factors: Induced by position change: supine to sit, Induced by motion: occur when walking, looking up at the ceiling, turning body quickly, turning head quickly, and driving, and neck extension  Relieving factors: head stationary and lying supine  Progression of symptoms: unchanged  OCULOMOTOR EXAM:  Ocular Alignment: normal  Ocular ROM: No Limitations  Spontaneous Nystagmus: absent  Gaze-Induced Nystagmus: unable to tell; patient can't keep eyes open with testing  Smooth Pursuits: nable to tell; patient can't keep eyes open with testing  Saccades: unable to tell; patient can't keep eyes open  Convergence/Divergence: ? cm    VESTIBULAR - OCULAR REFLEX:   Can't really test for these due to patient unable to keep eyes open   POSITIONAL TESTING: Right  Dix-Hallpike: nystagmus is present, and I can see it, but patient doesn't leave eyes open long enough for true assessment Left Dix-Hallpike: seems to be negative as much as I can tell, patient also does not c/o symptoms in this position  MOTION SENSITIVITY:  Motion Sensitivity Quotient Intensity: 0 = none, 1 = Lightheaded, 2 = Mild, 3 = Moderate, 4 = Severe, 5 = Vomiting  Intensity  1. Sitting to supine  2. Supine to L side   3. Supine to R side   4. Supine to sitting   5. L Hallpike-Dix   6. Up from L    7. R Hallpike-Dix   8. Up from R    9. Sitting, head tipped to L knee   10. Head up from L knee   11. Sitting, head tipped to R knee   12. Head up from R knee   13. Sitting head turns x5   14.Sitting head nods x5   15. In stance, 180 turn to L    16. In stance, 180 turn to R     OTHOSTATICS: NT  FUNCTIONAL GAIT: NT                                                                                                                             TREATMENT DATE:  09/04/24 Canalith Repositioning:  [place] Right: Number of Reps: 2  PATIENT EDUCATION: Education details: [employer] APP Person educated: Patient Education method: Explanation, Demonstration, Tactile cues, Verbal cues, and Handouts Education comprehension: verbalized understanding, verbal cues required, tactile cues required, and needs further education  HOME EXERCISE PROGRAM:  GOALS: Goals reviewed with patient? Yes  SHORT TERM GOALS: Target date: 10/03/2024   Patient will report 50-75% subjective improvement in her dizziness symptoms Baseline: severe Goal status: INITIAL  2.  Patient will be able to keep her eyes open throughout a therapy session without c/o dizziness with a more full vestibular assessment Baseline: eyes are closed approximately 75% of the time Goal status: INITIAL  3.  DHI score will improve to </= 50 points Baseline: 88 Goal status: INITIAL  4.  Patient will be able to ambulate with  normal head position and normal posture  Baseline: keeps head in various sidebent positions ambulating in clinic today Goal status: INITIAL   LONG TERM GOALS: Target date: 10/31/2024   Patient will report complete resolution of vertigo symptoms Baseline:  Goal status: INITIAL  2.  Patient will be able to move her cervical spine through full ROM without inhibition, keeping eyes open in standing positions Baseline: unable to extend her neck  Goal status: INITIAL  ASSESSMENT:  CLINICAL IMPRESSION: Patient was seen today for physical therapy evaluation and treatment for vertigo.  Evaluation is very limited due to the patient having difficulty keeping her eyes open with positional vertigo testing and with all testing for that matter.   However, she does have nystagmus and dizziness  with R hallpike dix test.  She is treated with canalith repositioning technique for R BPPV x 2.   She is also educated in performing Gufoni maneuver to the R  followed by BBQ roll for R BPPV to be done at home over the weekend.  She needs to be evaluated more fully with cervical ROM, gaze stabilization, VOR testing so hopefully we can do that in the future.   Her symptoms are too severe today to do that.  PT is necessary for deficits in dizziness, cervical posture, having to keep her eyes closed to decrease her symptoms, and lack of knowledge of how to manage symptoms at home and with home exercises.   Nicolina is agreeable to PT treatment plan of care and would benefit from our service due to address the above issues.    OBJECTIVE IMPAIRMENTS: difficulty walking, decreased ROM, dizziness, and postural dysfunction.   ACTIVITY LIMITATIONS: lifting, bending, sitting, and locomotion level  PARTICIPATION LIMITATIONS: meal prep, cleaning, laundry, driving, shopping, community activity, and occupation  PERSONAL FACTORS: Behavior pattern, Time since onset of injury/illness/exacerbation, and 1-2 comorbidities: bipolar,  migraines, h/o PE, asthma, anxiety, suicidal ideations are also affecting patient's functional outcome.   REHAB POTENTIAL: Good  CLINICAL DECISION MAKING: Evolving/moderate complexity  EVALUATION COMPLEXITY: Moderate   PLAN:  PT FREQUENCY: 1-2x/week  PT DURATION: 8 weeks  PLANNED INTERVENTIONS: 97164- PT Re-evaluation, 97750- Physical Performance Testing, 97110-Therapeutic exercises, 97530- Therapeutic activity, 97112- Neuromuscular re-education, 97535- Self Care, [id number]- Manual therapy, [phone]- Canalith repositioning, Patient/Family education, Balance training, Joint mobilization, and Spinal mobilization  PLAN FOR NEXT SESSION: reassess BPPV, reassess VOR, pursuits, saccades.  Repeat Hallpike dix and Anterior canal testing with treatment as necessary   Jaliya Siegmann, PT 09/05/2024, 9:11 PM

## 2024-09-09 ENCOUNTER — Ambulatory Visit: Admitting: Rehabilitation

## 2024-09-11 ENCOUNTER — Ambulatory Visit: Admitting: Rehabilitation

## 2024-09-13 ENCOUNTER — Ambulatory Visit

## 2024-09-17 ENCOUNTER — Ambulatory Visit

## 2024-09-19 ENCOUNTER — Encounter: Payer: Self-pay | Admitting: Hematology and Oncology

## 2024-09-24 ENCOUNTER — Encounter: Payer: Self-pay | Admitting: Rehabilitation

## 2024-09-24 ENCOUNTER — Ambulatory Visit: Admitting: Rehabilitation

## 2024-09-24 DIAGNOSIS — G43009 Migraine without aura, not intractable, without status migrainosus: Secondary | ICD-10-CM | POA: Diagnosis not present

## 2024-09-24 DIAGNOSIS — H8111 Benign paroxysmal vertigo, right ear: Secondary | ICD-10-CM

## 2024-09-24 DIAGNOSIS — R262 Difficulty in walking, not elsewhere classified: Secondary | ICD-10-CM

## 2024-09-24 NOTE — Therapy (Addendum)
 " OUTPATIENT PHYSICAL THERAPY VESTIBULAR TREATMENT / DC SUMMARY     Patient Name: Paula Benson MRN: [id number] DOB:1986/07/09, 38 y.o., female Today's Date: 09/24/2024  END OF SESSION:  PT End of Session - 09/24/24 1619     Visit Number 2    Date for Recertification  10/31/24    PT Start Time 1617    PT Stop Time 1700    PT Time Calculation (min) 43 min    Activity Tolerance Patient tolerated treatment well;No increased pain    Behavior During Therapy WFL for tasks assessed/performed          Past Medical History:  Diagnosis Date   Anemia    Anxiety    Asthma    Bipolar II disorder (HCC)    Complication of anesthesia 2003   ASPIRATED DURING EMERGENCY CS   Depression    doing ok   Eclamptic seizure 2003   Fibroid    Genital herpes    Headache(784.0)    Herpes    History of blood clots    x3   History of blood transfusion MARCH 2013   Menometrorrhagia 07/27/2016   Migraines    Personal history of PE (pulmonary embolism) x 3, latest 02/2010   noncompliant with INR checks, must limit refills   Preeclampsia 2003   with first pregnancy   Pregnancy induced hypertension 2003   seized 2wks after   Pyelonephritis 2012   Seizures (HCC) 2003   after first delivery   Suicidal ideations    Urinary tract infection    Past Surgical History:  Procedure Laterality Date   CESAREAN SECTION  12/2001 and 01/2008   CESAREAN SECTION  06/26/2012   Procedure: CESAREAN SECTION;  Surgeon: Elveria Mungo, MD;  Location: [facility];  Service: Obstetrics;  Laterality: N/A;   INDUCED ABORTION  01/2010   IR IMAGING GUIDED PORT INSERTION  11/17/2023   Patient Active Problem List   Diagnosis Date Noted   Port-A-Cath in place 03/28/2024   Menometrorrhagia 07/27/2016   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HCC) 12/31/2015   CAP (community acquired pneumonia) 11/02/2014   Pyelonephritis 10/31/2014   Hypokalemia 10/31/2014   Neck pain 02/05/2014   Migraine headache 08/14/2013    Palpitations 04/12/2012   History of aspiration pneumonitis 02/20/2012   Iron  deficiency anemia 02/20/2012   Recurrent urinary tract infection 08/25/2011   Genital herpes 06/09/2011   Panic disorder 03/21/2011   Chronic anticoagulation 03/17/2011   ASTHMA, INTERMITTENT 03/04/2010    PCP: Watt Raisin, MD  REFERRING PROVIDER: Watt Raisin, MD  REFERRING DIAG: R42 (ICD-10-CM) - Vertigo  THERAPY DIAG:  Migraine without aura and without status migrainosus, not intractable  BPPV (benign paroxysmal positional vertigo), right  Difficulty in walking, not elsewhere classified  ONSET DATE: beginning of September 2025  Rationale for Evaluation and Treatment: Rehabilitation  SUBJECTIVE:   SUBJECTIVE STATEMENT: Patient reports she feels about the same.   States she has had an episode of N/V.   States has had 1 falls since last visit to PT.  States having frequent episodes of dizziness.  EVAL:  38 y/o patient referred to PT from Dr Watt for vertigo.   Started having symptoms a month ago for no apparent reason.   Initially the symptoms were more constant, but now more intermittent per her report.  She enters the clinic ambulating with her head positioned in maximal R cervical sidebending.   She states this relieves her symptoms.   When the symptoms occur, She has to immediately lie  down to prevent worsening.  States symptoms feel like the room is spinning, like being very drunk.  Symptoms can last 1/2 a day.  Looking up or head turns provoke dizziness.  Sitting up from supine is also very provoking.  She reports [place], but no vomiting.   She Denies any falls, but reports she has nearly fallen.   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  Pt accompanied by: self  PERTINENT HISTORY: bipolar, migraines, h/o PE, asthma, anxiety, suicidal ideations  PAIN:  Are you having pain? No  PRECAUTIONS: Fall precautions due to vertigo  symptoms  RED FLAGS: None   WEIGHT BEARING RESTRICTIONS: No  FALLS: Has patient fallen in last 6 months? No  LIVING ENVIRONMENT: Lives with: lives with their family Lives in: House/apartment Stairs: unknown Has following equipment at home: None  PLOF: Independent with gait  PATIENT GOALS: stop being dizzy  OBJECTIVE:  Note: Objective measures were completed at Evaluation unless otherwise noted.  DIAGNOSTIC FINDINGS:   COGNITION: Overall cognitive status: Within functional limits for tasks assessed   SENSATION: WFL  EDEMA:  N/a  MUSCLE TONE:  WNL  DTRs:  NT  POSTURE:  forward head, initially keeps her head in R sidebending, but has treatment progresses she moves into L sidbending, flexion but never extends her head into neutral or extension  Cervical ROM:    Active A/PROM (deg) eval  Flexion 100%  Extension On request unable due to dizziness, but with Hallpike she has at least 30-40 degrees extension  Right lateral flexion 100%   Left lateral flexion 100%  Right rotation 70% observed  Left rotation 70% observed  (Blank rows = not tested)  STRENGTH: NT  BED MOBILITY:  Independent but difficult due to dizziness  TRANSFERS: Assistive device utilized: None  Sit to stand: Complete Independence Stand to sit: Complete Independence Chair to chair: Complete Independence Floor: NT   GAIT: Gait pattern: walks into clinic with full R sidebending position of her cervical spine;   leaves clinic with neck in L sidebending Distance walked: into clinic 200' Assistive device utilized: None Level of assistance: Complete Independence Comments:   FUNCTIONAL TESTS:  TBD  PATIENT SURVEYS:  DHI: THE DIZZINESS HANDICAP INVENTORY (DHI)  P1. Does looking up increase your problem? 4 = Yes  E2. Because of your problem, do you feel frustrated? 4 = Yes  F3. Because of your problem, do you restrict your travel for business or recreation?  4 = Yes  P4. Does walking  down the aisle of a supermarket increase your problems?  4 = Yes  F5. Because of your problem, do you have difficulty getting into or out of bed?  4 = Yes  F6. Does your problem significantly restrict your participation in social activities, such as going out to dinner, going to the movies, dancing, or going to parties? 4 = Yes  F7. Because of your problem, do you have difficulty reading?  2 = Sometimes  P8. Does performing more ambitious activities such as sports, dancing, household chores (sweeping or putting dishes away) increase your problems?  4 = Yes  E9. Because of your problem, are you afraid to leave your home without having without having someone accompany you?  4 = Yes  E10. Because of your problem have you been embarrassed in front of others?  2 = Sometimes  P11. Do quick movements of your head increase your problem?  4 =  Yes  F12. Because of your problem, do you avoid heights?  4 = Yes  P13. Does turning over in bed increase your problem?  2 = Sometimes  F14. Because of your problem, is it difficult for you to do strenuous homework or yard work? 4 = Yes  E15. Because of your problem, are you afraid people may think you are intoxicated? 4 = Yes  F16. Because of your problem, is it difficult for you to go for a walk by yourself?  4 = Yes  P17. Does walking down a sidewalk increase your problem?  4 = Yes  E18.Because of your problem, is it difficult for you to concentrate 4 = Yes  F19. Because of your problem, is it difficult for you to walk around your house in the dark? 2 = Sometimes  E20. Because of your problem, are you afraid to stay home alone?  2 = Sometimes  E21. Because of your problem, do you feel handicapped? 2 = Sometimes  E22. Has the problem placed stress on your relationships with members of your family or friends? 4 = Yes  E23. Because of your problem, are you depressed?  4 = Yes  F24. Does your problem interfere with your job or household responsibilities?  4 = Yes   P25. Does bending over increase your problem?  4 = Yes  TOTAL 88    DHI Scoring Instructions  The patient is asked to answer each question as it pertains to dizziness or unsteadiness problems, specifically  considering their condition during the last month. Questions are designed to incorporate functional (F), physical  (P), and emotional (E) impacts on disability.   Scores greater than 10 points should be referred to balance specialists for further evaluation.   16-34 Points (mild handicap)  36-52 Points (moderate handicap)  54+ Points (severe handicap)  Minimally Detectable Change: 17 points ([address] [place], 1990)  [place], G. SHAUNNA. and [place], C. W. (1990). The development of the Dizziness Handicap Inventory. Archives of Otolaryngology - Head and Neck Surgery 116(4): [id number].   VESTIBULAR ASSESSMENT:  GENERAL OBSERVATION: keeps eyes closed or slightly open, wears glasses   SYMPTOM BEHAVIOR:  Subjective history: per initial note above  Non-Vestibular symptoms: neck pain, headaches, nausea/vomiting, and migraine symptoms  Type of dizziness: Spinning/Vertigo and Unsteady with head/body turns  Frequency: multiple times daily, variable lengths of time  Duration: variable, up to half a day per her report  Aggravating factors: Induced by position change: supine to sit, Induced by motion: occur when walking, looking up at the ceiling, turning body quickly, turning head quickly, and driving, and neck extension  Relieving factors: head stationary and lying supine  Progression of symptoms: unchanged  OCULOMOTOR EXAM:  Ocular Alignment: normal  Ocular ROM: No Limitations  Spontaneous Nystagmus: absent  Gaze-Induced Nystagmus: unable to tell; patient can't keep eyes open with testing  Smooth Pursuits: nable to tell; patient can't keep eyes open with testing  Saccades: unable to tell; patient can't keep eyes open  Convergence/Divergence: ? cm    VESTIBULAR - OCULAR REFLEX:    Can't really test for these due to patient unable to keep eyes open   POSITIONAL TESTING: Right Dix-Hallpike: nystagmus is present, and I can see it, but patient doesn't leave eyes open long enough for true assessment Left Dix-Hallpike: seems to be negative as much as I can tell, patient also does not c/o symptoms in this position  MOTION SENSITIVITY:  Motion Sensitivity Quotient Intensity: 0 = none, 1 =  Lightheaded, 2 = Mild, 3 = Moderate, 4 = Severe, 5 = Vomiting  Intensity  1. Sitting to supine   2. Supine to L side   3. Supine to R side   4. Supine to sitting   5. L Hallpike-Dix   6. Up from L    7. R Hallpike-Dix   8. Up from R    9. Sitting, head tipped to L knee   10. Head up from L knee   11. Sitting, head tipped to R knee   12. Head up from R knee   13. Sitting head turns x5   14.Sitting head nods x5   15. In stance, 180 turn to L    16. In stance, 180 turn to R     OTHOSTATICS: NT  FUNCTIONAL GAIT: NT                                                                                                                             TREATMENT DATE:  09/24/24 THERAPEUTIC EXERCISE: To improve strength and postural strength.  Demonstration, verbal and tactile cues throughout for technique. UBE L0.5 x 1.5'F/1.5'B  NEUROMUSCULAR RE-EDUCATION: To improve balance, coordination, kinesthesia, posture, and gaze stability; and BPPV  Standing gaze stabilization w/ head turns x 10; head nods x 10 Standing back to wall w/ 1 foot propped against wall and 1UE support with smooth pursuits for thumb circles x 10 CW; x10 CCW; x10  shoulder HABD/HADD. Foam standing:  Heel/toe raises x 10 BLE  Minisquats x 10 BLE  Marching x 10 BLE Tandem stance x 1' BLE Gufoni maneuver x 3 each direction  Hallpike to R CRT for R BPPV Verbal education in brandt darhoff to do at home   09/04/24 Canalith Repositioning:  Epley Right: Number of Reps: 2  PATIENT EDUCATION: Education details:  updated HEP Person educated: Patient Education method: Explanation, Demonstration, Tactile cues, Verbal cues, and Handouts Education comprehension: verbalized understanding, verbal cues required, tactile cues required, and needs further education  HOME EXERCISE PROGRAM: Access Code: [email or web address] URL: [email or web address] Date: 09/24/2024 Prepared by: Garnette Montclair  Exercises - Standing Gaze Stabilization with Head Nod  - 1 x daily - 7 x weekly - 3 sets - 10 reps - Standing Gaze Stabilization with Head Rotation  - 1 x daily - 7 x weekly - 3 sets - 10 reps - Standing VOR Cancellation  - 1 x daily - 7 x weekly - 3 sets - 10 reps - Tandem Stance in Corner  - 1 x daily - 7 x weekly - 1 sets - 1 reps - 1 min hold - Brandt-Daroff Vestibular Exercise  - 1 x daily - 7 x weekly - 1 sets - 10 reps    GOALS: Goals reviewed with patient? Yes  SHORT TERM GOALS: Target date: 10/03/2024   Patient will report 50-75% subjective improvement in her dizziness symptoms Baseline: severe Goal status: INITIAL  2.  Patient will be able  to keep her eyes open throughout a therapy session without c/o dizziness with a more full vestibular assessment Baseline: eyes are closed approximately 75% of the time Goal status: INITIAL  3.  DHI score will improve to </= 50 points Baseline: 88 Goal status: INITIAL  4.  Patient will be able to ambulate with normal head position and normal posture  Baseline: keeps head in various sidebent positions ambulating in clinic today Goal status: INITIAL   LONG TERM GOALS: Target date: 10/31/2024   Patient will report complete resolution of vertigo symptoms Baseline:  Goal status: INITIAL  2.  Patient will be able to move her cervical spine through full ROM without inhibition, keeping eyes open in standing positions Baseline: unable to extend her neck  Goal status: INITIAL  ASSESSMENT:  CLINICAL IMPRESSION: Repeated Gufoni maneuvers bilaterally  and patient is able to keep her eyes open some better--about 50% of the time.   Hallpike Dix test repeated for R ear and patient is symptomatic with reports of increased dizziness.   She does not have any visible nystagmus but has some eye movements in general.   Educated on [employer] exercises to do at home rather than Gufoni and CRT since no nystagmus is present with either maneuver that I can tell.   Will see how these affect her dizziness.   Also added in balance exercises to address her recent reported fall. Also added in gaze stabilization exercises which the patient had difficulty completing.   Continue per POC  EVAL: Patient was seen today for physical therapy evaluation and treatment for vertigo.  Evaluation is very limited due to the patient having difficulty keeping her eyes open with positional vertigo testing and with all testing for that matter.   However, she does have nystagmus and dizziness  with R hallpike dix test.  She is treated with canalith repositioning technique for R BPPV x 2.   She is also educated in performing Gufoni maneuver to the R  followed by BB[employer] roll for R BPPV to be done at home over the weekend.  She needs to be evaluated more fully with cervical ROM, gaze stabilization, VOR testing so hopefully we can do that in the future.   Her symptoms are too severe today to do that.   PT is necessary for deficits in dizziness, cervical posture, having to keep her eyes closed to decrease her symptoms, and lack of knowledge of how to manage symptoms at home and with home exercises.   Gerald is agreeable to PT treatment plan of care and would benefit from our service due to address the above issues.    OBJECTIVE IMPAIRMENTS: difficulty walking, decreased ROM, dizziness, and postural dysfunction.   ACTIVITY LIMITATIONS: lifting, bending, sitting, and locomotion level  PARTICIPATION LIMITATIONS: meal prep, cleaning, laundry, driving, shopping, community activity, and  occupation  PERSONAL FACTORS: Behavior pattern, Time since onset of injury/illness/exacerbation, and 1-2 comorbidities: bipolar, migraines, h/o PE, asthma, anxiety, suicidal ideations are also affecting patient's functional outcome.   REHAB POTENTIAL: Good  CLINICAL DECISION MAKING: Evolving/moderate complexity  EVALUATION COMPLEXITY: Moderate   PLAN:  PT FREQUENCY: 1-2x/week  PT DURATION: 8 weeks  PLANNED INTERVENTIONS: 97164- PT Re-evaluation, 97750- Physical Performance Testing, 97110-Therapeutic exercises, 97530- Therapeutic activity, 97112- Neuromuscular re-education, 97535- Self Care, [id number]- Manual therapy, [phone]- Canalith repositioning, Patient/Family education, Balance training, Joint mobilization, and Spinal mobilization  PLAN FOR NEXT SESSION: reassess hallpike dix bilaterally; see how HEP is doing  PHYSICAL THERAPY DISCHARGE SUMMARY  Visits from [facility]  of Care: 2  Current functional level related to goals / functional outcomes: Patient only came in x 2 PT visits.   She has not returned for any further therapy.   Hopefully she is doing well.   We are happy to see her again PRN.   Will D/C PT for now.  Thanks.   Remaining deficits: Unchanged due to only came in twice   Education / Equipment: Initial HEP provided   Patient agrees to discharge. Patient goals were not met. Patient is being discharged due to not returning since the last visit.   Makensey Rego, PT 09/24/2024, 5:24 PM  "

## 2024-09-27 ENCOUNTER — Encounter: Payer: Self-pay | Admitting: Hematology and Oncology

## 2024-09-30 ENCOUNTER — Telehealth (INDEPENDENT_AMBULATORY_CARE_PROVIDER_SITE_OTHER): Admitting: Family Medicine

## 2024-09-30 ENCOUNTER — Other Ambulatory Visit: Payer: Self-pay | Admitting: Family Medicine

## 2024-09-30 ENCOUNTER — Encounter: Payer: Self-pay | Admitting: Family Medicine

## 2024-09-30 VITALS — Ht 63.0 in

## 2024-09-30 DIAGNOSIS — R42 Dizziness and giddiness: Secondary | ICD-10-CM

## 2024-09-30 DIAGNOSIS — A6000 Herpesviral infection of urogenital system, unspecified: Secondary | ICD-10-CM

## 2024-09-30 DIAGNOSIS — L309 Dermatitis, unspecified: Secondary | ICD-10-CM

## 2024-09-30 DIAGNOSIS — K047 Periapical abscess without sinus: Secondary | ICD-10-CM

## 2024-09-30 DIAGNOSIS — R635 Abnormal weight gain: Secondary | ICD-10-CM

## 2024-09-30 DIAGNOSIS — Z1231 Encounter for screening mammogram for malignant neoplasm of breast: Secondary | ICD-10-CM

## 2024-09-30 NOTE — Progress Notes (Signed)
 [facility] [address], [address] [place], [place] [address] [phone]  Date:  09/30/2024   Name:  Paula Benson   DOB:  1986/11/14   MRN:  [id number]  PCP:  Watt Harlene BROCKS, MD    Chief Complaint: Follow-up (Vertigo )   History of Present Illness:  Paula Benson is a 38 y.o. very pleasant female patient who presents with the following:  Virtual visit today- pt is at home and I am at office Pt ID confirmed with 2 factors, she gives consent for a virtual visit today The pt and myself are present on the call today    Discussed the use of AI scribe software for clinical note transcription with the patient, who gave verbal consent to proceed.  History of Present Illness Paula Benson is a 38 year old female who presents with dizziness and vertigo.  She has been experiencing dizziness and vertigo, which have been severe enough to prevent her from working on October 20th, 21st, and 22nd. The vertigo affects her ability to function at work. She is undergoing physical therapy, which initially exacerbates her symptoms, but she notes improvement in the days following the exercises.  She has not been able to visit the dentist because of financial constraints and a recent need to move due to mold in her apartment, and she likes to keep penicillin  on hand in case she needs it. She also needs refills for meclizine , acyclovir , and triamcinolone  cream.  She has not had a physical exam in over a year. She is due for a mammogram, having last had one in 2023. She also mentions needing to have her iron  levels checked.  I will order mammogram for her  She is currently dealing with housing issues due to mold in her apartment, which has been deemed uninhabitable. The [employer] is assisting her with moving by December.  She notes that she needs her FMLA updated for [employer]  She was out of work 10/20, 21 ad 22  and/ or can give her 10 absences per month   Patient Active Problem List   Diagnosis Date Noted   Port-A-Cath in place 03/28/2024   Menometrorrhagia 07/27/2016   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HCC) 12/31/2015   CAP (community acquired pneumonia) 11/02/2014   Pyelonephritis 10/31/2014   Hypokalemia 10/31/2014   Neck pain 02/05/2014   Migraine headache 08/14/2013   Palpitations 04/12/2012   History of aspiration pneumonitis 02/20/2012   Iron  deficiency anemia 02/20/2012   Recurrent urinary tract infection 08/25/2011   Genital herpes 06/09/2011   Panic disorder 03/21/2011   Chronic anticoagulation 03/17/2011   ASTHMA, INTERMITTENT 03/04/2010    Past Medical History:  Diagnosis Date   Anemia    Anxiety    Asthma    Bipolar II disorder (HCC)    Complication of anesthesia 2003   ASPIRATED DURING EMERGENCY CS   Depression    doing ok   Eclamptic seizure 2003   Fibroid    Genital herpes    Headache(784.0)    Herpes    History of blood clots    x3   History of blood transfusion MARCH 2013   Menometrorrhagia 07/27/2016   Migraines    Personal history of PE (pulmonary embolism) x 3, latest 02/2010   noncompliant with INR checks, must limit refills   Preeclampsia 2003   with first pregnancy   Pregnancy induced hypertension 2003   seized 2wks after  Pyelonephritis 2012   Seizures (HCC) 2003   after first delivery   Suicidal ideations    Urinary tract infection     Past Surgical History:  Procedure Laterality Date   CESAREAN SECTION  12/2001 and 01/2008   CESAREAN SECTION  06/26/2012   Procedure: CESAREAN SECTION;  Surgeon: Elveria Mungo, MD;  Location: [facility];  Service: Obstetrics;  Laterality: N/A;   INDUCED ABORTION  01/2010   IR IMAGING GUIDED PORT INSERTION  11/17/2023    Social History   Tobacco Use   Smoking status: Never   Smokeless tobacco: Never  Vaping Use   Vaping status: Never Used  Substance Use Topics   Alcohol  use: No   Drug use: No     Family History  Problem Relation Age of Onset   Diabetes Mother        Type 2   Anemia Sister    Hypertension Sister    Breast cancer Maternal Aunt    Anesthesia problems Neg Hx     No Known Allergies  Medication list has been reviewed and updated.  Current Outpatient Medications on File Prior to Visit  Medication Sig Dispense Refill   acetaminophen  (TYLENOL ) 500 MG tablet Take 1,000 mg by mouth every 6 (six) hours as needed for mild pain, moderate pain or headache.     albuterol  (VENTOLIN  HFA) 108 (90 Base) MCG/ACT inhaler Inhale 2 puffs into the lungs every 6 (six) hours as needed for wheezing or shortness of breath. 1 each 6   ALPRAZolam  (XANAX ) 0.25 MG tablet Take 1 tablet (0.25 mg total) by mouth 3 (three) times daily as needed for anxiety. 20 tablet 0   ARIPiprazole (ABILIFY) 15 MG tablet Take 1 tablet (15 mg total) by mouth daily. 30 tablet 0   busPIRone (BUSPAR) 10 MG tablet Take 1 tablet (10 mg total) by mouth 2 (two) times daily. 60 tablet 0   cetirizine  (ZYRTEC  ALLERGY) 10 MG tablet Take 1 tablet (10 mg total) by mouth daily for 14 days. 14 tablet 0   doxycycline  (VIBRAMYCIN ) 100 MG capsule Take 1 capsule (100 mg total) by mouth 2 (two) times daily. 20 capsule 0   Fluocinolone  Acetonide Scalp 0.01 % OIL Apply to scalp daily as needed, wash out 8- 12 hours later 118 mL 1   fluticasone  (FLONASE ) 50 MCG/ACT nasal spray Place 2 sprays into both nostrils daily. 16 g 6   lidocaine -prilocaine  (EMLA ) cream APPLY TOPICALLY AS NEEDED 30 g 0   lurasidone (LATUDA) 40 MG TABS tablet Take 40 mg by mouth daily.     ondansetron  (ZOFRAN ) 4 MG tablet Take 1 tablet (4 mg total) by mouth every 8 (eight) hours as needed for nausea or vomiting. 20 tablet 0   phentermine  15 MG capsule Take 1 capsule (15 mg total) by mouth every morning. 30 capsule 0   phentermine  37.5 MG capsule Take 1 capsule (37.5 mg total) by mouth every morning. 30 capsule 1   Rimegepant Sulfate (NURTEC) 75 MG TBDP TAKE 1  TABLET EVERY OTHER DAY FOR HEADACHE PREVENTION 15 tablet 2   tiZANidine  (ZANAFLEX ) 4 MG tablet Take 1 tablet (4 mg total) by mouth every 8 (eight) hours as needed for muscle spasms. 30 tablet 6   Vitamin D, Ergocalciferol, (DRISDOL) 1.25 MG (50000 UNIT) CAPS capsule Take 50,000 Units by mouth once a week.     No current facility-administered medications on file prior to visit.    Review of Systems:  As per HPI- otherwise negative.  Physical Examination: There were no vitals filed for this visit. Vitals:   09/30/24 1422  Height: 5' 3 (1.6 m)   Body mass index is 33.05 kg/m. Ideal Body Weight: Weight in (lb) to have BMI = 25: 140.8  Pt observed via mychart video-she looks well, her normal self  Assessment and Plan: Encounter for screening mammogram for malignant neoplasm of breast - Plan: MM 3D SCREENING MAMMOGRAM BILATERAL BREAST  Chronic vertigo  Assessment & Plan Vertigo Dizziness and vertigo likely due to benign paroxysmal positional vertigo.  She does not know making progress in physical therapy.  Further evaluation needed to exclude other causes. - Attend neurology appointment on Friday. - Update paperwork for [employer] for absences on October 20th, 21st, and 22nd.  Iron  deficiency anemia - Schedule appointment to check iron  levels.  Dental prophylaxis Unable to visit dentist due to financial and housing issues. Prefers penicillin  for potential dental needs. - Refill penicillin  prescription.  Medication refills Requires refills for meclizine , acyclovir , and triamcinolone  cream. - Refill meclizine , acyclovir , and triamcinolone  cream at [facility] on [address].  General Health Maintenance Due for physical exam and mammogram. - Order mammogram at med center. - Schedule physical exam.  Signed Harlene Schroeder, MD

## 2024-10-02 ENCOUNTER — Ambulatory Visit: Admitting: Rehabilitation

## 2024-10-04 ENCOUNTER — Ambulatory Visit: Admitting: Diagnostic Neuroimaging

## 2024-10-04 ENCOUNTER — Encounter: Payer: Self-pay | Admitting: Diagnostic Neuroimaging

## 2024-10-04 ENCOUNTER — Telehealth (HOSPITAL_BASED_OUTPATIENT_CLINIC_OR_DEPARTMENT_OTHER): Payer: Self-pay

## 2024-10-04 VITALS — Ht 63.0 in | Wt 186.0 lb

## 2024-10-04 DIAGNOSIS — G43101 Migraine with aura, not intractable, with status migrainosus: Secondary | ICD-10-CM

## 2024-10-04 DIAGNOSIS — G43809 Other migraine, not intractable, without status migrainosus: Secondary | ICD-10-CM

## 2024-10-04 DIAGNOSIS — I679 Cerebrovascular disease, unspecified: Secondary | ICD-10-CM | POA: Diagnosis not present

## 2024-10-04 DIAGNOSIS — R42 Dizziness and giddiness: Secondary | ICD-10-CM

## 2024-10-04 MED ORDER — RIZATRIPTAN BENZOATE 10 MG PO TBDP: 10.0000 mg | ORAL_TABLET | ORAL | 11 refills | Status: AC | PRN

## 2024-10-04 MED ORDER — QULIPTA 60 MG PO TABS: 60.0000 mg | ORAL_TABLET | Freq: Every day | ORAL | 6 refills | Status: AC

## 2024-10-04 NOTE — Progress Notes (Signed)
 [facility]  PATIENT: Paula Benson DOB: 06-09-1986  REFERRING CLINICIAN: Copland, Harlene BROCKS, MD HISTORY FROM: patient  REASON FOR VISIT: new consult   HISTORICAL  CHIEF COMPLAINT:  Chief Complaint  Patient presents with   Dizziness    Rm  7 alone  Pt is well, reports she has been having random episodes of dizziness since Sept. She has more trouble with dizziness in the mornings or when she turns her head.     HISTORY OF PRESENT ILLNESS:   38 year old female here for evaluation of intermittent vertigo and headaches.  Patient has intermittent unprovoked room spinning sensation, nausea, lasting for minutes or hours at a time.  These can occur in sitting or standing position with or without change in position.  Symptoms started around 2023.  Sometimes she has some lightheadedness and blurred vision.  Now happening a few times a week.  Also continues with headaches with migraine features which started in teenage years.  She describes retro-orbital right left-sided throbbing severe headaches associated with sensitive to light, sound, nausea vomiting, preceded by visual aura.  Patient also struggles with chronic insomnia averaging 4 to 5 hours of sleep at nighttime.  Patient also has long history of iron  deficiency anemia, recently improved with iron  infusions.   REVIEW OF SYSTEMS: Full 14 system review of systems performed and negative with exception of: as per HPI.  ALLERGIES: No Known Allergies  HOME MEDICATIONS: Outpatient Medications Prior to Visit  Medication Sig Dispense Refill   acetaminophen  (TYLENOL ) 500 MG tablet Take 1,000 mg by mouth every 6 (six) hours as needed for mild pain, moderate pain or headache.     acyclovir  (ZOVIRAX ) 200 MG capsule TAKE 2 CAPSULES BY MOUTH 2 TIMES A DAY FOR HSV SUPRESSION. IF OUTBREAK OCCURS, TAKE 4 CAPSULES 3 TIMES A DAY FOR 2 DAYS. 60 capsule 1   albuterol  (VENTOLIN  HFA) 108 (90 Base) MCG/ACT inhaler Inhale 2  puffs into the lungs every 6 (six) hours as needed for wheezing or shortness of breath. 1 each 6   ALPRAZolam  (XANAX ) 0.25 MG tablet Take 1 tablet (0.25 mg total) by mouth 3 (three) times daily as needed for anxiety. 20 tablet 0   ARIPiprazole (ABILIFY) 15 MG tablet Take 1 tablet (15 mg total) by mouth daily. 30 tablet 0   busPIRone (BUSPAR) 10 MG tablet Take 1 tablet (10 mg total) by mouth 2 (two) times daily. 60 tablet 0   cetirizine  (ZYRTEC  ALLERGY) 10 MG tablet Take 1 tablet (10 mg total) by mouth daily for 14 days. 14 tablet 0   doxycycline  (VIBRAMYCIN ) 100 MG capsule Take 1 capsule (100 mg total) by mouth 2 (two) times daily. 20 capsule 0   Fluocinolone  Acetonide Scalp 0.01 % OIL Apply to scalp daily as needed, wash out 8- 12 hours later 118 mL 1   fluticasone  (FLONASE ) 50 MCG/ACT nasal spray Place 2 sprays into both nostrils daily. 16 g 6   lidocaine -prilocaine  (EMLA ) cream APPLY TOPICALLY AS NEEDED 30 g 0   lurasidone (LATUDA) 40 MG TABS tablet Take 40 mg by mouth daily.     meclizine  (ANTIVERT ) 25 MG tablet TAKE 1 TABLET BY MOUTH 3 TIMES A DAY AS NEEDED FOR DIZZINESS 30 tablet 1   ondansetron  (ZOFRAN ) 4 MG tablet Take 1 tablet (4 mg total) by mouth every 8 (eight) hours as needed for nausea or vomiting. 20 tablet 0   penicillin  v potassium (VEETID) 500 MG tablet TAKE 1 TABLET BY MOUTH 3 TIMES A  DAY 30 tablet 0   phentermine  15 MG capsule Take 1 capsule (15 mg total) by mouth every morning. 30 capsule 0   phentermine  37.5 MG capsule TAKE 1 CAPSULE BY MOUTH EVERY MORNING 30 capsule 1   tiZANidine  (ZANAFLEX ) 4 MG tablet Take 1 tablet (4 mg total) by mouth every 8 (eight) hours as needed for muscle spasms. 30 tablet 6   triamcinolone  ointment (KENALOG ) 0.5 % APPLY TO AFFECTED AREA(S) 2 TIMES A DAY AS NEEDED. DO NOT USE ON FACE. 60 g 1   Vitamin D, Ergocalciferol, (DRISDOL) 1.25 MG (50000 UNIT) CAPS capsule Take 50,000 Units by mouth once a week.     Rimegepant Sulfate (NURTEC) 75 MG TBDP TAKE  1 TABLET EVERY OTHER DAY FOR HEADACHE PREVENTION 15 tablet 2   No facility-administered medications prior to visit.    PAST MEDICAL HISTORY: Past Medical History:  Diagnosis Date   Anemia    Anxiety    Asthma    Bipolar II disorder (HCC)    Complication of anesthesia 2003   ASPIRATED DURING EMERGENCY CS   Depression    doing ok   Eclamptic seizure 2003   Fibroid    Genital herpes    Headache(784.0)    Herpes    History of blood clots    x3   History of blood transfusion MARCH 2013   Menometrorrhagia 07/27/2016   Migraines    Personal history of PE (pulmonary embolism) x 3, latest 02/2010   noncompliant with INR checks, must limit refills   Preeclampsia 2003   with first pregnancy   Pregnancy induced hypertension 2003   seized 2wks after   Pyelonephritis 2012   Seizures (HCC) 2003   after first delivery   Suicidal ideations    Urinary tract infection     PAST SURGICAL HISTORY: Past Surgical History:  Procedure Laterality Date   CESAREAN SECTION  12/2001 and 01/2008   CESAREAN SECTION  06/26/2012   Procedure: CESAREAN SECTION;  Surgeon: Elveria Mungo, MD;  Location: [facility];  Service: Obstetrics;  Laterality: N/A;   INDUCED ABORTION  01/2010   IR IMAGING GUIDED PORT INSERTION  11/17/2023    FAMILY HISTORY: Family History  Problem Relation Age of Onset   Diabetes Mother        Type 2   Anemia Sister    Hypertension Sister    Breast cancer Maternal Aunt    Anesthesia problems Neg Hx     SOCIAL HISTORY: Social History   Socioeconomic History   Marital status: Single    Spouse name: Not on file   Number of children: Not on file   Years of education: Not on file   Highest education level: Bachelor's degree (e.g., BA, AB, BS)  Occupational History   Not on file  Tobacco Use   Smoking status: Never   Smokeless tobacco: Never  Vaping Use   Vaping status: Never Used  Substance and Sexual Activity   Alcohol  use: No   Drug use: No   Sexual activity:  Yes    Birth control/protection: Condom  Other Topics Concern   Not on file  Social History Narrative   Student. Plans on staying here in [place] (originally living in [place] ). 2 daughters.   Social Drivers of Health   Financial Resource Strain: Medium Risk (08/26/2024)   Overall Financial Resource Strain (CARDIA)    Difficulty of Paying Living Expenses: Somewhat hard  Food Insecurity: Food Insecurity Present (08/26/2024)   Hunger Vital Sign  Worried About Programme Researcher, Broadcasting/film/video in the Last Year: Sometimes true    Ran Out of Food in the Last Year: Sometimes true  Transportation Needs: Unmet Transportation Needs (08/26/2024)   PRAPARE - Administrator, Civil Service (Medical): Yes    Lack of Transportation (Non-Medical): Yes  Physical Activity: Inactive (08/26/2024)   Exercise Vital Sign    Days of Exercise per Week: 0 days    Minutes of Exercise per Session: Not on file  Stress: Stress Concern Present (08/26/2024)   [employer] of Occupational Health - Occupational Stress Questionnaire    Feeling of Stress: Very much  Social Connections: Moderately Integrated (08/26/2024)   Social Connection and Isolation Panel    Frequency of Communication with Friends and Family: More than three times a week    Frequency of Social Gatherings with Friends and Family: Once a week    Attends Religious Services: 1 to 4 times per year    Active Member of [employer] or Organizations: Yes    Attends Banker Meetings: 1 to 4 times per year    Marital Status: Never married  Intimate Partner Violence: Unknown (01/11/2023)   Received from [facility]   HITS    Physically Hurt: Not on file    Insult or Talk Down To: Not on file    Threaten Physical Harm: Not on file    Scream or Curse: Not on file     PHYSICAL EXAM  GENERAL EXAM/CONSTITUTIONAL: Vitals:  Orthostatic VS for the past 24 hrs (Last 3 readings):  BP- Lying Pulse- Lying BP- Sitting Pulse- Sitting BP- Standing  at 3 minutes Pulse- Standing at 3 minutes  10/04/24 1027 (!) 147/97 86 (!) 150/109 86 (!) 148/97 87   Vitals:   10/04/24 1025  Weight: 186 lb (84.4 kg)  Height: 5' 3 (1.6 m)   Body mass index is 32.95 kg/m. Wt Readings from Last 3 Encounters:  10/04/24 186 lb (84.4 kg)  09/05/24 186 lb 9.6 oz (84.6 kg)  08/14/24 189 lb 14.4 oz (86.1 kg)   Patient is in no distress; well developed, nourished and groomed; neck is supple  CARDIOVASCULAR: Examination of carotid arteries is normal; no carotid bruits Regular rate and rhythm, no murmurs Examination of peripheral vascular system by observation and palpation is normal  EYES: Ophthalmoscopic exam of optic discs and posterior segments is normal; no papilledema or hemorrhages No results found.  MUSCULOSKELETAL: Gait, strength, tone, movements noted in Neurologic exam below  NEUROLOGIC: MENTAL STATUS:      No data to display         awake, alert, oriented to person, place and time recent and remote memory intact normal attention and concentration language fluent, comprehension intact, naming intact fund of knowledge appropriate  CRANIAL NERVE:  2nd - no papilledema on fundoscopic exam 2nd, 3rd, 4th, 6th - pupils equal and reactive to light, visual fields full to confrontation, extraocular muscles intact, no nystagmus 5th - facial sensation symmetric 7th - facial strength symmetric 8th - hearing intact 9th - palate elevates symmetrically, uvula midline 11th - shoulder shrug symmetric 12th - tongue protrusion midline  MOTOR:  normal bulk and tone, full strength in the BUE, BLE  SENSORY:  normal and symmetric to light touch, temperature, vibration  COORDINATION:  finger-nose-finger, fine finger movements normal  REFLEXES:  deep tendon reflexes present and symmetric  GAIT/STATION:  narrow based gait     DIAGNOSTIC DATA (LABS, IMAGING, TESTING) - I reviewed patient records,  labs, notes, testing and imaging  myself where available.  Lab Results  Component Value Date   WBC 4.2 08/12/2024   HGB 12.3 08/12/2024   HCT 36.0 08/12/2024   MCV 90.5 08/12/2024   PLT 332 08/12/2024      Component Value Date/Time   NA 138 06/28/2023 1618   K 3.8 06/28/2023 1618   CL 104 06/28/2023 1618   CO2 26 06/28/2023 1618   GLUCOSE 103 (H) 06/28/2023 1618   BUN 14 06/28/2023 1618   CREATININE 0.85 06/28/2023 1618   CREATININE 0.83 12/02/2014 1614   CALCIUM  9.1 06/28/2023 1618   PROT 6.9 06/28/2023 1618   ALBUMIN 4.0 06/28/2023 1618   AST 15 06/28/2023 1618   ALT 12 06/28/2023 1618   ALKPHOS 54 06/28/2023 1618   BILITOT 0.3 06/28/2023 1618   GFRNONAA >60 12/08/2018 2237   GFRAA >60 12/08/2018 2237   No results found for: CHOL, HDL, LDLCALC, LDLDIRECT, TRIG, CHOLHDL Lab Results  Component Value Date   HGBA1C 5.8 10/05/2022   Lab Results  Component Value Date   VITAMINB12 295 05/17/2024   Lab Results  Component Value Date   TSH 1.58 10/05/2022    09/09/22 MRI brain [I reviewed images myself and agree with interpretation. -VRP]  1. Unremarkable MRI appearance of the brain. No evidence of acute intracranial abnormality. 2. Abnormal T1 hypointense marrow signal within the calvarium and visualized upper cervical spine. While this finding can reflect a marrow infiltrative process, the most common causes include chronic anemia, smoking and obesity.   09/09/22 MRA head [I reviewed images myself and agree with interpretation. -VRP]  1. No intracranial aneurysm is identified. 2. 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   ASSESSMENT AND PLAN  38 y.o. year old female here with:  Meds tried: nurtec, imitrex , aleve, topiramate, propranolol  Dx:  1. Migraine with aura and with status migrainosus, not intractable   2. Vestibular migraine   3. Vertigo   4. Intracranial vascular stenosis     PLAN:  WORSENING UNPROVOKED VERTIGO (intermittent;  could be related to vestibular migraine vs other central cause) - check MRI brain / IAC w/wo - check MRA head (follow up prior left PCA stenosis)   MIGRAINE WITH AURA (~2-3 / week)  MIGRAINE PREVENTION  LIFESTYLE CHANGES -Stop or avoid smoking -Decrease or avoid caffeine  / alcohol  -Eat and sleep on a regular schedule; sleep hygiene reviewed; consider sleep study -Exercise several times per week - change nurtec to qulipta 60mg  daily    MIGRAINE RESCUE  - ibuprofen , tylenol  as needed - start rizatriptan (Maxalt) 10mg  as needed for breakthrough headache; may repeat x 1 after 2 hours; max 2 tabs per day or 8 per month  Orders Placed This Encounter  Procedures   MR BRAIN/IAC W WO CONTRAST   MR ANGIO HEAD WO CONTRAST   Meds ordered this encounter  Medications   Atogepant (QULIPTA) 60 MG TABS    Sig: Take 1 tablet (60 mg total) by mouth daily.    Dispense:  30 tablet    Refill:  6   rizatriptan (MAXALT-MLT) 10 MG disintegrating tablet    Sig: Take 1 tablet (10 mg total) by mouth as needed for migraine. May repeat in 2 hours if needed    Dispense:  9 tablet    Refill:  11   Return in about 6 months (around 04/03/2025) for MyChart visit (15 min).    EDUARD FABIENE HANLON, MD 10/04/2024, 11:06 AM  Certified in Neurology, Neurophysiology and Neuroimaging  [facility] [address] [place], [place] [address] [phone]

## 2024-10-04 NOTE — Patient Instructions (Signed)
  WORSENING UNPROVOKED VERTIGO (intermittent; could be related to vestibular migraine vs other central cause) - check MRI brain / IAC w/wo - check MRA head (follow up prior left PCA stenosis)   MIGRAINE WITH AURA (~2-3 / week)  MIGRAINE PREVENTION  LIFESTYLE CHANGES -Stop or avoid smoking -Decrease or avoid caffeine  / alcohol  -Eat and sleep on a regular schedule -Exercise several times per week - change nurtec to qulipta 60mg  daily    MIGRAINE RESCUE  - ibuprofen , tylenol  as needed - start rizatriptan (Maxalt) 10mg  as needed for breakthrough headache; may repeat x 1 after 2 hours; max 2 tabs per day or 8 per month

## 2024-10-09 ENCOUNTER — Encounter: Payer: Self-pay | Admitting: Hematology and Oncology

## 2024-10-11 ENCOUNTER — Telehealth: Payer: Self-pay

## 2024-10-11 ENCOUNTER — Encounter: Payer: Self-pay | Admitting: Hematology and Oncology

## 2024-10-11 ENCOUNTER — Other Ambulatory Visit (HOSPITAL_COMMUNITY): Payer: Self-pay

## 2024-10-11 NOTE — Telephone Encounter (Signed)
 Clinical questions have been answered and PA submitted. PA currently Pending. Please be advised that most companies allow up to 30 days to make a decision. We will advise when a determination has been made, or follow up in 1 week.   Please reach out to our team, Rx Prior Auth Pool, if you haven't heard back in a week.

## 2024-10-11 NOTE — Telephone Encounter (Signed)
 Pharmacy Patient Advocate Encounter   Received notification from Fax that prior authorization for Rizatriptan 10mg  ODT is required/requested.   Insurance verification completed.   The patient is insured through [facility].   Per test claim: PA required; PA started via CoverMyMeds. KEY [id number] . Waiting for clinical questions to populate.

## 2024-10-11 NOTE — Telephone Encounter (Signed)
 Pharmacy Patient Advocate Encounter   Received notification from Fax that prior authorization for Qulipta 60mg  Tablets is required/requested.   Insurance verification completed.   The patient is insured through [facility].   Per test claim: PA required; PA started via CoverMyMeds. KEY [id number] . Waiting for clinical questions to populate.

## 2024-10-14 NOTE — Telephone Encounter (Signed)
 Pharmacy Patient Advocate Encounter  Received notification from [employer] that Prior Authorization for Rizatriptan has been APPROVED from 10/11/2024 to 10/11/2025   PA #/Case ID/Reference #: [id number]

## 2024-10-15 NOTE — Telephone Encounter (Signed)
 Dr. Margaret- what do you recommend? I reviewed her chart and she has only tried topamax from the list of covered meds. They want her to try two.

## 2024-10-15 NOTE — Telephone Encounter (Signed)
 Has tried: topiramate and propranolol in the past.   EDUARD FABIENE HANLON, MD 10/15/2024, 5:09 PM Certified in Neurology, Neurophysiology and Neuroimaging  [facility] [address], [address] [place], [place] [address] [phone]

## 2024-10-15 NOTE — Telephone Encounter (Signed)
 Pharmacy Patient Advocate Encounter  Received notification from [employer] that Prior Authorization for Paula Benson has been DENIED.  Full denial letter will be uploaded to the media tab. See denial reason below.   PA #/Case ID/Reference #: [id number]

## 2024-10-17 ENCOUNTER — Other Ambulatory Visit (HOSPITAL_COMMUNITY): Payer: Self-pay

## 2024-10-17 ENCOUNTER — Encounter: Payer: Self-pay | Admitting: Hematology and Oncology

## 2024-10-17 NOTE — Telephone Encounter (Signed)
 Pharmacy Patient Advocate Encounter   Received notification from Fax that prior authorization for Paula Benson is required/requested.   Insurance verification completed.   The patient is insured through [facility].   Per test claim: PA required; PA submitted to above mentioned insurance via Latent Key/confirmation #/EOC [facility] Status is pending  Resubmitted PA

## 2024-10-18 NOTE — Telephone Encounter (Signed)
 Pharmacy Patient Advocate Encounter  Received notification from [employer] that Prior Authorization for Paula Benson has been DENIED.  Full denial letter will be uploaded to the media tab. See denial reason below.   PA #/Case ID/Reference #: [id number]

## 2024-10-21 NOTE — Telephone Encounter (Signed)
 Can you appeal? Pt has tried topiramate and propranolol per Dr. Margaret

## 2024-10-21 NOTE — Telephone Encounter (Signed)
 Will forward to the pharmacist for an appeals review

## 2024-10-22 ENCOUNTER — Telehealth: Payer: Self-pay | Admitting: Pharmacist

## 2024-10-22 NOTE — Telephone Encounter (Signed)
 In order to submit a strong appeal, the insurance plan is requesting documentation confirming that the patient has tried at least two of the preferred medications for a minimum duration of two months each. Dispense records currently indicate that the patient only received a 15-day supply of propranolol.  Could you clarify the reason she discontinued propranolol after only 15 days? This information will help support the appeal and ensure we provide the necessary documentation.    Thank you, Devere Pandy, PharmD Clinical Pharmacist  [facility]  Direct Dial: [phone]

## 2024-10-22 NOTE — Telephone Encounter (Signed)
 We do not have any detailed information as to her trial of propranolol. Mychart message sent to patient.

## 2024-10-22 NOTE — Telephone Encounter (Signed)
 Tried to call.  Mailbox full, could not LM.

## 2024-11-06 ENCOUNTER — Encounter: Payer: Self-pay | Admitting: Family Medicine

## 2024-11-07 MED ORDER — PROPRANOLOL HCL 10 MG PO TABS: 10.0000 mg | ORAL_TABLET | Freq: Two times a day (BID) | ORAL | 3 refills | Status: AC

## 2024-11-07 NOTE — Telephone Encounter (Signed)
 Meds ordered this encounter  Medications   propranolol (INDERAL) 10 MG tablet    Sig: Take 1 tablet (10 mg total) by mouth 2 (two) times daily.    Dispense:  60 tablet    Refill:  3   EDUARD FABIENE HANLON, MD 11/07/2024, 3:15 PM Certified in Neurology, Neurophysiology and Neuroimaging  [facility] [address] [place], [place] [address] [phone]

## 2024-11-14 ENCOUNTER — Inpatient Hospital Stay: Attending: Hematology and Oncology

## 2024-11-14 DIAGNOSIS — D509 Iron deficiency anemia, unspecified: Secondary | ICD-10-CM | POA: Insufficient documentation

## 2024-11-18 ENCOUNTER — Encounter: Payer: Self-pay | Admitting: Hematology and Oncology

## 2024-11-18 ENCOUNTER — Inpatient Hospital Stay: Admitting: Hematology and Oncology

## 2024-11-18 NOTE — Assessment & Plan Note (Deleted)
(  She had a prior history of multiple PEs and had seen Dr. Timmy previously)   IV iron : March 2023, October 2023, 07/07/2023, December 2024, March 2025, August 2025   Labs  02/06/2022: Ferritin 2.6, TIBC 669, there was no hemoglobin available to review. 02/17/2022: Hemoglobin 7.9, MCV 66, RDW 21.2, platelets 593 06/28/23: Hemoglobin: 10.6, Ferritin: 3.6 10/19/2023: Hemoglobin 11.1, MCV 85.8, B12 159, iron  saturation 3%, ferritin 6 02/02/2024: Hemoglobin 11.5, MCV 87.8, iron  saturation 8%, ferritin 9, B12 189 (ice chip cravings) 04/16/24: Hemoglobin 11.8, MCV 88.8, iron  saturation 25%, B12 240, ferritin 56 05/17/2024: Hemoglobin 12.1, B12 295, ferritin 24, iron  saturation 12% 08/12/2024: Hemoglobin 12.3, MCV 90.5, iron  saturation 22%, ferritin 128   Cause: unclear. She has had heavy menstrual cycles back-to-back recently   unexplained bruising: Workup including platelet function assay: Normal, von Willebrand factor : Normal, factor VIII: 98% von Willebrand factor  antigen: 86%   Multiple problems including benign positional vertigo that comes and goes.   Treatment plan: No need of IV iron  at this time. Recheck labs in 3 months and follow-up for telephone visit

## 2024-11-19 ENCOUNTER — Inpatient Hospital Stay

## 2024-11-19 DIAGNOSIS — D509 Iron deficiency anemia, unspecified: Secondary | ICD-10-CM | POA: Diagnosis present

## 2024-11-19 DIAGNOSIS — Z95828 Presence of other vascular implants and grafts: Secondary | ICD-10-CM

## 2024-11-19 LAB — CMP (CANCER CENTER ONLY)
ALT: 11 U/L (ref 0–44)
AST: 18 U/L (ref 15–41)
Albumin: 4.2 g/dL (ref 3.5–5.0)
Alkaline Phosphatase: 54 U/L (ref 38–126)
Anion gap: 10 (ref 5–15)
BUN: 9 mg/dL (ref 6–20)
CO2: 26 mmol/L (ref 22–32)
Calcium: 8.9 mg/dL (ref 8.9–10.3)
Chloride: 102 mmol/L (ref 98–111)
Creatinine: 0.94 mg/dL (ref 0.44–1.00)
GFR, Estimated: >60 mL/min (ref >60)
Glucose, Bld: 90 mg/dL (ref 70–99)
Potassium: 3.3 mmol/L — ABNORMAL LOW (ref 3.5–5.1)
Sodium: 138 mmol/L (ref 135–145)
Total Bilirubin: 0.5 mg/dL (ref 0.0–1.2)
Total Protein: 7.3 g/dL (ref 6.5–8.1)

## 2024-11-19 LAB — CBC WITH DIFFERENTIAL (CANCER CENTER ONLY)
Abs Immature Granulocytes: 0 K/uL (ref 0.00–0.07)
Basophils Absolute: 0 K/uL (ref 0.0–0.1)
Basophils Relative: 1 %
Eosinophils Absolute: 0.2 K/uL (ref 0.0–0.5)
Eosinophils Relative: 3 %
HCT: 34.4 % — ABNORMAL LOW (ref 36.0–46.0)
Hemoglobin: 11.6 g/dL — ABNORMAL LOW (ref 12.0–15.0)
Immature Granulocytes: 0 %
Lymphocytes Relative: 42 %
Lymphs Abs: 2.5 K/uL (ref 0.7–4.0)
MCH: 30 pg (ref 26.0–34.0)
MCHC: 33.7 g/dL (ref 30.0–36.0)
MCV: 88.9 fL (ref 80.0–100.0)
Monocytes Absolute: 0.5 K/uL (ref 0.1–1.0)
Monocytes Relative: 8 %
Neutro Abs: 2.8 K/uL (ref 1.7–7.7)
Neutrophils Relative %: 46 %
Platelet Count: 392 K/uL (ref 150–400)
RBC: 3.87 MIL/uL (ref 3.87–5.11)
RDW: 13.3 % (ref 11.5–15.5)
WBC Count: 5.9 K/uL (ref 4.0–10.5)
nRBC: 0 % (ref 0.0–0.2)

## 2024-11-22 ENCOUNTER — Telehealth: Payer: Self-pay | Admitting: Hematology and Oncology

## 2024-11-22 NOTE — Telephone Encounter (Signed)
 I spoke with patient and she is aware to come in on 12/06/24 for lab only viist.

## 2024-11-23 ENCOUNTER — Other Ambulatory Visit: Payer: Self-pay | Admitting: Family Medicine

## 2024-11-23 DIAGNOSIS — K047 Periapical abscess without sinus: Secondary | ICD-10-CM

## 2024-11-26 ENCOUNTER — Encounter: Payer: Self-pay | Admitting: Family Medicine

## 2024-11-26 ENCOUNTER — Telehealth: Payer: Self-pay

## 2024-11-26 NOTE — Telephone Encounter (Signed)
 Spoke to pt 11/25/2024 in regards to refill request for penicillin , pt states as discussed in the past I would like to continue refills on the antibiotic in case it is needed advised pt a message would be sent back to PCP.

## 2024-12-06 ENCOUNTER — Inpatient Hospital Stay

## 2024-12-09 ENCOUNTER — Inpatient Hospital Stay

## 2024-12-10 ENCOUNTER — Inpatient Hospital Stay: Attending: Hematology and Oncology

## 2024-12-10 DIAGNOSIS — D509 Iron deficiency anemia, unspecified: Secondary | ICD-10-CM

## 2024-12-10 LAB — IRON AND IRON BINDING CAPACITY (CC-WL,HP ONLY)
Iron: 24 ug/dL — ABNORMAL LOW (ref 28–170)
Saturation Ratios: 6 % — ABNORMAL LOW (ref 10.4–31.8)
TIBC: 440 ug/dL (ref 250–450)
UIBC: 415 ug/dL

## 2024-12-10 LAB — CBC WITH DIFFERENTIAL (CANCER CENTER ONLY)
Abs Immature Granulocytes: 0.02 K/uL (ref 0.00–0.07)
Basophils Absolute: 0.1 K/uL (ref 0.0–0.1)
Basophils Relative: 1 %
Eosinophils Absolute: 0.2 K/uL (ref 0.0–0.5)
Eosinophils Relative: 3 %
HCT: 35.3 % — ABNORMAL LOW (ref 36.0–46.0)
Hemoglobin: 11.4 g/dL — ABNORMAL LOW (ref 12.0–15.0)
Immature Granulocytes: 0 %
Lymphocytes Relative: 25 %
Lymphs Abs: 1.5 K/uL (ref 0.7–4.0)
MCH: 28.4 pg (ref 26.0–34.0)
MCHC: 32.3 g/dL (ref 30.0–36.0)
MCV: 87.8 fL (ref 80.0–100.0)
Monocytes Absolute: 0.3 K/uL (ref 0.1–1.0)
Monocytes Relative: 5 %
Neutro Abs: 4 K/uL (ref 1.7–7.7)
Neutrophils Relative %: 66 %
Platelet Count: 403 K/uL — ABNORMAL HIGH (ref 150–400)
RBC: 4.02 MIL/uL (ref 3.87–5.11)
RDW: 13.7 % (ref 11.5–15.5)
WBC Count: 6.1 K/uL (ref 4.0–10.5)
nRBC: 0 % (ref 0.0–0.2)

## 2024-12-10 LAB — CMP (CANCER CENTER ONLY)
ALT: 9 U/L (ref 0–44)
AST: 17 U/L (ref 15–41)
Albumin: 4.1 g/dL (ref 3.5–5.0)
Alkaline Phosphatase: 55 U/L (ref 38–126)
Anion gap: 9 (ref 5–15)
BUN: 14 mg/dL (ref 6–20)
CO2: 24 mmol/L (ref 22–32)
Calcium: 8.9 mg/dL (ref 8.9–10.3)
Chloride: 105 mmol/L (ref 98–111)
Creatinine: 0.92 mg/dL (ref 0.44–1.00)
GFR, Estimated: >60 mL/min
Glucose, Bld: 100 mg/dL — ABNORMAL HIGH (ref 70–99)
Potassium: 4.5 mmol/L (ref 3.5–5.1)
Sodium: 138 mmol/L (ref 135–145)
Total Bilirubin: 0.2 mg/dL (ref 0.0–1.2)
Total Protein: 7.1 g/dL (ref 6.5–8.1)

## 2024-12-10 LAB — FERRITIN: Ferritin: 9 ng/mL — ABNORMAL LOW (ref 11–307)

## 2024-12-11 ENCOUNTER — Inpatient Hospital Stay: Admitting: Hematology and Oncology

## 2024-12-16 ENCOUNTER — Other Ambulatory Visit (HOSPITAL_COMMUNITY): Payer: Self-pay | Admitting: Hematology and Oncology

## 2024-12-16 ENCOUNTER — Inpatient Hospital Stay: Admitting: Hematology and Oncology

## 2024-12-16 DIAGNOSIS — D509 Iron deficiency anemia, unspecified: Secondary | ICD-10-CM | POA: Diagnosis not present

## 2024-12-16 NOTE — Assessment & Plan Note (Signed)
(  She had a prior history of multiple PEs and had seen Dr. Timmy previously)   IV iron : March 2023, October 2023, 07/07/2023, December 2024, March 2025, August 2025   Labs  02/06/2022: Ferritin 2.6, TIBC 669, there was no hemoglobin available to review. 02/17/2022: Hemoglobin 7.9, MCV 66, RDW 21.2, platelets 593 06/28/23: Hemoglobin: 10.6, Ferritin: 3.6 10/19/2023: Hemoglobin 11.1, MCV 85.8, B12 159, iron  saturation 3%, ferritin 6 02/02/2024: Hemoglobin 11.5, MCV 87.8, iron  saturation 8%, ferritin 9, B12 189 (ice chip cravings) 04/16/24: Hemoglobin 11.8, MCV 88.8, iron  saturation 25%, B12 240, ferritin 56 05/17/2024: Hemoglobin 12.1, B12 295, ferritin 24, iron  saturation 12% 08/12/2024: Hemoglobin 12.3, MCV 90.5, iron  saturation 22%, ferritin 128 12/10/2024: Hemoglobin 9.4, MCV 87.8, platelets 403, CMP: Normal, iron  saturation 6%, ferritin 9   Cause: Due to heavy menstrual cycles  unexplained bruising: Workup including platelet function assay: Normal, von Willebrand factor : Normal, factor VIII: 98% von Willebrand factor  antigen: 86%   Multiple problems including benign positional vertigo that comes and goes.   Treatment plan: Recommend IV iron  therapy Labs in 3 months and telephone visit after that to discuss results

## 2024-12-16 NOTE — Progress Notes (Signed)
 HEMATOLOGY-ONCOLOGY TELEPHONE VISIT PROGRESS NOTE  I connected with our patient on 12/16/2024 at 11:00 AM EST by telephone and verified that I am speaking with the correct person using two identifiers.  I discussed the limitations, risks, security and privacy concerns of performing an evaluation and management service by telephone and the availability of in person appointments.  I also discussed with the patient that there may be a patient responsible charge related to this service. The patient expressed understanding and agreed to proceed.   History of Present Illness: Follow-up of iron  deficiency anemia  History of Present Illness Paula Benson is a 39 year old female with iron  deficiency anemia who presents for evaluation of persistent fatigue and hemoglobin decline.  She has marked fatigue with insomnia and palpitations described as her heart beating very fast and a sensation of being on a mountain top, consistent with symptomatic anemia.  Her hemoglobin has fallen from 12 to 9.4 g/dL over the past four months, and iron  levels remain low despite two prior doses of ferumoxytol .  Her menses were previously heavy but over the past several months have become irregular and sometimes absent, with no recent heavy bleeding. She describes them as unpredictable and different each month.  She has not had a colonoscopy or endoscopy to evaluate for gastrointestinal blood loss or other sources of iron  deficiency.      REVIEW OF SYSTEMS:   Constitutional: Denies fevers, chills or abnormal weight loss All other systems were reviewed with the patient and are negative. Observations/Objective:     Assessment Plan:  Iron  deficiency anemia (She had a prior history of multiple PEs and had seen Dr. Timmy previously)   IV iron : March 2023, October 2023, 07/07/2023, December 2024, March 2025, August 2025   Labs  02/06/2022: Ferritin 2.6, TIBC 669, there was no hemoglobin available to  review. 02/17/2022: Hemoglobin 7.9, MCV 66, RDW 21.2, platelets 593 06/28/23: Hemoglobin: 10.6, Ferritin: 3.6 10/19/2023: Hemoglobin 11.1, MCV 85.8, B12 159, iron  saturation 3%, ferritin 6 02/02/2024: Hemoglobin 11.5, MCV 87.8, iron  saturation 8%, ferritin 9, B12 189 (ice chip cravings) 04/16/24: Hemoglobin 11.8, MCV 88.8, iron  saturation 25%, B12 240, ferritin 56 05/17/2024: Hemoglobin 12.1, B12 295, ferritin 24, iron  saturation 12% 08/12/2024: Hemoglobin 12.3, MCV 90.5, iron  saturation 22%, ferritin 128 12/10/2024: Hemoglobin 9.4, MCV 87.8, platelets 403, CMP: Normal, iron  saturation 6%, ferritin 9   Cause: No cycles in several months. Recommended Colonoscopy and endoscopy.  unexplained bruising: Workup including platelet function assay: Normal, von Willebrand factor : Normal, factor VIII: 98% von Willebrand factor  antigen: 86%   Multiple problems including benign positional vertigo that comes and goes.   Treatment plan: Recommend IV iron  therapy Also recommended endoscopy and colonoscopy and therefore I will request gastroenterology consultation.  Labs in 3 months and telephone visit after that to discuss results     I discussed the assessment and treatment plan with the patient. The patient was provided an opportunity to ask questions and all were answered. The patient agreed with the plan and demonstrated an understanding of the instructions. The patient was advised to call back or seek an in-person evaluation if the symptoms worsen or if the condition fails to improve as anticipated.   I provided 20 minutes of non-face-to-face time during this encounter.  This includes time for charting and coordination of care   Naomi MARLA Chad, MD

## 2024-12-18 ENCOUNTER — Encounter: Payer: Self-pay | Admitting: Hematology and Oncology

## 2024-12-18 ENCOUNTER — Other Ambulatory Visit (HOSPITAL_COMMUNITY): Payer: Self-pay

## 2024-12-18 ENCOUNTER — Encounter (INDEPENDENT_AMBULATORY_CARE_PROVIDER_SITE_OTHER): Payer: Self-pay | Admitting: Family Medicine

## 2024-12-18 ENCOUNTER — Telehealth: Payer: Self-pay | Admitting: Pharmacy Technician

## 2024-12-18 DIAGNOSIS — D509 Iron deficiency anemia, unspecified: Secondary | ICD-10-CM

## 2024-12-18 DIAGNOSIS — R42 Dizziness and giddiness: Secondary | ICD-10-CM

## 2024-12-18 NOTE — Telephone Encounter (Signed)
 Auth Submission: APPROVED Site of care: Site of care: [facility] Payer: [employer] Medication & CPT/J Code(s) submitted: Feraheme  (ferumoxytol ) [id number] Diagnosis Code:  Route of submission (phone, fax, portal):  Phone # Fax # Auth type: Buy/Bill PB Units/visits requested:  Reference number: [id number] Approval from: 12/16/24 to 06/15/25

## 2024-12-18 NOTE — Telephone Encounter (Signed)

## 2024-12-19 ENCOUNTER — Other Ambulatory Visit (HOSPITAL_COMMUNITY): Payer: Self-pay | Admitting: Hematology and Oncology

## 2024-12-19 ENCOUNTER — Telehealth: Payer: Self-pay | Admitting: Pharmacy Technician

## 2024-12-19 NOTE — Telephone Encounter (Signed)
 Auth Submission: NO AUTH NEEDED Site of care: Site of care: [facility] Payer: [employer] Medication & CPT/J Code(s) submitted: Venofer  (Iron  Sucrose) [id number] Diagnosis Code: D50.9 Route of submission (phone, fax, portal):  Phone # Fax # Auth type: Buy/Bill PB Units/visits requested: 3 DOSES Reference number:  Approval from: 12/19/24 to 04/03/25

## 2024-12-20 ENCOUNTER — Encounter: Payer: Self-pay | Admitting: Hematology and Oncology

## 2024-12-20 ENCOUNTER — Ambulatory Visit

## 2024-12-20 VITALS — BP 119/75 | HR 93 | Temp 98.2°F | Resp 16 | Ht 63.0 in | Wt 185.8 lb

## 2024-12-20 DIAGNOSIS — D509 Iron deficiency anemia, unspecified: Secondary | ICD-10-CM | POA: Diagnosis not present

## 2024-12-20 MED ORDER — DIPHENHYDRAMINE HCL 25 MG PO CAPS
25.0000 mg | ORAL_CAPSULE | Freq: Once | ORAL | Status: AC
  Administered 2024-12-20: 25 mg via ORAL
  Filled 2024-12-20: qty 1

## 2024-12-20 MED ORDER — IRON SUCROSE 300 MG IVPB - SIMPLE MED
300.0000 mg | Freq: Once | Status: AC
  Administered 2024-12-20: 300 mg via INTRAVENOUS
  Filled 2024-12-20: qty 265

## 2024-12-20 MED ORDER — HEPARIN SOD (PORK) LOCK FLUSH 100 UNIT/ML IV SOLN
500.0000 [IU] | Freq: Once | INTRAVENOUS | Status: AC | PRN
  Administered 2024-12-20: 500 [IU]
  Filled 2024-12-20: qty 5

## 2024-12-20 MED ORDER — ACETAMINOPHEN 325 MG PO TABS
650.0000 mg | ORAL_TABLET | Freq: Once | ORAL | Status: AC
  Administered 2024-12-20: 650 mg via ORAL
  Filled 2024-12-20: qty 2

## 2024-12-20 NOTE — Progress Notes (Signed)
 Diagnosis: Iron  Deficiency Anemia  Provider:  Mannam, Praveen MD  Procedure: IV Infusion  IV Type: Port a Cath, IV Location: R Chest  Venofer  (Iron  Sucrose), Dose: 300 mg  Infusion Start Time: 1416 pm  Infusion Stop Time: 1555 pm  Post Infusion IV Care: Observation period completed and Peripheral IV Discontinued  Discharge: Condition: Good, Destination: Home . AVS Declined  Performed by:  Rocky FORBES Search, RN

## 2024-12-27 ENCOUNTER — Ambulatory Visit

## 2024-12-27 VITALS — BP 126/81 | HR 88 | Temp 98.8°F | Resp 16 | Ht 63.0 in | Wt 185.8 lb

## 2024-12-27 DIAGNOSIS — D509 Iron deficiency anemia, unspecified: Secondary | ICD-10-CM

## 2024-12-27 MED ORDER — ACETAMINOPHEN 325 MG PO TABS
650.0000 mg | ORAL_TABLET | Freq: Once | ORAL | Status: AC
  Administered 2024-12-27: 650 mg via ORAL
  Filled 2024-12-27: qty 2

## 2024-12-27 MED ORDER — IRON SUCROSE 300 MG IVPB - SIMPLE MED
300.0000 mg | Freq: Once | Status: AC
  Administered 2024-12-27: 300 mg via INTRAVENOUS

## 2024-12-27 MED ORDER — DIPHENHYDRAMINE HCL 25 MG PO CAPS
25.0000 mg | ORAL_CAPSULE | Freq: Once | ORAL | Status: AC
  Administered 2024-12-27: 25 mg via ORAL
  Filled 2024-12-27: qty 1

## 2024-12-27 MED ORDER — HEPARIN SOD (PORK) LOCK FLUSH 100 UNIT/ML IV SOLN
500.0000 [IU] | Freq: Once | INTRAVENOUS | Status: AC | PRN
  Administered 2024-12-27: 500 [IU]
  Filled 2024-12-27: qty 5

## 2024-12-27 NOTE — Progress Notes (Signed)
 Diagnosis: , Acute Anemia  Provider:  Lonna Coder MD  Procedure: IV Infusion  IV Type: Port a Cath, IV Location: R Chest  , Venofer  (Iron  Sucrose), Dose: 300 mg  Infusion Start Time: 1048  Infusion Stop Time: 1226  Post Infusion IV Care: Port a Cath Deaccessed/Flushed  Discharge: Condition: Good, Destination: Home . AVS Declined  Performed by:  Donny Childes, RN

## 2025-01-03 ENCOUNTER — Ambulatory Visit (INDEPENDENT_AMBULATORY_CARE_PROVIDER_SITE_OTHER)

## 2025-01-03 VITALS — BP 127/83 | HR 91 | Temp 97.8°F | Resp 16 | Ht 63.0 in | Wt 184.4 lb

## 2025-01-03 DIAGNOSIS — D509 Iron deficiency anemia, unspecified: Secondary | ICD-10-CM | POA: Diagnosis not present

## 2025-01-03 MED ORDER — DIPHENHYDRAMINE HCL 25 MG PO CAPS
25.0000 mg | ORAL_CAPSULE | Freq: Once | ORAL | Status: AC
  Administered 2025-01-03: 25 mg via ORAL
  Filled 2025-01-03: qty 1

## 2025-01-03 MED ORDER — ACETAMINOPHEN 325 MG PO TABS
650.0000 mg | ORAL_TABLET | Freq: Once | ORAL | Status: AC
  Administered 2025-01-03: 650 mg via ORAL
  Filled 2025-01-03: qty 2

## 2025-01-03 MED ORDER — HEPARIN SOD (PORK) LOCK FLUSH 100 UNIT/ML IV SOLN
500.0000 [IU] | Freq: Once | INTRAVENOUS | Status: AC | PRN
  Administered 2025-01-03: 500 [IU]
  Filled 2025-01-03: qty 5

## 2025-01-03 MED ORDER — IRON SUCROSE 300 MG IVPB - SIMPLE MED
300.0000 mg | Freq: Once | Status: AC
  Administered 2025-01-03: 300 mg via INTRAVENOUS

## 2025-01-03 NOTE — Progress Notes (Signed)
 Diagnosis: Acute Anemia  Provider:  Mannam, Praveen MD  Procedure: IV Infusion  IV Type: Port a Cath, IV Location: R Chest  Venofer  (Iron  Sucrose), Dose: 300 mg  Infusion Start Time: 1046  Infusion Stop Time: 1222  Post Infusion IV Care: Patient declined observation and Port a Cath Deaccessed/Flushed  Discharge: Condition: Good, Destination: Home . AVS Declined  Performed by:  Donny Childes, RN

## 2025-01-10 ENCOUNTER — Encounter: Payer: Self-pay | Admitting: Family Medicine

## 2025-01-10 DIAGNOSIS — G4452 New daily persistent headache (NDPH): Secondary | ICD-10-CM

## 2025-01-10 MED ORDER — NURTEC 75 MG PO TBDP: ORAL_TABLET | ORAL | 2 refills | Status: AC

## 2025-01-10 NOTE — Telephone Encounter (Signed)
Don't see this on her med list 

## 2025-03-17 ENCOUNTER — Inpatient Hospital Stay

## 2025-03-19 ENCOUNTER — Inpatient Hospital Stay: Admitting: Hematology and Oncology
